# Patient Record
Sex: Female | Born: 1955 | Race: White | Hispanic: No | Marital: Married | Smoking: Current some day smoker
Health system: Southern US, Community
[De-identification: ages and names within clinical notes are randomized; demographics above are authoritative.]

## PROBLEM LIST (undated history)

## (undated) DIAGNOSIS — E039 Hypothyroidism, unspecified: Secondary | ICD-10-CM

## (undated) DIAGNOSIS — K219 Gastro-esophageal reflux disease without esophagitis: Secondary | ICD-10-CM

## (undated) DIAGNOSIS — N3289 Other specified disorders of bladder: Secondary | ICD-10-CM

## (undated) DIAGNOSIS — M810 Age-related osteoporosis without current pathological fracture: Secondary | ICD-10-CM

## (undated) DIAGNOSIS — L4050 Arthropathic psoriasis, unspecified: Secondary | ICD-10-CM

## (undated) DIAGNOSIS — Z973 Presence of spectacles and contact lenses: Secondary | ICD-10-CM

## (undated) DIAGNOSIS — E785 Hyperlipidemia, unspecified: Secondary | ICD-10-CM

## (undated) DIAGNOSIS — R011 Cardiac murmur, unspecified: Secondary | ICD-10-CM

## (undated) DIAGNOSIS — Z8489 Family history of other specified conditions: Secondary | ICD-10-CM

## (undated) DIAGNOSIS — K5730 Diverticulosis of large intestine without perforation or abscess without bleeding: Secondary | ICD-10-CM

## (undated) DIAGNOSIS — I350 Nonrheumatic aortic (valve) stenosis: Secondary | ICD-10-CM

## (undated) DIAGNOSIS — R87619 Unspecified abnormal cytological findings in specimens from cervix uteri: Secondary | ICD-10-CM

## (undated) DIAGNOSIS — Z86010 Personal history of colonic polyps: Secondary | ICD-10-CM

## (undated) DIAGNOSIS — D649 Anemia, unspecified: Secondary | ICD-10-CM

## (undated) DIAGNOSIS — L409 Psoriasis, unspecified: Secondary | ICD-10-CM

## (undated) DIAGNOSIS — I10 Essential (primary) hypertension: Secondary | ICD-10-CM

## (undated) DIAGNOSIS — Z860101 Personal history of adenomatous and serrated colon polyps: Secondary | ICD-10-CM

## (undated) DIAGNOSIS — T7840XA Allergy, unspecified, initial encounter: Secondary | ICD-10-CM

## (undated) DIAGNOSIS — M1990 Unspecified osteoarthritis, unspecified site: Secondary | ICD-10-CM

## (undated) DIAGNOSIS — B977 Papillomavirus as the cause of diseases classified elsewhere: Secondary | ICD-10-CM

## (undated) DIAGNOSIS — H269 Unspecified cataract: Secondary | ICD-10-CM

## (undated) HISTORY — PX: ABDOMINAL HYSTERECTOMY: SHX81

## (undated) HISTORY — PX: DENTAL SURGERY: SHX609

## (undated) HISTORY — DX: Diverticulosis of large intestine without perforation or abscess without bleeding: K57.30

## (undated) HISTORY — PX: CATARACT EXTRACTION, BILATERAL: SHX1313

## (undated) HISTORY — PX: KNEE ARTHROPLASTY: SHX992

## (undated) HISTORY — DX: Other specified disorders of bladder: N32.89

## (undated) HISTORY — PX: POLYPECTOMY: SHX149

## (undated) HISTORY — DX: Hyperlipidemia, unspecified: E78.5

## (undated) HISTORY — DX: Arthropathic psoriasis, unspecified: L40.50

## (undated) HISTORY — DX: Hypothyroidism, unspecified: E03.9

## (undated) HISTORY — DX: Age-related osteoporosis without current pathological fracture: M81.0

## (undated) HISTORY — DX: Allergy, unspecified, initial encounter: T78.40XA

## (undated) HISTORY — DX: Unspecified cataract: H26.9

---

## 1998-02-22 ENCOUNTER — Emergency Department (HOSPITAL_COMMUNITY): Admission: EM | Admit: 1998-02-22 | Discharge: 1998-02-22 | Payer: Self-pay | Admitting: Internal Medicine

## 1998-02-22 ENCOUNTER — Encounter: Payer: Self-pay | Admitting: Internal Medicine

## 1998-10-03 ENCOUNTER — Emergency Department (HOSPITAL_COMMUNITY): Admission: EM | Admit: 1998-10-03 | Discharge: 1998-10-03 | Payer: Self-pay | Admitting: Emergency Medicine

## 2000-01-12 ENCOUNTER — Emergency Department (HOSPITAL_COMMUNITY): Admission: EM | Admit: 2000-01-12 | Discharge: 2000-01-12 | Payer: Self-pay | Admitting: *Deleted

## 2007-09-17 ENCOUNTER — Emergency Department (HOSPITAL_COMMUNITY): Admission: EM | Admit: 2007-09-17 | Discharge: 2007-09-18 | Payer: Self-pay | Admitting: Emergency Medicine

## 2008-03-20 ENCOUNTER — Emergency Department (HOSPITAL_COMMUNITY): Admission: EM | Admit: 2008-03-20 | Discharge: 2008-03-21 | Payer: Self-pay | Admitting: Emergency Medicine

## 2008-04-04 ENCOUNTER — Ambulatory Visit (HOSPITAL_COMMUNITY): Admission: RE | Admit: 2008-04-04 | Discharge: 2008-04-04 | Payer: Self-pay | Admitting: Obstetrics and Gynecology

## 2008-04-04 ENCOUNTER — Encounter (INDEPENDENT_AMBULATORY_CARE_PROVIDER_SITE_OTHER): Payer: Self-pay | Admitting: Obstetrics and Gynecology

## 2008-04-04 HISTORY — PX: HYSTEROSCOPY WITH D & C: SHX1775

## 2009-08-16 ENCOUNTER — Emergency Department (HOSPITAL_COMMUNITY): Admission: EM | Admit: 2009-08-16 | Discharge: 2009-08-17 | Payer: Self-pay | Admitting: Emergency Medicine

## 2009-08-17 ENCOUNTER — Emergency Department (HOSPITAL_COMMUNITY): Admission: EM | Admit: 2009-08-17 | Discharge: 2009-08-18 | Payer: Self-pay | Admitting: Emergency Medicine

## 2009-09-16 ENCOUNTER — Encounter (INDEPENDENT_AMBULATORY_CARE_PROVIDER_SITE_OTHER): Payer: Self-pay | Admitting: *Deleted

## 2009-09-16 ENCOUNTER — Ambulatory Visit: Payer: Self-pay | Admitting: Family Medicine

## 2009-09-26 ENCOUNTER — Encounter: Admission: RE | Admit: 2009-09-26 | Discharge: 2009-09-26 | Payer: Self-pay | Admitting: Family Medicine

## 2009-10-09 ENCOUNTER — Encounter (INDEPENDENT_AMBULATORY_CARE_PROVIDER_SITE_OTHER): Payer: Self-pay | Admitting: *Deleted

## 2009-10-10 ENCOUNTER — Ambulatory Visit: Payer: Self-pay | Admitting: Gastroenterology

## 2009-11-06 ENCOUNTER — Ambulatory Visit: Payer: Self-pay | Admitting: Gastroenterology

## 2009-11-06 HISTORY — PX: COLONOSCOPY: SHX174

## 2009-11-08 ENCOUNTER — Encounter: Payer: Self-pay | Admitting: Gastroenterology

## 2009-12-27 ENCOUNTER — Ambulatory Visit (HOSPITAL_COMMUNITY)
Admission: RE | Admit: 2009-12-27 | Discharge: 2009-12-27 | Payer: Self-pay | Source: Home / Self Care | Attending: Neurological Surgery | Admitting: Neurological Surgery

## 2009-12-27 HISTORY — PX: PERONEAL NERVE DECOMPRESSION: SHX2226

## 2010-02-18 NOTE — Letter (Signed)
Summary: Patient Notice- Polyp Results  [facility]  [address] [place], [place] [address]   Phone: [phone]  Fax: [phone]        November 08, 2009 MRN: [id number]    April Cox [address] [place], [place]  [address]    Dear [name],  I am pleased to inform you that the colon polyp(s) removed during your recent colonoscopy was (were) found to be benign (no cancer detected) upon pathologic examination.  I recommend you have a repeat colonoscopy examination in 3_ years to look for recurrent polyps, as having colon polyps increases your risk for having recurrent polyps or even colon cancer in the future.  Should you develop new or worsening symptoms of abdominal pain, bowel habit changes or bleeding from the rectum or bowels, please schedule an evaluation with either your primary care physician or with me.  Additional information/recommendations:  _X_ No further action with gastroenterology is needed at this time. Please      follow-up with your primary care physician for your other healthcare      needs.  __ Please call [phone] to schedule a return visit to review your      situation.  __ Please keep your follow-up visit as already scheduled.  __ Continue treatment plan as outlined the day of your exam.  Please call us if you are having persistent problems or have questions about your condition that have not been fully answered at this time.  Sincerely,  Mardella Layman MD [facility]  This letter has been electronically signed by your physician.  Appended Document: Patient Notice- Polyp Results letter mailed

## 2010-02-18 NOTE — Procedures (Signed)
Summary: Colonoscopy  Patient: April Cox Note: All result statuses are Final unless otherwise noted.  Tests: (1) Colonoscopy (COL)   COL Colonoscopy           DONE     [facility]     [address]     [place], [place]  [address]           COLONOSCOPY PROCEDURE REPORT           PATIENT:  April Cox, April Cox  MR#:  [id number]     BIRTHDATE:  29-Mar-1955, 54 yrs. old  GENDER:  female     ENDOSCOPIST:  Vania Rea. Jarold Motto, MD, [facility]     REF. BY:     PROCEDURE DATE:  11/06/2009     PROCEDURE:  Colonoscopy with snare polypectomy     ASA CLASS:  Class II     INDICATIONS:  Routine Risk Screening     MEDICATIONS:   Fentanyl 75 mcg IV, Versed 6 mg IV           DESCRIPTION OF PROCEDURE:   After the risks benefits and     alternatives of the procedure were thoroughly explained, informed     consent was obtained.  Digital rectal exam was performed and     revealed no abnormalities.   The [id number] [id number] endoscope was     introduced through the anus and advanced to the cecum, which was     identified by both the appendix and ileocecal valve, without     limitations.  The quality of the prep was excellent, using     MoviPrep.  The instrument was then slowly withdrawn as the colon     was fully examined.     <<PROCEDUREIMAGES>>           FINDINGS:  ULTRASONIC FINDINGS:   A pedunculated polyp was found.     cm stalked polyp hot snare removed from proximal sigmoid ares.#2.     A sessile polyp was found. 5mm sessile polyp at hepatic flexure     removed.#1.  A sessile polyp was found. 3mm distal sigmoid polyp     snare removed.  Moderate diverticulosis was found. thickened,red     haustral folds noted.  This was otherwise a normal examination of     the colon.   Retroflexed views in the rectum revealed no     abnormalities.    The scope was then withdrawn from the patient     and the procedure completed.           COMPLICATIONS:  None     ENDOSCOPIC IMPRESSION:     1) Sessile polyp  2) Pedunculated polyp     3) Sessile polyp     4) Moderate diverticulosis     5) Otherwise normal examination     R/O ADENOMAS.     RECOMMENDATIONS:     1) high fiber diet     2) Repeat Colonoscopy in 3 years.     REPEAT EXAM:  No           ______________________________     Vania Rea. Jarold Motto, MD, [facility]           CC:           n.     eSIGNED:   Vania Rea. Patterson at 11/06/2009 02:08 PM           April Cox, [id number]  Note: An exclamation mark (!) indicates a result that was  not dispersed into the flowsheet. Document Creation Date: 11/06/2009 2:08 PM _______________________________________________________________________  (1) Order result status: Final Collection or observation date-time: 11/06/2009 14:01 Requested date-time:  Receipt date-time:  Reported date-time:  Referring Physician:   Ordering Physician: Sheryn Bison [phone]) Specimen Source:  Source: Launa Grill Order Number: [id number] Lab site:   Appended Document: Colonoscopy 3Y F/U  Appended Document: Colonoscopy     Procedures Next Due Date:    Colonoscopy: 10/2012

## 2010-02-18 NOTE — Miscellaneous (Signed)
Summary: [facility] PREVISIT/PREP  Clinical Lists Changes  Medications: Added new medication of MOVIPREP 100 GM  SOLR (PEG-KCL-NACL-NASULF-NA ASC-C) As per prep instructions. - Signed Rx of MOVIPREP 100 GM  SOLR (PEG-KCL-NACL-NASULF-NA ASC-C) As per prep instructions.;  #1 x 0;  Signed;  Entered by: Wyona Almas RN;  Authorized by: Mardella Layman MD [facility];  Method used: Electronically to [facility] [address], [place], [place], [place]  [address], Ph: [phone], Fax: [phone] Allergies: Added new allergy or adverse reaction of PENICILLIN Added new allergy or adverse reaction of CODEINE Observations: Added new observation of NKA: F (10/10/2009 9:50)    Prescriptions: MOVIPREP 100 GM  SOLR (PEG-KCL-NACL-NASULF-NA ASC-C) As per prep instructions.  #1 x 0   Entered by:   Wyona Almas RN   Authorized by:   Mardella Layman MD [facility]   Signed by:   Wyona Almas RN on 10/10/2009   Method used:   Electronically to        [facility]       [address]       [place], [place]  [address]       Ph: [phone]       Fax: [phone]   RxID:   [id number]

## 2010-02-18 NOTE — Letter (Signed)
Summary: [facility] Instructions  [facility]  [address] [place], [place] [address]   Phone: [phone]  Fax: [phone]       April Cox    06/04/1955    MRN: [id number]        Procedure Day Dorna Bloom:  [facility]  11/06/09     Arrival Time:  12:30PM     Procedure Time:  1:30PM     Location of Procedure:                    _X _   Endoscopy Center (4th Floor)                       PREPARATION FOR COLONOSCOPY WITH MOVIPREP   Starting 5 days prior to your procedure 11/01/09 do not eat nuts, seeds, popcorn, corn, beans, peas,  salads, or any raw vegetables.  Do not take any fiber supplements (e.g. Metamucil, Citrucel, and Benefiber).  THE DAY BEFORE YOUR PROCEDURE         DATE: 11/05/09   DAY: TUESDAY  1.  Drink clear liquids the entire day-NO SOLID FOOD  2.  Do not drink anything colored red or purple.  Avoid juices with pulp.  No orange juice.  3.  Drink at least 64 oz. (8 glasses) of fluid/clear liquids during the day to prevent dehydration and help the prep work efficiently.  CLEAR LIQUIDS INCLUDE: Water Jello Ice Popsicles Tea (sugar ok, no milk/cream) Powdered fruit flavored drinks Coffee (sugar ok, no milk/cream) Gatorade Juice: apple, white grape, white cranberry  Lemonade Clear bullion, consomm, broth Carbonated beverages (any kind) Strained chicken noodle soup Hard Candy                             4.  In the morning, mix first dose of MoviPrep solution:    Empty 1 Pouch A and 1 Pouch B into the disposable container    Add lukewarm drinking water to the top line of the container. Mix to dissolve    Refrigerate (mixed solution should be used within 24 hrs)  5.  Begin drinking the prep at 5:00 p.m. The MoviPrep container is divided by 4 marks.   Every 15 minutes drink the solution down to the next mark (approximately 8 oz) until the full liter is complete.   6.  Follow completed prep with 16 oz of clear liquid of your choice  (Nothing red or purple).  Continue to drink clear liquids until bedtime.  7.  Before going to bed, mix second dose of MoviPrep solution:    Empty 1 Pouch A and 1 Pouch B into the disposable container    Add lukewarm drinking water to the top line of the container. Mix to dissolve    Refrigerate  THE DAY OF YOUR PROCEDURE      DATE: 11/06/09  DAY: WEDNESDAY  Beginning at 8:30AM (5 hours before procedure):         1. Every 15 minutes, drink the solution down to the next mark (approx 8 oz) until the full liter is complete.  2. Follow completed prep with 16 oz. of clear liquid of your choice.    3. You may drink clear liquids until 11:30AM (2 HOURS BEFORE PROCEDURE).   MEDICATION INSTRUCTIONS  Unless otherwise instructed, you should take regular prescription medications with a small sip of water   as early as possible the morning  of your procedure.        OTHER INSTRUCTIONS  You will need a responsible adult at least 57 years of age to accompany you and drive you home.   This person must remain in the waiting room during your procedure.  Wear loose fitting clothing that is easily removed.  Leave jewelry and other valuables at home.  However, you may wish to bring a book to read or  an iPod/MP3 player to listen to music as you wait for your procedure to start.  Remove all body piercing jewelry and leave at home.  Total time from sign-in until discharge is approximately 2-3 hours.  You should go home directly after your procedure and rest.  You can resume normal activities the  day after your procedure.  The day of your procedure you should not:   Drive   Make legal decisions   Operate machinery   Drink alcohol   Return to work  You will receive specific instructions about eating, activities and medications before you leave.    The above instructions have been reviewed and explained to me by   Wyona Almas RN  October 10, 2009 10:20 AM     I fully  understand and can verbalize these instructions _____________________________ Date _________

## 2010-02-18 NOTE — Letter (Signed)
Summary: Previsit letter  [facility]  [address] [place], [place] [address]   Phone: [phone]  Fax: [phone]       09/16/2009 MRN: [id number]  April Cox [address] [place], [place]  [address]  Dear [name],  Welcome to the Gastroenterology Division at [facility].    You are scheduled to see a nurse for your pre-procedure visit on 09/19/2009 at 3:30PM on the 3rd floor at [facility], [address]  We ask that you try to arrive at our office 15 minutes prior to your appointment time to allow for check-in.  Your nurse visit will consist of discussing your medical and surgical history, your immediate family medical history, and your medications.    Please bring a complete list of all your medications or, if you prefer, bring the medication bottles and we will list them.  We will need to be aware of both prescribed and over the counter drugs.  We will need to know exact dosage information as well.  If you are on blood thinners (Coumadin, Plavix, Aggrenox, Ticlid, etc.) please call our office today/prior to your appointment, as we need to consult with your physician about holding your medication.   Please be prepared to read and sign documents such as consent forms, a financial agreement, and acknowledgement forms.  If necessary, and with your consent, a friend or relative is welcome to sit-in on the nurse visit with you.  Please bring your insurance card so that we may make a copy of it.  If your insurance requires a referral to see a specialist, please bring your referral form from your primary care physician.  No co-pay is required for this nurse visit.     If you cannot keep your appointment, please call [phone] to cancel or reschedule prior to your appointment date.  This allows [place] the opportunity to schedule an appointment for another patient in need of care.    Thank you for choosing [facility] for your medical needs.   We appreciate the opportunity to care for you.  Please visit [place] at our website  to learn more about our practice.                     Sincerely.                                                                                                                   The Gastroenterology Division

## 2010-04-01 LAB — DIFFERENTIAL
Basophils Absolute: 0 10*3/uL (ref 0.0–0.1)
Lymphocytes Relative: 28 % (ref 12–46)
Monocytes Absolute: 0.4 10*3/uL (ref 0.1–1.0)
Monocytes Relative: 3 % (ref 3–12)
Neutro Abs: 6.9 10*3/uL (ref 1.7–7.7)
Neutrophils Relative %: 67 % (ref 43–77)

## 2010-04-01 LAB — BASIC METABOLIC PANEL
GFR calc non Af Amer: >60 mL/min (ref >60)
Potassium: 4.8 mEq/L (ref 3.5–5.1)
Sodium: 137 mEq/L (ref 135–145)

## 2010-04-01 LAB — CBC
HCT: 46.4 % — ABNORMAL HIGH (ref 36.0–46.0)
Hemoglobin: 16.6 g/dL — ABNORMAL HIGH (ref 12.0–15.0)
RDW: 11.8 % (ref 11.5–15.5)
WBC: 10.3 10*3/uL (ref 4.0–10.5)

## 2010-04-01 LAB — PROTIME-INR: INR: 0.87 (ref 0.00–1.49)

## 2010-04-01 LAB — SURGICAL PCR SCREEN: Staphylococcus aureus: POSITIVE — AB

## 2010-04-01 LAB — APTT: aPTT: 35 seconds (ref 24–37)

## 2010-05-01 LAB — DIFFERENTIAL
Basophils Absolute: 0.1 10*3/uL (ref 0.0–0.1)
Basophils Relative: 2 % — ABNORMAL HIGH (ref 0–1)
Eosinophils Relative: 2 % (ref 0–5)
Lymphocytes Relative: 36 % (ref 12–46)
Monocytes Absolute: 0.3 10*3/uL (ref 0.1–1.0)
Neutro Abs: 4.8 10*3/uL (ref 1.7–7.7)

## 2010-05-01 LAB — URINE MICROSCOPIC-ADD ON

## 2010-05-01 LAB — COMPREHENSIVE METABOLIC PANEL
ALT: 13 U/L (ref 0–35)
AST: 16 U/L (ref 0–37)
Albumin: 4 g/dL (ref 3.5–5.2)
CO2: 22 mEq/L (ref 19–32)
Calcium: 9.1 mg/dL (ref 8.4–10.5)
GFR calc Af Amer: >60 mL/min (ref >60)
GFR calc non Af Amer: >60 mL/min (ref >60)
Sodium: 134 mEq/L — ABNORMAL LOW (ref 135–145)
Total Protein: 7 g/dL (ref 6.0–8.3)

## 2010-05-01 LAB — CBC
HCT: 26.4 % — ABNORMAL LOW (ref 36.0–46.0)
Hemoglobin: 8.7 g/dL — ABNORMAL LOW (ref 12.0–15.0)
MCHC: 32.8 g/dL (ref 30.0–36.0)
MCHC: 32.9 g/dL (ref 30.0–36.0)
Platelets: 317 10*3/uL (ref 150–400)
Platelets: 282 10*3/uL (ref 150–400)
RBC: 3.36 MIL/uL — ABNORMAL LOW (ref 3.87–5.11)
RDW: 15.9 % — ABNORMAL HIGH (ref 11.5–15.5)

## 2010-05-01 LAB — BASIC METABOLIC PANEL
BUN: 5 mg/dL — ABNORMAL LOW (ref 6–23)
CO2: 22 mEq/L (ref 19–32)
Calcium: 8.8 mg/dL (ref 8.4–10.5)
GFR calc non Af Amer: >60 mL/min (ref >60)
Glucose, Bld: 107 mg/dL — ABNORMAL HIGH (ref 70–99)

## 2010-05-01 LAB — URINALYSIS, ROUTINE W REFLEX MICROSCOPIC
Leukocytes, UA: NEGATIVE
Nitrite: NEGATIVE
Specific Gravity, Urine: >1.03 — ABNORMAL HIGH (ref 1.005–1.030)
Urobilinogen, UA: 0.2 mg/dL (ref 0.0–1.0)
pH: 5.5 (ref 5.0–8.0)

## 2010-05-01 LAB — SAMPLE TO BLOOD BANK

## 2010-05-01 LAB — T4, FREE: Free T4: 0.99 ng/dL (ref 0.89–1.80)

## 2010-05-01 LAB — TSH: TSH: 4.362 u[IU]/mL (ref 0.350–4.500)

## 2010-06-03 NOTE — Op Note (Signed)
April Cox, April Cox                ACCOUNT NO.:  [id number]   MEDICAL RECORD NO.:  [id number]          PATIENT TYPE:  AMB   LOCATION:  SDC                           FACILITY:  [facility]   PHYSICIAN:  Crist Fat. Rivard, M.D. DATE OF BIRTH:  11/07/1955   DATE OF PROCEDURE:  04/04/2008  DATE OF DISCHARGE:                               OPERATIVE REPORT   PREOPERATIVE DIAGNOSES:  Menorrhagia, anemia, endometrial hyperplasia.   POSTOPERATIVE DIAGNOSES:  Menorrhagia, anemia, endometrial hyperplasia.   ANESTHESIA:  General, Dr. Jean Rosenthal.   PROCEDURE:  D&C with hysteroscopy.   SURGEON:  Crist Fat. Rivard, MD   ESTIMATED BLOOD LOSS:  Minimal.   PROCEDURE:  After being informed of the planned procedure with possible  complications including bleeding, infection, and injury to uterus,  informed consent was obtained.  The patient was taken to OR #8, given  general anesthesia with laryngeal mask ventilation without complication.  She was placed in the lithotomy position, prepped, and draped in a  sterile fashion.  Her bladder was emptied with an in-and-out red rubber  catheter.  Pelvic exam reveals an anteverted uterus normal in size and  shape, and two normal adnexa.  A weighted speculum was inserted.  Anterior lip of the cervix was grasped with a tenaculum forceps and the  uterus was sounded at 9 cm.  The cervix was easily dilated using Hegar  dilator until #29 which allows [place] to proceed with the curettage of the  endometrial cavity using a medium size curette.  This was done in  multiple passes leading to the removal of a significant amount of  endometrium.  At the end of the curettage, the endometrial cavity  appears free of tissue and there was no excessive bleeding, so we went  ahead and proceeded with hysteroscopy to visualize the endometrial  cavity.  The diagnostic hysteroscope was easily inserted in the cavity  and with perfusion of the sorbitol at a maximum pressure of 70 mmHg, we  were  able to visualize the endometrial cavity which revealed some  residual fluffiness from endometrial remnants.  No evidence of polyp and  no evidence of other endometrial pathology.  To note, there was no  evidence of active bleeding either.   We removed the hysteroscope.  To be noted, we needed to clamp the cervix  down around the hysteroscope to maintain some kind of endometrial  pressure with two tenaculum forceps.   The instruments were removed and bleeding from the tenaculum forceps was  controlled with application of silver nitrate and pressure applied to  the cervix.  Instruments and sponge count is complete x2.  Estimated  blood loss was  minimal, fluid deficit 0, and the procedure was very well tolerated by  the patient who was taken to recovery room in a well and stable  condition.   SPECIMEN:  Endometrial curettings sent to Pathology.      Crist Fat Rivard, M.D.  Electronically Signed     SAR/MEDQ  D:  04/04/2008  T:  04/05/2008  Job:  [id number]

## 2010-06-03 NOTE — H&P (Signed)
NAMEMEGGAN, April Cox                ACCOUNT NO.:  [id number]   MEDICAL RECORD NO.:  [id number]           PATIENT TYPE:   LOCATION:                                 FACILITY:   PHYSICIAN:  Janine Limbo, M.D.DATE OF BIRTH:  1955-10-18   DATE OF ADMISSION:  04/04/2008  DATE OF DISCHARGE:                              HISTORY & PHYSICAL   HISTORY OF PRESENT ILLNESS:  April Cox is a 55 year old female, para 1-  0-1-1, who presents for dilatation and curettage and possible  hysteroscopy.  The patient has been followed at the [facility].  The patient  complains of heavy and irregular menstrual periods.  The patient reports  that she had normal cycles for 2-3 months prior to this episode but that  since February 2010, her cycles have been extremely heavy.  She was  evaluated by her primary physician and at the emergency department at  [facility].  Her hemoglobin was noted to be 8.7.  She has had  episodes of dizziness in the past and she does feel weak.  The patient  had an endometrial biopsy performed in March 2010 that showed simple  hyperplasia.  An ultrasound was performed that showed a uterus that  measured 10.9 cm x 7.9 cm.  The endometrium measured 1.34 cm.  The  ovaries appeared normal.  A hydrosonogram was performed which showed  three endometrial masses, consistent with endometrial polyps.  The  largest polyp measured 2.04 x 0.95 cm.  Her prolactin was within normal  limits.  Her TSH was 4.957 (slightly high).  The patient's husband has  had a vasectomy.  The patient has had a dilatation and curettage in the  past.  Her most recent Pap smear was within normal limits.   OBSTETRICAL HISTORY:  The patient has had one full-term vaginal  delivery.  She has had one miscarriage.   PAST MEDICAL HISTORY:  The patient had gestational hypertension.  She  has a known history of anemia, as mentioned in the history of  present  illness.  She has had dental work, but no other surgeries.   SOCIAL HISTORY:  The patient has been married for the past year.  She  smokes one half pack of cigarettes each day.  She drinks alcohol  socially.  She denies other recreational drug uses.   REVIEW OF SYSTEMS:  Please see history of present illness.  The patient  denies nausea, vomiting, chills and fever.   FAMILY HISTORY:  There is a family history of hypothyroidism,  hypertension, and kidney disorders.   PHYSICAL EXAM:  Weight is 174 pounds.  Height is 5 feet 6 inches.  HEENT:  Within normal limits.  CHEST:  Clear.  HEART:  Regular rate and rhythm.  ABDOMEN:  Soft and nontender.  There is no guarding or rebound.  EXTREMITIES:  Grossly normal.  NEUROLOGIC EXAM:  Grossly normal.  PELVIC EXAM:  External genitalia is normal.  Vagina is normal.  There is  some relaxation present.  Cervix is nontender.  Uterus is  normal size,  shape and consistency.  The uterus is nontender.  Adnexa:  No masses are  appreciated.   ASSESSMENT:  1. Menometrorrhagia.  2. Severe anemia.  3. Hypothyroidism.  4. Dysmenorrhea.  5. Endometrial polyps.   PLAN:  1. The patient will undergo a dilatation and curettage.  We will      perform hysteroscopy, if possible.  The patient understands the      indications for surgical procedure as well as the alternative      treatment options.  We discussed progestin therapy, but the patient      feels that her bleeding has progressed to the point that she does      not want to wait for that.  She accepts the risks of, but not      limited to, anesthetic complications, bleeding, infection, and      possible damage to surrounding organs.  2. I have called prescriptions for Motrin 800 mg every 8 hours as      needed for moderate pain to [facility] on [address].  I      have also called in prescriptions for Provera 10 mg tablets 2      tablets each day.  I have called in prescription for  Synthroid 25      mcg each day.  3. The patient will return to see Dr. Stefano Gaul or Dr. Estanislado Pandy in 1-2      weeks for follow-up examination.  4. Preoperative orders have been called to the [facility]      [place], including a complete hypothyroid panel, a comprehensive      metabolic panel, and a CBC.      Janine Limbo, M.D.  Electronically Signed     AVS/MEDQ  D:  04/04/2008  T:  04/04/2008  Job:  [id number]

## 2010-08-01 ENCOUNTER — Other Ambulatory Visit (INDEPENDENT_AMBULATORY_CARE_PROVIDER_SITE_OTHER): Payer: BLUE CROSS/BLUE SHIELD

## 2010-08-01 DIAGNOSIS — Z299 Encounter for prophylactic measures, unspecified: Secondary | ICD-10-CM

## 2010-08-01 DIAGNOSIS — Z23 Encounter for immunization: Secondary | ICD-10-CM

## 2010-08-20 ENCOUNTER — Encounter: Payer: Self-pay | Admitting: Family Medicine

## 2010-08-22 ENCOUNTER — Ambulatory Visit (INDEPENDENT_AMBULATORY_CARE_PROVIDER_SITE_OTHER): Payer: BLUE CROSS/BLUE SHIELD | Admitting: Medical

## 2010-08-22 ENCOUNTER — Encounter: Payer: Self-pay | Admitting: Medical

## 2010-08-22 DIAGNOSIS — L4050 Arthropathic psoriasis, unspecified: Secondary | ICD-10-CM | POA: Insufficient documentation

## 2010-08-22 DIAGNOSIS — Z0000 Encounter for general adult medical examination without abnormal findings: Secondary | ICD-10-CM

## 2010-08-22 DIAGNOSIS — E039 Hypothyroidism, unspecified: Secondary | ICD-10-CM | POA: Insufficient documentation

## 2010-08-22 DIAGNOSIS — R011 Cardiac murmur, unspecified: Secondary | ICD-10-CM

## 2010-08-22 DIAGNOSIS — E785 Hyperlipidemia, unspecified: Secondary | ICD-10-CM | POA: Insufficient documentation

## 2010-08-22 DIAGNOSIS — F17200 Nicotine dependence, unspecified, uncomplicated: Secondary | ICD-10-CM

## 2010-08-22 LAB — LIPID PANEL
LDL Cholesterol: 119 mg/dL — ABNORMAL HIGH (ref 0–99)
Triglycerides: 155 mg/dL — ABNORMAL HIGH (ref <150)
VLDL: 31 mg/dL (ref 0–40)

## 2010-08-22 NOTE — Patient Instructions (Signed)
Recheck here in 4-6 months on heart murmur.  We will plan to do an ultrasound of the heart within the next 6 months.  We will request records from your gynecologist and rheumatologists.  Stop smoking!  Preventative Care for Adults - Female      MAINTAIN REGULAR HEALTH EXAMS:  A routine yearly physical is a good way to check in with your primary care provider about your health and preventive screening. It is also an opportunity to share updates about your health and any concerns you have, and receive a thorough all-over exam.   Most health insurance companies pay for at least some preventative services.  Check with your health plan for specific coverages.  WHAT PREVENTATIVE SERVICES DO WOMEN NEED?  Adult women should have their weight and blood pressure checked regularly.   Women age 50 and older should have their cholesterol levels checked regularly.  Women should be screened for cervical cancer with a Pap smear and pelvic exam beginning at either age 16, or 3 years after they become sexually activity.    Breast cancer screening generally begins at age 25 with a mammogram and breast exam by your primary care provider.    Beginning at age 83 and continuing to age 50, women should be screened for colorectal cancer.  Certain people may need continued testing until age 32.  Updating vaccinations is part of preventative care.  Vaccinations help protect against diseases such as the flu.  Osteoporosis is a disease in which the bones lose minerals and strength as we age. Women ages 47 and over should discuss this with their caregivers, as should women after menopause who have other risk factors.  Lab tests are generally done as part of preventative care to screen for anemia and blood disorders, to screen for problems with the kidneys and liver, to screen for bladder problems, to check blood sugar, and to check your cholesterol level.  Preventative services generally include counseling about  diet, exercise, avoiding tobacco, drugs, excessive alcohol consumption, and sexually transmitted infections.    GENERAL RECOMMENDATIONS FOR GOOD HEALTH:  Healthy diet:  Eat a variety of foods, including fruit, vegetables, animal or vegetable protein, such as meat, fish, chicken, and eggs, or beans, lentils, tofu, and grains, such as rice.  Drink plenty of water daily.  Decrease saturated fat in the diet, avoid lots of red meat, processed foods, sweets, fast foods, and fried foods.  Exercise:  Aerobic exercise helps maintain good heart health. At least 30-40 minutes of moderate-intensity exercise is recommended. For example, a brisk walk that increases your heart rate and breathing. This should be done on most days of the week.   Find a type of exercise or a variety of exercises that you enjoy so that it becomes a part of your daily life.  Examples are running, walking, swimming, water aerobics, and biking.  For motivation and support, explore group exercise such as aerobic class, spin class, Zumba, Yoga,or  martial arts, etc.    Set exercise goals for yourself, such as a certain weight goal, walk or run in a race such as a 5k walk/run.  Speak to your primary care provider about exercise goals.  Disease prevention:  If you smoke or chew tobacco, find out from your caregiver how to quit. It can literally save your life, no matter how long you have been a tobacco user. If you do not use tobacco, never begin.   Maintain a healthy diet and normal weight. Increased weight leads  to problems with blood pressure and diabetes.   The Body Mass Index or BMI is a way of measuring how much of your body is fat. Having a BMI above 27 increases the risk of heart disease, diabetes, hypertension, stroke and other problems related to obesity. Your caregiver can help determine your BMI and based on it develop an exercise and dietary program to help you achieve or maintain this important measurement at a  healthful level.  High blood pressure causes heart and blood vessel problems.  Persistent high blood pressure should be treated with medicine if weight loss and exercise do not work.   Fat and cholesterol leaves deposits in your arteries that can block them. This causes heart disease and vessel disease elsewhere in your body.  If your cholesterol is found to be high, or if you have heart disease or certain other medical conditions, then you may need to have your cholesterol monitored frequently and be treated with medication.   Ask if you should have a cardiac stress test if your history suggests this. A stress test is a test done on a treadmill that looks for heart disease. This test can find disease prior to there being a problem.  Menopause can be associated with physical symptoms and risks. Hormone replacement therapy is available to decrease these. You should talk to your caregiver about whether starting or continuing to take hormones is right for you.   Osteoporosis is a disease in which the bones lose minerals and strength as we age. This can result in serious bone fractures. Risk of osteoporosis can be identified using a bone density scan. Women ages 70 and over should discuss this with their caregivers, as should women after menopause who have other risk factors. Ask your caregiver whether you should be taking a calcium supplement and Vitamin D, to reduce the rate of osteoporosis.   Avoid drinking alcohol in excess (more than two drinks per day).  Avoid use of street drugs. Do not share needles with anyone. Ask for professional help if you need assistance or instructions on stopping the use of alcohol, cigarettes, and/or drugs.  Brush your teeth twice a day with fluoride toothpaste, and floss once a day. Good oral hygiene prevents tooth decay and gum disease. The problems can be painful, unattractive, and can cause other health problems. Visit your dentist for a routine oral and dental check  up and preventive care every 6-12 months.   Look at your skin regularly.  Use a mirror to look at your back. Notify your caregivers of changes in moles, especially if there are changes in shapes, colors, a size larger than a pencil eraser, an irregular border, or development of new moles.  Safety:  Use seatbelts 100% of the time, whether driving or as a passenger.  Use safety devices such as hearing protection if you work in environments with loud noise or significant background noise.  Use safety glasses when doing any work that could send debris in to the eyes.  Use a helmet if you ride a bike or motorcycle.  Use appropriate safety gear for contact sports.  Talk to your caregiver about gun safety.  Use sunscreen with a SPF (or skin protection factor) of 15 or greater.  Lighter skinned people are at a greater risk of skin cancer. Don't forget to also wear sunglasses in order to protect your eyes from too much damaging sunlight. Damaging sunlight can accelerate cataract formation.   Practice safe sex. Use condoms. Condoms are  used for birth control and to help reduce the spread of sexually transmitted infections (or STIs).  Some of the STIs are gonorrhea (the clap), chlamydia, syphilis, trichomonas, herpes, HPV (human papilloma virus) and HIV (human immunodeficiency virus) which causes AIDS. The herpes, HIV and HPV are viral illnesses that have no cure. These can result in disability, cancer and death.   Keep carbon monoxide and smoke detectors in your home functioning at all times. Change the batteries every 6 months or use a model that plugs into the wall.   Vaccinations:  Stay up to date with your tetanus shots and other required immunizations. You should have a booster for tetanus every 10 years. Be sure to get your flu shot every year, since 5%-20% of the [place] population comes down with the flu. The flu vaccine changes each year, so being vaccinated once is not enough. Get your shot in the fall,  before the flu season peaks.   Other vaccines to consider:  Human Papilloma Virus or HPV causes cancer of the cervix, and other infections that can be transmitted from person to person. There is a vaccine for HPV, and females should get immunized between the ages of 23 and 2. It requires a series of 3 shots.   Pneumococcal vaccine to protect against certain types of pneumonia.  This is normally recommended for adults age 30 or older.  However, adults younger than 57 years old with certain underlying conditions such as diabetes, heart or lung disease should also receive the vaccine.  Shingles vaccine to protect against Varicella Zoster if you are older than age 70, or younger than 57 years old with certain underlying illness.  Hepatitis A vaccine to protect against a form of infection of the liver by a virus acquired from food.  Hepatitis B vaccine to protect against a form of infection of the liver by a virus acquired from blood or body fluids, particularly if you work in health care.  If you plan to travel internationally, check with your local health department for specific vaccination recommendations.  Cancer Screening:  Breast cancer screening is essential to preventive care for women. All women age 66 and older should perform a breast self-exam every month. At age 55 and older, women should have their caregiver complete a breast exam each year. Women at ages 35 and older should have a mammogram (x-ray film) of the breasts. Your caregiver can discuss how often you need mammograms.    Cervical cancer screening includes taking a Pap smear (sample of cells examined under a microscope) from the cervix (end of the uterus). It also includes testing for HPV (Human Papilloma Virus, which can cause cervical cancer). Screening and a pelvic exam should begin at age 76, or 3 years after a woman becomes sexually active. Screening should occur every year, with a Pap smear but no HPV testing, up to age 61.  After age 42, you should have a Pap smear every 3 years with HPV testing, if no HPV was found previously.   Most routine colon cancer screening begins at the age of 67. On a yearly basis, doctors may provide special easy to use take-home tests to check for hidden blood in the stool. Sigmoidoscopy or colonoscopy can detect the earliest forms of colon cancer and is life saving. These tests use a small camera at the end of a tube to directly examine the colon. Speak to your caregiver about this at age 18, when routine screening begins (and is repeated every  5 years unless early forms of pre-cancerous polyps or small growths are found).

## 2010-08-22 NOTE — Progress Notes (Signed)
Subjective:   HPI  April Cox is a 55 y.o. female who presents for a complete physical.  She needs cholesterol checked.  She just had a thorough workup and comprehensive labs through Dr. Corliss Skains, and was recently diagnosed with psoriatic and rheumatoid arthritis. She will begin Methotrexate soon.  She will have regular lab monitoring for this.  She was advised to stop tobacco and ETOH which she plans to do.  She is exercising, trying to eat healthy.  No other c/o.   Reviewed their medical, surgical, family, social, medication, and allergy history and updated chart as appropriate.  Past Medical History  Diagnosis Date  . Psoriatic arthritis     Dr. Corliss Skains  . Hyperlipidemia   . Hypothyroidism   . Rheumatoid arthritis     Dr. Corliss Skains  . Diverticulosis of colon     Past Surgical History  Procedure Date  . Peroneal nerve decompression     Dr. Onalee Hua Jones/neurosurgeon  . Dilation and curettage of uterus   . Endometrial ablation   . Colonoscopy 2011    colon polyps    History reviewed. No pertinent family history.  History   Social History  . Marital Status: Married    Spouse Name: N/A    Number of Children: N/A  . Years of Education: N/A   Occupational History  . Not on file.   Social History Main Topics  . Smoking status: Current Everyday Smoker -- 0.5 packs/day    Types: Cigarettes  . Smokeless tobacco: Never Used  . Alcohol Use: 3.5 oz/week    7 drink(s) per week  . Drug Use: No  . Sexually Active: Not on file     exercise - walks, married, Scientist, water quality   Other Topics Concern  . Not on file   Social History Narrative  . No narrative on file    Current Outpatient Prescriptions on File Prior to Visit  Medication Sig Dispense Refill  . atorvastatin (LIPITOR) 40 MG tablet Take 40 mg by mouth daily.        . calcium-vitamin D (OSCAL WITH D) 500-200 MG-UNIT per tablet Take 1 tablet by mouth 2 (two) times daily.        Marland Kitchen EPINEPHrine (EPI-PEN) 0.3  mg/0.3 mL DEVI Inject 0.3 mg into the muscle as needed.        Marland Kitchen glucosamine-chondroitin 500-400 MG tablet Take 1 tablet by mouth 3 (three) times daily.        Marland Kitchen levothyroxine (SYNTHROID, LEVOTHROID) 50 MCG tablet Take 50 mcg by mouth daily.          Allergies  Allergen Reactions  . Codeine     REACTION: severe nausea/vomiting  . Penicillins     REACTION: swelling, rash, dyspnea     Review of Systems Constitutional: denies fever, chills, sweats, unexpected weight change, anorexia, fatigue Allergy: negative; denies recent sneezing, itching, congestion Dermatology: denies changing moles, rash, lumps, new worrisome lesions ENT: no runny nose, ear pain, sore throat, hoarseness, sinus pain, teeth pain, tinnitus, hearing loss, epistaxis Cardiology: denies chest pain, palpitations, edema, orthopnea, paroxysmal nocturnal dyspnea Respiratory: denies cough, shortness of breath, dyspnea on exertion, wheezing, hemoptysis Gastroenterology: denies abdominal pain, nausea, vomiting, diarrhea, constipation, blood in stool, changes in bowel movement, dysphagia Hematology: denies bleeding or bruising problems Musculoskeletal: +joint pains and bony changes in fingers, pain in hips, pain in left foot;  denies myalgias, back pain, neck pain, cramping, gait changes Ophthalmology: denies vision changes, eye redness, itching, discharge Urology: denies dysuria,  difficulty urinating, hematuria, urinary frequency, urgency, incontinence Neurology: no headache, weakness, tingling, numbness, speech abnormality, memory loss, falls, dizziness Psychology: denies depressed mood, agitation, sleep problems     Objective:   Physical Exam  Filed Vitals:   08/22/10 0821  BP: 140/80  Pulse: 88  Temp: 98.2 F (36.8 C)    General appearance: alert, no distress, WD/WN,white female Skin: scattered flat benign appearing macules mainly on torso anterior/posterior and arms HEENT: normocephalic, conjunctiva/corneas  normal, sclerae anicteric, PERRLA, EOMi, nares patent, no discharge or erythema, pharynx normal Oral cavity: MMM, tongue normal, teeth in good repair Neck: supple, no lymphadenopathy, no thyromegaly, no masses, normal ROM, no bruits Chest: non tender, normal shape and expansion Heart: faint 1-II/VI brief systolic murmur best heard in right upper sternal border, otherwise RRR Lungs: CTA bilaterally, no wheezes, rhonchi, or rales Abdomen: +bs, soft, non tender, non distended, no masses, no hepatomegaly, no splenomegaly, no bruits Back: non tender, normal ROM, no scoliosis Musculoskeletal: oblique surgical scars of lateral left knee, otherwise upper extremities non tender, no obvious deformity, normal ROM throughout, lower extremities non tender, no obvious deformity, normal ROM throughout Extremities: no edema, no cyanosis, no clubbing Pulses: 2+ symmetric, upper and lower extremities, normal cap refill Neurological: alert, oriented x 3, CN2-12 intact, strength normal upper extremities and lower extremities, sensation normal throughout, DTRs 2+ throughout, no cerebellar signs, gait normal Breast/pelvic - deferred to gyn Psychiatric: normal affect, behavior normal, pleasant    Assessment :    Encounter Diagnoses  Name Primary?  . General medical examination Yes  . Hyperlipidemia   . Hypothyroidism   . Psoriatic arthritis   . Heart murmur   . Tobacco use disorder       Plan:    Physical exam - discussed healthy lifestyle, diet, exercise, preventative care, vaccinations, and addressed their concerns.  She had recent comprehensive lab work, and we will request copies from Dr. Fatima Sanger office.   She will scheduled her mammogram soon.  She is UTD on pap smear through Dr. Manson Allan.  Colonoscopy was last year.  Repeat 5 years due to polyps and diverticulosis.   Hyperlipidemia - labs today  Hypothyroidism - will request lab records from Dr. Corliss Skains.  Psoriatic arthritis -  records request from Dr. Corliss Skains.  Heart murmur - new on exam today but very faint, mostly in aortic valve region.  She is asymptomatic.  Advised we listen again in 4-6 mo, consider echocardiogram at that time.  She will be having lots of f/u and monitoring for new diagnosis and treatment for psoriatic and rheumatoid arthritis soon and wants to hold off on echo currently.  Tobacco use - stop tobacco.  She is making efforts now to stop completely.

## 2010-08-25 ENCOUNTER — Telehealth: Payer: Self-pay | Admitting: *Deleted

## 2010-08-25 NOTE — Telephone Encounter (Addendum)
Message copied by Dorthula Perfect on Mon Aug 25, 2010  5:05 PM ------      Message from: Jac Canavan      Created: Mon Aug 25, 2010  7:53 AM       LDL bad chol is 119.  Overall the numbers are ok.  C/t Lipitor, c/t efforts to completely stop smoking, avoid lots of red meat, and lets check chol again in 47mo.  We sent request for the other labs, so if she hasn't heard from [place] in 2 wk regarding the other labs, have her call us.   Pt notified of lab results and will call pt when other lab results come in.  Pt was advised to call office if she hasn't heard from [place] in 2 weeks regarding other lab results.  CM, LPN

## 2010-08-27 ENCOUNTER — Telehealth: Payer: Self-pay | Admitting: *Deleted

## 2010-08-27 NOTE — Telephone Encounter (Addendum)
Message copied by Dorthula Perfect on Wed Aug 27, 2010 11:34 AM ------      Message from: Aleen Campi, DAVID S      Created: Wed Aug 27, 2010 10:25 AM       i received and reviewed gyn OV notes, rheumatology OV notes, and CMET and CBC and UA labs which were all normal.  I did not received thyroid lab results.  I think she said it was checked though and was normal.  Lets see her back in 34mo.    Pt informed of above message.  Pt will call back to schedule an appointment for 6 month follow up.  CM, LPN

## 2010-09-16 ENCOUNTER — Telehealth: Payer: Self-pay | Admitting: *Deleted

## 2010-09-16 NOTE — Telephone Encounter (Addendum)
Message copied by Dorthula Perfect on Tue Sep 16, 2010 11:31 AM ------      Message from: Aleen Campi, DAVID S      Created: Mon Sep 15, 2010  5:22 PM       i received BP readings from her, and several were too high.  Have her check BP another 2 weeks at random times, and return these along with nurse visit BP check in 2 wk.  Have her bring her machine to compare to ours.  If BP staying elevated, after 2 more weeks of monitoring this, we will need to see her and discuss further.     Pt was informed to check BP for two more weeks and then return to clinic for nurse visit to check BP.  Pt stated that she had just bought a BP machine and will bring in readings in 2 weeks.  CM,LPN

## 2010-10-03 ENCOUNTER — Telehealth: Payer: Self-pay | Admitting: *Deleted

## 2010-10-03 NOTE — Telephone Encounter (Addendum)
Message copied by Dorthula Perfect on Fri Oct 03, 2010 10:17 AM ------      Message from: Aleen Campi, DAVID S      Created: Fri Oct 03, 2010 10:00 AM       Lets go ahead and see her back for BP and heart murmur.  I have reviewed her recent BP readings.  I want to discuss options for treatment, recheck on the murmur.      Called and spoke with pt.  Pt is scheduled to return on 10-07-10 at 8:15 am for follow up on BP and heart murmur. CM,LPN

## 2010-10-07 ENCOUNTER — Encounter: Payer: Self-pay | Admitting: Medical

## 2010-10-07 ENCOUNTER — Ambulatory Visit (INDEPENDENT_AMBULATORY_CARE_PROVIDER_SITE_OTHER): Payer: BLUE CROSS/BLUE SHIELD | Admitting: Medical

## 2010-10-07 VITALS — BP 130/72 | HR 72 | Temp 98.1°F | Resp 20 | Ht 66.0 in | Wt 137.0 lb

## 2010-10-07 DIAGNOSIS — R011 Cardiac murmur, unspecified: Secondary | ICD-10-CM

## 2010-10-07 DIAGNOSIS — F17200 Nicotine dependence, unspecified, uncomplicated: Secondary | ICD-10-CM

## 2010-10-07 DIAGNOSIS — I10 Essential (primary) hypertension: Secondary | ICD-10-CM

## 2010-10-07 NOTE — Progress Notes (Signed)
Subjective:   HPI April Cox is a 55 y.o. female who presents for recheck on recent elevated BP readings and murmur.    Of note, while patient was here, our computers were down and I could not access any records from recent physical, labs, or other data.    She has been checking her BP at home with her cuff, and she had brought by readings within the past 2 weeks.  Numbers ranged from 125/69 - high SBP of 160 and high DBP of 95.   Most numbers were averaging around 140s/high 70s.   At her recent physical visit she seemed to have a new murmur.   She recently has started methotrexate for psoriatic arthritis.  She denies any chest pain, shortness of breath or DOE.  She is still trying to cut down on tobacco use.  No other c/o.  The following portions of the patient's history were reviewed and updated as appropriate: allergies, current medications, past family history, past medical history, past social history, past surgical history and problem list.  Past Medical History  Diagnosis Date  . Psoriatic arthritis     Dr. Corliss Skains  . Hyperlipidemia   . Hypothyroidism   . Rheumatoid arthritis     Dr. Corliss Skains  . Diverticulosis of colon     Past Surgical History  Procedure Date  . Peroneal nerve decompression     Dr. Onalee Hua Jones/neurosurgeon  . Dilation and curettage of uterus   . Endometrial ablation   . Colonoscopy 2011    colon polyps   Family History  Problem Relation Age of Onset  . Arthritis Mother     OA   . Psoriasis Mother   . Other Father     accidental death/MVA at age 31  . Hypertension Brother   . Other Brother     1 brother drowned  . Hypertension Maternal Grandmother   . Diabetes Maternal Grandmother   . Hypertension Maternal Grandfather   . Heart disease Neg Hx   . Cancer Neg Hx    Review of Systems Gen: no recent wt change, no fever, chills Skin: no rash, skin changes Heart: no cp, palpitations, edema, orthopnea Lungs: no SOB, DOE Abdomen: no pain,  nvd Neuro: no numbness, tingling, weakness     Objective:   Physical Exam  General appearance: alert, no distress, WD/WN, white female Neck: nontender, supple, no thyromegaly, no mass, no bruit, no JVD Lungs: CTA, no rhonchi, rales, wheezes Heart: RRR, II/VI brief systolic murmur best heard in right upper sternal border, but heard in left sternal border as well, otherwise normal S2, no gallop or rub Ext: no edema Pulses 2+ throughout    Assessment :    Encounter Diagnoses  Name Primary?  . Essential hypertension, benign Yes  . Murmur   . Tobacco use disorder      Plan:     Hypertension - new diagnosis today.  Pending echocardiogram, will decide on which antihypertensive to use.   C/t exercise, healthy diet, avoid added salt in diet.   Murmur - will send for echocardiogram  Tobacco use disorder - c/t efforts to stop smoking completley.

## 2010-10-09 ENCOUNTER — Ambulatory Visit (HOSPITAL_COMMUNITY)
Admission: RE | Admit: 2010-10-09 | Discharge: 2010-10-09 | Disposition: A | Discharge status: Home / Self Care | Payer: BC Managed Care – PPO | Source: Ambulatory Visit | Attending: Medical | Admitting: Medical

## 2010-10-09 DIAGNOSIS — R011 Cardiac murmur, unspecified: Secondary | ICD-10-CM | POA: Insufficient documentation

## 2010-10-09 DIAGNOSIS — E785 Hyperlipidemia, unspecified: Secondary | ICD-10-CM | POA: Insufficient documentation

## 2010-10-09 DIAGNOSIS — I079 Rheumatic tricuspid valve disease, unspecified: Secondary | ICD-10-CM | POA: Insufficient documentation

## 2010-10-09 DIAGNOSIS — I359 Nonrheumatic aortic valve disorder, unspecified: Secondary | ICD-10-CM

## 2010-10-09 HISTORY — PX: TRANSTHORACIC ECHOCARDIOGRAM: SHX275

## 2010-10-20 ENCOUNTER — Telehealth: Payer: Self-pay | Admitting: Medical

## 2010-10-20 NOTE — Telephone Encounter (Signed)
Can use OTC Mucinex DM, Coricidin HBP for congestion, could also use Zyrtec.  For pain, I would make sure she clears it with rheumatology, but Tylenol would be fine.  I would avoid NSAIDs like Ibuprofen if possible.    Did she have her echocardiogram?  I don't think I have seen results?  If she has, pls find results for me.

## 2010-10-20 NOTE — Telephone Encounter (Signed)
Patient was notified of OTC medications she should use. CLS

## 2010-10-20 NOTE — Telephone Encounter (Signed)
Pt wants to know what kind of otc decongestant and otc pain reliever she can use with methotrexate. Please call pt.

## 2010-10-21 ENCOUNTER — Telehealth: Payer: Self-pay | Admitting: Medical

## 2010-10-21 NOTE — Telephone Encounter (Signed)
Left msg for pt to call back

## 2010-10-23 ENCOUNTER — Telehealth: Payer: Self-pay | Admitting: Medical

## 2010-10-23 NOTE — Telephone Encounter (Signed)
I called pt and advised of echocardiogram results - mild LVH, EF 65%, calcified annular and mildly thickened mitral leaflets.  Dr. Susann Givens also reviewed results.  We will start her on trial of Bystolic 5mg  daily, samples provided.  Dicussed risks/benefits of meds.  She will recheck in 21mo.

## 2010-10-23 NOTE — Telephone Encounter (Signed)
April Cox spoke with this patient on 10/22/10.

## 2010-11-21 ENCOUNTER — Other Ambulatory Visit: Payer: Self-pay | Admitting: *Deleted

## 2010-11-21 ENCOUNTER — Other Ambulatory Visit: Payer: BC Managed Care – PPO

## 2010-11-21 MED ORDER — NEBIVOLOL HCL 5 MG PO TABS: 5.0000 mg | ORAL_TABLET | Freq: Every day | ORAL | Status: DC

## 2010-11-21 NOTE — Progress Notes (Signed)
Patient came in for 1 month BP check, which was 120/74. She was started on Bystolic 5mg  by Kristian Covey, PA. BP reading taken to Dr.Lalonde, he ok'd me to call in an rx for patient of Bystolic 5mg  to [facility] on [address].

## 2010-12-16 ENCOUNTER — Other Ambulatory Visit: Payer: BC Managed Care – PPO

## 2010-12-19 ENCOUNTER — Other Ambulatory Visit (INDEPENDENT_AMBULATORY_CARE_PROVIDER_SITE_OTHER): Payer: BC Managed Care – PPO

## 2010-12-19 DIAGNOSIS — Z23 Encounter for immunization: Secondary | ICD-10-CM

## 2011-02-10 ENCOUNTER — Ambulatory Visit (INDEPENDENT_AMBULATORY_CARE_PROVIDER_SITE_OTHER): Payer: BC Managed Care – PPO | Admitting: Family Medicine

## 2011-02-10 ENCOUNTER — Encounter: Payer: Self-pay | Admitting: Family Medicine

## 2011-02-10 VITALS — BP 140/90 | HR 55 | Ht 65.5 in | Wt 140.0 lb

## 2011-02-10 DIAGNOSIS — Z79899 Other long term (current) drug therapy: Secondary | ICD-10-CM

## 2011-02-10 DIAGNOSIS — R0789 Other chest pain: Secondary | ICD-10-CM

## 2011-02-10 DIAGNOSIS — L4050 Arthropathic psoriasis, unspecified: Secondary | ICD-10-CM

## 2011-02-10 MED ORDER — OMEPRAZOLE 40 MG PO CPDR: 40.0000 mg | DELAYED_RELEASE_CAPSULE | Freq: Every day | ORAL | Status: DC

## 2011-02-10 NOTE — Progress Notes (Signed)
  Subjective:    Patient ID: April Cox, female    DOB: 10/26/1955, 56 y.o.   MRN: [id number]  HPI She has a three-day history of mid chest tightness but no shortness of breath, diaphoresis, weakness, nausea or vomiting. She does have psoriatic arthritis and is on methotrexate as well as Humira. She does have difficulty when she takes the methotrexate causing some abdominal discomfort but cannot relate this discomfort to taking the medication. Family history for heart disease is negative. She has been under no undue stress.  Review of Systems     Objective:   Physical Exam alert and in no distress. Tympanic membranes and canals are normal. Throat is clear. Tonsils are normal. Neck is supple without adenopathy or thyromegaly. Cardiac exam shows a regular sinus rhythm without murmurs or gallops. Lungs are clear to auscultation. EKG shows no acute changes       Assessment & Plan:  Chest discomfort, etiology unclear. Psoriatic arthritis I will give her Prilosec. Instructed her to be aware of what makes his symptoms better or worse. She will contact me if continued difficulty

## 2011-02-10 NOTE — Patient Instructions (Signed)
Take the Prilosec and see if it helps with her symptoms. If it does not keep track of your symptoms in terms of what when where and why

## 2011-02-19 ENCOUNTER — Other Ambulatory Visit: Payer: Self-pay | Admitting: Family Medicine

## 2011-03-23 ENCOUNTER — Ambulatory Visit (INDEPENDENT_AMBULATORY_CARE_PROVIDER_SITE_OTHER): Payer: BC Managed Care – PPO | Admitting: Medical

## 2011-03-23 ENCOUNTER — Encounter: Payer: Self-pay | Admitting: Medical

## 2011-03-23 VITALS — BP 120/80 | HR 78 | Temp 98.2°F | Resp 16 | Wt 141.0 lb

## 2011-03-23 DIAGNOSIS — L299 Pruritus, unspecified: Secondary | ICD-10-CM

## 2011-03-23 MED ORDER — HYDROXYZINE HCL 25 MG PO TABS: 25.0000 mg | ORAL_TABLET | Freq: Three times a day (TID) | ORAL | Status: AC | PRN

## 2011-03-23 NOTE — Progress Notes (Signed)
  Subjective:   HPI  April Cox is a 56 y.o. female who presents for itching mostly feet and hands that started yesterday, but also has some spots on back and abdominal too now.   About to itch out of her skin.   She was out of town the last 2 weeks, in [place] and [place], stayed in hotels.  No recent new changes - no new skin products, no new medications, no rash, no food changes.  No sick contacts with similar.  No other aggravating or relieving factors.    No other c/o.  The following portions of the patient's history were reviewed and updated as appropriate: allergies, current medications, past family history, past medical history, past social history, past surgical history and problem list.  Past Medical History  Diagnosis Date  . Psoriatic arthritis     Dr. Corliss Skains  . Hyperlipidemia   . Hypothyroidism   . Rheumatoid arthritis     Dr. Corliss Skains  . Diverticulosis of colon     Allergies  Allergen Reactions  . Codeine     REACTION: severe nausea/vomiting  . Penicillins     REACTION: swelling, rash, dyspnea   Review of Systems Gen: no fever, chills, sweats, weight changes Skin: hx/o psoriatic rash, but no recent changes HEENT: negative Herat: negative Lungs: no changes, no SOB GI: no NVD GU: negative ROS reviewed and was negative other than noted in HPI or above.    Objective:   Physical Exam  General appearance: alert, no distress, WD/WN Scalp: normal appearing, no obvious lice or rash Skin: mild erythema of dorsal feet and dorsal hands, but no lesions, no burrows Abdomen: +bs, soft, non tender, non distended, no masses, no hepatomegaly, no splenomegaly Pulses: 2+ symmetric, upper and lower extremities, normal cap refill   Assessment and Plan :     Encounter Diagnosis  Name Primary?  . Pruritic condition Yes   Etiology unclear.  No obvious scabies or lice or obvious rash.  No recent new exposures.  She did have labs drawn this morning per rheum.   She will find out results tomorrow if there were any abnormalities of liver, blood counts, etc.  She will let me know as well.   For now, ice pack to hands/feet for itching, Hydroxyzine sent.

## 2011-03-23 NOTE — Patient Instructions (Signed)
Pruritus  Pruritis is an itch. There are many different problems that can cause an itch. Dry skin is one of the most common causes of itching. Most cases of itching do not require medical attention.  HOME CARE INSTRUCTIONS  Make sure your skin is moistened on a regular basis. A moisturizer that contains petroleum jelly is best for keeping moisture in your skin. If you develop a rash, you may try the following for relief:   Use corticosteroid cream.   Apply cool compresses to the affected areas.   Bathe with Epsom salts or baking soda in the bathwater.   Soak in colloidal oatmeal baths. These are available at your pharmacy.   Apply baking soda paste to the rash. Stir water into baking soda until it reaches a paste-like consistency.   Use an anti-itch lotion.   Take over-the-counter diphenhydramine medicine by mouth as the instructions direct.   Avoid scratching. Scratching may cause the rash to become infected. If itching is very bad, your caregiver may suggest prescription lotions or creams to lessen your symptoms.   Avoid hot showers, which can make itching worse. A cold shower may help with itching as long as you use a moisturizer after the shower.  SEEK MEDICAL CARE IF: The itching does not go away after several days. Document Released: 09/17/2010 Document Revised: 12/25/2010 Document Reviewed: 09/17/2010 Exit[employer] Patient Information 2012 [employer]. 

## 2011-03-24 ENCOUNTER — Telehealth: Payer: Self-pay | Admitting: Family Medicine

## 2011-03-24 NOTE — Telephone Encounter (Signed)
Good to hear.  Is her itching improving?

## 2011-04-16 ENCOUNTER — Ambulatory Visit: Payer: Self-pay | Admitting: Obstetrics and Gynecology

## 2011-05-11 ENCOUNTER — Encounter: Payer: Self-pay | Admitting: Obstetrics and Gynecology

## 2011-05-11 ENCOUNTER — Ambulatory Visit (INDEPENDENT_AMBULATORY_CARE_PROVIDER_SITE_OTHER): Payer: BC Managed Care – PPO | Admitting: Obstetrics and Gynecology

## 2011-05-11 VITALS — BP 104/80 | Resp 16 | Ht 66.0 in | Wt 146.0 lb

## 2011-05-11 DIAGNOSIS — F17210 Nicotine dependence, cigarettes, uncomplicated: Secondary | ICD-10-CM

## 2011-05-11 DIAGNOSIS — M549 Dorsalgia, unspecified: Secondary | ICD-10-CM

## 2011-05-11 DIAGNOSIS — Z124 Encounter for screening for malignant neoplasm of cervix: Secondary | ICD-10-CM

## 2011-05-11 DIAGNOSIS — E039 Hypothyroidism, unspecified: Secondary | ICD-10-CM

## 2011-05-11 DIAGNOSIS — F17200 Nicotine dependence, unspecified, uncomplicated: Secondary | ICD-10-CM | POA: Insufficient documentation

## 2011-05-11 DIAGNOSIS — E785 Hyperlipidemia, unspecified: Secondary | ICD-10-CM

## 2011-05-11 MED ORDER — LEVOTHYROXINE SODIUM 25 MCG PO TABS: 25.0000 ug | ORAL_TABLET | Freq: Every day | ORAL | Status: DC

## 2011-05-11 MED ORDER — ATORVASTATIN CALCIUM 40 MG PO TABS: 40.0000 mg | ORAL_TABLET | Freq: Every day | ORAL | Status: DC

## 2011-05-11 NOTE — Progress Notes (Addendum)
The patient reports:no complaints  Contraception:vasectomy  Last mammogram: was normal May 2012 Last pap: was normal March  2012  GC/Chlamydia cultures offered: declined HIV/RPR/HbsAg offered:  declined HSV 1 and 2 glycoprotein offered: declined  Menstrual cycle regular and monthly: No: ablation done Menstrual flow normal: No: na  Urinary symptoms: none Normal bowel movements: No Reports abuse at home: No  Per pt Pt had Colonoscopy "2010" pt  Stated findings were polyps. And was told to come back in 5 years.  Per pt she  wants pap today.  Subjective:    April Cox is a 56 y.o. female No obstetric history on file. who presents for annual exam.  The patient has no complaints today. Hx of hypothyroidism, hyperlipidemia, arthritis, and back pain.  The following portions of the patient's history were reviewed and updated as appropriate: allergies, current medications, past family history, past medical history, past social history, past surgical history and problem list.  Review of Systems Pertinent items are noted in HPI. Gastrointestinal:No change in bowel habits, no abdominal pain, no rectal bleeding Genitourinary:negative for dysuria, frequency, hematuria, nocturia and urinary incontinence    Objective:     BP 104/80  Resp 16  Wt 146 lb (66.225 kg)  Weight:  Wt Readings from Last 1 Encounters:  05/11/11 146 lb (66.225 kg)     BMI: There is no height on file to calculate BMI. General Appearance: Alert, appropriate appearance for age. No acute distress HEENT: Grossly normal Neck / Thyroid: Supple, no masses, nodes or enlargement Lungs: clear to auscultation bilaterally Back: No CVA tenderness Breast Exam: No dimpling, nipple retraction or discharge. No masses or nodes. and No masses or nodes.No dimpling, nipple retraction or discharge. Cardiovascular: Regular rate and rhythm. S1, S2, no murmur Gastrointestinal: Soft, non-tender, no masses or organomegaly Pelvic  Exam: Vulva and vagina appear normal. Bimanual exam reveals normal uterus and adnexa. Atrphic changes noted. Rectovaginal: normal rectal, no masses Lymphatic Exam: Non-palpable nodes in neck, clavicular, axillary, or inguinal regions Skin: no rash or abnormalities Neurologic: Normal gait and speech, no tremor  Psychiatric: Alert and oriented, appropriate affect.    Urinalysis:Not done      Assessment:    Normal gyn exam Atrphic changes, Arthritis, hypothyroidism, hyperlipidemia, back pain, cig smoker (trying to quit)    Plan:    Follow up in 1 year. Mammogram. Pap smear. Weight loss discussed  Smoking cessation discussed  Follow-up:  for annual exam  AVS

## 2011-05-11 NOTE — Patient Instructions (Signed)
Smoking Cessation This document explains the best ways for you to quit smoking and new treatments to help. It lists new medicines that can double or triple your chances of quitting and quitting for good. It also considers ways to avoid relapses and concerns you may have about quitting, including weight gain. NICOTINE: A POWERFUL ADDICTION If you have tried to quit smoking, you know how hard it can be. It is hard because nicotine is a very addictive drug. For some people, it can be as addictive as heroin or cocaine. Usually, people make 2 or 3 tries, or more, before finally being able to quit. Each time you try to quit, you can learn about what helps and what hurts. Quitting takes hard work and a lot of effort, but you can quit smoking. QUITTING SMOKING IS ONE OF THE MOST IMPORTANT THINGS YOU WILL EVER DO.  You will live longer, feel better, and live better.   The impact on your body of quitting smoking is felt almost immediately:   Within 20 minutes, blood pressure decreases. Pulse returns to its normal level.   After 8 hours, carbon monoxide levels in the blood return to normal. Oxygen level increases.   After 24 hours, chance of heart attack starts to decrease. Breath, hair, and body stop smelling like smoke.   After 48 hours, damaged nerve endings begin to recover. Sense of taste and smell improve.   After 72 hours, the body is virtually free of nicotine. Bronchial tubes relax and breathing becomes easier.   After 2 to 12 weeks, lungs can hold more air. Exercise becomes easier and circulation improves.   Quitting will reduce your risk of having a heart attack, stroke, cancer, or lung disease:   After 1 year, the risk of coronary heart disease is cut in half.   After 5 years, the risk of stroke falls to the same as a nonsmoker.   After 10 years, the risk of lung cancer is cut in half and the risk of other cancers decreases significantly.   After 15 years, the risk of coronary heart  disease drops, usually to the level of a nonsmoker.   If you are pregnant, quitting smoking will improve your chances of having a healthy baby.   The people you live with, especially your children, will be healthier.   You will have extra money to spend on things other than cigarettes.  FIVE KEYS TO QUITTING Studies have shown that these 5 steps will help you quit smoking and quit for good. You have the best chances of quitting if you use them together: 1. Get ready.  2. Get support and encouragement.  3. Learn new skills and behaviors.  4. Get medicine to reduce your nicotine addiction and use it correctly.  5. Be prepared for relapse or difficult situations. Be determined to continue trying to quit, even if you do not succeed at first.  1. GET READY  Set a quit date.   Change your environment.   Get rid of ALL cigarettes, ashtrays, matches, and lighters in your home, car, and place of work.   Do not let people smoke in your home.   Review your past attempts to quit. Think about what worked and what did not.   Once you quit, do not smoke. NOT EVEN A PUFF!  2. GET SUPPORT AND ENCOURAGEMENT Studies have shown that you have a better chance of being successful if you have help. You can get support in many ways.  Tell   your family, friends, and coworkers that you are going to quit and need their support. Ask them not to smoke around you.   Talk to your caregivers (doctor, dentist, nurse, pharmacist, psychologist, and/or smoking counselor).   Get individual, group, or telephone counseling and support. The more counseling you have, the better your chances are of quitting. Programs are available at local hospitals and health centers. Call your local health department for information about programs in your area.   Spiritual beliefs and practices may help some smokers quit.   Quit meters are small computer programs online or downloadable that keep track of quit statistics, such as amount  of "quit-time," cigarettes not smoked, and money saved.   Many smokers find one or more of the many self-help books available useful in helping them quit and stay off tobacco.  3. LEARN NEW SKILLS AND BEHAVIORS  Try to distract yourself from urges to smoke. Talk to someone, go for a walk, or occupy your time with a task.   When you first try to quit, change your routine. Take a different route to work. Drink tea instead of coffee. Eat breakfast in a different place.   Do something to reduce your stress. Take a hot bath, exercise, or read a book.   Plan something enjoyable to do every day. Reward yourself for not smoking.   Explore interactive web-based programs that specialize in helping you quit.  4. GET MEDICINE AND USE IT CORRECTLY Medicines can help you stop smoking and decrease the urge to smoke. Combining medicine with the above behavioral methods and support can quadruple your chances of successfully quitting smoking. The [place] Food and Drug Administration (FDA) has approved 7 medicines to help you quit smoking. These medicines fall into 3 categories.  Nicotine replacement therapy (delivers nicotine to your body without the negative effects and risks of smoking):   Nicotine gum: Available over-the-counter.   Nicotine lozenges: Available over-the-counter.   Nicotine inhaler: Available by prescription.   Nicotine nasal spray: Available by prescription.   Nicotine skin patches (transdermal): Available by prescription and over-the-counter.   Antidepressant medicine (helps people abstain from smoking, but how this works is unknown):   Bupropion sustained-release (SR) tablets: Available by prescription.   Nicotinic receptor partial agonist (simulates the effect of nicotine in your brain):   Varenicline tartrate tablets: Available by prescription.   Ask your caregiver for advice about which medicines to use and how to use them. Carefully read the information on the package.    Everyone who is trying to quit may benefit from using a medicine. If you are pregnant or trying to become pregnant, nursing an infant, you are under age 18, or you smoke fewer than 10 cigarettes per day, talk to your caregiver before taking any nicotine replacement medicines.   You should stop using a nicotine replacement product and call your caregiver if you experience nausea, dizziness, weakness, vomiting, fast or irregular heartbeat, mouth problems with the lozenge or gum, or redness or swelling of the skin around the patch that does not go away.   Do not use any other product containing nicotine while using a nicotine replacement product.   Talk to your caregiver before using these products if you have diabetes, heart disease, asthma, stomach ulcers, you had a recent heart attack, you have high blood pressure that is not controlled with medicine, a history of irregular heartbeat, or you have been prescribed medicine to help you quit smoking.  5. BE PREPARED FOR RELAPSE OR   DIFFICULT SITUATIONS  Most relapses occur within the first 3 months after quitting. Do not be discouraged if you start smoking again. Remember, most people try several times before they finally quit.   You may have symptoms of withdrawal because your body is used to nicotine. You may crave cigarettes, be irritable, feel very hungry, cough often, get headaches, or have difficulty concentrating.   The withdrawal symptoms are only temporary. They are strongest when you first quit, but they will go away within 10 to 14 days.  Here are some difficult situations to watch for:  Alcohol. Avoid drinking alcohol. Drinking lowers your chances of successfully quitting.   Caffeine. Try to reduce the amount of caffeine you consume. It also lowers your chances of successfully quitting.   Other smokers. Being around smoking can make you want to smoke. Avoid smokers.   Weight gain. Many smokers will gain weight when they quit, usually  less than 10 pounds. Eat a healthy diet and stay active. Do not let weight gain distract you from your main goal, quitting smoking. Some medicines that help you quit smoking may also help delay weight gain. You can always lose the weight gained after you quit.   Bad mood or depression. There are a lot of ways to improve your mood other than smoking.  If you are having problems with any of these situations, talk to your caregiver. SPECIAL SITUATIONS AND CONDITIONS Studies suggest that everyone can quit smoking. Your situation or condition can give you a special reason to quit.  Pregnant women/new mothers: By quitting, you protect your baby's health and your own.   Hospitalized patients: By quitting, you reduce health problems and help healing.   Heart attack patients: By quitting, you reduce your risk of a second heart attack.   Lung, head, and neck cancer patients: By quitting, you reduce your chance of a second cancer.   Parents of children and adolescents: By quitting, you protect your children from illnesses caused by secondhand smoke.  QUESTIONS TO THINK ABOUT Think about the following questions before you try to stop smoking. You may want to talk about your answers with your caregiver.  Why do you want to quit?   If you tried to quit in the past, what helped and what did not?   What will be the most difficult situations for you after you quit? How will you plan to handle them?   Who can help you through the tough times? Your family? Friends? Caregiver?   What pleasures do you get from smoking? What ways can you still get pleasure if you quit?  Here are some questions to ask your caregiver:  How can you help me to be successful at quitting?   What medicine do you think would be best for me and how should I take it?   What should I do if I need more help?   What is smoking withdrawal like? How can I get information on withdrawal?  Quitting takes hard work and a lot of effort,  but you can quit smoking. FOR MORE INFORMATION  Smokefree.gov (http://www.smokefree.gov) provides free, accurate, evidence-based information and professional assistance to help support the immediate and long-term needs of people trying to quit smoking. Document Released: 12/30/2000 Document Revised: 12/25/2010 Document Reviewed: 10/22/2008 Exit[employer] Patient Information 2012 [employer]. 

## 2011-05-12 LAB — PAP IG W/ RFLX HPV ASCU

## 2011-05-25 ENCOUNTER — Other Ambulatory Visit: Payer: Self-pay | Admitting: Family Medicine

## 2011-07-16 ENCOUNTER — Ambulatory Visit (INDEPENDENT_AMBULATORY_CARE_PROVIDER_SITE_OTHER): Payer: BC Managed Care – PPO | Admitting: Family Medicine

## 2011-07-16 ENCOUNTER — Encounter: Payer: Self-pay | Admitting: Family Medicine

## 2011-07-16 VITALS — BP 140/80 | HR 66 | Wt 148.0 lb

## 2011-07-16 DIAGNOSIS — E039 Hypothyroidism, unspecified: Secondary | ICD-10-CM

## 2011-07-16 DIAGNOSIS — Z79899 Other long term (current) drug therapy: Secondary | ICD-10-CM

## 2011-07-16 DIAGNOSIS — L4050 Arthropathic psoriasis, unspecified: Secondary | ICD-10-CM

## 2011-07-16 LAB — THYROID PANEL WITH TSH
T3 Uptake: 40.2 % — ABNORMAL HIGH (ref 22.5–37.0)
T4, Total: 9.2 ug/dL (ref 5.0–12.5)

## 2011-07-16 NOTE — Progress Notes (Signed)
  Subjective:    Patient ID: April Cox, female    DOB: 1955/06/02, 56 y.o.   MRN: [id number]  HPI She has noted over the last month or 2 a significant weight gain. She is on low-dose thyroid placed on it by her gynecologist several years ago. She has noted no skin changes but does think her hair is falling out. She has had no mental changes, GI symptoms. She has not had a followup TSH in well over a year. She is seen on a regular basis by her rheumatologist and gets routine blood work because of her medications.   Review of Systems     Objective:   Physical Exam        Assessment & Plan:   1. Hypothyroidism  Thyroid Panel with TSH, Thyroid Panel With TSH  2. Psoriatic arthritis    3. Encounter for long-term (current) use of other medications

## 2011-08-06 ENCOUNTER — Other Ambulatory Visit: Payer: Self-pay | Admitting: Medical

## 2011-09-03 ENCOUNTER — Other Ambulatory Visit: Payer: Self-pay | Admitting: Neurological Surgery

## 2011-12-01 ENCOUNTER — Other Ambulatory Visit (INDEPENDENT_AMBULATORY_CARE_PROVIDER_SITE_OTHER): Payer: BC Managed Care – PPO

## 2011-12-01 DIAGNOSIS — Z23 Encounter for immunization: Secondary | ICD-10-CM

## 2011-12-15 ENCOUNTER — Encounter (HOSPITAL_COMMUNITY): Payer: Self-pay

## 2011-12-15 ENCOUNTER — Encounter (HOSPITAL_COMMUNITY)
Admission: RE | Admit: 2011-12-15 | Discharge: 2011-12-15 | Disposition: A | Discharge status: Home / Self Care | Payer: BC Managed Care – PPO | Source: Ambulatory Visit | Attending: Neurological Surgery | Admitting: Neurological Surgery

## 2011-12-15 ENCOUNTER — Other Ambulatory Visit: Payer: Self-pay | Admitting: Neurological Surgery

## 2011-12-15 ENCOUNTER — Ambulatory Visit (HOSPITAL_COMMUNITY)
Admission: RE | Admit: 2011-12-15 | Discharge: 2011-12-15 | Disposition: A | Discharge status: Home / Self Care | Payer: BC Managed Care – PPO | Source: Ambulatory Visit | Attending: Anesthesiology | Admitting: Anesthesiology

## 2011-12-15 DIAGNOSIS — I10 Essential (primary) hypertension: Secondary | ICD-10-CM | POA: Insufficient documentation

## 2011-12-15 HISTORY — DX: Gastro-esophageal reflux disease without esophagitis: K21.9

## 2011-12-15 LAB — BASIC METABOLIC PANEL
BUN: 11 mg/dL (ref 6–23)
CO2: 28 mEq/L (ref 19–32)
Calcium: 9.9 mg/dL (ref 8.4–10.5)
Creatinine, Ser: 0.71 mg/dL (ref 0.50–1.10)
GFR calc non Af Amer: >90 mL/min (ref >90)
Glucose, Bld: 96 mg/dL (ref 70–99)

## 2011-12-15 LAB — HCG, SERUM, QUALITATIVE: Preg, Serum: NEGATIVE

## 2011-12-15 LAB — CBC
MCH: 33.1 pg (ref 26.0–34.0)
MCHC: 34.4 g/dL (ref 30.0–36.0)
MCV: 96.3 fL (ref 78.0–100.0)
Platelets: 236 10*3/uL (ref 150–400)

## 2011-12-15 NOTE — Pre-Procedure Instructions (Addendum)
[name]  12/15/2011   Your procedure is scheduled on: 12/25/11  Report to [facility] at 530* AM.  Call this number if you have problems the morning of surgery: [phone]   Remember:   Do not eat foodor drink:After Midnight.    Take these medicines the morning of surgery with A SIP OF WATER: bystolic, levothyroxine,omeprazole  STOP fish oil sat 12/19/11   Do not wear jewelry, make-up or nail polish.  Do not wear lotions, powders, or perfumes. You may not wear deodorant.  Do not shave 48 hours prior to surgery. Men may shave face and neck.  Do not bring valuables to the hospital.  Contacts, dentures or bridgework may not be worn into surgery.  Leave suitcase in the car. After surgery it may be brought to your room.  For patients admitted to the hospital, checkout time is 11:00 AM the day of discharge.   Patients discharged the day of surgery will not be allowed to drive home.  Name and phone number of your driver: jon [phone] spouse  Special Instructions: Shower using CHG 2 nights before surgery and the night before surgery.  If you shower the day of surgery use CHG.  Use special wash - you have one bottle of CHG for all showers.  You should use approximately 1/3 of the bottle for each shower.   Please read over the following fact sheets that you were given: Pain Booklet, Coughing and Deep Breathing, MRSA Information and Surgical Site Infection Prevention

## 2011-12-15 NOTE — Progress Notes (Addendum)
Echo 9/12 in epic Called office for orders. spoke with vanessa.

## 2011-12-16 NOTE — Consult Note (Signed)
Anesthesia chart review: Patient is a 56 year old female posted for one level anterior lateral lumbar fusion by Dr. Yetta Barre on 12/25/2011. History includes smoking, psoriatic arthritis, hypothyroidism, hyperlipidemia, rheumatoid arthritis, GERD.  She had an echo on 10/09/10 that showed "very mild aortic stenosis."  EKG on 12/15/2011 showed normal sinus rhythm.  Echo on 10/09/10 showed: Mild LVH, normal LV systolic function, EF 60-65%, normal LV wall motion, Doppler parameters consistent with abnormal LV relaxation (grade 1 diastolic dysfunction). Very mild aortic stenosis with Valve area: 1.48 cm\S\2 (VTI) and Valve area: 1.49 cm\S\2 (Vmax).  Mitral valve with calcified annulus and mildly thickened leaflets. Trivial tricuspid regurgitation.  Chest xray report on 12/15/11 showed: "Questionable nodular lesion left lower lobe at the level of the anterior left fifth rib. It is possible that this area represents a bony prominence. Given question of a parenchymal lung mass, however, advise correlation with noncontrast enhanced chest CT. Lungs elsewhere are clear with the exception of mild scarring in the lung bases.  I called CXR results to Erie Noe at Dr. Edward Qualia office.  Will defer timing of further evaluation of possible LLL nodule to Dr. Yetta Barre.  Labs noted.  There were no orders at the time of her PAT visit.  She will need a PT/INR and T&S on the day of surgery.   If no significant change in her status then anticipate she can proceed from an anesthesia standpoint.  Shonna Chock, PA-C 12/16/11 [phone]

## 2011-12-21 ENCOUNTER — Other Ambulatory Visit: Payer: Self-pay | Admitting: Neurological Surgery

## 2011-12-21 DIAGNOSIS — R911 Solitary pulmonary nodule: Secondary | ICD-10-CM

## 2011-12-22 ENCOUNTER — Ambulatory Visit
Admission: RE | Admit: 2011-12-22 | Discharge: 2011-12-22 | Disposition: A | Discharge status: Home / Self Care | Payer: BC Managed Care – PPO | Source: Ambulatory Visit | Attending: Neurological Surgery | Admitting: Neurological Surgery

## 2011-12-22 DIAGNOSIS — R911 Solitary pulmonary nodule: Secondary | ICD-10-CM

## 2011-12-24 MED ORDER — DEXAMETHASONE SODIUM PHOSPHATE 10 MG/ML IJ SOLN
10.0000 mg | INTRAMUSCULAR | Status: AC
  Administered 2011-12-25: 10 mg via INTRAVENOUS
  Filled 2011-12-24: qty 1

## 2011-12-24 MED ORDER — VANCOMYCIN HCL IN DEXTROSE 1-5 GM/200ML-% IV SOLN
1000.0000 mg | INTRAVENOUS | Status: AC
  Administered 2011-12-25: 1000 mg via INTRAVENOUS
  Filled 2011-12-24: qty 200

## 2011-12-25 ENCOUNTER — Encounter (HOSPITAL_COMMUNITY): Payer: Self-pay | Admitting: Vascular Surgery

## 2011-12-25 ENCOUNTER — Encounter (HOSPITAL_COMMUNITY): Payer: Self-pay | Admitting: Neurological Surgery

## 2011-12-25 ENCOUNTER — Ambulatory Visit (HOSPITAL_COMMUNITY): Payer: BC Managed Care – PPO | Admitting: Vascular Surgery

## 2011-12-25 ENCOUNTER — Encounter (HOSPITAL_COMMUNITY)
Admission: RE | Disposition: A | Discharge status: Home / Self Care | Payer: Self-pay | Source: Ambulatory Visit | Attending: Neurological Surgery

## 2011-12-25 ENCOUNTER — Ambulatory Visit (HOSPITAL_COMMUNITY): Payer: BC Managed Care – PPO

## 2011-12-25 ENCOUNTER — Inpatient Hospital Stay (HOSPITAL_COMMUNITY)
Admission: RE | Admit: 2011-12-25 | Discharge: 2011-12-26 | DRG: 755 | Disposition: A | Discharge status: Home / Self Care | Payer: BC Managed Care – PPO | Source: Ambulatory Visit | Attending: Neurological Surgery | Admitting: Neurological Surgery

## 2011-12-25 ENCOUNTER — Encounter (HOSPITAL_COMMUNITY): Payer: Self-pay | Admitting: *Deleted

## 2011-12-25 DIAGNOSIS — L4050 Arthropathic psoriasis, unspecified: Secondary | ICD-10-CM | POA: Diagnosis present

## 2011-12-25 DIAGNOSIS — F17200 Nicotine dependence, unspecified, uncomplicated: Secondary | ICD-10-CM | POA: Diagnosis present

## 2011-12-25 DIAGNOSIS — M069 Rheumatoid arthritis, unspecified: Secondary | ICD-10-CM | POA: Diagnosis present

## 2011-12-25 DIAGNOSIS — Z885 Allergy status to narcotic agent status: Secondary | ICD-10-CM

## 2011-12-25 DIAGNOSIS — Z79899 Other long term (current) drug therapy: Secondary | ICD-10-CM

## 2011-12-25 DIAGNOSIS — Z880 Allergy status to penicillin: Secondary | ICD-10-CM

## 2011-12-25 DIAGNOSIS — E039 Hypothyroidism, unspecified: Secondary | ICD-10-CM | POA: Diagnosis present

## 2011-12-25 DIAGNOSIS — M47817 Spondylosis without myelopathy or radiculopathy, lumbosacral region: Principal | ICD-10-CM | POA: Diagnosis present

## 2011-12-25 DIAGNOSIS — Z860100 Personal history of colon polyps, unspecified: Secondary | ICD-10-CM

## 2011-12-25 DIAGNOSIS — E785 Hyperlipidemia, unspecified: Secondary | ICD-10-CM | POA: Diagnosis present

## 2011-12-25 DIAGNOSIS — K219 Gastro-esophageal reflux disease without esophagitis: Secondary | ICD-10-CM | POA: Diagnosis present

## 2011-12-25 DIAGNOSIS — Z8719 Personal history of other diseases of the digestive system: Secondary | ICD-10-CM

## 2011-12-25 DIAGNOSIS — M4310 Spondylolisthesis, site unspecified: Secondary | ICD-10-CM | POA: Diagnosis present

## 2011-12-25 HISTORY — PX: ANTERIOR LAT LUMBAR FUSION: SHX1168

## 2011-12-25 HISTORY — PX: LUMBAR PERCUTANEOUS PEDICLE SCREW 1 LEVEL: SHX5560

## 2011-12-25 LAB — GLUCOSE, CAPILLARY: Glucose-Capillary: 199 mg/dL — ABNORMAL HIGH (ref 70–99)

## 2011-12-25 LAB — CBC
MCH: 33.2 pg (ref 26.0–34.0)
MCV: 96.7 fL (ref 78.0–100.0)
Platelets: 184 10*3/uL (ref 150–400)
RBC: 3.61 MIL/uL — ABNORMAL LOW (ref 3.87–5.11)
RDW: 12.5 % (ref 11.5–15.5)

## 2011-12-25 LAB — TYPE AND SCREEN

## 2011-12-25 LAB — DIFFERENTIAL
Eosinophils Absolute: 0.1 10*3/uL (ref 0.0–0.7)
Eosinophils Relative: 2 % (ref 0–5)
Lymphocytes Relative: 53 % — ABNORMAL HIGH (ref 12–46)
Lymphs Abs: 2.9 10*3/uL (ref 0.7–4.0)
Monocytes Relative: 4 % (ref 3–12)
Neutrophils Relative %: 41 % — ABNORMAL LOW (ref 43–77)

## 2011-12-25 LAB — PROTIME-INR: Prothrombin Time: 13.5 seconds (ref 11.6–15.2)

## 2011-12-25 LAB — ABO/RH: ABO/RH(D): O POS

## 2011-12-25 SURGERY — ANTERIOR LATERAL LUMBAR FUSION 1 LEVEL
Anesthesia: General | Laterality: Left | Wound class: Clean

## 2011-12-25 MED ORDER — POTASSIUM CHLORIDE IN NACL 20-0.9 MEQ/L-% IV SOLN
INTRAVENOUS | Status: DC
  Administered 2011-12-25: 15:00:00 via INTRAVENOUS
  Administered 2011-12-26: 75 mL via INTRAVENOUS
  Filled 2011-12-25 (×3): qty 1000

## 2011-12-25 MED ORDER — SODIUM CHLORIDE 0.9 % IV SOLN
INTRAVENOUS | Status: DC | PRN
  Administered 2011-12-25: 09:00:00 via INTRAVENOUS

## 2011-12-25 MED ORDER — SODIUM CHLORIDE 0.9 % IR SOLN
Status: DC | PRN
  Administered 2011-12-25: 08:00:00

## 2011-12-25 MED ORDER — ACETAMINOPHEN 325 MG PO TABS: 650.0000 mg | ORAL_TABLET | ORAL | Status: DC | PRN

## 2011-12-25 MED ORDER — ZOLPIDEM TARTRATE 5 MG PO TABS: 5.0000 mg | ORAL_TABLET | Freq: Every evening | ORAL | Status: DC | PRN

## 2011-12-25 MED ORDER — MENTHOL 3 MG MT LOZG: 1.0000 | LOZENGE | OROMUCOSAL | Status: DC | PRN

## 2011-12-25 MED ORDER — ACETAMINOPHEN 10 MG/ML IV SOLN
INTRAVENOUS | Status: DC | PRN
  Administered 2011-12-25: 1000 mg via INTRAVENOUS

## 2011-12-25 MED ORDER — FENTANYL CITRATE 0.05 MG/ML IJ SOLN
INTRAMUSCULAR | Status: DC | PRN
  Administered 2011-12-25 (×5): 50 ug via INTRAVENOUS

## 2011-12-25 MED ORDER — DEXAMETHASONE SODIUM PHOSPHATE 4 MG/ML IJ SOLN
4.0000 mg | Freq: Four times a day (QID) | INTRAMUSCULAR | Status: DC
  Administered 2011-12-25: 4 mg via INTRAVENOUS
  Filled 2011-12-25 (×5): qty 1

## 2011-12-25 MED ORDER — CYCLOBENZAPRINE HCL 10 MG PO TABS
10.0000 mg | ORAL_TABLET | Freq: Three times a day (TID) | ORAL | Status: DC | PRN
  Administered 2011-12-25: 10 mg via ORAL

## 2011-12-25 MED ORDER — OXYCODONE-ACETAMINOPHEN 5-325 MG PO TABS: 1.0000 | ORAL_TABLET | ORAL | Status: DC | PRN

## 2011-12-25 MED ORDER — ARTIFICIAL TEARS OP OINT
TOPICAL_OINTMENT | OPHTHALMIC | Status: DC | PRN
  Administered 2011-12-25: 1 via OPHTHALMIC

## 2011-12-25 MED ORDER — SENNA 8.6 MG PO TABS
1.0000 | ORAL_TABLET | Freq: Two times a day (BID) | ORAL | Status: DC
  Administered 2011-12-25 – 2011-12-26 (×3): 8.6 mg via ORAL
  Filled 2011-12-25 (×3): qty 1

## 2011-12-25 MED ORDER — PROPOFOL 10 MG/ML IV BOLUS
INTRAVENOUS | Status: DC | PRN
  Administered 2011-12-25: 150 mg via INTRAVENOUS

## 2011-12-25 MED ORDER — FENTANYL CITRATE 0.05 MG/ML IJ SOLN
INTRAMUSCULAR | Status: AC
  Administered 2011-12-25: 50 ug via INTRAVENOUS
  Filled 2011-12-25: qty 2

## 2011-12-25 MED ORDER — LACTATED RINGERS IV SOLN
INTRAVENOUS | Status: DC | PRN
  Administered 2011-12-25 (×2): via INTRAVENOUS

## 2011-12-25 MED ORDER — SODIUM CHLORIDE 0.9 % IV SOLN: 250.0000 mL | INTRAVENOUS | Status: DC

## 2011-12-25 MED ORDER — PHENOL 1.4 % MT LIQD: 1.0000 | OROMUCOSAL | Status: DC | PRN

## 2011-12-25 MED ORDER — PANTOPRAZOLE SODIUM 40 MG PO TBEC
40.0000 mg | DELAYED_RELEASE_TABLET | Freq: Every day | ORAL | Status: DC
  Administered 2011-12-25 – 2011-12-26 (×2): 40 mg via ORAL
  Filled 2011-12-25: qty 1

## 2011-12-25 MED ORDER — PROPOFOL INFUSION 10 MG/ML OPTIME
INTRAVENOUS | Status: DC | PRN
  Administered 2011-12-25: 50 ug/kg/min via INTRAVENOUS

## 2011-12-25 MED ORDER — BUPIVACAINE HCL (PF) 0.25 % IJ SOLN
INTRAMUSCULAR | Status: DC | PRN
  Administered 2011-12-25: 3 mL

## 2011-12-25 MED ORDER — CYCLOBENZAPRINE HCL 10 MG PO TABS
ORAL_TABLET | ORAL | Status: AC
  Filled 2011-12-25: qty 1

## 2011-12-25 MED ORDER — SODIUM CHLORIDE 0.9 % IJ SOLN: 3.0000 mL | INTRAMUSCULAR | Status: DC | PRN

## 2011-12-25 MED ORDER — ACETAMINOPHEN 10 MG/ML IV SOLN
1000.0000 mg | Freq: Four times a day (QID) | INTRAVENOUS | Status: DC
  Administered 2011-12-25 – 2011-12-26 (×3): 1000 mg via INTRAVENOUS
  Filled 2011-12-25 (×4): qty 100

## 2011-12-25 MED ORDER — 0.9 % SODIUM CHLORIDE (POUR BTL) OPTIME
TOPICAL | Status: DC | PRN
  Administered 2011-12-25: 1000 mL

## 2011-12-25 MED ORDER — MIDAZOLAM HCL 5 MG/5ML IJ SOLN
INTRAMUSCULAR | Status: DC | PRN
  Administered 2011-12-25: 2 mg via INTRAVENOUS

## 2011-12-25 MED ORDER — ONDANSETRON HCL 4 MG/2ML IJ SOLN: 4.0000 mg | INTRAMUSCULAR | Status: DC | PRN

## 2011-12-25 MED ORDER — NEBIVOLOL HCL 5 MG PO TABS
5.0000 mg | ORAL_TABLET | Freq: Every day | ORAL | Status: DC
  Administered 2011-12-26: 5 mg via ORAL
  Filled 2011-12-25 (×2): qty 1

## 2011-12-25 MED ORDER — SUCCINYLCHOLINE CHLORIDE 20 MG/ML IJ SOLN
INTRAMUSCULAR | Status: DC | PRN
  Administered 2011-12-25: 100 mg via INTRAVENOUS

## 2011-12-25 MED ORDER — VANCOMYCIN HCL IN DEXTROSE 1-5 GM/200ML-% IV SOLN
1000.0000 mg | Freq: Once | INTRAVENOUS | Status: AC
  Administered 2011-12-25: 1000 mg via INTRAVENOUS
  Filled 2011-12-25: qty 200

## 2011-12-25 MED ORDER — ONDANSETRON HCL 4 MG/2ML IJ SOLN
INTRAMUSCULAR | Status: DC | PRN
  Administered 2011-12-25 (×2): 4 mg via INTRAVENOUS

## 2011-12-25 MED ORDER — ACETAMINOPHEN 650 MG RE SUPP: 650.0000 mg | RECTAL | Status: DC | PRN

## 2011-12-25 MED ORDER — EPHEDRINE SULFATE 50 MG/ML IJ SOLN
INTRAMUSCULAR | Status: DC | PRN
  Administered 2011-12-25 (×5): 10 mg via INTRAVENOUS

## 2011-12-25 MED ORDER — CEFAZOLIN SODIUM 1-5 GM-% IV SOLN
1.0000 g | Freq: Three times a day (TID) | INTRAVENOUS | Status: DC
  Filled 2011-12-25 (×2): qty 50

## 2011-12-25 MED ORDER — BACITRACIN 50000 UNITS IM SOLR
INTRAMUSCULAR | Status: AC
  Filled 2011-12-25: qty 1

## 2011-12-25 MED ORDER — MORPHINE SULFATE 2 MG/ML IJ SOLN
INTRAMUSCULAR | Status: AC
  Administered 2011-12-25: 4 mg via INTRAVENOUS
  Filled 2011-12-25: qty 2

## 2011-12-25 MED ORDER — LEVOTHYROXINE SODIUM 25 MCG PO TABS
25.0000 ug | ORAL_TABLET | Freq: Every day | ORAL | Status: DC
  Administered 2011-12-26: 25 ug via ORAL
  Filled 2011-12-25 (×2): qty 1

## 2011-12-25 MED ORDER — DEXTROSE 5 % IV SOLN
INTRAVENOUS | Status: DC | PRN
  Administered 2011-12-25: 09:00:00 via INTRAVENOUS

## 2011-12-25 MED ORDER — FENTANYL CITRATE 0.05 MG/ML IJ SOLN
25.0000 ug | INTRAMUSCULAR | Status: DC | PRN
  Administered 2011-12-25 (×4): 50 ug via INTRAVENOUS

## 2011-12-25 MED ORDER — SODIUM CHLORIDE 0.9 % IJ SOLN
3.0000 mL | Freq: Two times a day (BID) | INTRAMUSCULAR | Status: DC
  Administered 2011-12-25: 3 mL via INTRAVENOUS

## 2011-12-25 MED ORDER — FOLIC ACID 1 MG PO TABS
2.0000 mg | ORAL_TABLET | Freq: Every day | ORAL | Status: DC
Start: 2011-12-25 — End: 2011-12-26
  Administered 2011-12-26: 2 mg via ORAL
  Filled 2011-12-25 (×2): qty 2

## 2011-12-25 MED ORDER — DEXAMETHASONE 4 MG PO TABS
4.0000 mg | ORAL_TABLET | Freq: Four times a day (QID) | ORAL | Status: DC
  Administered 2011-12-25 – 2011-12-26 (×3): 4 mg via ORAL
  Filled 2011-12-25 (×8): qty 1

## 2011-12-25 MED ORDER — ALBUMIN HUMAN 5 % IV SOLN
INTRAVENOUS | Status: DC | PRN
  Administered 2011-12-25: 10:00:00 via INTRAVENOUS

## 2011-12-25 MED ORDER — SODIUM CHLORIDE 0.9 % IV SOLN
INTRAVENOUS | Status: AC
  Filled 2011-12-25: qty 500

## 2011-12-25 MED ORDER — HEMOSTATIC AGENTS (NO CHARGE) OPTIME
TOPICAL | Status: DC | PRN
  Administered 2011-12-25: 1 via TOPICAL

## 2011-12-25 MED ORDER — LACTATED RINGERS IV SOLN
INTRAVENOUS | Status: DC | PRN
  Administered 2011-12-25: 09:00:00 via INTRAVENOUS

## 2011-12-25 MED ORDER — MORPHINE SULFATE 2 MG/ML IJ SOLN
1.0000 mg | INTRAMUSCULAR | Status: DC | PRN
  Administered 2011-12-25 (×3): 4 mg via INTRAVENOUS
  Administered 2011-12-26: 2 mg via INTRAVENOUS
  Filled 2011-12-25: qty 1
  Filled 2011-12-25 (×2): qty 2

## 2011-12-25 MED ORDER — LIDOCAINE HCL (CARDIAC) 20 MG/ML IV SOLN
INTRAVENOUS | Status: DC | PRN
  Administered 2011-12-25: 80 mg via INTRAVENOUS

## 2011-12-25 MED ORDER — CALCIUM CARBONATE-VITAMIN D 500-200 MG-UNIT PO TABS
1.0000 | ORAL_TABLET | Freq: Two times a day (BID) | ORAL | Status: DC
  Administered 2011-12-25 – 2011-12-26 (×2): 1 via ORAL
  Filled 2011-12-25 (×4): qty 1

## 2011-12-25 SURGICAL SUPPLY — 72 items
ADH SKN CLS APL DERMABOND .7 (GAUZE/BANDAGES/DRESSINGS) ×2
APL SKNCLS STERI-STRIP NONHPOA (GAUZE/BANDAGES/DRESSINGS) ×1
BAG DECANTER FOR FLEXI CONT (MISCELLANEOUS) ×3 IMPLANT
BENZOIN TINCTURE PRP APPL 2/3 (GAUZE/BANDAGES/DRESSINGS) ×3 IMPLANT
BLADE SURG ROTATE 9660 (MISCELLANEOUS) IMPLANT
BONE MATRIX OSTEOCEL PLUS 5CC (Bone Implant) ×1 IMPLANT
CLOTH BEACON ORANGE TIMEOUT ST (SAFETY) ×3 IMPLANT
CONT SPEC 4OZ CLIKSEAL STRL BL (MISCELLANEOUS) ×1 IMPLANT
COVER BACK TABLE 24X17X13 BIG (DRAPES) IMPLANT
COVER TABLE BACK 60X90 (DRAPES) ×2 IMPLANT
DERMABOND ADVANCED (GAUZE/BANDAGES/DRESSINGS) ×2
DERMABOND ADVANCED .7 DNX12 (GAUZE/BANDAGES/DRESSINGS) ×2 IMPLANT
DRAPE C-ARM 42X72 X-RAY (DRAPES) ×4 IMPLANT
DRAPE C-ARMOR (DRAPES) ×3 IMPLANT
DRAPE LAPAROTOMY 100X72X124 (DRAPES) ×3 IMPLANT
DRAPE POUCH INSTRU U-SHP 10X18 (DRAPES) ×2 IMPLANT
DRAPE SURG 17X23 STRL (DRAPES) ×10 IMPLANT
DRESSING TELFA 8X3 (GAUZE/BANDAGES/DRESSINGS) ×3 IMPLANT
DRSG OPSITE 4X5.5 SM (GAUZE/BANDAGES/DRESSINGS) ×6 IMPLANT
DURAPREP 26ML APPLICATOR (WOUND CARE) ×2 IMPLANT
ELECT BLADE 4.0 EZ CLEAN MEGAD (MISCELLANEOUS)
ELECT REM PT RETURN 9FT ADLT (ELECTROSURGICAL) ×2
ELECTRODE BLDE 4.0 EZ CLN MEGD (MISCELLANEOUS) IMPLANT
ELECTRODE REM PT RTRN 9FT ADLT (ELECTROSURGICAL) ×2 IMPLANT
EVACUATOR 1/8 PVC DRAIN (DRAIN) IMPLANT
GAUZE SPONGE 4X4 16PLY XRAY LF (GAUZE/BANDAGES/DRESSINGS) IMPLANT
GLOVE BIO SURGEON STRL SZ8 (GLOVE) ×5 IMPLANT
GLOVE BIOGEL PI IND STRL 7.0 (GLOVE) IMPLANT
GLOVE BIOGEL PI INDICATOR 7.0 (GLOVE) ×2
GLOVE ECLIPSE 8.5 STRL (GLOVE) ×1 IMPLANT
GLOVE OPTIFIT SS 6.5 STRL BRWN (GLOVE) ×3 IMPLANT
GOWN BRE IMP SLV AUR LG STRL (GOWN DISPOSABLE) ×3 IMPLANT
GOWN BRE IMP SLV AUR XL STRL (GOWN DISPOSABLE) ×2 IMPLANT
GOWN STRL REIN 2XL LVL4 (GOWN DISPOSABLE) ×1 IMPLANT
GUIDEWIRE NITINOL BEVEL TIP (WIRE) ×1 IMPLANT
HEMOSTAT POWDER KIT SURGIFOAM (HEMOSTASIS) IMPLANT
IMPLANT COROENT XL 10X22X50MM (Intraocular Lens) ×1 IMPLANT
KIT BASIN OR (CUSTOM PROCEDURE TRAY) ×3 IMPLANT
KIT DILATOR XLIF 5 (KITS) IMPLANT
KIT MAXCESS (KITS) ×1 IMPLANT
KIT NDL NVM5 EMG ELECT (KITS) IMPLANT
KIT NEEDLE NVM5 EMG ELECT (KITS) ×1 IMPLANT
KIT NEEDLE NVM5 EMG ELECTRODE (KITS) ×1
KIT ROOM TURNOVER OR (KITS) ×3 IMPLANT
KIT XLIF (KITS) ×1
NDL HYPO 25X1 1.5 SAFETY (NEEDLE) ×1 IMPLANT
NDL I PASS (NEEDLE) IMPLANT
NEEDLE HYPO 22GX1.5 SAFETY (NEEDLE) ×2 IMPLANT
NEEDLE HYPO 25X1 1.5 SAFETY (NEEDLE) ×2 IMPLANT
NEEDLE I PASS (NEEDLE) ×2 IMPLANT
NS IRRIG 1000ML POUR BTL (IV SOLUTION) ×3 IMPLANT
PACK LAMINECTOMY NEURO (CUSTOM PROCEDURE TRAY) ×3 IMPLANT
PUTTY BONE DBX 5CC MIX (Putty) ×1 IMPLANT
ROD 45MM (Rod) ×1 IMPLANT
SCREW PRECEPT 6.5X45 (Screw) ×2 IMPLANT
SCREW PRECEPT SET (Screw) ×2 IMPLANT
SPONGE GAUZE 4X4 12PLY (GAUZE/BANDAGES/DRESSINGS) ×2 IMPLANT
SPONGE LAP 4X18 X RAY DECT (DISPOSABLE) IMPLANT
SPONGE SURGIFOAM ABS GEL SZ50 (HEMOSTASIS) IMPLANT
STAPLER SKIN PROX WIDE 3.9 (STAPLE) ×1 IMPLANT
STRIP CLOSURE SKIN 1/2X4 (GAUZE/BANDAGES/DRESSINGS) ×2 IMPLANT
SUT VIC AB 0 CT1 18XCR BRD8 (SUTURE) ×3 IMPLANT
SUT VIC AB 0 CT1 8-18 (SUTURE) ×2
SUT VIC AB 2-0 CP2 18 (SUTURE) ×4 IMPLANT
SUT VIC AB 3-0 SH 8-18 (SUTURE) ×5 IMPLANT
SWABSTICK BENZOIN STERILE (MISCELLANEOUS) ×2 IMPLANT
SYR 20ML ECCENTRIC (SYRINGE) ×3 IMPLANT
TAPE CLOTH 3X10 TAN LF (GAUZE/BANDAGES/DRESSINGS) ×6 IMPLANT
TOWEL OR 17X24 6PK STRL BLUE (TOWEL DISPOSABLE) ×5 IMPLANT
TOWEL OR 17X26 10 PK STRL BLUE (TOWEL DISPOSABLE) ×3 IMPLANT
TRAY FOLEY CATH 14FRSI W/METER (CATHETERS) ×2 IMPLANT
WATER STERILE IRR 1000ML POUR (IV SOLUTION) ×3 IMPLANT

## 2011-12-25 NOTE — OR Nursing (Signed)
WEDDING RING AND BAND GIVEN TO PATIENT HUSBAND MR [place] BY Ronaldo Miyamoto RN

## 2011-12-25 NOTE — Anesthesia Postprocedure Evaluation (Signed)
  Anesthesia Post-op Note  Patient: April Cox  Procedure(s) Performed: Procedure(s) (LRB) with comments: ANTERIOR LATERAL LUMBAR FUSION 1 LEVEL (Left) - LUMBAR FOUR-FIVE LUMBAR PERCUTANEOUS PEDICLE SCREW 1 LEVEL (Left) - LUMBAR FOUR-FIVE  Patient Location: PACU  Anesthesia Type:General  Level of Consciousness: awake  Airway and Oxygen Therapy: Patient Spontanous Breathing  Post-op Pain: mild  Post-op Assessment: Post-op Vital signs reviewed  Post-op Vital Signs: Reviewed  Complications: No apparent anesthesia complications

## 2011-12-25 NOTE — Op Note (Signed)
12/25/2011  11:11 AM  PATIENT:  April Cox  56 y.o. female  PRE-OPERATIVE DIAGNOSIS:  Grade 1 spondylolisthesis L4-5 with spinal stenosis and back and leg pain  POST-OPERATIVE DIAGNOSIS:  Same  PROCEDURE:  1. Anterolateral retroperitoneal interbody fusion L4-5 utilizing a 10 mm peek interbody cage packed with morcellized allograft, 2. Nonsegmental fixation L4-5 utilizing percutaneous pedicle screws  SURGEON:  Marikay Alar, MD  ASSISTANTS: Dr. Jordan Likes  ANESTHESIA:   General  EBL: 25 ml  Total I/O In: 1400 [I.V.:1400] Out: 100 [Urine:100]  BLOOD ADMINISTERED:none  DRAINS: None   SPECIMEN:  No Specimen  INDICATION FOR PROCEDURE: This patient presented with significant back and left leg pain. She had pain for well over 2 years. She had MRI and plain films as well as flexion extension views that showed a mobile spondylolisthesis at L4-5. Recommended an instrumented fusion to address her segmental instability. Patient understood the risks, benefits, and alternatives and potential outcomes and wished to proceed.  PROCEDURE DETAILS: The patient was brought to the operating room and after induction of adequate generalized endotracheal anesthesia, the patient was positioned in the right lateral decubitus position exposing the left side. The patient was positioned in the typical XLIF fashion and taped into position and trajectories were confirmed with AP lateral fluoroscopy. The skin was cleaned and then prepped with DuraPrep and draped in the usual sterile fashion. 5 cc of local anesthesia was injected. A lateral incision was made over the appropriate disc space and a incision was made posterior lateral to this. Blunt finger dissection was used to enter the retroperitoneal space. I could palpate the psoas musculature as well as the anterior face of the transverse process, and I could feel the fatty tissues of the retroperitoneum. I swept my finger up to the lateral incision and passed my  first dilator down to psoas musculature utilizing my index finger. We then used EMG monitoring to monitor our dilator. We checked our position with fluoroscopy and then placed a K wire into the disc space, and then used sequential dilators until our final retractor was in place. We then positioned it utilizing AP lateral fluoroscopy, and used our ball probe to make sure there were no neural structures within the working channel. I then placed the intradiscal shim, and opened my retractor further. The annulus was then incised and the discectomy was done with pituitaries. The annulus was removed from the endplates with a Cobb and the opposite annulus was opened and released. I then used a series of scrapers and shavers to prepare the endplates, taking care not to violate the endplates. I then placed my 16 mm paddle across the disc space to further open opposite annulus and I checked the trajectory with AP and lateral fluoroscopy. I then used sequential trials to determine the correct size cage. The 10 standard trial fit the best, so a corresponding 10 mm x 22 mm x 50 mm peek cage was selected and packed with morcellized allograft. Using sliders it was then tapped gently into the disc space utilizing AP fluoroscopy until it was appropriately positioned. I then irrigated with saline solution containing bacitracin and dried any bleeding points. I checked my final construct with AP lateral fluoroscopy, removed my retractor, and turned my attention to the nonsegmental fixation. AP and lateral fluoroscopy was used to determine landmarks. Stab incisions were made over the pedicles and a Jamshidi needle was passed down to identify the transverse process and the pedicle screw entry zone. Utilizing AP fluoroscopy, we started  the needle at the lateral border of the pedicle and tapped it into the pedicle into it reached the medial border of the pedicle on the AP image. EMG monitoring was used. We then checked our lateral  fluoroscopy to assure our needle was in good position, and then passed our K wire into the vertebral body. We did this for each pedicle in the same way. We were then able to tap over each K wire, measured the correct length of our screws, and then place our pedicle screws over the K wires until they were in correct position. We then passed our rod through the towers and locked into position with locking caps and antitorque device. We then checked our final construct with AP lateral fluoroscopy. Then irrigated with saline solution containing bacitracin and dried all bleeding points. The wounds were then closed in layers of 0 Vicryl in the fascia, 2-0 Vicryl in the subcutaneous tissues, and 3-0 Vicryl in the subcuticular tissues. The skin was closed with Dermabond and the patient was awakened from general anesthesia and transferred to the recovery in stable condition. At the end of the procedure all sponge, needle and instrument counts were correct.  PLAN OF CARE: Admit to inpatient   PATIENT DISPOSITION:  PACU - hemodynamically stable.   Delay start of Pharmacological VTE agent (>24hrs) due to surgical blood loss or risk of bleeding:  yes

## 2011-12-25 NOTE — Anesthesia Procedure Notes (Signed)
Procedure Name: Intubation Date/Time: 12/25/2011 8:57 AM Performed by: Margaree Mackintosh Pre-anesthesia Checklist: Patient identified, Timeout performed, Emergency Drugs available, Suction available and Patient being monitored Patient Re-evaluated:Patient Re-evaluated prior to inductionOxygen Delivery Method: Circle system utilized Preoxygenation: Pre-oxygenation with 100% oxygen Intubation Type: IV induction and Rapid sequence Laryngoscope Size: Mac and 3 Grade View: Grade I Tube type: Oral Tube size: 7.0 mm Number of attempts: 1 Airway Equipment and Method: Stylet and LTA kit utilized (soft bite block placed) Placement Confirmation: ETT inserted through vocal cords under direct vision,  positive ETCO2 and breath sounds checked- equal and bilateral Secured at: 23 cm Tube secured with: Tape Dental Injury: Teeth and Oropharynx as per pre-operative assessment

## 2011-12-25 NOTE — Anesthesia Preprocedure Evaluation (Addendum)
Anesthesia Evaluation  Patient identified by MRN, date of birth, ID band Patient awake    Reviewed: Allergy & Precautions, H&P , NPO status , Patient's Chart, lab work & pertinent test results  Airway Mallampati: II      Dental   Pulmonary neg pulmonary ROS,  breath sounds clear to auscultation        Cardiovascular Rhythm:Regular Rate:Normal  History of heart murmur. Cardiac history noted. No SOB or CP. Echo noted.   Neuro/Psych    GI/Hepatic Neg liver ROS, GERD-  ,  Endo/Other  Hypothyroidism   Renal/GU negative Renal ROS     Musculoskeletal  (+) Arthritis -, Rheumatoid disorders,    Abdominal   Peds  Hematology   Anesthesia Other Findings   Reproductive/Obstetrics                          Anesthesia Physical Anesthesia Plan  ASA: II  Anesthesia Plan: General   Post-op Pain Management:    Induction: Intravenous  Airway Management Planned: Oral ETT  Additional Equipment:   Intra-op Plan:   Post-operative Plan: Possible Post-op intubation/ventilation  Informed Consent: I have reviewed the patients History and Physical, chart, labs and discussed the procedure including the risks, benefits and alternatives for the proposed anesthesia with the patient or authorized representative who has indicated his/her understanding and acceptance.   Dental advisory given  Plan Discussed with: CRNA, Anesthesiologist and Surgeon  Anesthesia Plan Comments:         Anesthesia Quick Evaluation

## 2011-12-25 NOTE — Preoperative (Signed)
Beta Blockers   Reason not to administer Beta Blockers:Not Applicable 

## 2011-12-25 NOTE — H&P (Signed)
Subjective: Patient is a 56 y.o. female admitted for XLIF L4-5. Onset of symptoms was several months ago, gradually worsening since that time.  The pain is rated severe, and is located at the across the lower back and radiates to LLE. The pain is described as aching and occurs intermittently. The symptoms have been progressive. Symptoms are exacerbated by exercise and standing. MRI or CT showed spondylolisthesis L4-5.   Past Medical History  Diagnosis Date  . Psoriatic arthritis     Dr. Corliss Skains  . Hyperlipidemia   . Hypothyroidism   . Rheumatoid arthritis     Dr. Corliss Skains  . Diverticulosis of colon   . GERD (gastroesophageal reflux disease)     Past Surgical History  Procedure Date  . Peroneal nerve decompression     Dr. Onalee Hua Tereso Unangst/neurosurgeon  . Dilation and curettage of uterus   . Endometrial ablation   . Colonoscopy 2011    colon polyps    Prior to Admission medications   Medication Sig Start Date End Date Taking? Authorizing Provider  Adalimumab (HUMIRA PEN Dazey) Inject 1 each into the skin every 14 (fourteen) days.    Yes Historical Provider, MD  atorvastatin (LIPITOR) 40 MG tablet Take 1 tablet (40 mg total) by mouth daily. 05/11/11 05/10/12 Yes Kirkland Hun, MD  BYSTOLIC 5 MG tablet TAKE ONE TABLET BY MOUTH ONE TIME DAILY 02/19/11  Yes Ronnald Nian, MD  calcium-vitamin D (OSCAL WITH D) 500-200 MG-UNIT per tablet Take 1 tablet by mouth 2 (two) times daily.     Yes Historical Provider, MD  fish oil-omega-3 fatty acids 1000 MG capsule Take 1 g by mouth daily.    Yes Historical Provider, MD  folic acid (FOLVITE) 1 MG tablet Take 2 mg by mouth daily.    Yes Historical Provider, MD  levothyroxine (LEVOTHROID) 25 MCG tablet Take 1 tablet (25 mcg total) by mouth daily. 05/11/11 05/10/12 Yes Kirkland Hun, MD  methotrexate (RHEUMATREX) 2.5 MG tablet Take 20 mg by mouth once a week. Caution:Chemotherapy. Protect from light. On saturday   Yes Historical Provider, MD  omeprazole  (PRILOSEC) 40 MG capsule TAKE ONE CAPSULE BY MOUTH ONE TIME DAILY 08/06/11  Yes Ronnald Nian, MD  vitamin C (ASCORBIC ACID) 250 MG tablet Take 250 mg by mouth daily.     Yes Historical Provider, MD  EPINEPHrine (EPI-PEN) 0.3 mg/0.3 mL DEVI Inject 0.3 mg into the muscle as needed.      Historical Provider, MD   Allergies  Allergen Reactions  . Other Anaphylaxis    Bee stings   Glutin intolerance  . Codeine     REACTION: severe nausea/vomiting  . Gluten Meal Swelling and Other (See Comments)    inflammation  . Penicillins     REACTION: swelling, rash, dyspnea    History  Substance Use Topics  . Smoking status: Current Every Day Smoker -- 0.5 packs/day    Types: Cigarettes  . Smokeless tobacco: Never Used     Comment: quitting smoking  . Alcohol Use: 3.5 oz/week    7 drink(s) per week    Family History  Problem Relation Age of Onset  . Arthritis Mother     OA   . Psoriasis Mother   . Other Father     accidental death/MVA at age 49  . Hypertension Brother   . Other Brother     1 brother drowned  . Hypertension Maternal Grandmother   . Diabetes Maternal Grandmother   . Hypertension Maternal Grandfather   .  Heart disease Neg Hx   . Cancer Neg Hx      Review of Systems  Positive ROS: neg  All other systems have been reviewed and were otherwise negative with the exception of those mentioned in the HPI and as above.  Objective: Vital signs in last 24 hours: Temp:  [98 F (36.7 C)] 98 F (36.7 C) (12/06 0606) Pulse Rate:  [80] 80  (12/06 0606) Resp:  [20] 20  (12/06 0606) BP: (110)/(68) 110/68 mmHg (12/06 0606) SpO2:  [97 %] 97 % (12/06 0606)  General Appearance: Alert, cooperative, no distress, appears stated age Head: Normocephalic, without obvious abnormality, atraumatic Eyes: PERRL, conjunctiva/corneas clear, EOM's intact, fundi benign, both eyes      Ears: Normal TM's and external ear canals, both ears Throat: Lips, mucosa, and tongue normal; teeth and gums  normal Neck: Supple, symmetrical, trachea midline, no adenopathy; thyroid: No enlargement/tenderness/nodules; no carotid bruit or JVD Back: Symmetric, no curvature, ROM normal, no CVA tenderness Lungs: Clear to auscultation bilaterally, respirations unlabored Heart: Regular rate and rhythm, S1 and S2 normal, no murmur, rub or gallop Abdomen: Soft, non-tender, bowel sounds active all four quadrants, no masses, no organomegaly Extremities: Extremities normal, atraumatic, no cyanosis or edema Pulses: 2+ and symmetric all extremities Skin: Skin color, texture, turgor normal, no rashes or lesions  NEUROLOGIC:   Mental status: Alert and oriented x4,  no aphasia, good attention span, fund of knowledge, and memory Motor Exam - grossly normal Sensory Exam - grossly normal Reflexes: 1+ Coordination - grossly normal Gait - grossly normal Balance - grossly normal Cranial Nerves: I: smell Not tested  II: visual acuity  OS: nl    OD: nl  II: visual fields Full to confrontation  II: pupils Equal, round, reactive to light  III,VII: ptosis None  III,IV,VI: extraocular muscles  Full ROM  V: mastication Normal  V: facial light touch sensation  Normal  V,VII: corneal reflex  Present  VII: facial muscle function - upper  Normal  VII: facial muscle function - lower Normal  VIII: hearing Not tested  IX: soft palate elevation  Normal  IX,X: gag reflex Present  XI: trapezius strength  5/5  XI: sternocleidomastoid strength 5/5  XI: neck flexion strength  5/5  XII: tongue strength  Normal    Data Review Lab Results  Component Value Date   WBC 5.4 12/25/2011   HGB 12.0 12/25/2011   HCT 34.9* 12/25/2011   MCV 96.7 12/25/2011   PLT 184 12/25/2011   Lab Results  Component Value Date   NA 139 12/15/2011   K 4.6 12/15/2011   CL 103 12/15/2011   CO2 28 12/15/2011   BUN 11 12/15/2011   CREATININE 0.71 12/15/2011   GLUCOSE 96 12/15/2011   Lab Results  Component Value Date   INR 1.04 12/25/2011     Assessment/Plan: Patient admitted for XLIF L4-5. Patient has failed conservative therapy.  I explained the condition and procedure to the patient and answered any questions.  Patient wishes to proceed with procedure as planned. Understands risks/ benefits and typical outcomes of procedure.   Canaan Holzer S 12/25/2011 8:45 AM

## 2011-12-25 NOTE — Transfer of Care (Signed)
Immediate Anesthesia Transfer of Care Note  Patient: April Cox  Procedure(s) Performed: Procedure(s) (LRB) with comments: ANTERIOR LATERAL LUMBAR FUSION 1 LEVEL (Left) - LUMBAR FOUR-FIVE LUMBAR PERCUTANEOUS PEDICLE SCREW 1 LEVEL (Left) - LUMBAR FOUR-FIVE  Patient Location: PACU  Anesthesia Type:General  Level of Consciousness: awake, alert  and oriented  Airway & Oxygen Therapy: Patient Spontanous Breathing and Patient connected to nasal cannula oxygen  Post-op Assessment: Report given to PACU RN, Post -op Vital signs reviewed and stable and Patient moving all extremities X 4  Post vital signs: Reviewed and stable  Complications: No apparent anesthesia complications

## 2011-12-26 MED ORDER — TRAMADOL HCL 50 MG PO TABS: 50.0000 mg | ORAL_TABLET | Freq: Four times a day (QID) | ORAL | Status: DC | PRN

## 2011-12-26 MED ORDER — TRAMADOL HCL 50 MG PO TABS: 100.0000 mg | ORAL_TABLET | Freq: Three times a day (TID) | ORAL | Status: DC | PRN

## 2011-12-26 NOTE — Evaluation (Signed)
Occupational Therapy Evaluation Patient Details Name: April Cox MRN: [id number] DOB: [birth year] Today's Date: 12/26/2011 Time: 4098-1191 OT Time Calculation (min): 35 min  OT Assessment / Plan / Recommendation Clinical Impression  Pleasant 56 yr old female admitted for lumbar fusion L4-L5.  Pt currently independent to modified independent for simulated selfcare tasks and functional selfcare related transfers.  No further OT needs.    OT Assessment  Patient does not need any further OT services    Follow Up Recommendations  No OT follow up       Equipment Recommendations  None recommended by OT          Precautions / Restrictions Precautions Precautions: Back Restrictions Weight Bearing Restrictions: No   Pertinent Vitals/Pain Pain 2/10, repositioned pt    ADL  Eating/Feeding: Simulated;Independent Where Assessed - Eating/Feeding: Edge of bed Grooming: Performed;Independent Where Assessed - Grooming: Unsupported standing Upper Body Bathing: Simulated;Independent Where Assessed - Upper Body Bathing: Unsupported standing Lower Body Bathing: Simulated;Modified independent Where Assessed - Lower Body Bathing: Unsupported standing Upper Body Dressing: Performed;Independent Where Assessed - Upper Body Dressing: Unsupported sitting Lower Body Dressing: Performed;Modified independent;Other (comment) (with use of reacher and sockaide) Where Assessed - Lower Body Dressing: Unsupported sit to stand Toilet Transfer: Performed;Independent Toilet Transfer Method: Other (comment) (ambulate without assistive device) Toilet Transfer Equipment: Regular height toilet Toileting - Clothing Manipulation and Hygiene: Simulated;Independent Where Assessed - Toileting Clothing Manipulation and Hygiene: Sit to stand from 3-in-1 or toilet Tub/Shower Transfer: Simulated;Independent Tub/Shower Transfer Method: Ambulating Equipment Used: Back brace Transfers/Ambulation Related to ADLs: Pt  is independent with all mobility and transfers ADL Comments: Pt educated on back precautions, AE available, and techniques to use for bathing and dressing tasks.  No OT related DME or follow-up recommended.      Visit Information  Last OT Received On: 12/26/11 Assistance Needed: +1    Subjective Data  Subjective: I've been up to the bathroom a few times already. Patient Stated Goal: Go home today   Prior Functioning     Home Living Lives With: Spouse Available Help at Discharge: Available 24 hours/day Type of Home: House Home Access: Stairs to enter [employer] of Steps: 3 Entrance Stairs-Rails: Can reach both;Left;Right Home Layout: One level Bathroom Shower/Tub: Engineer, manufacturing systems: Standard Bathroom Accessibility: Yes Home Adaptive Equipment: None Prior Function Level of Independence: Independent Able to Take Stairs?: Yes Driving: Yes Vocation: Full time employment Communication Communication: No difficulties Dominant Hand: Right         Vision/Perception Vision - Assessment Eye Alignment: Within Functional Limits Vision Assessment: Vision not tested Perception Perception: Within Functional Limits Praxis Praxis: Intact   Cognition  Overall Cognitive Status: Appears within functional limits for tasks assessed/performed Arousal/Alertness: Awake/alert Orientation Level: Appears intact for tasks assessed Behavior During Session: [facility] for tasks performed    Extremity/Trunk Assessment Right Upper Extremity Assessment RUE ROM/Strength/Tone: Within functional levels;Due to precautions (AROM WFLS) RUE Sensation: WFL - Light Touch RUE Coordination: WFL - gross/fine motor Left Upper Extremity Assessment LUE ROM/Strength/Tone: Within functional levels;Due to precautions (AROM WFLS) LUE Coordination: WFL - gross/fine motor     Mobility Bed Mobility Bed Mobility: Rolling Right;Right Sidelying to Sit Rolling Right: 7: Independent Right  Sidelying to Sit: 7: Independent Transfers Transfers: Sit to Stand Sit to Stand: 7: Independent;Without upper extremity assist           Balance Balance Balance Assessed: Yes Dynamic Standing Balance Dynamic Standing - Balance Support: No upper extremity supported Dynamic Standing - Level  of Assistance: 7: Independent   End of Session OT - End of Session Equipment Utilized During Treatment: Gait belt;Back brace Activity Tolerance: Patient tolerated treatment well Patient left: in bed;with call bell/phone within reach;with family/visitor present Nurse Communication: Mobility status     Devera Englander OTR/L 12/26/2011, 9:23 AM

## 2011-12-26 NOTE — Evaluation (Signed)
Physical Therapy Evaluation Patient Details Name: April Cox MRN: [id number] DOB: [birth year] Today's Date: 12/26/2011 Time: 4696-2952 PT Time Calculation (min): 30 min  PT Assessment / Plan / Recommendation Clinical Impression  Pt s/p ALF L4-5. Pt moving well, supervision to independent with all mobility. SPoke with pt and husband regarding safety upon d/c. No further PT needs, will not follow    PT Assessment  Patent does not need any further PT services    Follow Up Recommendations       Does the patient have the potential to tolerate intense rehabilitation      Barriers to Discharge        Equipment Recommendations  None recommended by OT    Recommendations for Other Services     Frequency      Precautions / Restrictions Precautions Precautions: Back Precaution Booklet Issued: Yes (comment) Precaution Comments: pt able to verbalize 3/3 back precautions Required Braces or Orthoses: Spinal Brace Spinal Brace: Lumbar corset;Applied in sitting position Restrictions Weight Bearing Restrictions: No   Pertinent Vitals/Pain Minimal pain in back, 3/10. RN aware.       Mobility  Bed Mobility Bed Mobility: Sit to Supine Rolling Right: 7: Independent Right Sidelying to Sit: 7: Independent Sit to Supine: 7: Independent Transfers Transfers: Sit to Stand;Stand to Sit Sit to Stand: 6: Modified independent (Device/Increase time) Stand to Sit: 6: Modified independent (Device/Increase time) Details for Transfer Assistance: Increased time, no cueing or assistance needed Ambulation/Gait Ambulation/Gait Assistance: 5: Supervision Ambulation Distance (Feet): 400 Feet Assistive device: None Ambulation/Gait Assistance Details: Supervision for safety only. Pt with good postural control Gait Pattern: Within Functional Limits Stairs: Yes Stairs Assistance: 5: Supervision Stairs Assistance Details (indicate cue type and reason): Cues for proper technique leading with strong  leg and descending with weak leg Stair Management Technique: One rail Right;Step to pattern;Forwards;Alternating pattern Number of Stairs: 5     Shoulder Instructions     Exercises     PT Diagnosis:    PT Problem List:   PT Treatment Interventions:     PT Goals    Visit Information  Last PT Received On: 12/26/11 Assistance Needed: +1    Subjective Data      Prior Functioning  Home Living Lives With: Spouse Available Help at Discharge: Available 24 hours/day Type of Home: House Home Access: Stairs to enter Secretary/administrator of Steps: 3 Entrance Stairs-Rails: Can reach both;Left;Right Home Layout: One level Bathroom Shower/Tub: Engineer, manufacturing systems: Standard Bathroom Accessibility: Yes Home Adaptive Equipment: None Prior Function Level of Independence: Independent Able to Take Stairs?: Yes Driving: Yes Vocation: Full time employment Communication Communication: No difficulties Dominant Hand: Right    Cognition  Overall Cognitive Status: Appears within functional limits for tasks assessed/performed Arousal/Alertness: Awake/alert Orientation Level: Appears intact for tasks assessed Behavior During Session: [facility] for tasks performed    Extremity/Trunk Assessment Right Upper Extremity Assessment RUE ROM/Strength/Tone: Within functional levels;Due to precautions (AROM WFLS) RUE Sensation: WFL - Light Touch RUE Coordination: WFL - gross/fine motor Left Upper Extremity Assessment LUE ROM/Strength/Tone: Within functional levels;Due to precautions (AROM WFLS) LUE Coordination: WFL - gross/fine motor Right Lower Extremity Assessment RLE ROM/Strength/Tone: Within functional levels RLE Sensation: WFL - Light Touch Left Lower Extremity Assessment LLE ROM/Strength/Tone: Within functional levels LLE Sensation: WFL - Light Touch   Balance Balance Balance Assessed: Yes Dynamic Standing Balance Dynamic Standing - Balance Support: No upper extremity  supported Dynamic Standing - Level of Assistance: 7: Independent  End of Session PT -  End of Session Equipment Utilized During Treatment: Gait belt;Back brace Activity Tolerance: Patient tolerated treatment well Patient left: in bed;with call bell/phone within reach;with family/visitor present Nurse Communication: Mobility status  GP     Milana Kidney 12/26/2011, 10:26 AM  12/26/2011 Milana Kidney DPT PAGER: [phone] OFFICE: [phone]

## 2011-12-26 NOTE — Progress Notes (Signed)
[facility]  Patient Status: New  [facility] is providing the following services: DME- Rolling Walker- Patient Refused Equipment in Hospital Per Shaune Leeks  If patient discharges after hours, please call [phone].   April Cox 12/26/2011, 10:09 AM

## 2011-12-26 NOTE — Discharge Summary (Signed)
Physician Discharge Summary  Patient ID: April Cox MRN: [id number] DOB/AGE: [birth year] 57 y.o.  Admit date: 12/25/2011 Discharge date: 12/26/2011  Admission Diagnoses: Lumbar spondylosis with radiculopathy L4-L5, lumbar instability  Discharge Diagnoses: Lumbar spondylosis with radiculopathy L4-L5, lumbar instability Active Problems:  * No active hospital problems. *    Discharged Condition: good  Hospital Course: Patient was admitted to undergo anterolateral decompression at L4-L5 fixation with pedicle screws which he tolerated well she is discharged area  Consults: None  Significant Diagnostic Studies: None  Treatments: surgery: Anterolateral decompression L4-L5 percutaneous pedicle screw fixation L4-L5 arthrodesis with peek spacer allograft  Discharge Exam: Blood pressure 156/72, pulse 66, temperature 98.2 F (36.8 C), temperature source Oral, resp. rate 18, height 5\' 6"  (1.676 m), weight 62.143 kg (137 lb), SpO2 98.00%. Incision is clean and dry motor function intact in iliopsoas quadriceps tibialis anterior and gastrocs  Disposition: Home  Discharge Orders    Future Orders Please Complete By Expires   Diet - low sodium heart healthy      Increase activity slowly      Discharge instructions      Comments:   Okay to shower. Do not apply salves or ointments to incision. No heavy lifting with the upper extremities greater than 15 pounds. May resume driving when not requiring pain medication and patient feels comfortable with doing so.   Call MD for:  redness, tenderness, or signs of infection (pain, swelling, redness, odor or green/yellow discharge around incision site)      Call MD for:  severe uncontrolled pain      Call MD for:  temperature >100.4          Medication List     As of 12/26/2011 11:22 AM    TAKE these medications         atorvastatin 40 MG tablet   Commonly known as: LIPITOR   Take 1 tablet (40 mg total) by mouth daily.      BYSTOLIC 5 MG  tablet   Generic drug: nebivolol   TAKE ONE TABLET BY MOUTH ONE TIME DAILY      calcium-vitamin D 500-200 MG-UNIT per tablet   Commonly known as: OSCAL WITH D   Take 1 tablet by mouth 2 (two) times daily.      EPINEPHrine 0.3 mg/0.3 mL Devi   Commonly known as: EPI-PEN   Inject 0.3 mg into the muscle as needed.      fish oil-omega-3 fatty acids 1000 MG capsule   Take 1 g by mouth daily.      folic acid 1 MG tablet   Commonly known as: FOLVITE   Take 2 mg by mouth daily.      HUMIRA PEN G[place]ner   Inject 1 each into the skin every 14 (fourteen) days.      levothyroxine 25 MCG tablet   Commonly known as: SYNTHROID, LEVOTHROID   Take 1 tablet (25 mcg total) by mouth daily.      methotrexate 2.5 MG tablet   Commonly known as: RHEUMATREX   Take 20 mg by mouth once a week. Caution:Chemotherapy. Protect from light. On saturday      omeprazole 40 MG capsule   Commonly known as: PRILOSEC   TAKE ONE CAPSULE BY MOUTH ONE TIME DAILY      traMADol 50 MG tablet   Commonly known as: ULTRAM   Take 1 tablet (50 mg total) by mouth every 6 (six) hours as needed for pain.  vitamin C 250 MG tablet   Commonly known as: ASCORBIC ACID   Take 250 mg by mouth daily.         SignedStefani Dama 12/26/2011, 11:22 AM

## 2011-12-28 ENCOUNTER — Encounter (HOSPITAL_COMMUNITY): Payer: Self-pay | Admitting: Neurological Surgery

## 2012-02-12 ENCOUNTER — Other Ambulatory Visit: Payer: Self-pay | Admitting: Obstetrics and Gynecology

## 2012-02-15 NOTE — Telephone Encounter (Signed)
Can pt have refill on rx 

## 2012-03-07 ENCOUNTER — Other Ambulatory Visit: Payer: Self-pay | Admitting: Family Medicine

## 2012-03-23 ENCOUNTER — Ambulatory Visit (INDEPENDENT_AMBULATORY_CARE_PROVIDER_SITE_OTHER): Payer: BC Managed Care – PPO | Admitting: Family Medicine

## 2012-03-23 ENCOUNTER — Encounter: Payer: Self-pay | Admitting: Family Medicine

## 2012-03-23 VITALS — BP 140/82 | HR 68 | Temp 98.3°F | Wt 151.0 lb

## 2012-03-23 DIAGNOSIS — J0100 Acute maxillary sinusitis, unspecified: Secondary | ICD-10-CM

## 2012-03-23 DIAGNOSIS — L4050 Arthropathic psoriasis, unspecified: Secondary | ICD-10-CM

## 2012-03-23 DIAGNOSIS — F17200 Nicotine dependence, unspecified, uncomplicated: Secondary | ICD-10-CM

## 2012-03-23 DIAGNOSIS — Z79899 Other long term (current) drug therapy: Secondary | ICD-10-CM

## 2012-03-23 DIAGNOSIS — F17210 Nicotine dependence, cigarettes, uncomplicated: Secondary | ICD-10-CM

## 2012-03-23 MED ORDER — CLARITHROMYCIN 500 MG PO TABS: 500.0000 mg | ORAL_TABLET | Freq: Two times a day (BID) | ORAL | Status: DC

## 2012-03-23 NOTE — Progress Notes (Signed)
  Subjective:    Patient ID: April Cox, female    DOB: 1955/02/23, 57 y.o.   MRN: [id number]  HPI 5 days ago she had difficulty with rhinorrhea, nasal congestion, PND and malaise. Approximately 2 days later she noted increased discomfort in the left maxillary area . He notes pain in the eye, left upper teeth and left ear. No fever, chills or sore throat. She has underlying psoriatic arthritis and has been on Humira. She was recently switched to Remicade and she continues on MTX. She continues to smoke.   Review of Systems     Objective:   Physical Exam alert and in no distress. Tympanic membranes and canals are normal. Throat is clear. Tonsils are normal. Neck is supple without adenopathy or thyromegaly. Cardiac exam shows a regular sinus rhythm without murmurs or gallops. Lungs are clear to auscultation. Nasal mucosa is red bilaterally. Tender to palpation over left maxillary sinus.        Assessment & Plan:  Psoriatic arthritis  Cigarette smoker  Encounter for long-term (current) use of other medications  Acute maxillary sinusitis - Plan: clarithromycin (BIAXIN) 500 MG tablet she is to call me if not entirely better when she finishes the course of antibiotic. Discussed smoking cessation with her and at this time she is not quite ready to quit smoking.

## 2012-03-28 ENCOUNTER — Other Ambulatory Visit: Payer: Self-pay | Admitting: Obstetrics and Gynecology

## 2012-05-24 ENCOUNTER — Telehealth: Payer: Self-pay | Admitting: Internal Medicine

## 2012-05-24 NOTE — Telephone Encounter (Signed)
Faxed over medical records to [facility] @ [phone] 

## 2012-08-15 ENCOUNTER — Other Ambulatory Visit: Payer: Self-pay | Admitting: Family Medicine

## 2012-08-19 ENCOUNTER — Encounter: Payer: Self-pay | Admitting: Gastroenterology

## 2012-12-29 ENCOUNTER — Other Ambulatory Visit: Payer: Self-pay | Admitting: Family Medicine

## 2013-01-24 ENCOUNTER — Ambulatory Visit (INDEPENDENT_AMBULATORY_CARE_PROVIDER_SITE_OTHER): Payer: BC Managed Care – PPO | Admitting: Medical

## 2013-01-24 ENCOUNTER — Encounter: Payer: Self-pay | Admitting: Medical

## 2013-01-24 VITALS — BP 130/88 | HR 60 | Temp 98.1°F | Wt 150.0 lb

## 2013-01-24 DIAGNOSIS — J069 Acute upper respiratory infection, unspecified: Secondary | ICD-10-CM

## 2013-01-24 DIAGNOSIS — J329 Chronic sinusitis, unspecified: Secondary | ICD-10-CM

## 2013-01-24 DIAGNOSIS — Z79899 Other long term (current) drug therapy: Secondary | ICD-10-CM

## 2013-01-24 MED ORDER — AZITHROMYCIN 250 MG PO TABS: ORAL_TABLET | ORAL | Status: DC

## 2013-01-24 MED ORDER — GUAIFENESIN-DM 100-10 MG/5ML PO SYRP: 5.0000 mL | ORAL_SOLUTION | Freq: Three times a day (TID) | ORAL | Status: DC | PRN

## 2013-01-24 NOTE — Progress Notes (Signed)
Subjective:  April Cox is a 58 y.o. female who presents for respiratory infection.  Symptoms include 1+wk hx/o cold symptoms, stuffy head, congestion in head, runny nose, scratchy throat, ear pressure, some cough, but now more consistent colored mucous blowing out of nose.  On remicade and methotrexate, didn't want this to get worse.  Denies fever, NVD, rash, chest congestion.  Denies sick contacts.  No other aggravating or relieving factors.  No other c/o.  ROS as in subjective  Past Medical History  Diagnosis Date  . Psoriatic arthritis     Dr. Estanislado Pandy  . Hyperlipidemia   . Hypothyroidism   . Rheumatoid arthritis(714.0)     Dr. Estanislado Pandy  . Diverticulosis of colon   . GERD (gastroesophageal reflux disease)     Objective: Filed Vitals:   01/24/13 1639  BP: 130/88  Pulse: 60  Temp: 98.1 F (36.7 C)    General appearance: Alert, WD/WN, no distress                             Skin: warm, no rash                           Head: +maxillary sinus tenderness,                            Eyes: conjunctiva normal, corneas clear, PERRLA                            Ears: serous effusion behind TMs, external ear canals normal                          Nose: septum midline, turbinates swollen, with erythema and clear discharge             Mouth/throat: MMM, tongue normal, mild pharyngeal erythema                           Neck: supple, no adenopathy, no thyromegaly, nontender                        Lungs: CTA bilaterally, no wheezes, rales, or rhonchi      Assessment and Plan:   Encounter Diagnoses  Name Primary?  . Sinusitis Yes  . Upper respiratory infection   . High risk medication use      Prescription given for Azithromycin.  Can use OTC Mucinex DM for congestion.  Discussed nasal saline flush, and call or return if worse or not improving in 2-3 days.

## 2013-02-16 LAB — HM MAMMOGRAPHY: HM MAMMO: NORMAL (ref 0–4)

## 2013-02-16 LAB — HM PAP SMEAR: HM Pap smear: NORMAL

## 2013-02-16 LAB — HM DEXA SCAN

## 2013-03-12 ENCOUNTER — Other Ambulatory Visit: Payer: Self-pay | Admitting: Family Medicine

## 2013-03-17 ENCOUNTER — Telehealth: Payer: Self-pay | Admitting: Family Medicine

## 2013-03-17 MED ORDER — NEBIVOLOL HCL 5 MG PO TABS: ORAL_TABLET | ORAL | Status: DC

## 2013-03-17 NOTE — Telephone Encounter (Signed)
Medication sent in. 

## 2013-03-20 ENCOUNTER — Encounter: Payer: Self-pay | Admitting: Medical

## 2013-03-20 ENCOUNTER — Ambulatory Visit (INDEPENDENT_AMBULATORY_CARE_PROVIDER_SITE_OTHER): Payer: BC Managed Care – PPO | Admitting: Medical

## 2013-03-20 VITALS — BP 132/82 | HR 62 | Temp 97.9°F | Resp 16 | Wt 146.0 lb

## 2013-03-20 DIAGNOSIS — J069 Acute upper respiratory infection, unspecified: Secondary | ICD-10-CM

## 2013-03-20 DIAGNOSIS — F17200 Nicotine dependence, unspecified, uncomplicated: Secondary | ICD-10-CM

## 2013-03-20 NOTE — Progress Notes (Signed)
Subjective:  April Cox is a 58 y.o. female who presents for illness.  She has hx/o RA, on Methotrexate and Remicaide.  Next infusion in 2wk, wanted to get checked out today.  Symptoms include stuffy head, mild sore throat, eye pressure, headache, has some colored sinus drainage, has coughed up some phlegm.  Has had some ear burning sensation.  Cough started yesterday.  Used some left over cough medication from prior visit which helped the cough.  Has had some nausea.  Denies fever, vomiting.  Patient is a smoker.  Using cough syrup for symptoms.  +sick contacts.  No other aggravating or relieving factors.  No other c/o.  ROS as in subjective   Objective: Filed Vitals:   03/20/13 1102  BP: 132/82  Pulse: 62  Temp: 97.9 F (36.6 C)  Resp: 16    General appearance: Alert, WD/WN, no distress                             Skin: warm, no rash                           Head: mild maxillary sinus tenderness                            Eyes: conjunctiva normal, corneas clear, PERRLA                            Ears: pearly TMs, external ear canals normal                          Nose: septum midline, turbinates with erythema and no discharge             Mouth/throat: MMM, tongue normal, mild pharyngeal erythema                           Neck: supple, no adenopathy, no thyromegaly, nontender                         Lungs: CTA bilaterally, no wheezes, rales, or rhonchi      Assessment and Plan: Encounter Diagnoses  Name Primary?  Marland Kitchen Upper respiratory infection Yes  . Smoker    Currently symptoms and exam suggest viral URI.  Discussed supportive care, rest, hydration.  Discussed symptomatic relief, nasal saline flush, and call or return if worse or not improving in 2-3 days.

## 2013-03-27 ENCOUNTER — Other Ambulatory Visit: Payer: Self-pay | Admitting: Family Medicine

## 2013-04-06 ENCOUNTER — Encounter: Payer: BC Managed Care – PPO | Admitting: Family Medicine

## 2013-04-10 ENCOUNTER — Other Ambulatory Visit: Payer: Self-pay | Admitting: Obstetrics and Gynecology

## 2013-04-14 ENCOUNTER — Encounter: Payer: Self-pay | Admitting: Gastroenterology

## 2013-04-18 ENCOUNTER — Encounter: Payer: Self-pay | Admitting: Family Medicine

## 2013-04-18 ENCOUNTER — Ambulatory Visit (INDEPENDENT_AMBULATORY_CARE_PROVIDER_SITE_OTHER): Payer: BC Managed Care – PPO | Admitting: Family Medicine

## 2013-04-18 VITALS — BP 130/84 | HR 60 | Wt 149.0 lb

## 2013-04-18 DIAGNOSIS — E039 Hypothyroidism, unspecified: Secondary | ICD-10-CM

## 2013-04-18 DIAGNOSIS — E785 Hyperlipidemia, unspecified: Secondary | ICD-10-CM

## 2013-04-18 DIAGNOSIS — L4050 Arthropathic psoriasis, unspecified: Secondary | ICD-10-CM

## 2013-04-18 DIAGNOSIS — F17210 Nicotine dependence, cigarettes, uncomplicated: Secondary | ICD-10-CM

## 2013-04-18 DIAGNOSIS — K219 Gastro-esophageal reflux disease without esophagitis: Secondary | ICD-10-CM

## 2013-04-18 DIAGNOSIS — I10 Essential (primary) hypertension: Secondary | ICD-10-CM

## 2013-04-18 DIAGNOSIS — F17200 Nicotine dependence, unspecified, uncomplicated: Secondary | ICD-10-CM

## 2013-04-18 DIAGNOSIS — Z86010 Personal history of colonic polyps: Secondary | ICD-10-CM

## 2013-04-18 MED ORDER — NEBIVOLOL HCL 5 MG PO TABS: ORAL_TABLET | ORAL | Status: DC

## 2013-04-18 MED ORDER — OMEPRAZOLE 40 MG PO CPDR: DELAYED_RELEASE_CAPSULE | ORAL | Status: DC

## 2013-04-18 NOTE — Patient Instructions (Signed)
Back off on the use of Prilosec based on your symptoms.

## 2013-04-18 NOTE — Progress Notes (Signed)
   Subjective:    Patient ID: April Cox, female    DOB: Oct 15, 1955, 58 y.o.   MRN: [id number]  HPI She is here for medication check. She was seen recently by her gynecologist and blood work for thyroid and lipid was done. It was previously reviewed. She continues to smoke but has made progress in decreasing the amount of cigarette smoking. She does have underlying psoriatic arthritis and is being followed by a rheumatologist. She does have reflux disease and uses Prilosec for this. She rarely drinks and exercise is fairly regularly. Her work keeps her busy and on the road traveling. She has had her Lipitor and Synthroid renewed already. She apparently did have an abnormal Pap and is scheduled for followup on this. She will also have a colonoscopy for followup on colonic polyps. Mammogram is also scheduled.   Review of Systems     Objective:   Physical Exam alert and in no distress. Tympanic membranes and canals are normal. Throat is clear. Tonsils are normal. Neck is supple without adenopathy or thyromegaly. Cardiac exam shows a regular sinus rhythm without murmurs or gallops. Lungs are clear to auscultation.        Assessment & Plan:  Cigarette smoker  Hyperlipidemia  Hypothyroidism  Psoriatic arthritis  Personal history of colonic polyps  Hypertension - Plan: nebivolol (BYSTOLIC) 5 MG tablet  GERD (gastroesophageal reflux disease) - Plan: omeprazole (PRILOSEC) 40 MG capsule  continue on present medication regimen. Discussed using Prilosec on an as-needed basis. Also discussed coming here for her routine medical care including lipids and thyroid.

## 2013-04-19 HISTORY — PX: CARPAL TUNNEL RELEASE: SHX101

## 2013-04-25 ENCOUNTER — Other Ambulatory Visit: Payer: Self-pay | Admitting: Medical

## 2013-04-25 ENCOUNTER — Telehealth: Payer: Self-pay | Admitting: Family Medicine

## 2013-04-25 MED ORDER — EPINEPHRINE 0.3 MG/0.3ML IJ SOAJ: 0.3000 mg | Freq: Once | INTRAMUSCULAR | Status: DC

## 2013-04-25 NOTE — Telephone Encounter (Signed)
rx sent

## 2013-04-25 NOTE — Telephone Encounter (Signed)
Is this okay to refill? 

## 2013-04-26 ENCOUNTER — Encounter: Payer: Self-pay | Admitting: Family Medicine

## 2013-08-03 ENCOUNTER — Telehealth: Payer: Self-pay | Admitting: Internal Medicine

## 2013-08-03 NOTE — Telephone Encounter (Signed)
No Prior Auth Needed for Bystolic. Called pharmacy to make sure, and everything went through fine

## 2014-01-29 ENCOUNTER — Other Ambulatory Visit (HOSPITAL_COMMUNITY): Payer: Self-pay | Admitting: *Deleted

## 2014-01-30 ENCOUNTER — Encounter (HOSPITAL_COMMUNITY)
Admission: RE | Admit: 2014-01-30 | Discharge: 2014-01-30 | Disposition: A | Discharge status: Home / Self Care | Payer: BLUE CROSS/BLUE SHIELD | Source: Ambulatory Visit | Attending: Rheumatology | Admitting: Rheumatology

## 2014-01-30 DIAGNOSIS — Z5181 Encounter for therapeutic drug level monitoring: Secondary | ICD-10-CM | POA: Diagnosis not present

## 2014-01-30 DIAGNOSIS — L4050 Arthropathic psoriasis, unspecified: Secondary | ICD-10-CM | POA: Diagnosis not present

## 2014-01-30 MED ORDER — SODIUM CHLORIDE 0.9 % IV SOLN
INTRAVENOUS | Status: DC
  Administered 2014-01-30: 250 mL via INTRAVENOUS

## 2014-01-30 MED ORDER — SODIUM CHLORIDE 0.9 % IV SOLN
300.0000 mg | INTRAVENOUS | Status: DC
  Administered 2014-01-30: 300 mg via INTRAVENOUS
  Filled 2014-01-30: qty 30

## 2014-02-22 ENCOUNTER — Encounter: Payer: Self-pay | Admitting: Family Medicine

## 2014-02-22 ENCOUNTER — Ambulatory Visit (INDEPENDENT_AMBULATORY_CARE_PROVIDER_SITE_OTHER): Payer: BLUE CROSS/BLUE SHIELD | Admitting: Family Medicine

## 2014-02-22 VITALS — BP 124/70 | HR 78 | Temp 98.2°F | Wt 144.0 lb

## 2014-02-22 DIAGNOSIS — L4050 Arthropathic psoriasis, unspecified: Secondary | ICD-10-CM

## 2014-02-22 DIAGNOSIS — Z720 Tobacco use: Secondary | ICD-10-CM

## 2014-02-22 DIAGNOSIS — J069 Acute upper respiratory infection, unspecified: Secondary | ICD-10-CM

## 2014-02-22 DIAGNOSIS — F17210 Nicotine dependence, cigarettes, uncomplicated: Secondary | ICD-10-CM

## 2014-02-22 NOTE — Patient Instructions (Addendum)
Use Afrin nasal spray at night. Keep me informed if your symptoms get worse but expect your cough to become productive. With the pharmacist on medications that you can use with methotrexate. Try NyQuil at night if they will let you. You might need to hold the MTX

## 2014-02-22 NOTE — Progress Notes (Signed)
   Subjective:    Patient ID: April Cox, female    DOB: 11-21-1955, 59 y.o.   MRN: [id number]  HPI  She presents with a one day history of nasal congestion, L ear fullness, PND, and cough. She denies fever/chills, ST, facial pain, or shortness of breath. Her concern is that she is visiting with her grandchildren this weekend and does not want to expose them to her germs. She is a smoker and is currently taking MTX, prednisone, and weekly Remicade injections for her psoriatic arthritis.      Review of Systems  All other systems reviewed and are negative.      Objective:   Physical Exam Alert and in no distress. Tympanic membranes and canals are normal. Throat is erythematous without exudate. Tonsils are normal. Neck is supple without adenopathy or thyromegaly. Cardiac exam shows a regular sinus rhythm without murmurs or gallops. Lungs are clear to auscultation.        Assessment & Plan:  Psoriatic arthritis  Cigarette smoker  URI, acute Advised her to check with pharmacist regarding appropriate OTC cold medications since she taking MTX and weekly Remicade. She may use afrin at night for nasal congestion.  Discussed worsening symptoms and that she is to call if not improving in a few days or if symptoms worsen. Educated her on proper hygiene including hand washing to prevent exposing others. Also discussed smoking cessation and at this time she is in the contemplating stage.

## 2014-03-01 ENCOUNTER — Other Ambulatory Visit: Payer: Self-pay | Admitting: Family Medicine

## 2014-03-12 ENCOUNTER — Other Ambulatory Visit (HOSPITAL_COMMUNITY): Payer: Self-pay | Admitting: *Deleted

## 2014-03-13 ENCOUNTER — Encounter (HOSPITAL_COMMUNITY)
Admission: RE | Admit: 2014-03-13 | Discharge: 2014-03-13 | Disposition: A | Discharge status: Home / Self Care | Payer: BLUE CROSS/BLUE SHIELD | Source: Ambulatory Visit | Attending: Rheumatology | Admitting: Rheumatology

## 2014-03-13 DIAGNOSIS — L4050 Arthropathic psoriasis, unspecified: Secondary | ICD-10-CM | POA: Diagnosis not present

## 2014-03-13 MED ORDER — SODIUM CHLORIDE 0.9 % IV SOLN
INTRAVENOUS | Status: DC
  Administered 2014-03-13: 09:00:00 via INTRAVENOUS

## 2014-03-13 MED ORDER — SODIUM CHLORIDE 0.9 % IV SOLN
300.0000 mg | INTRAVENOUS | Status: DC
  Administered 2014-03-13: 300 mg via INTRAVENOUS
  Filled 2014-03-13: qty 30

## 2014-03-16 ENCOUNTER — Encounter: Payer: Self-pay | Admitting: Gastroenterology

## 2014-03-26 ENCOUNTER — Other Ambulatory Visit: Payer: Self-pay | Admitting: Obstetrics and Gynecology

## 2014-04-24 ENCOUNTER — Encounter (HOSPITAL_COMMUNITY): Payer: BLUE CROSS/BLUE SHIELD

## 2014-05-02 ENCOUNTER — Encounter (HOSPITAL_COMMUNITY): Payer: BLUE CROSS/BLUE SHIELD

## 2014-05-03 ENCOUNTER — Other Ambulatory Visit (HOSPITAL_COMMUNITY): Payer: Self-pay | Admitting: *Deleted

## 2014-05-04 ENCOUNTER — Other Ambulatory Visit: Payer: Self-pay | Admitting: Family Medicine

## 2014-05-04 ENCOUNTER — Encounter (HOSPITAL_COMMUNITY)
Admission: RE | Admit: 2014-05-04 | Discharge: 2014-05-04 | Disposition: A | Discharge status: Home / Self Care | Payer: BLUE CROSS/BLUE SHIELD | Source: Ambulatory Visit | Attending: Rheumatology | Admitting: Rheumatology

## 2014-05-04 DIAGNOSIS — Z5181 Encounter for therapeutic drug level monitoring: Secondary | ICD-10-CM | POA: Diagnosis not present

## 2014-05-04 DIAGNOSIS — L4050 Arthropathic psoriasis, unspecified: Secondary | ICD-10-CM | POA: Insufficient documentation

## 2014-05-04 LAB — COMPREHENSIVE METABOLIC PANEL
ALK PHOS: 71 U/L (ref 39–117)
ALT: 17 U/L (ref 0–35)
AST: 19 U/L (ref 0–37)
Albumin: 3.4 g/dL — ABNORMAL LOW (ref 3.5–5.2)
Anion gap: 7 (ref 5–15)
BILIRUBIN TOTAL: 0.5 mg/dL (ref 0.3–1.2)
CHLORIDE: 105 mmol/L (ref 96–112)
CO2: 26 mmol/L (ref 19–32)
Calcium: 9.2 mg/dL (ref 8.4–10.5)
Creatinine, Ser: 0.69 mg/dL (ref 0.50–1.10)
GFR calc non Af Amer: >90 mL/min (ref >90)
GLUCOSE: 94 mg/dL (ref 70–99)
Potassium: 4 mmol/L (ref 3.5–5.1)
Sodium: 138 mmol/L (ref 135–145)
Total Protein: 6.8 g/dL (ref 6.0–8.3)

## 2014-05-04 LAB — DIFFERENTIAL
BASOS PCT: 0 % (ref 0–1)
Basophils Absolute: 0 10*3/uL (ref 0.0–0.1)
EOS ABS: 0.1 10*3/uL (ref 0.0–0.7)
Eosinophils Relative: 1 % (ref 0–5)
LYMPHS PCT: 29 % (ref 12–46)
Lymphs Abs: 1.6 10*3/uL (ref 0.7–4.0)
Monocytes Absolute: 0.3 10*3/uL (ref 0.1–1.0)
Monocytes Relative: 5 % (ref 3–12)
Neutro Abs: 3.6 10*3/uL (ref 1.7–7.7)
Neutrophils Relative %: 65 % (ref 43–77)

## 2014-05-04 LAB — CBC
HCT: 36.3 % (ref 36.0–46.0)
HEMOGLOBIN: 12.2 g/dL (ref 12.0–15.0)
MCH: 32.3 pg (ref 26.0–34.0)
MCHC: 33.6 g/dL (ref 30.0–36.0)
MCV: 96 fL (ref 78.0–100.0)
Platelets: 221 10*3/uL (ref 150–400)
RBC: 3.78 MIL/uL — AB (ref 3.87–5.11)
RDW: 13.5 % (ref 11.5–15.5)
WBC: 5.6 10*3/uL (ref 4.0–10.5)

## 2014-05-04 MED ORDER — SODIUM CHLORIDE 0.9 % IV SOLN
300.0000 mg | INTRAVENOUS | Status: DC
  Administered 2014-05-04: 300 mg via INTRAVENOUS
  Filled 2014-05-04: qty 30

## 2014-05-04 MED ORDER — SODIUM CHLORIDE 0.9 % IV SOLN
INTRAVENOUS | Status: DC
  Administered 2014-05-04: 09:00:00 via INTRAVENOUS

## 2014-05-04 MED ORDER — ACETAMINOPHEN 325 MG PO TABS: 650.0000 mg | ORAL_TABLET | ORAL | Status: DC

## 2014-05-04 MED ORDER — DIPHENHYDRAMINE HCL 25 MG PO CAPS: 50.0000 mg | ORAL_CAPSULE | ORAL | Status: DC

## 2014-05-08 ENCOUNTER — Encounter: Payer: Self-pay | Admitting: Internal Medicine

## 2014-05-15 ENCOUNTER — Encounter: Payer: BLUE CROSS/BLUE SHIELD | Admitting: Gastroenterology

## 2014-06-03 ENCOUNTER — Other Ambulatory Visit: Payer: Self-pay | Admitting: Family Medicine

## 2014-06-14 ENCOUNTER — Other Ambulatory Visit (HOSPITAL_COMMUNITY): Payer: Self-pay | Admitting: *Deleted

## 2014-06-15 ENCOUNTER — Encounter (HOSPITAL_COMMUNITY)
Admission: RE | Admit: 2014-06-15 | Discharge: 2014-06-15 | Disposition: A | Discharge status: Home / Self Care | Payer: BLUE CROSS/BLUE SHIELD | Source: Ambulatory Visit | Attending: Rheumatology | Admitting: Rheumatology

## 2014-06-15 DIAGNOSIS — L4050 Arthropathic psoriasis, unspecified: Secondary | ICD-10-CM | POA: Diagnosis not present

## 2014-06-15 DIAGNOSIS — Z5181 Encounter for therapeutic drug level monitoring: Secondary | ICD-10-CM | POA: Insufficient documentation

## 2014-06-15 LAB — COMPREHENSIVE METABOLIC PANEL
ALK PHOS: 73 U/L (ref 38–126)
ALT: 17 U/L (ref 14–54)
AST: 19 U/L (ref 15–41)
Albumin: 3.8 g/dL (ref 3.5–5.0)
Anion gap: 8 (ref 5–15)
BUN: 7 mg/dL (ref 6–20)
CHLORIDE: 107 mmol/L (ref 101–111)
CO2: 23 mmol/L (ref 22–32)
CREATININE: 0.65 mg/dL (ref 0.44–1.00)
Calcium: 9.4 mg/dL (ref 8.9–10.3)
GFR calc Af Amer: >60 mL/min (ref >60)
GFR calc non Af Amer: >60 mL/min (ref >60)
GLUCOSE: 94 mg/dL (ref 65–99)
Potassium: 3.8 mmol/L (ref 3.5–5.1)
Sodium: 138 mmol/L (ref 135–145)
TOTAL PROTEIN: 6.9 g/dL (ref 6.5–8.1)
Total Bilirubin: 0.8 mg/dL (ref 0.3–1.2)

## 2014-06-15 LAB — CBC WITH DIFFERENTIAL/PLATELET
Basophils Absolute: 0 10*3/uL (ref 0.0–0.1)
Basophils Relative: 0 % (ref 0–1)
Eosinophils Absolute: 0.1 10*3/uL (ref 0.0–0.7)
Eosinophils Relative: 1 % (ref 0–5)
HCT: 38.4 % (ref 36.0–46.0)
Hemoglobin: 13 g/dL (ref 12.0–15.0)
LYMPHS PCT: 32 % (ref 12–46)
Lymphs Abs: 2.1 10*3/uL (ref 0.7–4.0)
MCH: 32.1 pg (ref 26.0–34.0)
MCHC: 33.9 g/dL (ref 30.0–36.0)
MCV: 94.8 fL (ref 78.0–100.0)
MONO ABS: 0.3 10*3/uL (ref 0.1–1.0)
Monocytes Relative: 5 % (ref 3–12)
NEUTROS PCT: 62 % (ref 43–77)
Neutro Abs: 4.1 10*3/uL (ref 1.7–7.7)
Platelets: 240 10*3/uL (ref 150–400)
RBC: 4.05 MIL/uL (ref 3.87–5.11)
RDW: 13.3 % (ref 11.5–15.5)
WBC: 6.7 10*3/uL (ref 4.0–10.5)

## 2014-06-15 MED ORDER — SODIUM CHLORIDE 0.9 % IV SOLN
300.0000 mg | INTRAVENOUS | Status: DC
  Administered 2014-06-15: 300 mg via INTRAVENOUS
  Filled 2014-06-15: qty 30

## 2014-06-15 MED ORDER — ACETAMINOPHEN 325 MG PO TABS: 650.0000 mg | ORAL_TABLET | ORAL | Status: DC

## 2014-06-15 MED ORDER — DIPHENHYDRAMINE HCL 25 MG PO CAPS: 50.0000 mg | ORAL_CAPSULE | ORAL | Status: DC

## 2014-06-15 MED ORDER — SODIUM CHLORIDE 0.9 % IV SOLN
INTRAVENOUS | Status: DC
  Administered 2014-06-15: 09:00:00 via INTRAVENOUS

## 2014-07-06 ENCOUNTER — Other Ambulatory Visit: Payer: Self-pay | Admitting: Family Medicine

## 2014-07-11 ENCOUNTER — Telehealth: Payer: Self-pay | Admitting: Family Medicine

## 2014-07-11 NOTE — Telephone Encounter (Signed)
Pt called for refill of Bystolic for wife to [facility].  It appears she needs appt   He will have his wife call for appt.

## 2014-07-12 ENCOUNTER — Other Ambulatory Visit: Payer: Self-pay

## 2014-07-12 MED ORDER — NEBIVOLOL HCL 5 MG PO TABS: 5.0000 mg | ORAL_TABLET | Freq: Every day | ORAL | Status: DC

## 2014-07-12 NOTE — Telephone Encounter (Signed)
I have sent med in 

## 2014-07-25 ENCOUNTER — Encounter: Payer: Self-pay | Admitting: Gastroenterology

## 2014-07-25 ENCOUNTER — Ambulatory Visit (HOSPITAL_COMMUNITY)
Admission: RE | Admit: 2014-07-25 | Discharge: 2014-07-25 | Disposition: A | Discharge status: Home / Self Care | Payer: BLUE CROSS/BLUE SHIELD | Source: Ambulatory Visit | Attending: Rheumatology | Admitting: Rheumatology

## 2014-07-25 DIAGNOSIS — L4050 Arthropathic psoriasis, unspecified: Secondary | ICD-10-CM | POA: Diagnosis present

## 2014-07-25 LAB — COMPREHENSIVE METABOLIC PANEL
ALT: 16 U/L (ref 14–54)
AST: 19 U/L (ref 15–41)
Albumin: 3.8 g/dL (ref 3.5–5.0)
Alkaline Phosphatase: 75 U/L (ref 38–126)
Anion gap: 8 (ref 5–15)
BUN: 7 mg/dL (ref 6–20)
CHLORIDE: 107 mmol/L (ref 101–111)
CO2: 24 mmol/L (ref 22–32)
Calcium: 9.4 mg/dL (ref 8.9–10.3)
Creatinine, Ser: 0.61 mg/dL (ref 0.44–1.00)
GFR calc Af Amer: >60 mL/min (ref >60)
Glucose, Bld: 102 mg/dL — ABNORMAL HIGH (ref 65–99)
Potassium: 3.7 mmol/L (ref 3.5–5.1)
SODIUM: 139 mmol/L (ref 135–145)
Total Bilirubin: 0.8 mg/dL (ref 0.3–1.2)
Total Protein: 7.3 g/dL (ref 6.5–8.1)

## 2014-07-25 LAB — CBC WITH DIFFERENTIAL/PLATELET
BASOS PCT: 0 % (ref 0–1)
Basophils Absolute: 0 10*3/uL (ref 0.0–0.1)
Eosinophils Absolute: 0.1 10*3/uL (ref 0.0–0.7)
Eosinophils Relative: 1 % (ref 0–5)
HCT: 36.7 % (ref 36.0–46.0)
HEMOGLOBIN: 12.9 g/dL (ref 12.0–15.0)
LYMPHS ABS: 2.1 10*3/uL (ref 0.7–4.0)
LYMPHS PCT: 33 % (ref 12–46)
MCH: 33.3 pg (ref 26.0–34.0)
MCHC: 35.1 g/dL (ref 30.0–36.0)
MCV: 94.8 fL (ref 78.0–100.0)
MONOS PCT: 5 % (ref 3–12)
Monocytes Absolute: 0.3 10*3/uL (ref 0.1–1.0)
NEUTROS ABS: 3.7 10*3/uL (ref 1.7–7.7)
Neutrophils Relative %: 61 % (ref 43–77)
Platelets: 219 10*3/uL (ref 150–400)
RBC: 3.87 MIL/uL (ref 3.87–5.11)
RDW: 12.8 % (ref 11.5–15.5)
WBC: 6.2 10*3/uL (ref 4.0–10.5)

## 2014-07-25 MED ORDER — SODIUM CHLORIDE 0.9 % IV SOLN: INTRAVENOUS | Status: DC

## 2014-07-25 MED ORDER — ACETAMINOPHEN 325 MG PO TABS: 650.0000 mg | ORAL_TABLET | ORAL | Status: DC

## 2014-07-25 MED ORDER — DIPHENHYDRAMINE HCL 25 MG PO TABS
50.0000 mg | ORAL_TABLET | ORAL | Status: DC
  Filled 2014-07-25: qty 2

## 2014-07-25 MED ORDER — SODIUM CHLORIDE 0.9 % IV SOLN
300.0000 mg | INTRAVENOUS | Status: DC
  Administered 2014-07-25: 300 mg via INTRAVENOUS
  Filled 2014-07-25: qty 30

## 2014-07-27 ENCOUNTER — Encounter (HOSPITAL_COMMUNITY): Payer: BLUE CROSS/BLUE SHIELD

## 2014-07-28 LAB — QUANTIFERON TB GOLD ASSAY (BLOOD)

## 2014-07-28 LAB — QUANTIFERON IN TUBE
QFT TB AG MINUS NIL VALUE: <0 IU/mL
QUANTIFERON MITOGEN VALUE: >10 IU/mL
QUANTIFERON NIL VALUE: 0.04 [IU]/mL
QUANTIFERON TB AG VALUE: 0.02 [IU]/mL
QUANTIFERON TB GOLD: NEGATIVE

## 2014-08-09 ENCOUNTER — Telehealth: Payer: Self-pay | Admitting: Family Medicine

## 2014-08-09 ENCOUNTER — Other Ambulatory Visit: Payer: Self-pay

## 2014-08-09 MED ORDER — NEBIVOLOL HCL 5 MG PO TABS: 5.0000 mg | ORAL_TABLET | Freq: Every day | ORAL | Status: DC

## 2014-08-09 NOTE — Telephone Encounter (Signed)
Pt needs refill Bystolic to [facility] on [address]

## 2014-08-11 NOTE — Telephone Encounter (Signed)
done

## 2014-08-17 ENCOUNTER — Other Ambulatory Visit: Payer: Self-pay | Admitting: Obstetrics and Gynecology

## 2014-08-23 ENCOUNTER — Encounter (HOSPITAL_BASED_OUTPATIENT_CLINIC_OR_DEPARTMENT_OTHER): Payer: Self-pay | Admitting: *Deleted

## 2014-08-23 DIAGNOSIS — E039 Hypothyroidism, unspecified: Secondary | ICD-10-CM | POA: Diagnosis not present

## 2014-08-23 DIAGNOSIS — E785 Hyperlipidemia, unspecified: Secondary | ICD-10-CM | POA: Diagnosis not present

## 2014-08-23 DIAGNOSIS — Z86010 Personal history of colonic polyps: Secondary | ICD-10-CM | POA: Diagnosis not present

## 2014-08-23 DIAGNOSIS — D069 Carcinoma in situ of cervix, unspecified: Secondary | ICD-10-CM | POA: Diagnosis not present

## 2014-08-23 DIAGNOSIS — K219 Gastro-esophageal reflux disease without esophagitis: Secondary | ICD-10-CM | POA: Diagnosis not present

## 2014-08-23 DIAGNOSIS — R87810 Cervical high risk human papillomavirus (HPV) DNA test positive: Secondary | ICD-10-CM | POA: Diagnosis not present

## 2014-08-23 DIAGNOSIS — F17210 Nicotine dependence, cigarettes, uncomplicated: Secondary | ICD-10-CM | POA: Diagnosis not present

## 2014-08-23 DIAGNOSIS — K5730 Diverticulosis of large intestine without perforation or abscess without bleeding: Secondary | ICD-10-CM | POA: Diagnosis not present

## 2014-08-23 DIAGNOSIS — I350 Nonrheumatic aortic (valve) stenosis: Secondary | ICD-10-CM | POA: Diagnosis not present

## 2014-08-23 DIAGNOSIS — L4050 Arthropathic psoriasis, unspecified: Secondary | ICD-10-CM | POA: Diagnosis not present

## 2014-08-23 LAB — BASIC METABOLIC PANEL
Anion gap: 7 (ref 5–15)
BUN: 10 mg/dL (ref 6–20)
CALCIUM: 9.3 mg/dL (ref 8.9–10.3)
CHLORIDE: 104 mmol/L (ref 101–111)
CO2: 26 mmol/L (ref 22–32)
Creatinine, Ser: 0.77 mg/dL (ref 0.44–1.00)
GFR calc non Af Amer: >60 mL/min (ref >60)
GLUCOSE: 119 mg/dL — AB (ref 65–99)
Potassium: 4.3 mmol/L (ref 3.5–5.1)
SODIUM: 137 mmol/L (ref 135–145)

## 2014-08-23 LAB — CBC
HCT: 39.2 % (ref 36.0–46.0)
HEMOGLOBIN: 13.6 g/dL (ref 12.0–15.0)
MCH: 33.7 pg (ref 26.0–34.0)
MCHC: 34.7 g/dL (ref 30.0–36.0)
MCV: 97 fL (ref 78.0–100.0)
Platelets: 253 10*3/uL (ref 150–400)
RBC: 4.04 MIL/uL (ref 3.87–5.11)
RDW: 12.9 % (ref 11.5–15.5)
WBC: 8.3 10*3/uL (ref 4.0–10.5)

## 2014-08-23 NOTE — Progress Notes (Signed)
NPO AFTER MN.  ARRIVE AT 1030.  NEEDS EKG.  WILL GET LABS DONE TODAY. (CBC AND BMET).  WILL TAKE AM MEDS DOS W/ SIPS OF WATER.

## 2014-08-27 ENCOUNTER — Other Ambulatory Visit: Payer: Self-pay | Admitting: Obstetrics and Gynecology

## 2014-08-27 NOTE — H&P (Signed)
Admission History and Physical Exam for a Gynecology Patient  April Cox is a 58 y.o. female, No obstetric history on file., who presents for a cold knife conization of the cervix. She has been followed at the [facility] and Gynecology division of [facility]. 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 Colposcopy and biopsies were benign. She has had a conization in the past because of abnormal Pap smears. Her high risk HPV test is positive.  OB History    No data available      Past Medical History  Diagnosis Date  . Psoriatic arthritis     Dr. Estanislado Pandy  . Hyperlipidemia   . Hypothyroidism   . Diverticulosis of colon   . GERD (gastroesophageal reflux disease)   . Mild aortic valve stenosis     per echo 10-09-2010  ef 60-65%  valva area 1.48cm  . Psoriasis   . History of adenomatous polyp of colon     11-06-2009  tubullar  . Abnormal Pap smear of cervix   . HPV in female   . Family history of adverse reaction to anesthesia     mother PONV  . Heart murmur     asymptomatic per pt  . Wears glasses     No prescriptions prior to admission    Past Surgical History  Procedure Laterality Date  . Peroneal nerve decompression Left 12-27-2009    for neuropathy with  foot drop  . Colonoscopy  11-06-2009  . Anterior lat lumbar fusion  12/25/2011    Procedure: ANTERIOR LATERAL LUMBAR FUSION 1 LEVEL;  Surgeon: Eustace Moore, MD;  Location: [facility];  Service: Neurosurgery;  Laterality: Left;  LUMBAR FOUR-FIVE  . Lumbar percutaneous pedicle screw 1 level  12/25/2011    Procedure: LUMBAR PERCUTANEOUS PEDICLE SCREW 1 LEVEL;  Surgeon: Eustace Moore, MD;  Location: [facility];  Service: Neurosurgery;  Laterality: Left;  LUMBAR FOUR-FIVE  . Hysteroscopy w/d&c  04-04-2008  . Transthoracic echocardiogram  10-09-2010    mild LVH,  ef 60-65%,  grade I diastolic dysfunction,  very mild AV stenosis (area 1.48cm/S/2(VTI) with no regurg./  mild MV  calcification without stenosis or regur. /  trivial TR  . Carpal tunnel release Right April 2015    Allergies  Allergen Reactions  . Other Anaphylaxis    Bee stings  . Penicillins Shortness Of Breath, Swelling and Rash  . Codeine Nausea And Vomiting    REACTION: severe   . Gluten Meal Swelling and Other (See Comments)    Inflammation  (this a trigger due to Psoriatic arthritis    Family History: family history includes Arthritis in her mother; Diabetes in her maternal grandmother; Hypertension in her brother, maternal grandfather, and maternal grandmother; Other in her brother and father; Psoriasis in her mother. There is no history of Heart disease or Cancer.  Social History:  reports that she has been smoking Cigarettes.  She has a 3.5 pack-year smoking history. She has never used smokeless tobacco. She reports that she drinks about 3.5 oz of alcohol per week. She reports that she does not use illicit drugs.  Review of systems: See HPI.  Admission Physical Exam:    Body mass index is 22.61 kg/(m^2).  Height 5\' 6"  (1.676 m), weight 140 lb (63.504 kg).  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 She understands the indications for surgical procedure. She accepts the risks of, but not limited to, and anesthetic complications, bleeding, infections, and possible damage to the  surrounding organs.   Eli Hose 08/27/2014

## 2014-08-28 ENCOUNTER — Encounter (HOSPITAL_COMMUNITY): Payer: Self-pay | Admitting: Obstetrics and Gynecology

## 2014-08-28 ENCOUNTER — Ambulatory Visit (HOSPITAL_BASED_OUTPATIENT_CLINIC_OR_DEPARTMENT_OTHER)
Admission: RE | Admit: 2014-08-28 | Discharge: 2014-08-28 | Disposition: A | Discharge status: Home / Self Care | Payer: BLUE CROSS/BLUE SHIELD | Source: Ambulatory Visit | Attending: Obstetrics and Gynecology | Admitting: Obstetrics and Gynecology

## 2014-08-28 ENCOUNTER — Ambulatory Visit (HOSPITAL_BASED_OUTPATIENT_CLINIC_OR_DEPARTMENT_OTHER): Payer: BLUE CROSS/BLUE SHIELD | Admitting: Anesthesiology

## 2014-08-28 ENCOUNTER — Encounter (HOSPITAL_BASED_OUTPATIENT_CLINIC_OR_DEPARTMENT_OTHER)
Admission: RE | Disposition: A | Discharge status: Home / Self Care | Payer: Self-pay | Source: Ambulatory Visit | Attending: Obstetrics and Gynecology

## 2014-08-28 ENCOUNTER — Encounter (HOSPITAL_BASED_OUTPATIENT_CLINIC_OR_DEPARTMENT_OTHER): Payer: Self-pay | Admitting: *Deleted

## 2014-08-28 ENCOUNTER — Other Ambulatory Visit: Payer: Self-pay

## 2014-08-28 DIAGNOSIS — Z86010 Personal history of colonic polyps: Secondary | ICD-10-CM | POA: Insufficient documentation

## 2014-08-28 DIAGNOSIS — E039 Hypothyroidism, unspecified: Secondary | ICD-10-CM | POA: Insufficient documentation

## 2014-08-28 DIAGNOSIS — F17210 Nicotine dependence, cigarettes, uncomplicated: Secondary | ICD-10-CM | POA: Insufficient documentation

## 2014-08-28 DIAGNOSIS — R87810 Cervical high risk human papillomavirus (HPV) DNA test positive: Secondary | ICD-10-CM | POA: Diagnosis not present

## 2014-08-28 DIAGNOSIS — D069 Carcinoma in situ of cervix, unspecified: Secondary | ICD-10-CM

## 2014-08-28 DIAGNOSIS — I350 Nonrheumatic aortic (valve) stenosis: Secondary | ICD-10-CM | POA: Insufficient documentation

## 2014-08-28 DIAGNOSIS — E785 Hyperlipidemia, unspecified: Secondary | ICD-10-CM | POA: Insufficient documentation

## 2014-08-28 DIAGNOSIS — L4050 Arthropathic psoriasis, unspecified: Secondary | ICD-10-CM | POA: Insufficient documentation

## 2014-08-28 DIAGNOSIS — K219 Gastro-esophageal reflux disease without esophagitis: Secondary | ICD-10-CM | POA: Insufficient documentation

## 2014-08-28 DIAGNOSIS — K5730 Diverticulosis of large intestine without perforation or abscess without bleeding: Secondary | ICD-10-CM | POA: Insufficient documentation

## 2014-08-28 HISTORY — DX: Family history of other specified conditions: Z84.89

## 2014-08-28 HISTORY — DX: Personal history of colonic polyps: Z86.010

## 2014-08-28 HISTORY — DX: Papillomavirus as the cause of diseases classified elsewhere: B97.7

## 2014-08-28 HISTORY — DX: Personal history of adenomatous and serrated colon polyps: Z86.0101

## 2014-08-28 HISTORY — PX: CERVICAL CONIZATION W/BX: SHX1330

## 2014-08-28 HISTORY — DX: Presence of spectacles and contact lenses: Z97.3

## 2014-08-28 HISTORY — DX: Psoriasis, unspecified: L40.9

## 2014-08-28 HISTORY — DX: Cardiac murmur, unspecified: R01.1

## 2014-08-28 HISTORY — DX: Unspecified abnormal cytological findings in specimens from cervix uteri: R87.619

## 2014-08-28 HISTORY — DX: Nonrheumatic aortic (valve) stenosis: I35.0

## 2014-08-28 SURGERY — CONE BIOPSY, CERVIX
Anesthesia: Monitor Anesthesia Care | Site: Cervix

## 2014-08-28 MED ORDER — PROMETHAZINE HCL 25 MG/ML IJ SOLN
6.2500 mg | INTRAMUSCULAR | Status: DC | PRN
  Filled 2014-08-28: qty 1

## 2014-08-28 MED ORDER — MIDAZOLAM HCL 2 MG/2ML IJ SOLN
INTRAMUSCULAR | Status: AC
  Filled 2014-08-28: qty 2

## 2014-08-28 MED ORDER — KETOROLAC TROMETHAMINE 30 MG/ML IJ SOLN
30.0000 mg | Freq: Once | INTRAMUSCULAR | Status: AC
  Administered 2014-08-28: 30 mg via INTRAVENOUS
  Filled 2014-08-28: qty 1

## 2014-08-28 MED ORDER — IODINE STRONG (LUGOLS) 5 % PO SOLN
ORAL | Status: DC | PRN
  Administered 2014-08-28: 0.1 mL

## 2014-08-28 MED ORDER — ACETAMINOPHEN 10 MG/ML IV SOLN
INTRAVENOUS | Status: DC | PRN
  Administered 2014-08-28: 1000 mg via INTRAVENOUS

## 2014-08-28 MED ORDER — HYDROCODONE-ACETAMINOPHEN 7.5-325 MG PO TABS
1.0000 | ORAL_TABLET | Freq: Once | ORAL | Status: DC | PRN
  Filled 2014-08-28: qty 1

## 2014-08-28 MED ORDER — FENTANYL CITRATE (PF) 100 MCG/2ML IJ SOLN
INTRAMUSCULAR | Status: DC | PRN
  Administered 2014-08-28: 25 ug via INTRAVENOUS
  Administered 2014-08-28: 50 ug via INTRAVENOUS
  Administered 2014-08-28: 25 ug via INTRAVENOUS

## 2014-08-28 MED ORDER — OXYCODONE-ACETAMINOPHEN 5-325 MG PO TABS: 1.0000 | ORAL_TABLET | ORAL | Status: DC | PRN

## 2014-08-28 MED ORDER — HEMOSTATIC AGENTS (NO CHARGE) OPTIME: TOPICAL | Status: DC | PRN

## 2014-08-28 MED ORDER — GELATIN ABSORBABLE 12-7 MM EX MISC
CUTANEOUS | Status: DC | PRN
  Administered 2014-08-28: 1 via TOPICAL

## 2014-08-28 MED ORDER — PROPOFOL 500 MG/50ML IV EMUL
INTRAVENOUS | Status: DC | PRN
  Administered 2014-08-28: 75 ug/kg/min via INTRAVENOUS

## 2014-08-28 MED ORDER — HYDROMORPHONE HCL 1 MG/ML IJ SOLN
0.2500 mg | INTRAMUSCULAR | Status: DC | PRN
  Filled 2014-08-28: qty 1

## 2014-08-28 MED ORDER — LACTATED RINGERS IV SOLN
INTRAVENOUS | Status: DC
  Administered 2014-08-28: 11:00:00 via INTRAVENOUS
  Filled 2014-08-28: qty 1000

## 2014-08-28 MED ORDER — 0.9 % SODIUM CHLORIDE (POUR BTL) OPTIME
TOPICAL | Status: DC | PRN
  Administered 2014-08-28: 20 mL

## 2014-08-28 MED ORDER — MIDAZOLAM HCL 5 MG/5ML IJ SOLN
INTRAMUSCULAR | Status: DC | PRN
Start: 2014-08-28 — End: 2014-08-28
  Administered 2014-08-28: 2 mg via INTRAVENOUS

## 2014-08-28 MED ORDER — FENTANYL CITRATE (PF) 100 MCG/2ML IJ SOLN
INTRAMUSCULAR | Status: AC
  Filled 2014-08-28: qty 6

## 2014-08-28 MED ORDER — BUPIVACAINE-EPINEPHRINE 0.5% -1:200000 IJ SOLN
INTRAMUSCULAR | Status: DC | PRN
  Administered 2014-08-28: 20 mL

## 2014-08-28 SURGICAL SUPPLY — 32 items
APPLICATOR COTTON TIP 6IN STRL (MISCELLANEOUS) IMPLANT
BLADE SURG 11 STRL SS (BLADE) ×2 IMPLANT
CONTAINER PREFILL 10% NBF 60ML (FORM) ×2 IMPLANT
COVER BACK TABLE 60X90IN (DRAPES) IMPLANT
DRAPE LG THREE QUARTER DISP (DRAPES) ×2 IMPLANT
DRAPE UNDERBUTTOCKS STRL (DRAPE) ×2 IMPLANT
DRESSING TELFA ISLAND 4X8 (GAUZE/BANDAGES/DRESSINGS) ×2 IMPLANT
ELECT REM PT RETURN 9FT ADLT (ELECTROSURGICAL) ×2
ELECTRODE REM PT RTRN 9FT ADLT (ELECTROSURGICAL) IMPLANT
GLOVE BIOGEL PI IND STRL 7.5 (GLOVE) IMPLANT
GLOVE BIOGEL PI IND STRL 8.5 (GLOVE) ×1 IMPLANT
GLOVE BIOGEL PI INDICATOR 7.5 (GLOVE) ×2
GLOVE BIOGEL PI INDICATOR 8.5 (GLOVE) ×1
GLOVE ECLIPSE 8.0 STRL XLNG CF (GLOVE) ×4 IMPLANT
GLOVE SURG SS PI 7.5 STRL IVOR (GLOVE) ×1 IMPLANT
GOWN STRL REUS W/TWL LRG LVL3 (GOWN DISPOSABLE) ×4 IMPLANT
LEGGING LITHOTOMY PAIR STRL (DRAPES) ×2 IMPLANT
NS IRRIG 500ML POUR BTL (IV SOLUTION) ×2 IMPLANT
PACK BASIN DAY SURGERY FS (CUSTOM PROCEDURE TRAY) ×2 IMPLANT
PAD OB MATERNITY 4.3X12.25 (PERSONAL CARE ITEMS) ×2 IMPLANT
PAD PREP 24X48 CUFFED NSTRL (MISCELLANEOUS) ×2 IMPLANT
PENCIL BUTTON HOLSTER BLD 10FT (ELECTRODE) IMPLANT
SCOPETTES 8  STERILE (MISCELLANEOUS) ×2
SCOPETTES 8 STERILE (MISCELLANEOUS) ×2 IMPLANT
SPONGE SURGIFOAM ABS GEL 12-7 (HEMOSTASIS) IMPLANT
SUT VICRYL 0 UR6 27IN ABS (SUTURE) ×5 IMPLANT
SYR TB 1ML 25GX5/8 (SYRINGE) IMPLANT
TOWEL OR 17X24 6PK STRL BLUE (TOWEL DISPOSABLE) ×4 IMPLANT
TRAY DSU PREP LF (CUSTOM PROCEDURE TRAY) ×2 IMPLANT
TUBE CONNECTING 12X1/4 (SUCTIONS) ×1 IMPLANT
WATER STERILE IRR 500ML POUR (IV SOLUTION) ×1 IMPLANT
YANKAUER SUCT BULB TIP NO VENT (SUCTIONS) ×1 IMPLANT

## 2014-08-28 NOTE — Anesthesia Preprocedure Evaluation (Addendum)
Anesthesia Evaluation  Patient identified by MRN, date of birth, ID band Patient awake    Reviewed: Allergy & Precautions, NPO status , Patient's Chart, lab work & pertinent test results, reviewed documented beta blocker date and time   Airway Mallampati: II  TM Distance: >3 FB Neck ROM: Full    Dental  (+) Teeth Intact   Pulmonary Current Smoker,  breath sounds clear to auscultation        Cardiovascular METS: 5 - 7 Mets - angina- DOE + Valvular Problems/Murmurs AS Rhythm:Regular Rate:Normal  Echo 2012: Mild AS. Normal EF   Neuro/Psych negative neurological ROS     GI/Hepatic Neg liver ROS, GERD-  ,  Endo/Other  Hypothyroidism   Renal/GU negative Renal ROS     Musculoskeletal   Abdominal   Peds  Hematology negative hematology ROS (+)   Anesthesia Other Findings   Reproductive/Obstetrics                           Anesthesia Physical Anesthesia Plan  ASA: II  Anesthesia Plan: MAC   Post-op Pain Management:    Induction: Intravenous  Airway Management Planned: Natural Airway and Simple Face Mask  Additional Equipment:   Intra-op Plan:   Post-operative Plan:   Informed Consent: I have reviewed the patients History and Physical, chart, labs and discussed the procedure including the risks, benefits and alternatives for the proposed anesthesia with the patient or authorized representative who has indicated his/her understanding and acceptance.     Plan Discussed with: CRNA  Anesthesia Plan Comments:        Anesthesia Quick Evaluation

## 2014-08-28 NOTE — H&P (Signed)
The patient was interviewed and examined today.  The previously documented history and physical examination was reviewed. There are no changes. The operative procedure was reviewed. The risks and benefits were outlined again. The specific risks include, but are not limited to, anesthetic complications, bleeding, infections, and possible damage to the surrounding organs. The patient's questions were answered.  We are ready to proceed as outlined. The likelihood of the patient achieving the goals of this procedure is very likely.   BP 139/90 mmHg  Pulse 72  Temp(Src) 98.7 F (37.1 C) (Oral)  Resp 12  Ht 5\' 6"  (1.676 m)  Wt 144 lb (65.318 kg)  BMI 23.25 kg/m2  SpO2 98% CBC    Component Value Date/Time   WBC 8.3 08/23/2014 1416   RBC 4.04 08/23/2014 1416   HGB 13.6 08/23/2014 1416   HCT 39.2 08/23/2014 1416   PLT 253 08/23/2014 1416   MCV 97.0 08/23/2014 1416   MCH 33.7 08/23/2014 1416   MCHC 34.7 08/23/2014 1416   RDW 12.9 08/23/2014 1416   LYMPHSABS 2.1 07/25/2014 0922   MONOABS 0.3 07/25/2014 0922   EOSABS 0.1 07/25/2014 0922   BASOSABS 0.0 07/25/2014 9093    Gildardo Cranker, M.D.

## 2014-08-28 NOTE — Transfer of Care (Signed)
Immediate Anesthesia Transfer of Care Note  Patient: April Cox  Procedure(s) Performed: Procedure(s) (LRB): CONIZATION CERVIX WITH BIOPSY (N/A)  Patient Location: PACU  Anesthesia Type: MAC  Level of Consciousness: awake, alert , oriented and patient cooperative  Airway & Oxygen Therapy: Patient Spontanous Breathing and Patient connected to face mask oxygen  Post-op Assessment: Report given to PACU RN and Post -op Vital signs reviewed and stable  Post vital signs: Reviewed and stable  Complications: No apparent anesthesia complications

## 2014-08-28 NOTE — Op Note (Signed)
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  Assistant:  None  Anesthetic:  Monitor Anesthesia Care  Disposition:  MAC  Findings:  The uterus was normal size. No adnexal masses were appreciated. There is no parametrial thickening. There were no nonstaining areas on the cervix after application of Lugol's solution.  Procedure:  The patient was taken to the operating room where she was given medication to her IV line. Adequate pain control was achieved. The patient's perineum and vagina were prepped with Betadine. The bladder was drained of urine. The patient was sterilely draped. Examination under anesthesia was performed. A paracervical block was placed using half percent Marcaine with epinephrine. Lugol solution was applied to the vagina and the cervix. The cervix was noted to be flush with the apex of the vagina. 10 cc of half percent Marcaine with epinephrine were injected directly into the cervix. Angle sutures were placed at the 3:00 and 9:00 position. The cervix was sounded. A circumferential incision was made around the endocervical os. The incision was extended in a conelike fashion to the endocervix. A stitch was placed at the 12:00 position of the specimen. Inverting sutures were placed at the 12:00, 3:00, 6:00, and 9:00 positions of the cervix. Hemostasis was adequate. Gelfoam was placed in the endocervix. All incisions were removed. Sponge, needle, and isthmic counts were correct. The estimated blood loss was 10 cc. The patient tolerated her procedure well. She was awakened from her and a static without difficulty. She was transported to the recovery room  in stable condition. The conization was sent to pathology.  Followup instructions:  The patient will return to see Dr. Raphael Gibney in 2 weeks. She was given a copy of the postoperative instruction sheet for patients who've undergone a conization of the cervix.  Discharge medications:  Motrin 800 mg one tablet every 8 hours as needed for mild to moderate pain Percocet one or 2 tablets every 4 hours as needed for severe pain.  Gildardo Cranker, M.D. 08/28/2014

## 2014-08-28 NOTE — Discharge Instructions (Signed)
Conization of the Cervix   Cervical conization is the cutting (excision) of a cone-shaped portion of the cervix. The procedure is performed through the vagina in either your health care provider's office or an operating room. This procedure is usually done when there is abnormal bleeding from the cervix. It can also be done to evaluate an abnormal Pap test or if an abnormality is seen on the cervix during an exam. The tissue is then examined to see if there are precancerous cells or cancer present.  Conization of the cervix is not done during a menstrual period or pregnancy.  LET [facility] CARE PROVIDER KNOW ABOUT:  Any allergies you have.   All medicines you are taking, including vitamins, herbs, eye drops, creams, and over-the-counter medicines.   Previous problems you or members of your family have had with the use of anesthetics.   Any blood disorders you have.   Previous surgeries you have had.   Medical conditions you have.   Your smoking habits.   The possibility of being pregnant.  RISKS AND COMPLICATIONS  Generally, conization of the cervix is a safe procedure. However, as with any procedure, complications can occur. Possible complications include:  Heavy bleeding several days or weeks after the procedure. Light bleeding or spotting after the procedure is normal.  Infection (rare).  Damage to the cervix or surrounding organs (uncommon).   Problems with the anesthesia.   Increased risk of preterm labor in future pregnancies. BEFORE THE PROCEDURE  Do not eat or drink anything for 6-8 hours before the procedure.   Do not take aspirin or blood thinners for at least a week before the procedure or as directed by your health care provider.   Arrange for someone to take you home after the procedure.  PROCEDURE There are three different methods to perform conization of the cervix. These include:   The cold knife method. In this method a small cone-shaped  sample of tissue is cut out with a knife (scalpel) from the cervical canal and the transformation zone (where the normal cells end and the abnormal cells begin).   The LEEP method. In this method a small cone-shaped sample of tissue is cut out with a thin wire that can burn (cauterize) the cervical tissue with an electrical current.   Laser treatment. In this method a small cone-shaped sample of tissue is cut out and then cauterized with a laser beam to prevent bleeding.  The procedure will be performed as follows:   Depending on the method, you will either be given a medicine to make you sleep (general anesthetic) or a numbing medicine (local anesthetic). A medicine that numbs the cervix (cervical block) may be given.   A lubricated device called a speculum will be inserted into the vagina to spread open the walls of the vagina. This will help your health care provider see the inside of the vagina and cervix better.   The tissue from the cervix will be removed and examined.   The results of the procedure will help your health care provider decide if further treatment is necessary. They will also help your health care provider decide on the best treatment if your results are abnormal. AFTER THE PROCEDURE  If you had a general anesthetic, you may be groggy for 2-3 hours after the procedure.   If you had a local anesthetic, you will rest at the clinic or hospital until you are stable and feel ready to go home.   Recovery may  take up to 3 weeks.   You may have some cramping for about 1 week.   You may have bloody discharge or light bleeding for 1-2 weeks.   You may have black discharge coming from the vagina. This is from the paste used on the cervix to prevent bleeding. This is normal discharge.  Document Released: 10/15/2004 Document Revised: 01/10/2013 Document Reviewed: 07/01/2012 [facility] Patient Information 2015 [place], [place]. This information is not intended to replace  advice given to you by your health care provider. Make sure you discuss any questions you have with your health care provider.      Post Anesthesia Home Care Instructions  Activity: Get plenty of rest for the remainder of the day. A responsible adult should stay with you for 24 hours following the procedure.  For the next 24 hours, DO NOT: -Drive a car -Paediatric nurse -Drink alcoholic beverages -Take any medication unless instructed by your physician -Make any legal decisions or sign important papers.  Meals: Start with liquid foods such as gelatin or soup. Progress to regular foods as tolerated. Avoid greasy, spicy, heavy foods. If nausea and/or vomiting occur, drink only clear liquids until the nausea and/or vomiting subsides. Call your physician if vomiting continues.  Special Instructions/Symptoms: Your throat may feel dry or sore from the anesthesia or the breathing tube placed in your throat during surgery. If this causes discomfort, gargle with warm salt water. The discomfort should disappear within 24 hours.  If you had a scopolamine patch placed behind your ear for the management of post- operative nausea and/or vomiting:  1. The medication in the patch is effective for 72 hours, after which it should be removed.  Wrap patch in a tissue and discard in the trash. Wash hands thoroughly with soap and water. 2. You may remove the patch earlier than 72 hours if you experience unpleasant side effects which may include dry mouth, dizziness or visual disturbances. 3. Avoid touching the patch. Wash your hands with soap and water after contact with the patch.

## 2014-08-29 ENCOUNTER — Encounter (HOSPITAL_BASED_OUTPATIENT_CLINIC_OR_DEPARTMENT_OTHER): Payer: Self-pay | Admitting: Obstetrics and Gynecology

## 2014-08-29 NOTE — Anesthesia Postprocedure Evaluation (Signed)
  Anesthesia Post-op Note  Patient: April Cox  Procedure(s) Performed: Procedure(s): CONIZATION CERVIX WITH BIOPSY (N/A)  Patient Location: PACU  Anesthesia Type:MAC  Level of Consciousness: awake, alert  and oriented  Airway and Oxygen Therapy: Patient Spontanous Breathing  Post-op Pain: none  Post-op Assessment: Post-op Vital signs reviewed              Post-op Vital Signs: Reviewed  Last Vitals:  Filed Vitals:   08/28/14 1327  BP: 114/43  Pulse: 56  Temp: 36.4 C  Resp: 16    Complications: No apparent anesthesia complications

## 2014-09-05 ENCOUNTER — Encounter (HOSPITAL_COMMUNITY): Payer: BLUE CROSS/BLUE SHIELD

## 2014-09-06 ENCOUNTER — Other Ambulatory Visit: Payer: Self-pay | Admitting: Family Medicine

## 2014-09-10 ENCOUNTER — Ambulatory Visit (INDEPENDENT_AMBULATORY_CARE_PROVIDER_SITE_OTHER): Payer: BLUE CROSS/BLUE SHIELD | Admitting: Family Medicine

## 2014-09-10 ENCOUNTER — Encounter: Payer: Self-pay | Admitting: Family Medicine

## 2014-09-10 VITALS — BP 120/70 | HR 73 | Ht 66.0 in | Wt 144.0 lb

## 2014-09-10 DIAGNOSIS — Z0000 Encounter for general adult medical examination without abnormal findings: Secondary | ICD-10-CM | POA: Diagnosis not present

## 2014-09-10 DIAGNOSIS — K219 Gastro-esophageal reflux disease without esophagitis: Secondary | ICD-10-CM | POA: Diagnosis not present

## 2014-09-10 DIAGNOSIS — Z720 Tobacco use: Secondary | ICD-10-CM | POA: Diagnosis not present

## 2014-09-10 DIAGNOSIS — Z86010 Personal history of colonic polyps: Secondary | ICD-10-CM | POA: Diagnosis not present

## 2014-09-10 DIAGNOSIS — F17210 Nicotine dependence, cigarettes, uncomplicated: Secondary | ICD-10-CM

## 2014-09-10 DIAGNOSIS — I10 Essential (primary) hypertension: Secondary | ICD-10-CM | POA: Insufficient documentation

## 2014-09-10 DIAGNOSIS — Z8619 Personal history of other infectious and parasitic diseases: Secondary | ICD-10-CM | POA: Diagnosis not present

## 2014-09-10 DIAGNOSIS — L4050 Arthropathic psoriasis, unspecified: Secondary | ICD-10-CM

## 2014-09-10 DIAGNOSIS — E038 Other specified hypothyroidism: Secondary | ICD-10-CM

## 2014-09-10 DIAGNOSIS — E785 Hyperlipidemia, unspecified: Secondary | ICD-10-CM | POA: Diagnosis not present

## 2014-09-10 DIAGNOSIS — M810 Age-related osteoporosis without current pathological fracture: Secondary | ICD-10-CM | POA: Insufficient documentation

## 2014-09-10 DIAGNOSIS — M8580 Other specified disorders of bone density and structure, unspecified site: Secondary | ICD-10-CM | POA: Diagnosis not present

## 2014-09-10 LAB — LIPID PANEL
CHOL/HDL RATIO: 5.7 ratio — AB (ref <=5.0)
CHOLESTEROL: 205 mg/dL — AB (ref 125–200)
HDL: 36 mg/dL — ABNORMAL LOW (ref >=46)
LDL Cholesterol: 144 mg/dL — ABNORMAL HIGH (ref <130)
Triglycerides: 126 mg/dL (ref <150)
VLDL: 25 mg/dL (ref <30)

## 2014-09-10 LAB — TSH: TSH: 1.886 u[IU]/mL (ref 0.350–4.500)

## 2014-09-10 MED ORDER — NEBIVOLOL HCL 5 MG PO TABS
ORAL_TABLET | ORAL | Status: DC
Start: 2014-09-10 — End: 2016-01-08

## 2014-09-10 MED ORDER — EPINEPHRINE 0.3 MG/0.3ML IJ SOAJ: 0.3000 mg | Freq: Once | INTRAMUSCULAR | Status: DC

## 2014-09-10 MED ORDER — OMEPRAZOLE 40 MG PO CPDR: 40.0000 mg | DELAYED_RELEASE_CAPSULE | Freq: Every day | ORAL | Status: DC

## 2014-09-10 MED ORDER — ATORVASTATIN CALCIUM 40 MG PO TABS: 40.0000 mg | ORAL_TABLET | Freq: Every morning | ORAL | Status: DC

## 2014-09-10 MED ORDER — LEVOTHYROXINE SODIUM 25 MCG PO TABS: 25.0000 ug | ORAL_TABLET | Freq: Every day | ORAL | Status: DC

## 2014-09-10 NOTE — Progress Notes (Signed)
   Subjective:    Patient ID: April Cox, female    DOB: Dec 19, 1955, 59 y.o.   MRN: [id number]  HPI She is here for complete examination. She has a history of abnormal Pap smears and is scheduled for hysterectomy in November.She has a history of HPV. Prior to that she will go in for colonoscopy to follow-up on colon polyps. She is followed by rheumatology for her psoriatic arthritis and is doing well on Remicade and methotrexate. She gets routine blood work done for this.She states that this combination is working very very well. She is taking Lipitor for hyperlipidemia and doing well on this. She takes low-dose Synthroid.She recently had a DEXA scan done which did show osteopenia and presently she is on vitamin D and calcium supplements. She continues on Bystolic and does need a refill on that. She does have underlying reflux and is using Prilosec more or less on an as-needed basis. She does find that she needs it more around the time she takes her methotrexate. She has started smoking again and states that she now has a new QUIT date of 8/31.Mammogram scheduled for October. She also needs refill on her EpiPen. She does have a BM. Family and social history as well as health maintenance and immunizations were reviewed   Review of Systems  All other systems reviewed and are negative.      Objective:   Physical Exam Alert and in no distress. Tympanic membranes and canals are normal. Pharyngeal area is normal. Neck is supple without adenopathy or thyromegaly. Cardiac exam shows a regular sinus rhythm without murmurs or gallops. Lungs are clear to auscultation.        Assessment & Plan:  Routine general medical examination at a health care facility  Hyperlipidemia - Plan: atorvastatin (LIPITOR) 40 MG tablet, Lipid panel  Other specified hypothyroidism - Plan: levothyroxine (SYNTHROID) 25 MCG tablet, TSH  Psoriatic arthritis  Cigarette smoker  Gastroesophageal reflux disease without  esophagitis - Plan: omeprazole (PRILOSEC) 40 MG capsule  Osteopenia  History of colonic polyps  History of HPV infection  Essential hypertension - Plan: nebivolol (BYSTOLIC) 5 MG tablet Did get her to commit to quitting smoking again and have a quit date. This will correspond went a trip. She will continue on her other medications. Recommend using Prilosec on an as-needed basis. Reinforced the need to use the vitamin D and calcium.

## 2014-09-11 ENCOUNTER — Other Ambulatory Visit (HOSPITAL_COMMUNITY): Payer: Self-pay | Admitting: *Deleted

## 2014-09-12 ENCOUNTER — Other Ambulatory Visit: Payer: Self-pay | Admitting: Obstetrics and Gynecology

## 2014-09-12 ENCOUNTER — Ambulatory Visit (HOSPITAL_COMMUNITY)
Admission: RE | Admit: 2014-09-12 | Discharge: 2014-09-12 | Disposition: A | Discharge status: Home / Self Care | Payer: BLUE CROSS/BLUE SHIELD | Source: Ambulatory Visit | Attending: Rheumatology | Admitting: Rheumatology

## 2014-09-12 DIAGNOSIS — L4050 Arthropathic psoriasis, unspecified: Secondary | ICD-10-CM | POA: Insufficient documentation

## 2014-09-12 MED ORDER — SODIUM CHLORIDE 0.9 % IV SOLN
INTRAVENOUS | Status: DC
  Administered 2014-09-12: 09:00:00 via INTRAVENOUS

## 2014-09-12 MED ORDER — INFLIXIMAB 100 MG IV SOLR
300.0000 mg | INTRAVENOUS | Status: DC
  Administered 2014-09-12: 300 mg via INTRAVENOUS
  Filled 2014-09-12: qty 30

## 2014-09-12 MED ORDER — ACETAMINOPHEN 325 MG PO TABS: 650.0000 mg | ORAL_TABLET | ORAL | Status: DC

## 2014-09-12 MED ORDER — DIPHENHYDRAMINE HCL 25 MG PO CAPS: 50.0000 mg | ORAL_CAPSULE | ORAL | Status: DC

## 2014-09-12 NOTE — Progress Notes (Signed)
Unable to obtain labs today.  Called Amy at Dr. Arlean Hopping office and let her know and she stated that her labs were normal at the last infusion and she would notify the patient if they wanted her to go somewhere else to get labs redrawn, otherwise it is ok not to obtain them at short stay for this infusion.

## 2014-10-19 ENCOUNTER — Ambulatory Visit: Payer: BLUE CROSS/BLUE SHIELD | Admitting: Gynecologic Oncology

## 2014-10-22 ENCOUNTER — Ambulatory Visit: Payer: BLUE CROSS/BLUE SHIELD | Attending: Gynecologic Oncology | Admitting: Gynecologic Oncology

## 2014-10-22 ENCOUNTER — Encounter: Payer: Self-pay | Admitting: Gynecologic Oncology

## 2014-10-22 ENCOUNTER — Encounter: Payer: Self-pay | Admitting: *Deleted

## 2014-10-22 VITALS — BP 159/73 | HR 64 | Temp 98.1°F | Resp 18 | Ht 66.0 in | Wt 142.0 lb

## 2014-10-22 DIAGNOSIS — D069 Carcinoma in situ of cervix, unspecified: Secondary | ICD-10-CM | POA: Diagnosis present

## 2014-10-22 NOTE — Progress Notes (Signed)
Consult Note: Gyn-Onc  Consult was requested by Dr. Raphael Gibney for the evaluation of April Cox 59 y.o. 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 CIN III (cervical intraepithelial neoplasia IIII)    New Consult    Assessment/Plan:  April Cox  is a 59 y.o.  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   April Cox has inadequate residual cervical tissue to facilitate repeat cone or LEEP.   We will followup today's endocervical curette.  I do not appreciate any palpable abnormalities suggestive of a bulky cervical cancer, though cannot rule out a microinvasive endocervical lesion on my physical exam.  Thus with April Cox and that ideally we like to obtain histologic confirmation of no occult invasive disease prior to performing definitive hysterectomy because if there is invasive carcinoma a radical hysterectomy is typically necessary to clear margins from the cancer and avoid radiation optimize oncologic outcomes. However in her case, given her anatomy from repeated excisional procedures, I do not believe that this is feasible. Additionally due to no concerning findings on physical exam for bulky cancer I believe that the likelihood of a cut through hysterectomy is a low. Therefore, understanding an approximate 5% chance of an occult coexisting invasive carcinoma for which extrafascial hysterectomy would be inadequate, I believe it is reasonable and appropriate to proceed with extrafascial hysterectomy, with BSO, and definitive pathology.  I also had a lengthy discussion with April Cox that hysterectomy is not curative for high-risk HPV and lower genital tract dysplasia. I discussed that patients to have hysterectomy for cervical dysplasia have a 25% chance risk for recurrence dysplasia in the vaginal tissues requiring additional procedures such as laser or topical therapies or surgical  excision. I discussed that after hysterectomy April Cox requires ongoing surveillance paps annually for at least 10 years and interventions as appropriate if dysplasia is identified.  April Cox has a history of psoriatic arthritis and takes remicaid and methotrexate. I discussed that these medications can be associated with delayed wound healing or wound and infectious complications perioperatively.    I will have her coordinate surgery with Dr Raphael Gibney (and a urogynecologist for TVT if appropriate) if he would prefer to perform the surgery.   HPI: April Cox is a very pleasant 59 year old [id number] who is seen in consultation at the request of Dr Raphael Gibney for CIN3. The patient has a very long standing history of cervical dysplasia. April Cox's at least 30 years of intermittently abnormal Pap smears. April Cox's had more excisional procedures then April Cox can remember.  In therapy 2016 a Pap smear revealed CIN-3. This was followed up with colposcopy by Dr. Raphael Gibney in March 2016 at which time cervical biopsies at 5 and 12:00 were performed an endocervical curette. All 3 specimens revealed benign squamous mucosa with the endocervical curette showing only focal koilocytic atypia with HPV effect. No malignancy or dysplasia is identified. April Cox then underwent a cold knife conization of the cervix for diagnostic and therapeutic purposes on 08/28/2014. The specimen measured 2.8 x 1 x 0.7 cm. It revealed benign cervical mucosa with no intraepithelial lesion or malignancy. The patient is strongly desiring definitive treatment with hysterectomy. April Cox also desires a procedure on her bladder for urinary incontinence to be performed at the same time. April Cox would desires surgery take place in late November or early December.  April Cox is otherwise a fairly healthy woman though April Cox has a past history of  psoriatic arthritis for which April Cox actively takes methotrexate and Remicade. April Cox denies taking prednisone though April Cox takes this on a prn basis (but  none lately). April Cox has had no prior abdominal procedures, though has had an endometrial ablation for abnormal uterine bleeding in 2010.   Current Meds:  Outpatient Encounter Prescriptions as of 10/22/2014  Medication Sig  . atorvastatin (LIPITOR) 40 MG tablet Take 1 tablet (40 mg total) by mouth every morning.  . calcium-vitamin D (OSCAL WITH D) 500-200 MG-UNIT per tablet Take 1 tablet by mouth 2 (two) times daily.    . Cholecalciferol (VITAMIN D PO) Take by mouth daily.  . clobetasol ointment (TEMOVATE) 0.05 % as needed.   . fish oil-omega-3 fatty acids 1000 MG capsule Take 1 g by mouth daily.   . folic acid (FOLVITE) 1 MG tablet Take 2 mg by mouth daily.   Marland Kitchen ibuprofen (ADVIL,MOTRIN) 800 MG tablet   . inFLIXimab (REMICADE) 100 MG injection Inject into the vein every 6 (six) weeks.   Marland Kitchen levothyroxine (SYNTHROID) 25 MCG tablet Take 1 tablet (25 mcg total) by mouth daily before breakfast.  . MAGNESIUM PO Take by mouth 2 (two) times daily.  . Methotrexate, Anti-Rheumatic, (METHOTREXATE, PF, [place]) Inject 0.8 mg into the skin once a week. Saturday's  . nebivolol (BYSTOLIC) 5 MG tablet TAKE 1 TABLET (5 MG TOTAL) BY MOUTH DAILY.  Marland Kitchen omeprazole (PRILOSEC) 40 MG capsule Take 1 capsule (40 mg total) by mouth daily.  . vitamin C (ASCORBIC ACID) 250 MG tablet Take 250 mg by mouth daily.    Marland Kitchen EPINEPHrine 0.3 mg/0.3 mL IJ SOAJ injection Inject 0.3 mLs (0.3 mg total) into the muscle once. (Patient not taking: Reported on 10/22/2014)  . [DISCONTINUED] benazepril (LOTENSIN) 10 MG tablet Take by mouth.  . [DISCONTINUED] methotrexate 50 MG/2ML injection   . [DISCONTINUED] oxyCODONE-acetaminophen (PERCOCET/ROXICET) 5-325 MG tablet    No facility-administered encounter medications on file as of 10/22/2014.    Allergy:  Allergies  Allergen Reactions  . Other Anaphylaxis    Bee stings  . Penicillins Shortness Of Breath, Swelling and Rash  . Codeine Nausea And Vomiting    REACTION: severe   . Gluten Meal Swelling  and Other (See Comments)    Inflammation  (this a trigger due to Psoriatic arthritis    Social Hx:   Social History   Social History  . Marital Status: Married    Spouse Name: N/A  . Number of Children: N/A  . Years of Education: N/A   Occupational History  . Not on file.   Social History Main Topics  . Smoking status: Current Some Day Smoker -- 0.50 packs/day for 7 years    Types: Cigarettes    Last Attempt to Quit: 09/10/2014  . Smokeless tobacco: Never Used  . Alcohol Use: 3.5 oz/week    7 Standard drinks or equivalent per week     Comment: OCCASIONAL  . Drug Use: No  . Sexual Activity: Yes   Other Topics Concern  . Not on file   Social History Narrative    Past Surgical Hx:  Past Surgical History  Procedure Laterality Date  . Peroneal nerve decompression Left 12-27-2009    for neuropathy with  foot drop  . Colonoscopy  11-06-2009  . Anterior lat lumbar fusion  12/25/2011    Procedure: ANTERIOR LATERAL LUMBAR FUSION 1 LEVEL;  Surgeon: Eustace Moore, MD;  Location: [place] [facility];  Service: Neurosurgery;  Laterality: Left;  LUMBAR FOUR-FIVE  . Lumbar  percutaneous pedicle screw 1 level  12/25/2011    Procedure: LUMBAR PERCUTANEOUS PEDICLE SCREW 1 LEVEL;  Surgeon: Eustace Moore, MD;  Location: [place] [facility];  Service: Neurosurgery;  Laterality: Left;  LUMBAR FOUR-FIVE  . Hysteroscopy w/d&c  04-04-2008  . Transthoracic echocardiogram  10-09-2010    mild LVH,  ef 60-65%,  grade I diastolic dysfunction,  very mild AV stenosis (area 1.48cm/S/2(VTI) with no regurg./  mild MV calcification without stenosis or regur. /  trivial TR  . Carpal tunnel release Right April 2015  . Cervical conization w/bx N/A 08/28/2014    Procedure: CONIZATION CERVIX WITH BIOPSY;  Surgeon: Ena Dawley, MD;  Location: [facility];  Service: Gynecology;  Laterality: N/A;    Past Medical Hx:  Past Medical History  Diagnosis Date  . Psoriatic arthritis ([place])     Dr. Estanislado Pandy  .  Hyperlipidemia   . Hypothyroidism   . Diverticulosis of colon   . GERD (gastroesophageal reflux disease)   . Mild aortic valve stenosis     per echo 10-09-2010  ef 60-65%  valva area 1.48cm  . Psoriasis   . History of adenomatous polyp of colon     11-06-2009  tubullar  . Abnormal Pap smear of cervix   . HPV in female   . Family history of adverse reaction to anesthesia     mother PONV  . Heart murmur     asymptomatic per pt  . Wears glasses     Past Gynecological History:  30 year history of abnormal paps.  No LMP recorded. Patient is postmenopausal.  Family Hx:  Family History  Problem Relation Age of Onset  . Arthritis Mother     OA   . Psoriasis Mother   . Other Father     accidental death/MVA at age 94  . Hypertension Brother   . Other Brother     1 brother drowned  . Hypertension Maternal Grandmother   . Diabetes Maternal Grandmother   . Hypertension Maternal Grandfather   . Heart disease Neg Hx   . Cancer Neg Hx     Review of Systems:  Constitutional  Feels well,    ENT Normal appearing ears and nares bilaterally Skin/Breast  No rash, sores, jaundice, itching, dryness Cardiovascular  No chest pain, shortness of breath, or edema  Pulmonary  No cough or wheeze.  Gastro Intestinal  No nausea, vomitting, or diarrhoea. No bright red blood per rectum, no abdominal pain, change in bowel movement, or constipation.  Genito Urinary  No frequency, urgency, dysuria, see HPI Musculo Skeletal  No myalgia, arthralgia, joint swelling or pain  Neurologic  No weakness, numbness, change in gait,  Psychology  No depression, anxiety, insomnia.   Vitals:  Blood pressure 159/73, pulse 64, temperature 98.1 F (36.7 C), temperature source Oral, resp. rate 18, height 5\' 6"  (1.676 m), weight 142 lb (64.411 kg), SpO2 100 %.  Physical Exam: WD in NAD Neck  Supple NROM, without any enlargements.  Lymph Node Survey No cervical supraclavicular or inguinal  adenopathy Cardiovascular  Pulse normal rate, regularity and rhythm. S1 and S2 normal.  Lungs  Clear to auscultation bilateraly, without wheezes/crackles/rhonchi. Good air movement.  Skin  No rash/lesions/breakdown  Psychiatry  Alert and oriented to person, place, and time  Abdomen  Normoactive bowel sounds, abdomen soft, non-tender and thinwithout evidence of hernia.  Back No CVA tenderness Genito Urinary  Vulva/vagina: Normal external female genitalia.   No lesions. No discharge or bleeding.  Bladder/urethra:  No lesions or masses, well supported bladder  Vagina: normal  Cervix: Normal appearing but small and flush with vagina, no lesions.  Uterus:  Small, mobile, no parametrial involvement or nodularity.  Adnexa: no masses. Rectal  Good tone, no masses no cul de sac nodularity.  Extremities  No bilateral cyanosis, clubbing or edema.  PROCEDURE: ENDOCERVICAL CURRETTE Patient provided informed consent verbally. The kevorkian curette was used to comprehensively sample the endocervical canal and sent for pathology. Minimal bleeding occurred.   April Eva, MD  10/22/2014, 2:39 PM

## 2014-10-22 NOTE — Patient Instructions (Signed)
We will contact Dr. Raphael Gibney and see if he plans to perform your surgery or if he would prefer Dr. Denman George do the surgery. We will call you once a decision has been made about surgery and also when we have the results back from today. Please call our office sooner with any questions or concerns.

## 2014-10-23 ENCOUNTER — Other Ambulatory Visit (HOSPITAL_COMMUNITY): Payer: Self-pay | Admitting: *Deleted

## 2014-10-24 ENCOUNTER — Ambulatory Visit (HOSPITAL_COMMUNITY)
Admission: RE | Admit: 2014-10-24 | Discharge: 2014-10-24 | Disposition: A | Discharge status: Home / Self Care | Payer: BLUE CROSS/BLUE SHIELD | Source: Ambulatory Visit | Attending: Rheumatology | Admitting: Rheumatology

## 2014-10-24 DIAGNOSIS — L4050 Arthropathic psoriasis, unspecified: Secondary | ICD-10-CM | POA: Insufficient documentation

## 2014-10-24 LAB — CBC WITH DIFFERENTIAL/PLATELET
Basophils Absolute: 0 10*3/uL (ref 0.0–0.1)
Basophils Relative: 0 %
EOS PCT: 2 %
Eosinophils Absolute: 0.1 10*3/uL (ref 0.0–0.7)
HCT: 38.5 % (ref 36.0–46.0)
Hemoglobin: 13.3 g/dL (ref 12.0–15.0)
LYMPHS ABS: 2.6 10*3/uL (ref 0.7–4.0)
LYMPHS PCT: 32 %
MCH: 33 pg (ref 26.0–34.0)
MCHC: 34.5 g/dL (ref 30.0–36.0)
MCV: 95.5 fL (ref 78.0–100.0)
MONO ABS: 0.4 10*3/uL (ref 0.1–1.0)
Monocytes Relative: 5 %
Neutro Abs: 4.9 10*3/uL (ref 1.7–7.7)
Neutrophils Relative %: 61 %
PLATELETS: 282 10*3/uL (ref 150–400)
RBC: 4.03 MIL/uL (ref 3.87–5.11)
RDW: 12.4 % (ref 11.5–15.5)
WBC: 8 10*3/uL (ref 4.0–10.5)

## 2014-10-24 LAB — COMPREHENSIVE METABOLIC PANEL
ALT: 16 U/L (ref 14–54)
AST: 24 U/L (ref 15–41)
Albumin: 4 g/dL (ref 3.5–5.0)
Alkaline Phosphatase: 82 U/L (ref 38–126)
Anion gap: 8 (ref 5–15)
BUN: 7 mg/dL (ref 6–20)
CHLORIDE: 102 mmol/L (ref 101–111)
CO2: 26 mmol/L (ref 22–32)
CREATININE: 0.7 mg/dL (ref 0.44–1.00)
Calcium: 9.7 mg/dL (ref 8.9–10.3)
Glucose, Bld: 114 mg/dL — ABNORMAL HIGH (ref 65–99)
POTASSIUM: 3.8 mmol/L (ref 3.5–5.1)
Sodium: 136 mmol/L (ref 135–145)
TOTAL PROTEIN: 7.4 g/dL (ref 6.5–8.1)
Total Bilirubin: 0.8 mg/dL (ref 0.3–1.2)

## 2014-10-24 MED ORDER — SODIUM CHLORIDE 0.9 % IV SOLN
Freq: Once | INTRAVENOUS | Status: AC
  Administered 2014-10-24: 250 mL via INTRAVENOUS

## 2014-10-24 MED ORDER — ACETAMINOPHEN 325 MG PO TABS: 650.0000 mg | ORAL_TABLET | Freq: Once | ORAL | Status: DC

## 2014-10-24 MED ORDER — SODIUM CHLORIDE 0.9 % IV SOLN
300.0000 mg | Freq: Once | INTRAVENOUS | Status: AC
  Administered 2014-10-24: 300 mg via INTRAVENOUS
  Filled 2014-10-24: qty 30

## 2014-10-24 MED ORDER — DIPHENHYDRAMINE HCL 25 MG PO CAPS: 25.0000 mg | ORAL_CAPSULE | Freq: Once | ORAL | Status: DC

## 2014-10-24 NOTE — Discharge Instructions (Signed)
Excuse from Work, [employer], or Physical Activity _[name]___ needs to be excused from: __X___ Work _____ [employer] _____ Physical activity beginning now and through the following date: __10/5/2016__________________. _____ He or she may return to work or school but should still avoid the following physical activity or activities from now until __________________. Activity restrictions include: _____ Lifting more than _______ lb _____ Sitting longer than __________ minutes at a time _____ Standing longer than ________ minutes at a time ____x_ He or she may return to full physical activity as of __10/5/2016__________________. Health Care Provider Name (printed):Korbin Notaro Gillian Scarce, RN [phone] ________________________________________  Health Care Provider (signature): ___________________________________________ Date: ________________   This information is not intended to replace advice given to you by your health care provider. Make sure you discuss any questions you have with your health care provider.   Document Released: 07/01/2000 Document Revised: 01/26/2014 Document Reviewed: 08/07/2013 Elsevier Interactive Patient Education [employer].

## 2014-11-16 ENCOUNTER — Other Ambulatory Visit: Payer: Self-pay | Admitting: Obstetrics and Gynecology

## 2014-11-27 NOTE — Patient Instructions (Addendum)
Your procedure is scheduled on:  Wednesday, Nov. 30, 2016  Enter through the [employer] [facility] at:  11:30 A.M.  Pick up the phone at the desk and dial [phone].  Call this number if you have problems the morning of surgery: [phone].  Remember: Do NOT eat food:  MIDNIGHT Tuesday NOV. 29, 2016 Do NOT drink clear liquids after:  9:00 a.m. DAY OF SURGERY Take these medicines the morning of surgery with a SIP OF WATER:  ATORVASTATIN, LEVOTHYROXINE, BYSTOLIC, OMEPRAZOLE  *STOP TAKING FISH OIL AT THIS TIME  Do NOT wear jewelry (body piercing), metal hair clips/bobby pins, make-up, or nail polish. Do NOT wear lotions, powders, or perfumes.  You may wear deoderant. Do NOT shave for 48 hours prior to surgery. Do NOT bring valuables to the hospital. Contacts, dentures, or bridgework may not be worn into surgery. Leave suitcase in car.  After surgery it may be brought to your room.  For patients admitted to the hospital, checkout time is 11:00 AM the day of discharge.  *Bring a copy of healthcare power of attorney and living will day of surgery.

## 2014-11-28 ENCOUNTER — Encounter (HOSPITAL_COMMUNITY)
Admission: RE | Admit: 2014-11-28 | Discharge: 2014-11-28 | Disposition: A | Discharge status: Home / Self Care | Payer: BLUE CROSS/BLUE SHIELD | Source: Ambulatory Visit | Attending: Obstetrics and Gynecology | Admitting: Obstetrics and Gynecology

## 2014-11-28 ENCOUNTER — Encounter (HOSPITAL_COMMUNITY): Payer: Self-pay

## 2014-11-28 DIAGNOSIS — R32 Unspecified urinary incontinence: Secondary | ICD-10-CM | POA: Diagnosis not present

## 2014-11-28 DIAGNOSIS — D069 Carcinoma in situ of cervix, unspecified: Secondary | ICD-10-CM | POA: Insufficient documentation

## 2014-11-28 DIAGNOSIS — Z01818 Encounter for other preprocedural examination: Secondary | ICD-10-CM | POA: Diagnosis not present

## 2014-11-28 HISTORY — DX: Unspecified osteoarthritis, unspecified site: M19.90

## 2014-11-28 HISTORY — DX: Anemia, unspecified: D64.9

## 2014-11-28 HISTORY — DX: Essential (primary) hypertension: I10

## 2014-11-28 LAB — BASIC METABOLIC PANEL
Anion gap: 7 (ref 5–15)
BUN: 8 mg/dL (ref 6–20)
CHLORIDE: 103 mmol/L (ref 101–111)
CO2: 27 mmol/L (ref 22–32)
CREATININE: 0.61 mg/dL (ref 0.44–1.00)
Calcium: 9.5 mg/dL (ref 8.9–10.3)
GFR calc Af Amer: >60 mL/min (ref >60)
GFR calc non Af Amer: >60 mL/min (ref >60)
GLUCOSE: 98 mg/dL (ref 65–99)
POTASSIUM: 3.8 mmol/L (ref 3.5–5.1)
SODIUM: 137 mmol/L (ref 135–145)

## 2014-11-28 LAB — CBC
HEMATOCRIT: 38.4 % (ref 36.0–46.0)
Hemoglobin: 13.1 g/dL (ref 12.0–15.0)
MCH: 32.8 pg (ref 26.0–34.0)
MCHC: 34.1 g/dL (ref 30.0–36.0)
MCV: 96.2 fL (ref 78.0–100.0)
PLATELETS: 246 10*3/uL (ref 150–400)
RBC: 3.99 MIL/uL (ref 3.87–5.11)
RDW: 13.1 % (ref 11.5–15.5)
WBC: 9.2 10*3/uL (ref 4.0–10.5)

## 2014-12-18 MED ORDER — GENTAMICIN SULFATE 40 MG/ML IJ SOLN
INTRAVENOUS | Status: AC
  Administered 2014-12-19: 114 mL via INTRAVENOUS
  Filled 2014-12-18: qty 8

## 2014-12-18 NOTE — H&P (Addendum)
Admission History and Physical Exam for a Gynecology Patient  April Cox is a 59 y.o. female who presents for a vaginal hysterectomy, bilateral salpingectomy, TVT, and cystoscopy. She has been followed at the [facility]cology division of [facility]. The patient has a history of recurrent abnormal Pap smears.  In 2015 the patient had a Pap smear that showed high-grade changes.  Her HPV test was positive.  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  Colposcopy and biopsies were performed.  No pathology was found.  A conization of the cervix was performed.  The pathology specimen was said to be benign.  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  The patient was seen by GYN oncologist.  An endocervical curettage was performed that was benign.  The patient requested a hysterectomy.  The GYN oncologist agreed that that was a reasonable next step. The patient has had hysteroscopy with D&C.  The pathology report was benign.  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  She wants to proceed with TVT by Dr. Everett Graff.  OB History    No data available      Past Medical History  Diagnosis Date  . Psoriatic arthritis ([place])     Dr. Estanislado Pandy  . Hyperlipidemia   . Hypothyroidism   . Diverticulosis of colon   . Mild aortic valve stenosis     per echo 10-09-2010  ef 60-65%  valva area 1.48cm  . Psoriasis   . History of adenomatous polyp of colon     11-06-2009  tubullar  . Abnormal Pap smear of cervix   . HPV in female   . Family history of adverse reaction to anesthesia     mother PONV  . Wears glasses   . Hypertension   . Heart murmur     asymptomatic per pt  . GERD (gastroesophageal reflux disease)   . Arthritis   . Anemia     history of anemia    No prescriptions prior to admission    Past Surgical History  Procedure Laterality Date  .  Peroneal nerve decompression Left 12-27-2009    for neuropathy with  foot drop  . Colonoscopy  11-06-2009  . Anterior lat lumbar fusion  12/25/2011    Procedure: ANTERIOR LATERAL LUMBAR FUSION 1 LEVEL;  Surgeon: Eustace Moore, MD;  Location: [place] [facility];  Service: Neurosurgery;  Laterality: Left;  LUMBAR FOUR-FIVE  . Lumbar percutaneous pedicle screw 1 level  12/25/2011    Procedure: LUMBAR PERCUTANEOUS PEDICLE SCREW 1 LEVEL;  Surgeon: Eustace Moore, MD;  Location: [place] [facility];  Service: Neurosurgery;  Laterality: Left;  LUMBAR FOUR-FIVE  . Hysteroscopy w/d&c  04-04-2008  . Transthoracic echocardiogram  10-09-2010    mild LVH,  ef 60-65%,  grade I diastolic dysfunction,  very mild AV stenosis (area 1.48cm/S/2(VTI) with no regurg./  mild MV calcification without stenosis or regur. /  trivial TR  . Carpal tunnel release Right April 2015  . Cervical conization w/bx N/A 08/28/2014    Procedure: CONIZATION CERVIX WITH BIOPSY;  Surgeon: Ena Dawley, MD;  Location: [facility];  Service: Gynecology;  Laterality: N/A;  . Dental surgery      Allergies  Allergen Reactions  . Codeine Hives and Nausea And Vomiting    REACTION: severe   . Other Anaphylaxis    Bee stings  . Penicillins Shortness  Of Breath, Swelling and Rash  . Gluten Meal Swelling and Other (See Comments)    Inflammation  (this a trigger due to Psoriatic arthritis    Family History: family history includes Arthritis in her mother; Diabetes in her maternal grandmother; Hypertension in her brother, maternal grandfather, and maternal grandmother; Other in her brother and father; Psoriasis in her mother. There is no history of Heart disease or Cancer.  Social History:  reports that she has been smoking Cigarettes.  She has a 1.25 pack-year smoking history. She has never used smokeless tobacco. She reports that she drinks about 3.5 oz of alcohol per week. She reports that she does not use illicit drugs.  Review of  systems: See HPI.  Admission Physical Exam:    Body mass index is 22.67 kg/(m^2).  Height 5\' 6"  (1.676 m), weight 140 lb 6 oz (63.674 kg).  HEENT:                 Within normal limits Chest:                   Clear Heart:                    Regular rate and rhythm Breasts:                No masses, skin changes, bleeding, or discharge present Abdomen:             Nontender, no masses Extremities:          Grossly normal Neurologic exam: Grossly normal  Pelvic exam:  External genitalia: normal general appearance Vaginal: atrophic, cystocele, loss of urethrovesical angle Cervix: shortened by prior colonizations. Adnexa: normal bimanual exam Uterus: normal single, nontender Rectal: confirms, no parametrial disease.  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  She will also have a DVT followed by cystoscopy by Dr. Everett Graff.  She understands the indications for surgical procedure.  She accepts the risk of, but not limited to, anesthetic complications, bleeding, infections, and possible damage to surrounding organs.   Eli Hose 12/18/2014

## 2014-12-19 ENCOUNTER — Ambulatory Visit (HOSPITAL_COMMUNITY)
Admission: RE | Admit: 2014-12-19 | Discharge: 2014-12-20 | Disposition: A | Discharge status: Home / Self Care | Payer: BLUE CROSS/BLUE SHIELD | Source: Ambulatory Visit | Attending: Obstetrics and Gynecology | Admitting: Obstetrics and Gynecology

## 2014-12-19 ENCOUNTER — Ambulatory Visit (HOSPITAL_COMMUNITY): Payer: BLUE CROSS/BLUE SHIELD | Admitting: Anesthesiology

## 2014-12-19 ENCOUNTER — Encounter (HOSPITAL_COMMUNITY)
Admission: RE | Disposition: A | Discharge status: Home / Self Care | Payer: Self-pay | Source: Ambulatory Visit | Attending: Obstetrics and Gynecology

## 2014-12-19 ENCOUNTER — Encounter (HOSPITAL_COMMUNITY): Payer: Self-pay | Admitting: Anesthesiology

## 2014-12-19 DIAGNOSIS — D649 Anemia, unspecified: Secondary | ICD-10-CM | POA: Insufficient documentation

## 2014-12-19 DIAGNOSIS — N393 Stress incontinence (female) (male): Secondary | ICD-10-CM | POA: Diagnosis present

## 2014-12-19 DIAGNOSIS — F17210 Nicotine dependence, cigarettes, uncomplicated: Secondary | ICD-10-CM | POA: Diagnosis not present

## 2014-12-19 DIAGNOSIS — I10 Essential (primary) hypertension: Secondary | ICD-10-CM | POA: Insufficient documentation

## 2014-12-19 DIAGNOSIS — N8110 Cystocele, unspecified: Secondary | ICD-10-CM | POA: Insufficient documentation

## 2014-12-19 DIAGNOSIS — N3946 Mixed incontinence: Secondary | ICD-10-CM | POA: Insufficient documentation

## 2014-12-19 DIAGNOSIS — D069 Carcinoma in situ of cervix, unspecified: Secondary | ICD-10-CM | POA: Diagnosis not present

## 2014-12-19 DIAGNOSIS — D62 Acute posthemorrhagic anemia: Secondary | ICD-10-CM

## 2014-12-19 DIAGNOSIS — E039 Hypothyroidism, unspecified: Secondary | ICD-10-CM | POA: Insufficient documentation

## 2014-12-19 DIAGNOSIS — N8189 Other female genital prolapse: Secondary | ICD-10-CM

## 2014-12-19 HISTORY — PX: CYSTO: SHX6284

## 2014-12-19 HISTORY — PX: VAGINAL HYSTERECTOMY: SHX2639

## 2014-12-19 HISTORY — PX: BLADDER SUSPENSION: SHX72

## 2014-12-19 LAB — CBC
HCT: 31.8 % — ABNORMAL LOW (ref 36.0–46.0)
HEMOGLOBIN: 10.9 g/dL — AB (ref 12.0–15.0)
MCH: 32.4 pg (ref 26.0–34.0)
MCHC: 34.3 g/dL (ref 30.0–36.0)
MCV: 94.6 fL (ref 78.0–100.0)
PLATELETS: 191 10*3/uL (ref 150–400)
RBC: 3.36 MIL/uL — ABNORMAL LOW (ref 3.87–5.11)
RDW: 13.5 % (ref 11.5–15.5)
WBC: 7.6 10*3/uL (ref 4.0–10.5)

## 2014-12-19 LAB — CREATININE, SERUM
CREATININE: 0.55 mg/dL (ref 0.44–1.00)
GFR calc Af Amer: >60 mL/min (ref >60)

## 2014-12-19 SURGERY — HYSTERECTOMY, VAGINAL
Anesthesia: General | Site: Vagina

## 2014-12-19 MED ORDER — ROCURONIUM BROMIDE 100 MG/10ML IV SOLN
INTRAVENOUS | Status: DC | PRN
  Administered 2014-12-19: 40 mg via INTRAVENOUS

## 2014-12-19 MED ORDER — LIDOCAINE HCL (CARDIAC) 20 MG/ML IV SOLN: INTRAVENOUS | Status: DC | PRN

## 2014-12-19 MED ORDER — ONDANSETRON HCL 4 MG/2ML IJ SOLN: 4.0000 mg | Freq: Four times a day (QID) | INTRAMUSCULAR | Status: DC | PRN

## 2014-12-19 MED ORDER — GLYCOPYRROLATE 0.2 MG/ML IJ SOLN
INTRAMUSCULAR | Status: DC | PRN
  Administered 2014-12-19: .4 mg via INTRAVENOUS
  Administered 2014-12-19: 0.1 mg via INTRAVENOUS

## 2014-12-19 MED ORDER — NEOSTIGMINE METHYLSULFATE 10 MG/10ML IV SOLN
INTRAVENOUS | Status: AC
  Filled 2014-12-19: qty 1

## 2014-12-19 MED ORDER — LEVOTHYROXINE SODIUM 25 MCG PO TABS
25.0000 ug | ORAL_TABLET | Freq: Every day | ORAL | Status: DC
  Administered 2014-12-20: 25 ug via ORAL
  Filled 2014-12-19 (×2): qty 1

## 2014-12-19 MED ORDER — GLYCOPYRROLATE 0.2 MG/ML IJ SOLN
INTRAMUSCULAR | Status: AC
  Filled 2014-12-19: qty 1

## 2014-12-19 MED ORDER — MEPERIDINE HCL 25 MG/ML IJ SOLN
INTRAMUSCULAR | Status: AC
  Administered 2014-12-19: 6.25 mg via INTRAVENOUS
  Filled 2014-12-19: qty 1

## 2014-12-19 MED ORDER — PROPOFOL 10 MG/ML IV BOLUS
INTRAVENOUS | Status: AC
  Filled 2014-12-19: qty 20

## 2014-12-19 MED ORDER — ONDANSETRON HCL 4 MG/2ML IJ SOLN
INTRAMUSCULAR | Status: DC | PRN
  Administered 2014-12-19: 4 mg via INTRAVENOUS

## 2014-12-19 MED ORDER — DIPHENHYDRAMINE HCL 50 MG/ML IJ SOLN: 12.5000 mg | Freq: Four times a day (QID) | INTRAMUSCULAR | Status: DC | PRN

## 2014-12-19 MED ORDER — METOCLOPRAMIDE HCL 5 MG/ML IJ SOLN: 10.0000 mg | Freq: Once | INTRAMUSCULAR | Status: DC | PRN

## 2014-12-19 MED ORDER — KETOROLAC TROMETHAMINE 30 MG/ML IJ SOLN
30.0000 mg | Freq: Four times a day (QID) | INTRAMUSCULAR | Status: DC
  Administered 2014-12-19 – 2014-12-20 (×2): 30 mg via INTRAVENOUS
  Filled 2014-12-19 (×2): qty 1

## 2014-12-19 MED ORDER — SIMETHICONE 80 MG PO CHEW: 80.0000 mg | CHEWABLE_TABLET | Freq: Four times a day (QID) | ORAL | Status: DC | PRN

## 2014-12-19 MED ORDER — MIDAZOLAM HCL 2 MG/2ML IJ SOLN
INTRAMUSCULAR | Status: AC
  Filled 2014-12-19: qty 2

## 2014-12-19 MED ORDER — ATORVASTATIN CALCIUM 40 MG PO TABS
40.0000 mg | ORAL_TABLET | Freq: Every morning | ORAL | Status: DC
  Administered 2014-12-20: 40 mg via ORAL
  Filled 2014-12-19 (×2): qty 1

## 2014-12-19 MED ORDER — HYDROMORPHONE 1 MG/ML IV SOLN
INTRAVENOUS | Status: DC
  Administered 2014-12-19: 18:00:00 via INTRAVENOUS
  Administered 2014-12-19: 0.3 mg via INTRAVENOUS
  Filled 2014-12-19: qty 25

## 2014-12-19 MED ORDER — LACTATED RINGERS IV SOLN
INTRAVENOUS | Status: DC
  Administered 2014-12-19 (×3): via INTRAVENOUS

## 2014-12-19 MED ORDER — IBUPROFEN 800 MG PO TABS: 800.0000 mg | ORAL_TABLET | Freq: Three times a day (TID) | ORAL | Status: DC | PRN

## 2014-12-19 MED ORDER — BUPIVACAINE-EPINEPHRINE (PF) 0.5% -1:200000 IJ SOLN
INTRAMUSCULAR | Status: AC
  Filled 2014-12-19: qty 30

## 2014-12-19 MED ORDER — METHYLENE BLUE 1 % INJ SOLN
INTRAMUSCULAR | Status: AC
  Filled 2014-12-19: qty 1

## 2014-12-19 MED ORDER — SODIUM CHLORIDE 0.9 % IJ SOLN
INTRAMUSCULAR | Status: AC
  Filled 2014-12-19: qty 100

## 2014-12-19 MED ORDER — DEXAMETHASONE SODIUM PHOSPHATE 10 MG/ML IJ SOLN
INTRAMUSCULAR | Status: AC
  Filled 2014-12-19: qty 1

## 2014-12-19 MED ORDER — BUPIVACAINE-EPINEPHRINE 0.5% -1:200000 IJ SOLN
INTRAMUSCULAR | Status: DC | PRN
  Administered 2014-12-19: 9 mL
  Administered 2014-12-19: 16 mL

## 2014-12-19 MED ORDER — MEPERIDINE HCL 25 MG/ML IJ SOLN
6.2500 mg | INTRAMUSCULAR | Status: DC | PRN
  Administered 2014-12-19 (×2): 6.25 mg via INTRAVENOUS

## 2014-12-19 MED ORDER — KETOROLAC TROMETHAMINE 30 MG/ML IJ SOLN: 30.0000 mg | Freq: Four times a day (QID) | INTRAMUSCULAR | Status: DC

## 2014-12-19 MED ORDER — ESTRADIOL 0.1 MG/GM VA CREA
TOPICAL_CREAM | VAGINAL | Status: AC
  Filled 2014-12-19: qty 42.5

## 2014-12-19 MED ORDER — ROCURONIUM BROMIDE 100 MG/10ML IV SOLN
INTRAVENOUS | Status: AC
  Filled 2014-12-19: qty 1

## 2014-12-19 MED ORDER — FENTANYL CITRATE (PF) 250 MCG/5ML IJ SOLN
INTRAMUSCULAR | Status: AC
  Filled 2014-12-19: qty 5

## 2014-12-19 MED ORDER — ONDANSETRON HCL 4 MG/2ML IJ SOLN
INTRAMUSCULAR | Status: AC
  Filled 2014-12-19: qty 2

## 2014-12-19 MED ORDER — DIPHENHYDRAMINE HCL 12.5 MG/5ML PO ELIX: 12.5000 mg | ORAL_SOLUTION | Freq: Four times a day (QID) | ORAL | Status: DC | PRN

## 2014-12-19 MED ORDER — EPHEDRINE 5 MG/ML INJ
INTRAVENOUS | Status: AC
  Filled 2014-12-19: qty 10

## 2014-12-19 MED ORDER — HYDROMORPHONE HCL 1 MG/ML IJ SOLN
0.2500 mg | INTRAMUSCULAR | Status: DC | PRN
Start: 2014-12-19 — End: 2014-12-19

## 2014-12-19 MED ORDER — INFLUENZA VAC SPLIT QUAD 0.5 ML IM SUSY: 0.5000 mL | PREFILLED_SYRINGE | INTRAMUSCULAR | Status: DC

## 2014-12-19 MED ORDER — ESTRADIOL 0.1 MG/GM VA CREA
TOPICAL_CREAM | VAGINAL | Status: DC | PRN
  Administered 2014-12-19: 1 via VAGINAL

## 2014-12-19 MED ORDER — NALOXONE HCL 0.4 MG/ML IJ SOLN: 0.4000 mg | INTRAMUSCULAR | Status: DC | PRN

## 2014-12-19 MED ORDER — LIDOCAINE HCL (CARDIAC) 20 MG/ML IV SOLN
INTRAVENOUS | Status: DC | PRN
  Administered 2014-12-19: 50 mg via INTRAVENOUS

## 2014-12-19 MED ORDER — NEOSTIGMINE METHYLSULFATE 10 MG/10ML IV SOLN
INTRAVENOUS | Status: DC | PRN
  Administered 2014-12-19: 3 mg via INTRAVENOUS

## 2014-12-19 MED ORDER — SCOPOLAMINE 1 MG/3DAYS TD PT72
MEDICATED_PATCH | TRANSDERMAL | Status: AC
  Administered 2014-12-19: 1.5 mg
  Filled 2014-12-19: qty 1

## 2014-12-19 MED ORDER — DEXTROSE IN LACTATED RINGERS 5 % IV SOLN
INTRAVENOUS | Status: DC
  Administered 2014-12-19 – 2014-12-20 (×3): via INTRAVENOUS

## 2014-12-19 MED ORDER — LIDOCAINE HCL (CARDIAC) 20 MG/ML IV SOLN
INTRAVENOUS | Status: AC
  Filled 2014-12-19: qty 5

## 2014-12-19 MED ORDER — MIDAZOLAM HCL 2 MG/2ML IJ SOLN
INTRAMUSCULAR | Status: DC | PRN
  Administered 2014-12-19: 1 mg via INTRAVENOUS

## 2014-12-19 MED ORDER — KETOROLAC TROMETHAMINE 30 MG/ML IJ SOLN
INTRAMUSCULAR | Status: DC | PRN
  Administered 2014-12-19: 30 mg via INTRAVENOUS

## 2014-12-19 MED ORDER — FENTANYL CITRATE (PF) 100 MCG/2ML IJ SOLN
INTRAMUSCULAR | Status: DC | PRN
  Administered 2014-12-19 (×3): 50 ug via INTRAVENOUS
  Administered 2014-12-19 (×3): 25 ug via INTRAVENOUS
  Administered 2014-12-19: 50 ug via INTRAVENOUS
  Administered 2014-12-19: 25 ug via INTRAVENOUS
  Administered 2014-12-19 (×2): 50 ug via INTRAVENOUS

## 2014-12-19 MED ORDER — EPHEDRINE SULFATE 50 MG/ML IJ SOLN
INTRAMUSCULAR | Status: DC | PRN
  Administered 2014-12-19 (×2): 5 mg via INTRAVENOUS

## 2014-12-19 MED ORDER — ENOXAPARIN SODIUM 40 MG/0.4ML ~~LOC~~ SOLN
40.0000 mg | SUBCUTANEOUS | Status: DC
  Administered 2014-12-20: 40 mg via SUBCUTANEOUS
  Filled 2014-12-19 (×2): qty 0.4

## 2014-12-19 MED ORDER — GLYCOPYRROLATE 0.2 MG/ML IJ SOLN
INTRAMUSCULAR | Status: AC
  Filled 2014-12-19: qty 3

## 2014-12-19 MED ORDER — VASOPRESSIN 20 UNIT/ML IV SOLN
INTRAVENOUS | Status: DC | PRN
  Administered 2014-12-19: 9 mL via INTRAMUSCULAR

## 2014-12-19 MED ORDER — DEXAMETHASONE SODIUM PHOSPHATE 10 MG/ML IJ SOLN
INTRAMUSCULAR | Status: DC | PRN
  Administered 2014-12-19: 8 mg via INTRAVENOUS

## 2014-12-19 MED ORDER — NEBIVOLOL HCL 5 MG PO TABS
5.0000 mg | ORAL_TABLET | Freq: Every day | ORAL | Status: DC
  Administered 2014-12-20: 5 mg via ORAL
  Filled 2014-12-19 (×2): qty 1

## 2014-12-19 MED ORDER — VASOPRESSIN 20 UNIT/ML IV SOLN
INTRAVENOUS | Status: AC
  Filled 2014-12-19: qty 1

## 2014-12-19 MED ORDER — ONDANSETRON HCL 4 MG PO TABS: 4.0000 mg | ORAL_TABLET | Freq: Four times a day (QID) | ORAL | Status: DC | PRN

## 2014-12-19 MED ORDER — KETOROLAC TROMETHAMINE 30 MG/ML IJ SOLN
INTRAMUSCULAR | Status: AC
  Filled 2014-12-19: qty 1

## 2014-12-19 MED ORDER — PROPOFOL 10 MG/ML IV BOLUS
INTRAVENOUS | Status: DC | PRN
  Administered 2014-12-19: 60 mg via INTRAVENOUS
  Administered 2014-12-19: 150 mg via INTRAVENOUS
  Administered 2014-12-19: 30 mg via INTRAVENOUS

## 2014-12-19 MED ORDER — SODIUM CHLORIDE 0.9 % IJ SOLN: 9.0000 mL | INTRAMUSCULAR | Status: DC | PRN

## 2014-12-19 MED ORDER — METHYLENE BLUE 1 % INJ SOLN
INTRAMUSCULAR | Status: DC | PRN
  Administered 2014-12-19: 5 mg via INTRAVENOUS

## 2014-12-19 SURGICAL SUPPLY — 53 items
ADH SKN CLS APL DERMABOND .7 (GAUZE/BANDAGES/DRESSINGS) ×3
BLADE SURG 11 STRL SS (BLADE) ×4 IMPLANT
BLADE SURG 15 STRL LF C SS BP (BLADE) ×3 IMPLANT
BLADE SURG 15 STRL SS (BLADE) ×4
CANISTER SUCT 3000ML (MISCELLANEOUS) ×7 IMPLANT
CATH FOLEY 2WAY SLVR  5CC 18FR (CATHETERS) ×2
CATH FOLEY 2WAY SLVR 5CC 18FR (CATHETERS) ×6 IMPLANT
CLOTH BEACON ORANGE TIMEOUT ST (SAFETY) ×8 IMPLANT
CONT PATH 16OZ SNAP LID 3702 (MISCELLANEOUS) ×1 IMPLANT
DECANTER SPIKE VIAL GLASS SM (MISCELLANEOUS) ×1 IMPLANT
DERMABOND ADVANCED (GAUZE/BANDAGES/DRESSINGS) ×1
DERMABOND ADVANCED .7 DNX12 (GAUZE/BANDAGES/DRESSINGS) IMPLANT
DRAPE SHEET LG 3/4 BI-LAMINATE (DRAPES) ×8 IMPLANT
DRSG COVADERM PLUS 2X2 (GAUZE/BANDAGES/DRESSINGS) IMPLANT
GAUZE PACKING 2X5 YD STRL (GAUZE/BANDAGES/DRESSINGS) IMPLANT
GAUZE SPONGE 4X4 16PLY XRAY LF (GAUZE/BANDAGES/DRESSINGS) IMPLANT
GLOVE BIO SURGEON STRL SZ7.5 (GLOVE) ×4 IMPLANT
GLOVE BIOGEL PI IND STRL 6.5 (GLOVE) ×3 IMPLANT
GLOVE BIOGEL PI IND STRL 7.0 (GLOVE) ×6 IMPLANT
GLOVE BIOGEL PI IND STRL 7.5 (GLOVE) ×3 IMPLANT
GLOVE BIOGEL PI IND STRL 8.5 (GLOVE) ×3 IMPLANT
GLOVE BIOGEL PI INDICATOR 6.5 (GLOVE) ×1
GLOVE BIOGEL PI INDICATOR 7.0 (GLOVE) ×2
GLOVE BIOGEL PI INDICATOR 7.5 (GLOVE) ×1
GLOVE BIOGEL PI INDICATOR 8.5 (GLOVE) ×1
GLOVE ECLIPSE 8.0 STRL XLNG CF (GLOVE) ×8 IMPLANT
GOWN STRL REUS W/TWL LRG LVL3 (GOWN DISPOSABLE) ×24 IMPLANT
LIQUID BAND (GAUZE/BANDAGES/DRESSINGS) ×4 IMPLANT
NDL MAYO CATGUT SZ4 TPR NDL (NEEDLE) ×3 IMPLANT
NDL SPNL 22GX3.5 QUINCKE BK (NEEDLE) IMPLANT
NEEDLE HYPO 22GX1.5 SAFETY (NEEDLE) ×4 IMPLANT
NEEDLE MAYO CATGUT SZ4 (NEEDLE) ×4 IMPLANT
NEEDLE SPNL 22GX3.5 QUINCKE BK (NEEDLE) IMPLANT
NS IRRIG 1000ML POUR BTL (IV SOLUTION) ×7 IMPLANT
PACK VAGINAL WOMENS (CUSTOM PROCEDURE TRAY) ×8 IMPLANT
PAD OB MATERNITY 4.3X12.25 (PERSONAL CARE ITEMS) ×4 IMPLANT
SET CYSTO W/LG BORE CLAMP LF (SET/KITS/TRAYS/PACK) ×4 IMPLANT
SLING TRANS VAGINAL TAPE (Sling) ×1 IMPLANT
SLING UTERINE/ABD GYNECARE TVT (Sling) ×6 IMPLANT
SUT CHROMIC 2 0 TIES 18 (SUTURE) IMPLANT
SUT MNCRL AB 3-0 PS2 27 (SUTURE) IMPLANT
SUT MNCRL AB 4-0 PS2 18 (SUTURE) IMPLANT
SUT VIC AB 0 CT1 18XCR BRD8 (SUTURE) ×9 IMPLANT
SUT VIC AB 0 CT1 27 (SUTURE) ×8
SUT VIC AB 0 CT1 27XBRD ANBCTR (SUTURE) ×6 IMPLANT
SUT VIC AB 0 CT1 8-18 (SUTURE) ×8
SUT VIC AB 2-0 SH 27 (SUTURE) ×8
SUT VIC AB 2-0 SH 27XBRD (SUTURE) ×24 IMPLANT
SUT VICRYL 0 TIES 12 18 (SUTURE) ×4 IMPLANT
SYR TB 1ML 25GX5/8 (SYRINGE) ×3 IMPLANT
TOWEL OR 17X24 6PK STRL BLUE (TOWEL DISPOSABLE) ×14 IMPLANT
TRAY FOLEY CATH SILVER 14FR (SET/KITS/TRAYS/PACK) ×7 IMPLANT
WATER STERILE IRR 1000ML POUR (IV SOLUTION) ×7 IMPLANT

## 2014-12-19 NOTE — Anesthesia Procedure Notes (Addendum)
Procedure Name: Intubation Date/Time: 12/19/2014 1:21 PM Performed by: Georgeanne Nim Pre-anesthesia Checklist: Patient identified, Timeout performed, Emergency Drugs available, Suction available and Patient being monitored Patient Re-evaluated:Patient Re-evaluated prior to inductionOxygen Delivery Method: Circle system utilized Preoxygenation: Pre-oxygenation with 100% oxygen Intubation Type: IV induction Ventilation: Mask ventilation without difficulty and Oral airway inserted - appropriate to patient size Laryngoscope Size: Mac and 3 Grade View: Grade I Tube type: Oral Number of attempts: 1 Airway Equipment and Method: Stylet   Date/Time: 12/19/2014 1:21 PM Performed by: Georgeanne Nim Placement Confirmation: ETT inserted through vocal cords under direct vision,  positive ETCO2,  CO2 detector and breath sounds checked- equal and bilateral Secured at: 21 cm Tube secured with: Tape Dental Injury: Teeth and Oropharynx as per pre-operative assessment

## 2014-12-19 NOTE — Progress Notes (Signed)
Subjective:  Patient reports that she feels OK. Just got to her room. No pain, nausea, or vomiting.    Objective: I have reviewed patient's vital signs and intake and output.  General: alert and no distress Resp: clear to auscultation bilaterally Cardio: regular rate and rhythm, S1, S2 normal, no murmur, click, rub or gallop Extremities: extremities normal, atraumatic, no cyanosis or edema   Assessment/Plan:  Doing well day of Vag Hyst, TVT.  Plan to go home tomorrow.  Tyrion Glaude V 12/19/2014, 6:04 PM

## 2014-12-19 NOTE — Op Note (Signed)
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 Everett Graff  Surgeon:  Gildardo Cranker, M.D.  Assistant:  Earnstine Regal, PA-C  Anesthetic:  General  Disposition:  The patient presents with the above-mentioned diagnosis. The patient saw a GYN oncologist who agreed that the most appropriate treatment option for this patient is vaginal hysterectomy given her recurrent CIN-3 on her Pap smear and colposcopy and biopsies that could not confirm the abnormality. She understands the indications for her surgical procedure.  She also understands the alternative treatment options. She accepts the risk of, but not limited to, anesthetic complications, bleeding, infections, and possible damage to the surrounding organs.  Findings:  The patient was noted to have a small, 42 g uterus. The vaginal tissue was very thin and somewhat atrophic. The fallopian tubes and ovaries appeared normal. The patient was noted to have a cystocele with loss of the urethrovesical angle.  Procedure:  The patient was taken to the operating room where a general anesthetic was given. The patient's lower abdomen, perineum, and vagina were prepped with multiple layers of Betadine. A Foley catheter was placed in the bladder. The patient was sterilely draped. An examination under anesthesia was performed. Operative findings are mentioned above. The cervix was injected with half percent Marcaine with epinephrine. A circumferential incision was made around the cervix. The vaginal mucosa was  advanced anteriorly and posteriorly. The anterior cul-de-sac and in the posterior cul-de-sac were sharply entered. Alternating from right to left the uterosacral ligaments, paracervical tissues, parametrial tissues, and uterine arteries were clamped, cut, sutured, and tied securely. The upper pedicles were then clamped and cut. The uterus was removed from the operative field. The upper pedicles were secured using free ties and then suture ligatures. The left fallopian tube was identified. The mesosalpinx was clamped and cut removing the entire tube. Interrupted sutures were used for hemostasis. An identical procedure was carried out on the opposite side. Hemostasis was confirmed. The sutures attached to the uterosacral ligaments were brought out through the vaginal angles and then tied securely. A McCall culdoplasty suture was placed in the posterior cul-de-sac incorporating the uterosacral ligaments bilaterally and the posterior peritoneum. Half percent Marcaine with epinephrine was injected into all suture lines. A final check was made for hemostasis and again hemostasis was confirmed. The vaginal cuff was closed using figure-of-eight sutures incorporating the anterior vaginal mucosa, the anterior peritoneum, posterior peritoneum, and the posterior vaginal mucosa. The McCall culdoplasty suture was tied securely and the apex of the vagina was noted to elevate into the midpelvis. Half percent Marcaine was injected into the vaginal cuff. A total of 25 cc of half percent Marcaine with epinephrine was used during this procedure. The patient tolerated her procedure well. She was awakened from her anesthetic without difficulty and then transported to the recovery room in stable condition. Sponge, needle, and instrument counts were correct on 2 occasions. The estimated blood loss was 50 cc's. 0 Vicryl is the suture material used throughout the procedure. The uterus and tubes were sent to pathology.  Dr. Mancel Bale then  began her portion of the operative procedure which included a TVT and cystoscopy. She will dictate a separate note.  Gildardo Cranker, M.D.  12/19/2014

## 2014-12-19 NOTE — Op Note (Addendum)
Preop Diagnosis: SUI  Postop Diagnosis: SUI  Procedure:1.TVT 2. Cystoscopy  Fluids: 2500 cc  EBL: 50 cc  UOP: 50 cc  Complications:none  Procedure:The patient had been taken to the operating room after the risks, benefits and alternatives were discussed with patient, the patient verbalized understanding and consent signed and witnessed. The patient was found under general anesthesia and prepped and draped in the normal sterile fashion in the dorsal lithotomy position after TVH/BS was performed by Dr. Raphael Gibney.  A weighted speculum was placed in the patient's vagina and the anterior vaginal wall was injected with dilute pitressin at a concentration of 20 units of pitressin in a total of 50cc of normal saline.  An incision was made in the anterior wall of the vagina for approximately 1cm beneath the midurethra and the underlying tissue was dissected away from the anterior vaginal wall down to the level of the lower symphysis pubis bilaterally. Attention was then turned to the mons pubis where two 5 mm incisions were made 2 fingerbreadths from the midline. The transabdominal guide was then passed through the mons pubis incision on the patient's right down through the space of Retzius and out through the anterior vaginal wall after deflecting the rigid urethral catheter guide to the ipsilateral side. The same was done on the contralateral side. Cystoscopy was performed and no invadvertant bladder injury was noted. The bladder was drained with a Foley while deflecting the rigid urethral catheter guide to the patient's right and the mesh was attached to the transabdominal guide and elevated up through the space of Retzius and out through the incision on the mons pubis on the ipsilateral side. The same was done on the contralateral side. Cystoscopy was performed again and no inadvertant bladder injury was noted. The 75 French Foley was left in the urethra and a large Claiborne Billings was placed between the urethra and  the mesh in order to leave the mesh slack beneath the midurethra. The mesh was then cut flush with the skin at the mons pubis incisions bilaterally.  Methylene blue had been administered, cystoscopy was performed again and bilateral ureters were noted to efflux without difficulty. The bilateral incisions on the mons pubis were then cleaned and Dermabond applied. The anterior vaginal wall incision was repaired with 2-0 vicryl with interrupted stitches.  Vagina was packed with estrogen soaked vaginal packing.  Sponge, lap and needle count was correct.  The patient tolerated the procedure well and was returned to the recovery room in good condition.

## 2014-12-19 NOTE — Progress Notes (Signed)
I discussed pain management for this patient without pharmacy team. The patient reports that codeine products cause her to have nausea, vomiting, and hives. When I question the patient about this, she reports that codeine products causes her to have nausea and to feel "loopy". She does not like taking Percocet or Vicodin. She has taken Tylenol 3 in the past, but again it makes her feel dizzy. She had the same reaction when she took tramadol. She was told by prior physician not to take ibuprofen. She takes Naprosyn without difficulty. The pharmacy team and I believe that Toradol is appropriate for the patient. We also believe that PCA dilaudid should be appropriate for the patient while she is in the hospital. Outpatient management of pain may be a challenge. We will consider by mouth dilaudid and perhaps by mouth tramadol.  Dr. Raphael Gibney 12/19/2014 15:23 PM

## 2014-12-19 NOTE — Anesthesia Postprocedure Evaluation (Signed)
Anesthesia Post Note  Patient: April Cox  Procedure(s) Performed: Procedure(s) (LRB): TOTAL VAGINAL HYSTERECTOMY WITH BILATERAL SALPINGECTOMY (Bilateral) TRANSVAGINAL TAPE (TVT) PROCEDURE (N/A) CYSTO (N/A)  Patient location during evaluation: PACU Anesthesia Type: General Level of consciousness: awake and alert Pain management: pain level controlled Vital Signs Assessment: post-procedure vital signs reviewed and stable Respiratory status: spontaneous breathing, nonlabored ventilation, respiratory function stable and patient connected to nasal cannula oxygen Cardiovascular status: blood pressure returned to baseline and stable Postop Assessment: no signs of nausea or vomiting Anesthetic complications: no    Last Vitals:  Filed Vitals:   12/19/14 1615 12/19/14 1630  BP: 117/46 109/64  Pulse: 79 69  Temp:    Resp: 16 16    Last Pain: There were no vitals filed for this visit.               Carmina Walle, Liahna JENNETTE

## 2014-12-19 NOTE — H&P (Signed)
The patient was interviewed and examined today.  The previously documented history and physical examination was reviewed. There are no changes. The operative procedure was reviewed. The risks and benefits were outlined again. The specific risks include, but are not limited to, anesthetic complications, bleeding, infections, and possible damage to the surrounding organs. The patient's questions were answered.  We are ready to proceed as outlined. The likelihood of the patient achieving the goals of this procedure is very likely.   BP 155/73 mmHg  Pulse 66  Temp(Src) 97.9 F (36.6 C) (Oral)  Resp 20  Ht 5\' 6"  (1.676 m)  Wt 140 lb 6 oz (63.674 kg)  BMI 22.67 kg/m2  SpO2 100%  CBC    Component Value Date/Time   WBC 9.2 11/28/2014 0900   RBC 3.99 11/28/2014 0900   HGB 13.1 11/28/2014 0900   HCT 38.4 11/28/2014 0900   PLT 246 11/28/2014 0900   MCV 96.2 11/28/2014 0900   MCH 32.8 11/28/2014 0900   MCHC 34.1 11/28/2014 0900   RDW 13.1 11/28/2014 0900   LYMPHSABS 2.6 10/24/2014 0940   MONOABS 0.4 10/24/2014 0940   EOSABS 0.1 10/24/2014 0940   BASOSABS 0.0 10/24/2014 0940   Gildardo Cranker, M.D.

## 2014-12-19 NOTE — Anesthesia Preprocedure Evaluation (Addendum)
Anesthesia Evaluation  Patient identified by MRN, date of birth, ID band Patient awake    Reviewed: Allergy & Precautions, NPO status , Patient's Chart, lab work & pertinent test results  History of Anesthesia Complications (+) Family history of anesthesia reaction  Airway Mallampati: III  TM Distance: >3 FB Neck ROM: Full    Dental  (+) Upper Dentures   Pulmonary Current Smoker,    Pulmonary exam normal breath sounds clear to auscultation       Cardiovascular hypertension, Pt. on medications Normal cardiovascular exam+ Valvular Problems/Murmurs AS  Rhythm:Regular Rate:Normal + Systolic murmurs AVA [id number] 2012 Echo   Neuro/Psych negative psych ROS   GI/Hepatic Neg liver ROS, GERD  Medicated and Controlled,  Endo/Other  Hypothyroidism Hyperlipidemia  Renal/GU negative Renal ROS  negative genitourinary   Musculoskeletal  (+) Arthritis , Psoriatic Arthritis Osteopenia   Abdominal Normal abdominal exam  (+)   Peds  Hematology  (+) anemia ,   Anesthesia Other Findings   Reproductive/Obstetrics CIN III Urinary incontinence                           Anesthesia Physical Anesthesia Plan  ASA: II  Anesthesia Plan: General   Post-op Pain Management:    Induction: Intravenous  Airway Management Planned: Oral ETT  Additional Equipment:   Intra-op Plan:   Post-operative Plan: Extubation in OR  Informed Consent: I have reviewed the patients History and Physical, chart, labs and discussed the procedure including the risks, benefits and alternatives for the proposed anesthesia with the patient or authorized representative who has indicated his/her understanding and acceptance.   Dental advisory given  Plan Discussed with: CRNA, Anesthesiologist and Surgeon  Anesthesia Plan Comments: (Will insert A-line if BP unstable)       Anesthesia Quick Evaluation

## 2014-12-19 NOTE — Transfer of Care (Signed)
Immediate Anesthesia Transfer of Care Note  Patient: April Cox  Procedure(s) Performed: Procedure(s): TOTAL VAGINAL HYSTERECTOMY WITH BILATERAL SALPINGECTOMY (Bilateral) TRANSVAGINAL TAPE (TVT) PROCEDURE (N/A) CYSTO (N/A)  Patient Location: PACU  Anesthesia Type:General  Level of Consciousness: awake, alert  and oriented  Airway & Oxygen Therapy: Patient Spontanous Breathing  Post-op Assessment: Report given to RN and Post -op Vital signs reviewed and stable  Post vital signs: Reviewed and stable  Last Vitals:  Filed Vitals:   12/19/14 1615 12/19/14 1630  BP: 117/46 109/64  Pulse: 79 69  Temp:    Resp: 16 16    Complications: No apparent anesthesia complications

## 2014-12-20 ENCOUNTER — Encounter (HOSPITAL_COMMUNITY): Payer: Self-pay | Admitting: Obstetrics and Gynecology

## 2014-12-20 DIAGNOSIS — D069 Carcinoma in situ of cervix, unspecified: Secondary | ICD-10-CM | POA: Diagnosis not present

## 2014-12-20 DIAGNOSIS — D62 Acute posthemorrhagic anemia: Secondary | ICD-10-CM

## 2014-12-20 DIAGNOSIS — N8189 Other female genital prolapse: Secondary | ICD-10-CM

## 2014-12-20 LAB — CBC
HCT: 31.7 % — ABNORMAL LOW (ref 36.0–46.0)
HEMOGLOBIN: 11 g/dL — AB (ref 12.0–15.0)
MCH: 32.8 pg (ref 26.0–34.0)
MCHC: 34.7 g/dL (ref 30.0–36.0)
MCV: 94.6 fL (ref 78.0–100.0)
Platelets: 224 10*3/uL (ref 150–400)
RBC: 3.35 MIL/uL — AB (ref 3.87–5.11)
RDW: 13.3 % (ref 11.5–15.5)
WBC: 12.3 10*3/uL — ABNORMAL HIGH (ref 4.0–10.5)

## 2014-12-20 MED ORDER — CIPROFLOXACIN HCL 250 MG PO TABS: 250.0000 mg | ORAL_TABLET | Freq: Two times a day (BID) | ORAL | Status: DC

## 2014-12-20 MED ORDER — NAPROXEN SODIUM 550 MG PO TABS: 550.0000 mg | ORAL_TABLET | Freq: Two times a day (BID) | ORAL | Status: DC

## 2014-12-20 MED ORDER — MEPERIDINE HCL 50 MG PO TABS: 50.0000 mg | ORAL_TABLET | ORAL | Status: DC | PRN

## 2014-12-20 NOTE — Discharge Instructions (Signed)
Call [facility] @ [phone] if:  You have a temperature greater than or equal to 100.4 degrees Farenheit orally You have pain that is not made better by the pain medication given and taken as directed You have excessive bleeding or problems urinating  Take Colace (Docusate Sodium/Stool Softener) 100 mg 2-3 times daily while taking narcotic pain medicine to avoid constipation or until bowel movements are regular.  If no bowel movement 3 days after resuming a regular diet, take a laxative  of your choice.   You may drive after 2 weeks You may walk up steps  Abdominal Hysterectomy, Care After  Refer to this sheet in the next few weeks. These instructions provide you with information on caring for yourself after your procedure. Your health care provider may also give you more specific instructions. Your treatment has been planned according to current medical practices, but problems sometimes occur. Call your health care provider if you have any problems or questions after your procedure.  WHAT TO EXPECT AFTER THE PROCEDURE After your procedure, it is typical to have the following:  Pain.  Feeling tired.  Poor appetite.  Less interest in sex. It takes 4-6 weeks to recover from this surgery.  HOME CARE INSTRUCTIONS   Take pain medicines only as directed by your health care provider. Do not take over-the-counter pain medicines without checking with your health care provider first.  Change your bandage as directed by your health care provider.  Return to your health care provider to have your sutures taken out.  Take showers instead of baths for 2-3 weeks. Ask your health care provider when it is safe to start showering.  Do not douche, use tampons, or have sexual intercourse for at least 6 weeks or until your health care provider says you can.   Follow your health care provider's advice about exercise, lifting, driving, and general activities.  Get plenty of rest  and sleep.   Do not lift anything heavier than a gallon of milk (about 10 lb [4.5 kg]) for the first month after surgery.  You can resume your normal diet if your health care provider says it is okay.   Do not drink alcohol until your health care provider says you can.   If you are constipated, ask your health care provider if you can take a mild laxative.  Eating foods high in fiber may also help with constipation. Eat plenty of raw fruits and vegetables, whole grains, and beans.  Drink enough fluids to keep your urine clear or pale yellow.   Try to have someone at home with you for the first 1-2 weeks to help around the house.  Keep all follow-up appointments. SEEK MEDICAL CARE IF:   You have chills or fever.  You have swelling, redness, or pain in the area of your incision that is getting worse.   You have pus coming from the incision.   You notice a bad smell coming from the incision or bandage.   Your incision breaks open.   You feel dizzy or light-headed.   You have pain or bleeding when you urinate.   You have persistent diarrhea.   You have persistent nausea and vomiting.   You have abnormal vaginal discharge.   You have a rash.   You have any type of abnormal reaction or develop an allergy to your medicine.   Your pain medicine is not helping.  SEEK IMMEDIATE MEDICAL CARE IF:   You have a fever and your  symptoms suddenly get worse.  You have severe abdominal pain.  You have chest pain.  You have shortness of breath.  You faint.  You have pain, swelling, or redness of your leg.  You have heavy vaginal bleeding with blood clots. MAKE SURE YOU:  Understand these instructions.  Will watch your condition.  Will get help right away if you are not doing well or get worse.   This information is not intended to replace advice given to you by your health care provider. Make sure you discuss any questions you have with your health care  provider.   Document Released: 07/25/2004 Document Revised: 01/26/2014 Document Reviewed: 10/28/2012 Elsevier Interactive Patient Education 2016 [place] may shower  You may resume a regular diet  Keep incisions clean and dry Do not lift over 15 pounds for 6 weeks Avoid anything in vagina for 6 weeks (or until after your post-operative visit)   Keep post operative visit with Dr. Raphael Gibney on January 22, 2015 at 10:45 a.m. and Dr. Mancel Bale that same day at 11:15 a.m.

## 2014-12-20 NOTE — Discharge Summary (Signed)
Physician Discharge Summary  Patient ID: April Cox MRN: [id number] DOB/AGE: March 27, 1955 59 y.o.  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 She has a history of  CIN III;  Urinary Incontinence . The patient was seen by a GYN oncologist. Because we are having difficulty explaining why the patient continues to have CIN-3 on her Pap smears, the recommendation was to proceed with vaginal hysterectomy. Cystometric's showed mixed urinary incontinence with predominant stress component. Please see her documented history and physical exam.  Hospital course:  On the day of admission, the patient underwent the following Procedure(s): TOTAL VAGINAL HYSTERECTOMY WITH BILATERAL SALPINGECTOMY TRANSVAGINAL TAPE (TVT) PROCEDURE CYSTO. Operative findings included a 45 g uterus with normal fallopian tubes and ovaries. No parametrial disease was found. The patient was noted to have a cystocele with loss of the urethrovesical angle. Her postoperative course was uneventful. She quickly tolerated a regular diet. Her postoperative pain was controlled with oral medication. She remained afebrile. The patient was able to void adequate amounts of urine. Her post void residual was acceptable. Today she was felt to be ready for discharge.  Today's exam:  The patient reports that she is tolerating a regular diet. Her vaginal pack and her Foley  catheter have been removed. She has voided small amounts. She has minimal pain. She has passed flatus.  BP 141/62 mmHg  Pulse 56  Temp(Src) 98.5 F (36.9 C) (Oral)  Resp 14  Ht 5\' 6"  (1.676 m)  Wt 140 lb 6 oz (63.674 kg)  BMI 22.67 kg/m2  SpO2 98%  Chest: Clear Heart: Regular rate and rhythm Abdomen: Soft, nontender, bowel sounds positive, incisions are healing well Extremities: No masses or tenderness Minimal vaginal spotting  Labs:  HEMOGLOBIN  Date Value Ref Range Status  12/20/2014 11.0* 12.0 - 15.0 g/dL Final   HCT  Date Value Ref Range Status  12/20/2014 31.7* 36.0 - 46.0 % Final    Consults: None  Final pathology report: Pending at the time of discharge  Disposition:  The patient will be discharged to home. She has been given a copy of the discharge instructions as prepared by the [facility] for patients who have undergone the Procedure(s): TOTAL VAGINAL HYSTERECTOMY WITH BILATERAL SALPINGECTOMY TRANSVAGINAL TAPE (TVT) PROCEDURE CYSTO.      Medication List    TAKE these medications        atorvastatin 40 MG tablet  Commonly known as:  LIPITOR  Take 1 tablet (40 mg total) by mouth every morning.     calcium-vitamin D 500-200 MG-UNIT tablet  Commonly known as:  OSCAL WITH D  Take 1 tablet by mouth 2 (two) times daily.     ciprofloxacin 250 MG tablet  Commonly known as:  CIPRO  Take 1 tablet (250 mg total) by mouth 2 (two) times daily.     clobetasol ointment 0.05 %  Commonly known as:  TEMOVATE  Apply 1 application topically as needed (psiorisis).  EPINEPHrine 0.3 mg/0.3 mL Soaj injection  Commonly known as:  EPI-PEN  Inject 0.3 mLs (0.3 mg total) into the muscle once.     fish oil-omega-3 fatty acids 1000 MG capsule  Take 1 g by mouth daily.     folic acid 1 MG tablet  Commonly known as:  FOLVITE  Take 2 mg by mouth daily.     levothyroxine 25 MCG tablet  Commonly known as:  SYNTHROID  Take 1 tablet (25 mcg  total) by mouth daily before breakfast.     MAGNESIUM PO  Take 20 mg by mouth 2 (two) times daily.     meperidine 50 MG tablet  Commonly known as:  DEMEROL  Take 1 tablet (50 mg total) by mouth every 4 (four) hours as needed for severe pain.     METHOTREXATE (PF)   Inject 0.8 mg into the skin once a week. Saturday's     naproxen sodium 220 MG tablet  Commonly known as:  ANAPROX  Take 220 mg by mouth 2 (two) times daily as needed (dental pain).     naproxen sodium 550 MG tablet  Commonly known as:  ANAPROX  Take 1 tablet (550 mg total) by mouth 2 (two) times daily with a meal.     nebivolol 5 MG tablet  Commonly known as:  BYSTOLIC  TAKE 1 TABLET (5 MG TOTAL) BY MOUTH DAILY.     omeprazole 40 MG capsule  Commonly known as:  PRILOSEC  Take 1 capsule (40 mg total) by mouth daily.     OVER THE COUNTER MEDICATION  Take 1,000 mg by mouth daily. Magnesium maleate     REMICADE 100 MG injection  Generic drug:  inFLIXimab  Inject 100 mg into the vein every 6 (six) weeks. Last inj. 10/08     vitamin C 250 MG tablet  Commonly known as:  ASCORBIC ACID  Take 250 mg by mouth daily.     VITAMIN D PO  Take 500 tablets by mouth daily.           Follow-up Information    Follow up with Eli Hose, MD On 01/22/2015.   Specialty:  Obstetrics and Gynecology   Why:  Appointment time is 10:45 a.m.   Contact information:   [address] [place] [address] [phone]       Follow up with Delice Lesch, MD On 01/22/2015.   Specialty:  Obstetrics and Gynecology   Why:  Appointment time is 11:15 a.m.   Contact information:   [address] [place] [address] [phone]       Follow up with Eli Hose, MD In 6 weeks.   Specialty:  Obstetrics and Gynecology   Contact information:   [address] [place] [address] [phone]       Signed: Eli Hose 12/20/2014, 9:52 AM

## 2014-12-20 NOTE — Progress Notes (Signed)
Pt is discharged in the care of husband. Downstairs per ambulatory with R.N. Judd Lien. Denies any pain or discomfort. Stable. Spirits are good.  Denies heavy vaginal bleeding.Stable.  No equipment needed for home use.

## 2014-12-20 NOTE — Progress Notes (Signed)
Vaginal packing removed as ordered, small amount blood-tinged drainage noted on packing. Patient tolerated removal of packing fair.  Foley cath discontinued as ordered, tolerated well.

## 2014-12-20 NOTE — Anesthesia Postprocedure Evaluation (Signed)
Anesthesia Post Note  Patient: April Cox  Procedure(s) Performed: Procedure(s) (LRB): TOTAL VAGINAL HYSTERECTOMY WITH BILATERAL SALPINGECTOMY (Bilateral) TRANSVAGINAL TAPE (TVT) PROCEDURE (N/A) CYSTO (N/A)  Patient location during evaluation: [facility] Anesthesia Type: General Level of consciousness: awake and alert and oriented Pain management: pain level controlled Vital Signs Assessment: post-procedure vital signs reviewed and stable Respiratory status: spontaneous breathing and nonlabored ventilation Cardiovascular status: stable Postop Assessment: no signs of nausea or vomiting and adequate PO intake Anesthetic complications: no    Last Vitals:  Filed Vitals:   12/20/14 0203 12/20/14 0533  BP: 119/52 133/62  Pulse: 57 70  Temp: 36.4 C 36.7 C  Resp: 14 14    Last Pain:  Filed Vitals:   12/20/14 0615  PainSc: 0-No pain                 Alexxander Kurt

## 2015-01-03 ENCOUNTER — Other Ambulatory Visit (HOSPITAL_COMMUNITY): Payer: Self-pay | Admitting: *Deleted

## 2015-01-04 ENCOUNTER — Ambulatory Visit (HOSPITAL_COMMUNITY)
Admission: RE | Admit: 2015-01-04 | Discharge: 2015-01-04 | Disposition: A | Discharge status: Home / Self Care | Payer: BLUE CROSS/BLUE SHIELD | Source: Ambulatory Visit | Attending: Rheumatology | Admitting: Rheumatology

## 2015-01-04 DIAGNOSIS — L4050 Arthropathic psoriasis, unspecified: Secondary | ICD-10-CM | POA: Insufficient documentation

## 2015-01-04 LAB — COMPREHENSIVE METABOLIC PANEL
ALBUMIN: 3.2 g/dL — AB (ref 3.5–5.0)
ALK PHOS: 95 U/L (ref 38–126)
ALT: 15 U/L (ref 14–54)
ANION GAP: 8 (ref 5–15)
AST: 31 U/L (ref 15–41)
BILIRUBIN TOTAL: 2.1 mg/dL — AB (ref 0.3–1.2)
BUN: 8 mg/dL (ref 6–20)
CALCIUM: 9 mg/dL (ref 8.9–10.3)
CO2: 24 mmol/L (ref 22–32)
CREATININE: 0.73 mg/dL (ref 0.44–1.00)
Chloride: 104 mmol/L (ref 101–111)
Glucose, Bld: 123 mg/dL — ABNORMAL HIGH (ref 65–99)
Potassium: 4.9 mmol/L (ref 3.5–5.1)
SODIUM: 136 mmol/L (ref 135–145)
TOTAL PROTEIN: 6.8 g/dL (ref 6.5–8.1)

## 2015-01-04 LAB — CBC WITH DIFFERENTIAL/PLATELET
Basophils Absolute: 0 10*3/uL (ref 0.0–0.1)
Basophils Relative: 0 %
EOS ABS: 0 10*3/uL (ref 0.0–0.7)
Eosinophils Relative: 0 %
HCT: 37.9 % (ref 36.0–46.0)
HEMOGLOBIN: 13 g/dL (ref 12.0–15.0)
LYMPHS ABS: 2 10*3/uL (ref 0.7–4.0)
Lymphocytes Relative: 19 %
MCH: 32.7 pg (ref 26.0–34.0)
MCHC: 34.3 g/dL (ref 30.0–36.0)
MCV: 95.2 fL (ref 78.0–100.0)
Monocytes Absolute: 0.4 10*3/uL (ref 0.1–1.0)
Monocytes Relative: 4 %
NEUTROS ABS: 8 10*3/uL — AB (ref 1.7–7.7)
NEUTROS PCT: 77 %
Platelets: 356 10*3/uL (ref 150–400)
RBC: 3.98 MIL/uL (ref 3.87–5.11)
RDW: 12.7 % (ref 11.5–15.5)
WBC: 10.5 10*3/uL (ref 4.0–10.5)

## 2015-01-04 MED ORDER — SODIUM CHLORIDE 0.9 % IV SOLN
300.0000 mg | INTRAVENOUS | Status: DC
  Administered 2015-01-04: 300 mg via INTRAVENOUS
  Filled 2015-01-04: qty 30

## 2015-01-04 MED ORDER — SODIUM CHLORIDE 0.9 % IV SOLN
INTRAVENOUS | Status: DC
  Administered 2015-01-04: 09:00:00 via INTRAVENOUS

## 2015-01-04 MED ORDER — DIPHENHYDRAMINE HCL 25 MG PO TABS: 25.0000 mg | ORAL_TABLET | ORAL | Status: DC

## 2015-01-04 MED ORDER — ACETAMINOPHEN 325 MG PO TABS: 650.0000 mg | ORAL_TABLET | ORAL | Status: DC

## 2015-02-14 ENCOUNTER — Other Ambulatory Visit (HOSPITAL_COMMUNITY): Payer: Self-pay

## 2015-02-15 ENCOUNTER — Ambulatory Visit (HOSPITAL_COMMUNITY)
Admission: RE | Admit: 2015-02-15 | Discharge: 2015-02-15 | Disposition: A | Discharge status: Home / Self Care | Payer: BLUE CROSS/BLUE SHIELD | Source: Ambulatory Visit | Attending: Rheumatology | Admitting: Rheumatology

## 2015-02-15 DIAGNOSIS — L4050 Arthropathic psoriasis, unspecified: Secondary | ICD-10-CM | POA: Diagnosis not present

## 2015-02-15 LAB — COMPREHENSIVE METABOLIC PANEL
ALBUMIN: 3.7 g/dL (ref 3.5–5.0)
ALT: 19 U/L (ref 14–54)
AST: 22 U/L (ref 15–41)
Alkaline Phosphatase: 81 U/L (ref 38–126)
Anion gap: 10 (ref 5–15)
BILIRUBIN TOTAL: 0.8 mg/dL (ref 0.3–1.2)
BUN: 7 mg/dL (ref 6–20)
CHLORIDE: 105 mmol/L (ref 101–111)
CO2: 26 mmol/L (ref 22–32)
CREATININE: 0.63 mg/dL (ref 0.44–1.00)
Calcium: 9.8 mg/dL (ref 8.9–10.3)
GFR calc Af Amer: >60 mL/min (ref >60)
Glucose, Bld: 89 mg/dL (ref 65–99)
Potassium: 4 mmol/L (ref 3.5–5.1)
SODIUM: 141 mmol/L (ref 135–145)
Total Protein: 7.6 g/dL (ref 6.5–8.1)

## 2015-02-15 LAB — CBC WITH DIFFERENTIAL/PLATELET
BASOS PCT: 0 %
Basophils Absolute: 0 10*3/uL (ref 0.0–0.1)
EOS ABS: 0.1 10*3/uL (ref 0.0–0.7)
EOS PCT: 2 %
HCT: 39.2 % (ref 36.0–46.0)
Hemoglobin: 13 g/dL (ref 12.0–15.0)
LYMPHS ABS: 2.2 10*3/uL (ref 0.7–4.0)
Lymphocytes Relative: 29 %
MCH: 31.6 pg (ref 26.0–34.0)
MCHC: 33.2 g/dL (ref 30.0–36.0)
MCV: 95.4 fL (ref 78.0–100.0)
MONO ABS: 0.3 10*3/uL (ref 0.1–1.0)
MONOS PCT: 4 %
Neutro Abs: 4.8 10*3/uL (ref 1.7–7.7)
Neutrophils Relative %: 65 %
Platelets: 318 10*3/uL (ref 150–400)
RBC: 4.11 MIL/uL (ref 3.87–5.11)
RDW: 12.7 % (ref 11.5–15.5)
WBC: 7.4 10*3/uL (ref 4.0–10.5)

## 2015-02-15 MED ORDER — DIPHENHYDRAMINE HCL 25 MG PO TABS: 25.0000 mg | ORAL_TABLET | ORAL | Status: DC

## 2015-02-15 MED ORDER — SODIUM CHLORIDE 0.9 % IV SOLN
300.0000 mg | INTRAVENOUS | Status: DC
  Administered 2015-02-15: 300 mg via INTRAVENOUS
  Filled 2015-02-15: qty 30

## 2015-02-15 MED ORDER — SODIUM CHLORIDE 0.9 % IV SOLN
INTRAVENOUS | Status: DC
  Administered 2015-02-15: 09:00:00 via INTRAVENOUS

## 2015-02-15 MED ORDER — ACETAMINOPHEN 325 MG PO TABS: 650.0000 mg | ORAL_TABLET | ORAL | Status: DC

## 2015-02-15 NOTE — Discharge Instructions (Signed)
Excuse from Work, [employer], or Physical Activity ___________[name]_______________________________________ needs to be excused from: __X___ Work _____ [employer] _____ Physical activity beginning now and through the following date: __1/27/2017__________________. __X___ He or she may return to work or school but should still avoid the following physical activity or activities from now until ________1/27/2017____________. Activity restrictions include: _____ Lifting more than _______ lb _____ Sitting longer than __________ minutes at a time _____ Standing longer than ________ minutes at a time __x___ He or she may return to full physical activity as of 1/27/2017____________________. Health Care Provider Name (printed): __kristin Gillian Scarce RN _____________________________________  [facility] Provider (signature): ___________________________________________ Date: __1/27/2017______________   This information is not intended to replace advice given to you by your health care provider. Make sure you discuss any questions you have with your health care provider.   Document Released: 07/01/2000 Document Revised: 01/26/2014 Document Reviewed: 08/07/2013 Elsevier Interactive Patient Education [employer].

## 2015-03-28 ENCOUNTER — Other Ambulatory Visit (HOSPITAL_COMMUNITY): Payer: Self-pay

## 2015-03-29 ENCOUNTER — Ambulatory Visit (HOSPITAL_COMMUNITY)
Admission: RE | Admit: 2015-03-29 | Discharge: 2015-03-29 | Disposition: A | Discharge status: Home / Self Care | Payer: BLUE CROSS/BLUE SHIELD | Source: Ambulatory Visit | Attending: Rheumatology | Admitting: Rheumatology

## 2015-03-29 DIAGNOSIS — L4050 Arthropathic psoriasis, unspecified: Secondary | ICD-10-CM | POA: Insufficient documentation

## 2015-03-29 DIAGNOSIS — Z79899 Other long term (current) drug therapy: Secondary | ICD-10-CM | POA: Diagnosis not present

## 2015-03-29 LAB — CBC WITH DIFFERENTIAL/PLATELET
Basophils Absolute: 0 10*3/uL (ref 0.0–0.1)
Basophils Relative: 0 %
EOS PCT: 1 %
Eosinophils Absolute: 0.1 10*3/uL (ref 0.0–0.7)
HEMATOCRIT: 40.2 % (ref 36.0–46.0)
HEMOGLOBIN: 13.3 g/dL (ref 12.0–15.0)
LYMPHS ABS: 4 10*3/uL (ref 0.7–4.0)
LYMPHS PCT: 44 %
MCH: 31.1 pg (ref 26.0–34.0)
MCHC: 33.1 g/dL (ref 30.0–36.0)
MCV: 94.1 fL (ref 78.0–100.0)
Monocytes Absolute: 0.4 10*3/uL (ref 0.1–1.0)
Monocytes Relative: 4 %
NEUTROS ABS: 4.7 10*3/uL (ref 1.7–7.7)
Neutrophils Relative %: 51 %
PLATELETS: 318 10*3/uL (ref 150–400)
RBC: 4.27 MIL/uL (ref 3.87–5.11)
RDW: 13.6 % (ref 11.5–15.5)
WBC: 9.1 10*3/uL (ref 4.0–10.5)

## 2015-03-29 LAB — COMPREHENSIVE METABOLIC PANEL
ALK PHOS: 75 U/L (ref 38–126)
ALT: 17 U/L (ref 14–54)
AST: 18 U/L (ref 15–41)
Albumin: 3.8 g/dL (ref 3.5–5.0)
Anion gap: 13 (ref 5–15)
BILIRUBIN TOTAL: 0.3 mg/dL (ref 0.3–1.2)
CALCIUM: 9.5 mg/dL (ref 8.9–10.3)
CO2: 24 mmol/L (ref 22–32)
CREATININE: 0.59 mg/dL (ref 0.44–1.00)
Chloride: 108 mmol/L (ref 101–111)
Glucose, Bld: 92 mg/dL (ref 65–99)
Potassium: 3.6 mmol/L (ref 3.5–5.1)
Sodium: 145 mmol/L (ref 135–145)
TOTAL PROTEIN: 7.3 g/dL (ref 6.5–8.1)

## 2015-03-29 MED ORDER — DIPHENHYDRAMINE HCL 25 MG PO CAPS: 25.0000 mg | ORAL_CAPSULE | Freq: Once | ORAL | Status: DC

## 2015-03-29 MED ORDER — SODIUM CHLORIDE 0.9 % IV SOLN
INTRAVENOUS | Status: DC
  Administered 2015-03-29: 10:00:00 via INTRAVENOUS

## 2015-03-29 MED ORDER — ACETAMINOPHEN 325 MG PO TABS: 650.0000 mg | ORAL_TABLET | Freq: Once | ORAL | Status: DC

## 2015-03-29 MED ORDER — SODIUM CHLORIDE 0.9 % IV SOLN
5.0000 mg/kg | INTRAVENOUS | Status: DC
  Administered 2015-03-29: 300 mg via INTRAVENOUS
  Filled 2015-03-29: qty 30

## 2015-03-29 NOTE — Discharge Instructions (Signed)
Return to Work  April Cox was treated at our facility.  INJURY OR ILLNESS WAS: _____ Work related ____ Not work related _____ Undetermined if work related  RETURN TO WORK  Employee may return to work on Friday 03/29/2015.  Employee may return to modified work on ____________________.   WORK ACTIVITY RESTRICTIONS Work activities that are not tolerated include: _____ Bending _____ Prolonged sitting _____ Lifting more than ____________________ lb _____ Squatting _____ Prolonged standing _____ Climbing _____ Reaching _____ Pushing and pulling _____ Walking _____ Other ____________________ These restrictions are effective until ____________________.  Show this Return to Work statement to your supervisor at work as soon as possible. Your employer should be aware of your condition and can help with the necessary work activity restrictions. If you wish to return to work sooner than the date that is listed above, or if you have further problems that make it difficult for you to return at that time, please call our clinic or your health care provider. _________________________________________ Health Care Provider Name (printed) _________________________________________ Health Care Provider (signature)  _________________________________________ Date   This information is not intended to replace advice given to you by your health care provider. Make sure you discuss any questions you have with your health care provider.   Document Released: 01/05/2005 Document Revised: 01/26/2014 Document Reviewed: 08/04/2013 Elsevier Interactive Patient Education [employer].

## 2015-04-06 ENCOUNTER — Other Ambulatory Visit: Payer: Self-pay | Admitting: Family Medicine

## 2015-04-08 NOTE — Telephone Encounter (Signed)
Is this ok to refill?  

## 2015-05-08 DIAGNOSIS — L408 Other psoriasis: Secondary | ICD-10-CM | POA: Diagnosis not present

## 2015-05-08 DIAGNOSIS — M79642 Pain in left hand: Secondary | ICD-10-CM | POA: Diagnosis not present

## 2015-05-08 DIAGNOSIS — M79672 Pain in left foot: Secondary | ICD-10-CM | POA: Diagnosis not present

## 2015-05-08 DIAGNOSIS — M79671 Pain in right foot: Secondary | ICD-10-CM | POA: Diagnosis not present

## 2015-05-08 DIAGNOSIS — Z09 Encounter for follow-up examination after completed treatment for conditions other than malignant neoplasm: Secondary | ICD-10-CM | POA: Diagnosis not present

## 2015-05-08 DIAGNOSIS — M79641 Pain in right hand: Secondary | ICD-10-CM | POA: Diagnosis not present

## 2015-05-09 DIAGNOSIS — S92414A Nondisplaced fracture of proximal phalanx of right great toe, initial encounter for closed fracture: Secondary | ICD-10-CM | POA: Diagnosis not present

## 2015-05-10 ENCOUNTER — Ambulatory Visit (HOSPITAL_COMMUNITY)
Admission: RE | Admit: 2015-05-10 | Discharge: 2015-05-10 | Disposition: A | Discharge status: Home / Self Care | Payer: BLUE CROSS/BLUE SHIELD | Source: Ambulatory Visit | Attending: Rheumatology | Admitting: Rheumatology

## 2015-05-10 DIAGNOSIS — L4050 Arthropathic psoriasis, unspecified: Secondary | ICD-10-CM | POA: Diagnosis not present

## 2015-05-10 LAB — COMPREHENSIVE METABOLIC PANEL
ALK PHOS: 77 U/L (ref 38–126)
ALT: 16 U/L (ref 14–54)
ANION GAP: 9 (ref 5–15)
AST: 24 U/L (ref 15–41)
Albumin: 3.7 g/dL (ref 3.5–5.0)
BILIRUBIN TOTAL: 0.8 mg/dL (ref 0.3–1.2)
BUN: 5 mg/dL — ABNORMAL LOW (ref 6–20)
CALCIUM: 9.8 mg/dL (ref 8.9–10.3)
CO2: 23 mmol/L (ref 22–32)
CREATININE: 0.68 mg/dL (ref 0.44–1.00)
Chloride: 105 mmol/L (ref 101–111)
GFR calc non Af Amer: >60 mL/min (ref >60)
Glucose, Bld: 110 mg/dL — ABNORMAL HIGH (ref 65–99)
Potassium: 4.1 mmol/L (ref 3.5–5.1)
Sodium: 137 mmol/L (ref 135–145)
TOTAL PROTEIN: 7.7 g/dL (ref 6.5–8.1)

## 2015-05-10 LAB — CBC WITH DIFFERENTIAL/PLATELET
Basophils Absolute: 0 10*3/uL (ref 0.0–0.1)
Basophils Relative: 0 %
Eosinophils Absolute: 0 10*3/uL (ref 0.0–0.7)
Eosinophils Relative: 0 %
HEMATOCRIT: 39.6 % (ref 36.0–46.0)
HEMOGLOBIN: 13.3 g/dL (ref 12.0–15.0)
LYMPHS ABS: 1.6 10*3/uL (ref 0.7–4.0)
LYMPHS PCT: 24 %
MCH: 31.5 pg (ref 26.0–34.0)
MCHC: 33.6 g/dL (ref 30.0–36.0)
MCV: 93.8 fL (ref 78.0–100.0)
MONOS PCT: 5 %
Monocytes Absolute: 0.3 10*3/uL (ref 0.1–1.0)
NEUTROS ABS: 4.8 10*3/uL (ref 1.7–7.7)
NEUTROS PCT: 71 %
Platelets: 251 10*3/uL (ref 150–400)
RBC: 4.22 MIL/uL (ref 3.87–5.11)
RDW: 14.2 % (ref 11.5–15.5)
WBC: 6.7 10*3/uL (ref 4.0–10.5)

## 2015-05-10 MED ORDER — ACETAMINOPHEN 325 MG PO TABS: 650.0000 mg | ORAL_TABLET | Freq: Once | ORAL | Status: DC

## 2015-05-10 MED ORDER — SODIUM CHLORIDE 0.9 % IV SOLN
Freq: Once | INTRAVENOUS | Status: AC
  Administered 2015-05-10: 09:00:00 via INTRAVENOUS

## 2015-05-10 MED ORDER — SODIUM CHLORIDE 0.9 % IV SOLN
300.0000 mg | Freq: Once | INTRAVENOUS | Status: AC
  Administered 2015-05-10: 300 mg via INTRAVENOUS
  Filled 2015-05-10: qty 30

## 2015-05-10 MED ORDER — DIPHENHYDRAMINE HCL 25 MG PO CAPS
25.0000 mg | ORAL_CAPSULE | Freq: Once | ORAL | Status: DC
  Filled 2015-05-10: qty 1

## 2015-05-10 NOTE — Discharge Instructions (Signed)
Excuse from Work, [employer], or Physical Activity Pragya Birkes needs to be excused from:  __X___ Work _____ [employer] _____ Physical activity beginning now and through the following date: ____________________. _____ He or she may return to work or school but should still avoid the following physical activity or activities from now until ____________________. Activity restrictions include: _____ Lifting more than _______ lb _____ Sitting longer than __________ minutes at a time _____ Standing longer than ________ minutes at a time __X___ He or she may return to full physical activity as of __Friday 4/21/17___.  Health Care Provider Name (printed):Danyel Griess Arline Asp, [facility] Provider (signature): ___________________________________________ Date: __4/21/17___   This information is not intended to replace advice given to you by your health care provider. Make sure you discuss any questions you have with your health care provider.   Document Released: 07/01/2000 Document Revised: 01/26/2014 Document Reviewed: 08/07/2013 Elsevier Interactive Patient Education [employer].

## 2015-05-12 ENCOUNTER — Telehealth: Payer: Self-pay | Admitting: Family Medicine

## 2015-05-15 NOTE — Telephone Encounter (Signed)
Call in Toprol 50 mg 1 daily #30 . Recheck appointment in 30 days

## 2015-05-15 NOTE — Telephone Encounter (Signed)
P.A. Bystolic denied.  Pt needs trial of 2 covered alternatives, Metoprolol, Atenolol or Carvedilol.  Spoke with pt and she hasn't tried these and she spoke to her pharmacy and they discussed & recommend Metoprolol once daily because she doesn't think she would remember to take twice a day.   Do you want to switch ?

## 2015-05-16 ENCOUNTER — Other Ambulatory Visit: Payer: Self-pay

## 2015-05-16 MED ORDER — METOPROLOL SUCCINATE ER 50 MG PO TB24: 50.0000 mg | ORAL_TABLET | Freq: Every day | ORAL | Status: DC

## 2015-05-16 NOTE — Telephone Encounter (Signed)
i have sent in 50 mg toprol per [employer]

## 2015-05-16 NOTE — Telephone Encounter (Signed)
i called pt left message for pt on machine that I have called in toprol 50 mg that she will take once a day that she will need to make appointment for follow up in 30 days

## 2015-05-20 NOTE — Telephone Encounter (Signed)
Medication switched 

## 2015-05-28 ENCOUNTER — Other Ambulatory Visit: Payer: Self-pay

## 2015-05-28 ENCOUNTER — Telehealth: Payer: Self-pay | Admitting: Family Medicine

## 2015-05-28 NOTE — Telephone Encounter (Signed)
Rcvd refill request for Pantoprazole 40 MG #90

## 2015-05-28 NOTE — Telephone Encounter (Signed)
This medicine is not in her chart patient has been left messages through her husband and with her she needs an appointment

## 2015-05-29 DIAGNOSIS — M25521 Pain in right elbow: Secondary | ICD-10-CM | POA: Diagnosis not present

## 2015-05-29 DIAGNOSIS — M79641 Pain in right hand: Secondary | ICD-10-CM | POA: Diagnosis not present

## 2015-05-29 DIAGNOSIS — M79642 Pain in left hand: Secondary | ICD-10-CM | POA: Diagnosis not present

## 2015-06-13 ENCOUNTER — Other Ambulatory Visit: Payer: Self-pay | Admitting: Family Medicine

## 2015-06-20 ENCOUNTER — Other Ambulatory Visit (HOSPITAL_COMMUNITY): Payer: Self-pay | Admitting: *Deleted

## 2015-06-21 ENCOUNTER — Ambulatory Visit (HOSPITAL_COMMUNITY)
Admission: RE | Admit: 2015-06-21 | Discharge: 2015-06-21 | Disposition: A | Discharge status: Home / Self Care | Payer: BLUE CROSS/BLUE SHIELD | Source: Ambulatory Visit | Attending: Rheumatology | Admitting: Rheumatology

## 2015-06-21 DIAGNOSIS — L4050 Arthropathic psoriasis, unspecified: Secondary | ICD-10-CM | POA: Diagnosis not present

## 2015-06-21 LAB — CBC WITH DIFFERENTIAL/PLATELET
BASOS ABS: 0 10*3/uL (ref 0.0–0.1)
BASOS PCT: 0 %
EOS ABS: 0 10*3/uL (ref 0.0–0.7)
Eosinophils Relative: 1 %
HCT: 39.3 % (ref 36.0–46.0)
HEMOGLOBIN: 13.2 g/dL (ref 12.0–15.0)
Lymphocytes Relative: 45 %
Lymphs Abs: 3.4 10*3/uL (ref 0.7–4.0)
MCH: 32.4 pg (ref 26.0–34.0)
MCHC: 33.6 g/dL (ref 30.0–36.0)
MCV: 96.6 fL (ref 78.0–100.0)
Monocytes Absolute: 0.4 10*3/uL (ref 0.1–1.0)
Monocytes Relative: 6 %
NEUTROS PCT: 48 %
Neutro Abs: 3.7 10*3/uL (ref 1.7–7.7)
Platelets: 249 10*3/uL (ref 150–400)
RBC: 4.07 MIL/uL (ref 3.87–5.11)
RDW: 14.6 % (ref 11.5–15.5)
WBC: 7.6 10*3/uL (ref 4.0–10.5)

## 2015-06-21 LAB — COMPREHENSIVE METABOLIC PANEL
ALBUMIN: 3.9 g/dL (ref 3.5–5.0)
ALK PHOS: 73 U/L (ref 38–126)
ALT: 21 U/L (ref 14–54)
ANION GAP: 6 (ref 5–15)
AST: 25 U/L (ref 15–41)
BUN: 7 mg/dL (ref 6–20)
CALCIUM: 9.8 mg/dL (ref 8.9–10.3)
CO2: 26 mmol/L (ref 22–32)
Chloride: 109 mmol/L (ref 101–111)
Creatinine, Ser: 0.78 mg/dL (ref 0.44–1.00)
GFR calc Af Amer: >60 mL/min (ref >60)
GFR calc non Af Amer: >60 mL/min (ref >60)
GLUCOSE: 95 mg/dL (ref 65–99)
Potassium: 3.5 mmol/L (ref 3.5–5.1)
SODIUM: 141 mmol/L (ref 135–145)
Total Bilirubin: 0.5 mg/dL (ref 0.3–1.2)
Total Protein: 7.6 g/dL (ref 6.5–8.1)

## 2015-06-21 MED ORDER — DIPHENHYDRAMINE HCL 25 MG PO CAPS: 25.0000 mg | ORAL_CAPSULE | Freq: Once | ORAL | Status: DC

## 2015-06-21 MED ORDER — SODIUM CHLORIDE 0.9 % IV SOLN
300.0000 mg | Freq: Once | INTRAVENOUS | Status: AC
  Administered 2015-06-21: 300 mg via INTRAVENOUS
  Filled 2015-06-21: qty 30

## 2015-06-21 MED ORDER — ACETAMINOPHEN 325 MG PO TABS: 650.0000 mg | ORAL_TABLET | Freq: Once | ORAL | Status: DC

## 2015-06-21 MED ORDER — SODIUM CHLORIDE 0.9 % IV SOLN
Freq: Once | INTRAVENOUS | Status: AC
  Administered 2015-06-21: 09:00:00 via INTRAVENOUS

## 2015-06-21 NOTE — Discharge Instructions (Signed)
° °  [facility] CARE [address] [place], [place]  [address] Phone:  [phone]   June 21, 2015  Patient: April Cox  Date of Birth: [birth year]  Date of Visit: 06/21/2015    To Whom It May Concern:  Eknoor Dial was seen and treated  on 06/21/2015.            If you have any questions or concerns, please don't hesitate to call.   Sincerely,   Anette Guarneri RN    Treatment Team:  Attending Provider: Bo Merino, MD

## 2015-06-28 DIAGNOSIS — Z09 Encounter for follow-up examination after completed treatment for conditions other than malignant neoplasm: Secondary | ICD-10-CM | POA: Diagnosis not present

## 2015-06-28 DIAGNOSIS — M79671 Pain in right foot: Secondary | ICD-10-CM | POA: Diagnosis not present

## 2015-06-28 DIAGNOSIS — L408 Other psoriasis: Secondary | ICD-10-CM | POA: Diagnosis not present

## 2015-06-28 DIAGNOSIS — M79641 Pain in right hand: Secondary | ICD-10-CM | POA: Diagnosis not present

## 2015-06-30 ENCOUNTER — Other Ambulatory Visit: Payer: Self-pay | Admitting: Family Medicine

## 2015-07-25 ENCOUNTER — Other Ambulatory Visit: Payer: Self-pay | Admitting: Family Medicine

## 2015-07-25 NOTE — Telephone Encounter (Signed)
Is this okay to refill? 

## 2015-07-29 ENCOUNTER — Encounter (HOSPITAL_COMMUNITY): Payer: Self-pay

## 2015-07-29 ENCOUNTER — Emergency Department (HOSPITAL_COMMUNITY): Payer: BLUE CROSS/BLUE SHIELD

## 2015-07-29 ENCOUNTER — Emergency Department (HOSPITAL_COMMUNITY)
Admission: EM | Admit: 2015-07-29 | Discharge: 2015-07-30 | Disposition: A | Discharge status: Home / Self Care | Payer: BLUE CROSS/BLUE SHIELD | Attending: Emergency Medicine | Admitting: Emergency Medicine

## 2015-07-29 DIAGNOSIS — Z8679 Personal history of other diseases of the circulatory system: Secondary | ICD-10-CM | POA: Insufficient documentation

## 2015-07-29 DIAGNOSIS — E785 Hyperlipidemia, unspecified: Secondary | ICD-10-CM | POA: Diagnosis not present

## 2015-07-29 DIAGNOSIS — I10 Essential (primary) hypertension: Secondary | ICD-10-CM | POA: Insufficient documentation

## 2015-07-29 DIAGNOSIS — F17210 Nicotine dependence, cigarettes, uncomplicated: Secondary | ICD-10-CM | POA: Diagnosis not present

## 2015-07-29 DIAGNOSIS — Z791 Long term (current) use of non-steroidal anti-inflammatories (NSAID): Secondary | ICD-10-CM | POA: Insufficient documentation

## 2015-07-29 DIAGNOSIS — M7989 Other specified soft tissue disorders: Secondary | ICD-10-CM | POA: Diagnosis not present

## 2015-07-29 DIAGNOSIS — M79671 Pain in right foot: Secondary | ICD-10-CM | POA: Insufficient documentation

## 2015-07-29 DIAGNOSIS — Z792 Long term (current) use of antibiotics: Secondary | ICD-10-CM | POA: Diagnosis not present

## 2015-07-29 DIAGNOSIS — M1990 Unspecified osteoarthritis, unspecified site: Secondary | ICD-10-CM | POA: Diagnosis not present

## 2015-07-29 DIAGNOSIS — E039 Hypothyroidism, unspecified: Secondary | ICD-10-CM | POA: Diagnosis not present

## 2015-07-29 DIAGNOSIS — Z79899 Other long term (current) drug therapy: Secondary | ICD-10-CM | POA: Diagnosis not present

## 2015-07-29 NOTE — ED Notes (Signed)
Pt dropped a canning jar on her right foot and ankle

## 2015-07-30 DIAGNOSIS — M25571 Pain in right ankle and joints of right foot: Secondary | ICD-10-CM | POA: Diagnosis not present

## 2015-07-30 DIAGNOSIS — S9031XA Contusion of right foot, initial encounter: Secondary | ICD-10-CM | POA: Diagnosis not present

## 2015-07-30 NOTE — ED Notes (Signed)
Bed: WA04 Expected date:  Expected time:  Means of arrival:  Comments: 

## 2015-07-30 NOTE — ED Provider Notes (Signed)
CSN: [id number]     Arrival date & time 07/29/15  2236 History   First MD Initiated Contact with Patient 07/30/15 0033     Chief Complaint  Patient presents with  . Foot Pain    HPI Comments: 60 year old female presents with a right foot pain and swelling following an injury on Saturday. Past medical history significant for psoriatic arthritis. She states that she dropped a Mason jar on her right foot 2 days ago. She reports associated swelling, difficulty walking, pain radiating into the lower calf. She has not been elevating it or taking anything for pain if she is afraid of interactions with her arthritis medicine. She states she has been walking on it frequently as well.  Patient is a 60 y.o. female presenting with lower extremity pain.  Foot Pain Associated symptoms include arthralgias and joint swelling. Pertinent negatives include no numbness.    Past Medical History  Diagnosis Date  . Psoriatic arthritis ([facility])     Dr. Estanislado Pandy  . Hyperlipidemia   . Hypothyroidism   . Diverticulosis of colon   . Mild aortic valve stenosis     per echo 10-09-2010  ef 60-65%  valva area 1.48cm  . Psoriasis   . History of adenomatous polyp of colon     11-06-2009  tubullar  . Abnormal Pap smear of cervix   . HPV in female   . Family history of adverse reaction to anesthesia     mother PONV  . Wears glasses   . Hypertension   . Heart murmur     asymptomatic per pt  . GERD (gastroesophageal reflux disease)   . Arthritis   . Anemia     history of anemia   Past Surgical History  Procedure Laterality Date  . Peroneal nerve decompression Left 12-27-2009    for neuropathy with  foot drop  . Colonoscopy  11-06-2009  . Anterior lat lumbar fusion  12/25/2011    Procedure: ANTERIOR LATERAL LUMBAR FUSION 1 LEVEL;  Surgeon: Eustace Moore, MD;  Location: [facility];  Service: Neurosurgery;  Laterality: Left;  LUMBAR FOUR-FIVE  . Lumbar percutaneous pedicle screw 1 level  12/25/2011    Procedure:  LUMBAR PERCUTANEOUS PEDICLE SCREW 1 LEVEL;  Surgeon: Eustace Moore, MD;  Location: [facility];  Service: Neurosurgery;  Laterality: Left;  LUMBAR FOUR-FIVE  . Hysteroscopy w/d&c  04-04-2008  . Transthoracic echocardiogram  10-09-2010    mild LVH,  ef 60-65%,  grade I diastolic dysfunction,  very mild AV stenosis (area 1.48cm/S/2(VTI) with no regurg./  mild MV calcification without stenosis or regur. /  trivial TR  . Carpal tunnel release Right April 2015  . Cervical conization w/bx N/A 08/28/2014    Procedure: CONIZATION CERVIX WITH BIOPSY;  Surgeon: Ena Dawley, MD;  Location: [facility];  Service: Gynecology;  Laterality: N/A;  . Dental surgery    . Vaginal hysterectomy Bilateral 12/19/2014    Procedure: TOTAL VAGINAL HYSTERECTOMY WITH BILATERAL SALPINGECTOMY;  Surgeon: Ena Dawley, MD;  Location: [facility];  Service: Gynecology;  Laterality: Bilateral;  . Bladder suspension N/A 12/19/2014    Procedure: TRANSVAGINAL TAPE (TVT) PROCEDURE;  Surgeon: Everett Graff, MD;  Location: [facility];  Service: Gynecology;  Laterality: N/A;  . Cysto N/A 12/19/2014    Procedure: CYSTO;  Surgeon: Everett Graff, MD;  Location: [facility];  Service: Gynecology;  Laterality: N/A;   Family History  Problem Relation Age of Onset  . Arthritis Mother     OA   .  Psoriasis Mother   . Other Father     accidental death/MVA at age 55  . Hypertension Brother   . Other Brother     1 brother drowned  . Hypertension Maternal Grandmother   . Diabetes Maternal Grandmother   . Hypertension Maternal Grandfather   . Heart disease Neg Hx   . Cancer Neg Hx    Social History  Substance Use Topics  . Smoking status: Current Some Day Smoker -- 0.25 packs/day for 5 years    Types: Cigarettes  . Smokeless tobacco: Never Used  . Alcohol Use: 3.5 oz/week    7 Standard drinks or equivalent per week     Comment: OCCASIONAL   OB History    No data available     Review of Systems  Musculoskeletal:  Positive for joint swelling, arthralgias and gait problem.  Neurological: Negative for numbness.      Allergies  Codeine; Other; Penicillins; and Gluten meal  Home Medications   Prior to Admission medications   Medication Sig Start Date End Date Taking? Authorizing Provider  atorvastatin (LIPITOR) 40 MG tablet Take 1 tablet (40 mg total) by mouth every morning. 09/10/14   Denita Lung, MD  calcium-vitamin D (OSCAL WITH D) 500-200 MG-UNIT per tablet Take 1 tablet by mouth 2 (two) times daily.      Historical Provider, MD  Cholecalciferol (VITAMIN D PO) Take 500 tablets by mouth daily.     Historical Provider, MD  ciprofloxacin (CIPRO) 250 MG tablet Take 1 tablet (250 mg total) by mouth 2 (two) times daily. 12/20/14   Ena Dawley, MD  clobetasol ointment (TEMOVATE) [id number] % Apply 1 application topically as needed (psiorisis).  09/11/14   Historical Provider, MD  EPIPEN 2-PAK 0.3 MG/0.3ML SOAJ injection INJECT 0.3 MLS (0.3 MG TOTAL) INTO THE MUSCLE ONCE. 04/08/15   Denita Lung, MD  fish oil-omega-3 fatty acids 1000 MG capsule Take 1 g by mouth daily.     Historical Provider, MD  folic acid (FOLVITE) 1 MG tablet Take 2 mg by mouth daily.     Historical Provider, MD  inFLIXimab (REMICADE) 100 MG injection Inject 100 mg into the vein every 6 (six) weeks. Last inj. 10/08    Historical Provider, MD  levothyroxine (SYNTHROID) 25 MCG tablet Take 1 tablet (25 mcg total) by mouth daily before breakfast. 09/10/14   Denita Lung, MD  MAGNESIUM PO Take 20 mg by mouth 2 (two) times daily.     Historical Provider, MD  meperidine (DEMEROL) 50 MG tablet Take 1 tablet (50 mg total) by mouth every 4 (four) hours as needed for severe pain. 12/20/14   Ena Dawley, MD  Methotrexate, Anti-Rheumatic, (METHOTREXATE, PF, [place]) Inject 0.8 mg into the skin once a week. Saturday's    Historical Provider, MD  metoprolol succinate (TOPROL-XL) 50 MG 24 hr tablet TAKE 1 TABLET (50 MG TOTAL) BY MOUTH DAILY. TAKE WITH OR  IMMEDIATELY FOLLOWING A MEAL. 07/01/15   Denita Lung, MD  naproxen sodium (ANAPROX) 220 MG tablet Take 220 mg by mouth 2 (two) times daily as needed (dental pain).    Historical Provider, MD  naproxen sodium (ANAPROX) 550 MG tablet Take 1 tablet (550 mg total) by mouth 2 (two) times daily with a meal. 12/20/14   Ena Dawley, MD  nebivolol (BYSTOLIC) 5 MG tablet TAKE 1 TABLET (5 MG TOTAL) BY MOUTH DAILY. 09/10/14   Denita Lung, MD  omeprazole (PRILOSEC) 40 MG capsule Take 1 capsule (40 mg  total) by mouth daily. 09/10/14   Denita Lung, MD  OVER THE COUNTER MEDICATION Take 1,000 mg by mouth daily. Magnesium maleate    Historical Provider, MD  pantoprazole (PROTONIX) 40 MG tablet TAKE 1 TABLET BY MOUTH DAILY 07/25/15   Denita Lung, MD  vitamin C (ASCORBIC ACID) 250 MG tablet Take 250 mg by mouth daily.      Historical Provider, MD   BP 107/70 mmHg  Pulse 89  Temp(Src) 98.6 F (37 C) (Oral)  Resp 20  Ht 5\' 7"  (1.702 m)  Wt 62.596 kg  BMI 21.61 kg/m2  SpO2 98% Physical Exam  Constitutional: She is oriented to person, place, and time. She appears well-developed and well-nourished. No distress.  HENT:  Head: Normocephalic and atraumatic.  Eyes: Conjunctivae are normal. Pupils are equal, round, and reactive to light. Right eye exhibits no discharge. Left eye exhibits no discharge. No scleral icterus.  Neck: Normal range of motion.  Cardiovascular: Normal rate.   Pulmonary/Chest: Effort normal. No respiratory distress.  Abdominal: She exhibits no distension.  Musculoskeletal:  Right foot: Mild swelling of dorsum of foot which extends to ankle and lower calf. No deformity. Mild tenderness to palpation of dorsum of foot, ankle, and lower calf. FROM of toes. Decreased ROM of ankle. N/V intact.    Neurological: She is alert and oriented to person, place, and time.  Skin: Skin is warm and dry.  Psychiatric: She has a normal mood and affect. Her behavior is normal.    ED Course   Procedures (including critical care time) Labs Review Labs Reviewed - No data to display  Imaging Review Dg Foot Complete Right  07/29/2015  CLINICAL DATA:  Proximal right foot pain and swelling after dropping an object on the foot tonight. EXAM: RIGHT FOOT COMPLETE - 3+ VIEW COMPARISON:  None. FINDINGS: Degenerative changes in the intertarsal and tarsometatarsal joints. Mild soft tissue swelling along the dorsum of the right foot. No evidence of acute fracture or dislocation. No focal bone lesion or bone destruction. No radiopaque soft tissue foreign bodies. IMPRESSION: Degenerative changes in the right foot. No acute fracture or dislocation. Electronically Signed   By: Lucienne Capers M.D.   On: 07/29/2015 23:59   I have personally reviewed and evaluated these images and lab results as part of my medical decision-making.   EKG Interpretation None      MDM   Final diagnoses:  Right foot pain   60 year old female presents with hip pain following an injury. X-rays negative for fracture. Discussed with patient RICE protocol. Crutches and an ASO brace applied for comfort. Patient is refusing pain medicine. She states she will follow up with orthopedics tomorrow. Patient is NAD, non-toxic, with stable VS. Patient is informed of clinical course, understands medical decision making process, and agrees with plan. Opportunity for questions provided and all questions answered. Return precautions given.     Recardo Evangelist, PA-C 07/30/15 [place], MD 07/31/15 [phone]

## 2015-08-01 ENCOUNTER — Other Ambulatory Visit (HOSPITAL_COMMUNITY): Payer: Self-pay | Admitting: *Deleted

## 2015-08-02 ENCOUNTER — Ambulatory Visit (HOSPITAL_COMMUNITY)
Admission: RE | Admit: 2015-08-02 | Discharge: 2015-08-02 | Disposition: A | Discharge status: Home / Self Care | Payer: BLUE CROSS/BLUE SHIELD | Source: Ambulatory Visit | Attending: Rheumatology | Admitting: Rheumatology

## 2015-08-02 DIAGNOSIS — L4050 Arthropathic psoriasis, unspecified: Secondary | ICD-10-CM | POA: Diagnosis not present

## 2015-08-02 LAB — COMPREHENSIVE METABOLIC PANEL
ALT: 14 U/L (ref 14–54)
AST: 18 U/L (ref 15–41)
Albumin: 3.5 g/dL (ref 3.5–5.0)
Alkaline Phosphatase: 76 U/L (ref 38–126)
Anion gap: 8 (ref 5–15)
BILIRUBIN TOTAL: 0.8 mg/dL (ref 0.3–1.2)
BUN: 7 mg/dL (ref 6–20)
CHLORIDE: 104 mmol/L (ref 101–111)
CO2: 23 mmol/L (ref 22–32)
CREATININE: 0.65 mg/dL (ref 0.44–1.00)
Calcium: 9.3 mg/dL (ref 8.9–10.3)
GFR calc Af Amer: >60 mL/min (ref >60)
Glucose, Bld: 101 mg/dL — ABNORMAL HIGH (ref 65–99)
Potassium: 3.6 mmol/L (ref 3.5–5.1)
Sodium: 135 mmol/L (ref 135–145)
TOTAL PROTEIN: 7 g/dL (ref 6.5–8.1)

## 2015-08-02 LAB — CBC WITH DIFFERENTIAL/PLATELET
BASOS ABS: 0 10*3/uL (ref 0.0–0.1)
Basophils Relative: 0 %
Eosinophils Absolute: 0.1 10*3/uL (ref 0.0–0.7)
Eosinophils Relative: 1 %
HEMATOCRIT: 36.9 % (ref 36.0–46.0)
Hemoglobin: 12.6 g/dL (ref 12.0–15.0)
LYMPHS PCT: 28 %
Lymphs Abs: 1.7 10*3/uL (ref 0.7–4.0)
MCH: 33.2 pg (ref 26.0–34.0)
MCHC: 34.1 g/dL (ref 30.0–36.0)
MCV: 97.1 fL (ref 78.0–100.0)
Monocytes Absolute: 0.3 10*3/uL (ref 0.1–1.0)
Monocytes Relative: 5 %
NEUTROS ABS: 4 10*3/uL (ref 1.7–7.7)
NEUTROS PCT: 66 %
Platelets: 206 10*3/uL (ref 150–400)
RBC: 3.8 MIL/uL — AB (ref 3.87–5.11)
RDW: 13.2 % (ref 11.5–15.5)
WBC: 6 10*3/uL (ref 4.0–10.5)

## 2015-08-02 MED ORDER — ACETAMINOPHEN 325 MG PO TABS: 650.0000 mg | ORAL_TABLET | ORAL | Status: DC

## 2015-08-02 MED ORDER — SODIUM CHLORIDE 0.9 % IV SOLN
INTRAVENOUS | Status: DC
  Administered 2015-08-02: 10:00:00 via INTRAVENOUS

## 2015-08-02 MED ORDER — SODIUM CHLORIDE 0.9 % IV SOLN
6.0000 mg/kg | INTRAVENOUS | Status: DC
  Administered 2015-08-02: 400 mg via INTRAVENOUS
  Filled 2015-08-02: qty 40

## 2015-08-02 MED ORDER — DIPHENHYDRAMINE HCL 25 MG PO CAPS: 25.0000 mg | ORAL_CAPSULE | ORAL | Status: DC

## 2015-08-02 NOTE — Discharge Instructions (Signed)
Excuse from Work, [employer], or Physical Activity   April Cox needs to be excused from: ____X_ Work   beginning now and through the following date: _07/14/2017_   Health Care Provider Name (printed): Lattie Haw Mykael Trott,RN________________________________________  [facility] Provider (signature): X___________________________________________ Date: _07/14/2017_____   This information is not intended to replace advice given to you by your health care provider. Make sure you discuss any questions you have with your health care provider.   Document Released: 07/01/2000 Document Revised: 01/26/2014 Document Reviewed: 08/07/2013 Elsevier Interactive Patient Education [employer].

## 2015-08-07 LAB — QUANTIFERON IN TUBE
QUANTIFERON TB AG VALUE: 0.04 IU/mL
QUANTIFERON TB GOLD: NEGATIVE
Quantiferon Nil Value: 0.07 IU/mL

## 2015-08-07 LAB — QUANTIFERON TB GOLD ASSAY (BLOOD)

## 2015-08-29 DIAGNOSIS — M7551 Bursitis of right shoulder: Secondary | ICD-10-CM | POA: Diagnosis not present

## 2015-08-29 DIAGNOSIS — Z09 Encounter for follow-up examination after completed treatment for conditions other than malignant neoplasm: Secondary | ICD-10-CM | POA: Diagnosis not present

## 2015-08-29 DIAGNOSIS — L408 Other psoriasis: Secondary | ICD-10-CM | POA: Diagnosis not present

## 2015-08-29 DIAGNOSIS — M79641 Pain in right hand: Secondary | ICD-10-CM | POA: Diagnosis not present

## 2015-08-30 ENCOUNTER — Other Ambulatory Visit: Payer: Self-pay | Admitting: Family Medicine

## 2015-09-12 ENCOUNTER — Other Ambulatory Visit (HOSPITAL_COMMUNITY): Payer: Self-pay | Admitting: *Deleted

## 2015-09-13 ENCOUNTER — Ambulatory Visit (HOSPITAL_COMMUNITY)
Admission: RE | Admit: 2015-09-13 | Discharge: 2015-09-13 | Disposition: A | Discharge status: Home / Self Care | Payer: BLUE CROSS/BLUE SHIELD | Source: Ambulatory Visit | Attending: Rheumatology | Admitting: Rheumatology

## 2015-09-13 DIAGNOSIS — L4050 Arthropathic psoriasis, unspecified: Secondary | ICD-10-CM | POA: Insufficient documentation

## 2015-09-13 LAB — COMPREHENSIVE METABOLIC PANEL
ALT: 15 U/L (ref 14–54)
AST: 17 U/L (ref 15–41)
Albumin: 3.8 g/dL (ref 3.5–5.0)
Alkaline Phosphatase: 65 U/L (ref 38–126)
Anion gap: 6 (ref 5–15)
BUN: 6 mg/dL (ref 6–20)
CHLORIDE: 106 mmol/L (ref 101–111)
CO2: 27 mmol/L (ref 22–32)
CREATININE: 0.63 mg/dL (ref 0.44–1.00)
Calcium: 9.6 mg/dL (ref 8.9–10.3)
Glucose, Bld: 92 mg/dL (ref 65–99)
POTASSIUM: 3.5 mmol/L (ref 3.5–5.1)
SODIUM: 139 mmol/L (ref 135–145)
Total Bilirubin: 0.4 mg/dL (ref 0.3–1.2)
Total Protein: 7 g/dL (ref 6.5–8.1)

## 2015-09-13 LAB — CBC WITH DIFFERENTIAL/PLATELET
BASOS ABS: 0 10*3/uL (ref 0.0–0.1)
Basophils Relative: 0 %
EOS ABS: 0.1 10*3/uL (ref 0.0–0.7)
EOS PCT: 1 %
HCT: 40 % (ref 36.0–46.0)
Hemoglobin: 13.3 g/dL (ref 12.0–15.0)
LYMPHS ABS: 3.3 10*3/uL (ref 0.7–4.0)
Lymphocytes Relative: 38 %
MCH: 33 pg (ref 26.0–34.0)
MCHC: 33.3 g/dL (ref 30.0–36.0)
MCV: 99.3 fL (ref 78.0–100.0)
MONO ABS: 0.4 10*3/uL (ref 0.1–1.0)
Monocytes Relative: 5 %
Neutro Abs: 5 10*3/uL (ref 1.7–7.7)
Neutrophils Relative %: 56 %
PLATELETS: 263 10*3/uL (ref 150–400)
RBC: 4.03 MIL/uL (ref 3.87–5.11)
RDW: 12.9 % (ref 11.5–15.5)
WBC: 8.8 10*3/uL (ref 4.0–10.5)

## 2015-09-13 MED ORDER — SODIUM CHLORIDE 0.9 % IV SOLN
6.0000 mg/kg | INTRAVENOUS | Status: AC
  Administered 2015-09-13: 400 mg via INTRAVENOUS
  Filled 2015-09-13: qty 20

## 2015-09-13 MED ORDER — SODIUM CHLORIDE 0.9 % IV SOLN
INTRAVENOUS | Status: AC
  Administered 2015-09-13: 10:00:00 via INTRAVENOUS

## 2015-09-13 MED ORDER — ACETAMINOPHEN 325 MG PO TABS: 650.0000 mg | ORAL_TABLET | ORAL | Status: DC

## 2015-09-13 MED ORDER — DIPHENHYDRAMINE HCL 25 MG PO CAPS: 25.0000 mg | ORAL_CAPSULE | ORAL | Status: DC

## 2015-09-23 DIAGNOSIS — L4050 Arthropathic psoriasis, unspecified: Secondary | ICD-10-CM

## 2015-09-27 ENCOUNTER — Other Ambulatory Visit: Payer: Self-pay | Admitting: Family Medicine

## 2015-09-27 NOTE — Telephone Encounter (Signed)
Pt has an appt in october

## 2015-10-21 ENCOUNTER — Telehealth: Payer: Self-pay

## 2015-10-21 ENCOUNTER — Other Ambulatory Visit: Payer: Self-pay

## 2015-10-21 NOTE — Telephone Encounter (Signed)
[facility] called about pantoprazole and methotrexate and severe interaction between the two medications. They question if you want pt to still get pantoprazole refilled.   Asked pharmacist if she has taken medications before and she said yes, but their new computer system flagged this. It does not state the interaction specifically, but pharmacy questions if they can fill pantoprazole. Victorino December

## 2015-10-21 NOTE — Telephone Encounter (Signed)
Get more information on the possible interaction and if there is a different PPI we can use

## 2015-10-21 NOTE — Telephone Encounter (Signed)
April Cox has already called about this

## 2015-10-23 ENCOUNTER — Ambulatory Visit (INDEPENDENT_AMBULATORY_CARE_PROVIDER_SITE_OTHER): Payer: BLUE CROSS/BLUE SHIELD | Admitting: Rheumatology

## 2015-10-23 DIAGNOSIS — Z09 Encounter for follow-up examination after completed treatment for conditions other than malignant neoplasm: Secondary | ICD-10-CM

## 2015-10-23 DIAGNOSIS — M79641 Pain in right hand: Secondary | ICD-10-CM | POA: Diagnosis not present

## 2015-10-23 DIAGNOSIS — L408 Other psoriasis: Secondary | ICD-10-CM | POA: Diagnosis not present

## 2015-10-23 DIAGNOSIS — M25511 Pain in right shoulder: Secondary | ICD-10-CM | POA: Diagnosis not present

## 2015-10-24 ENCOUNTER — Other Ambulatory Visit (HOSPITAL_COMMUNITY): Payer: Self-pay | Admitting: *Deleted

## 2015-10-25 ENCOUNTER — Ambulatory Visit (HOSPITAL_COMMUNITY)
Admission: RE | Admit: 2015-10-25 | Discharge: 2015-10-25 | Disposition: A | Discharge status: Home / Self Care | Payer: BLUE CROSS/BLUE SHIELD | Source: Ambulatory Visit | Attending: Rheumatology | Admitting: Rheumatology

## 2015-10-25 DIAGNOSIS — L4050 Arthropathic psoriasis, unspecified: Secondary | ICD-10-CM | POA: Insufficient documentation

## 2015-10-25 LAB — COMPREHENSIVE METABOLIC PANEL
ALT: 20 U/L (ref 14–54)
AST: 24 U/L (ref 15–41)
Albumin: 4.2 g/dL (ref 3.5–5.0)
Alkaline Phosphatase: 84 U/L (ref 38–126)
Anion gap: 10 (ref 5–15)
BUN: <5 mg/dL — ABNORMAL LOW (ref 6–20)
CO2: 27 mmol/L (ref 22–32)
Calcium: 9.7 mg/dL (ref 8.9–10.3)
Chloride: 103 mmol/L (ref 101–111)
Creatinine, Ser: 0.62 mg/dL (ref 0.44–1.00)
GFR calc Af Amer: >60 mL/min (ref >60)
GFR calc non Af Amer: >60 mL/min (ref >60)
Glucose, Bld: 83 mg/dL (ref 65–99)
Potassium: 3.5 mmol/L (ref 3.5–5.1)
Sodium: 140 mmol/L (ref 135–145)
Total Bilirubin: 0.6 mg/dL (ref 0.3–1.2)
Total Protein: 7.8 g/dL (ref 6.5–8.1)

## 2015-10-25 LAB — DIFFERENTIAL
Basophils Absolute: 0 10*3/uL (ref 0.0–0.1)
Basophils Relative: 0 %
Eosinophils Absolute: 0.1 10*3/uL (ref 0.0–0.7)
Eosinophils Relative: 1 %
Lymphocytes Relative: 33 %
Lymphs Abs: 3.1 10*3/uL (ref 0.7–4.0)
Monocytes Absolute: 0.6 10*3/uL (ref 0.1–1.0)
Monocytes Relative: 6 %
Neutro Abs: 5.7 10*3/uL (ref 1.7–7.7)
Neutrophils Relative %: 60 %

## 2015-10-25 LAB — CBC
HCT: 41.1 % (ref 36.0–46.0)
Hemoglobin: 13.7 g/dL (ref 12.0–15.0)
MCH: 32.9 pg (ref 26.0–34.0)
MCHC: 33.3 g/dL (ref 30.0–36.0)
MCV: 98.8 fL (ref 78.0–100.0)
Platelets: 304 10*3/uL (ref 150–400)
RBC: 4.16 MIL/uL (ref 3.87–5.11)
RDW: 12.8 % (ref 11.5–15.5)
WBC: 9.5 10*3/uL (ref 4.0–10.5)

## 2015-10-25 MED ORDER — SODIUM CHLORIDE 0.9 % IV SOLN
INTRAVENOUS | Status: DC
  Administered 2015-10-25: 12:00:00 via INTRAVENOUS

## 2015-10-25 MED ORDER — DIPHENHYDRAMINE HCL 25 MG PO CAPS: 25.0000 mg | ORAL_CAPSULE | ORAL | Status: DC

## 2015-10-25 MED ORDER — ACETAMINOPHEN 325 MG PO TABS: 650.0000 mg | ORAL_TABLET | ORAL | Status: DC

## 2015-10-25 MED ORDER — SODIUM CHLORIDE 0.9 % IV SOLN
6.0000 mg/kg | INTRAVENOUS | Status: DC
  Administered 2015-10-25: 400 mg via INTRAVENOUS
  Filled 2015-10-25: qty 20

## 2015-10-25 NOTE — Discharge Instructions (Signed)
Return to Work __________________________________________________ was treated at our facility. INJURY OR ILLNESS WAS: _____ Work related _____ Not work related _____ Undetermined if work related April Cox may return to work on ____________________.  Employee may return to modified work on ____________________. WORK ACTIVITY RESTRICTIONS Work activities that are not tolerated include: _____ Bending _____ Prolonged sitting _____ Lifting more than ____________________ lb _____ Squatting _____ Prolonged standing _____ Climbing _____ Reaching _____ Pushing and pulling _____ Walking _____ Other ____________________ These restrictions are effective until ____________________. Show this Return to Work statement to your supervisor at work as soon as possible. Your employer should be aware of your condition and can help with the necessary work activity restrictions. If you wish to return to work sooner than the date that is listed above, or if you have further problems that make it difficult for you to return at that time, please call our clinic or your health care provider. _________________________________________ Health Care Provider Name (printed) _________________________________________ Health Care Provider (signature)  _________________________________________ Date   This information is not intended to replace advice given to you by your health care provider. Make sure you discuss any questions you have with your health care provider.   Document Released: 01/05/2005 Document Revised: 01/26/2014 Document Reviewed: 08/04/2013 Elsevier Interactive Patient Education [employer].

## 2015-11-19 ENCOUNTER — Telehealth: Payer: Self-pay | Admitting: Rheumatology

## 2015-11-19 NOTE — Telephone Encounter (Signed)
Okay to give a prednisone taper for right now. Please reschedule a follow-up appointment.

## 2015-11-19 NOTE — Telephone Encounter (Signed)
You evaluated her on 10/23/15, she was having flare then, you advised Remicade 6 mg/kg IV every 6 weeks and methotrexate 0.8 mL subcutaneously.  Her labs are within normal limits.  We will increase methotrexate to 1 mL subcutaneously and see if this will control her arthritis or not.  I also discussed maybe switching her Remicade in future if she does not do well on 1 mL of methotrexate and current dose of Remicade.    She is c/o flare, going out of town, would like pred taper, do you want to repeat taper?  You gave her taper 20mg  x4d, 10mg  x4d.... On 10/23/15

## 2015-11-19 NOTE — Telephone Encounter (Signed)
Patient states she is having a flare and leaves to go out of town on Thursday. Patient would like to know if she can get a rx for prednisone or does she need to be seen? Please advise.

## 2015-11-20 MED ORDER — PREDNISONE 5 MG PO TABS: ORAL_TABLET | ORAL | 0 refills | Status: DC

## 2015-11-20 NOTE — Addendum Note (Signed)
Addended byCandice Camp on: 11/20/2015 08:09 AM   Modules accepted: Orders

## 2015-11-20 NOTE — Telephone Encounter (Signed)
Left message to advise. Asked her to call for work in appt if she continues to flare

## 2015-11-23 ENCOUNTER — Other Ambulatory Visit: Payer: Self-pay | Admitting: Family Medicine

## 2015-11-25 ENCOUNTER — Other Ambulatory Visit: Payer: Self-pay | Admitting: Family Medicine

## 2015-11-25 ENCOUNTER — Telehealth: Payer: Self-pay | Admitting: Family Medicine

## 2015-11-25 MED ORDER — METOPROLOL SUCCINATE ER 50 MG PO TB24: 50.0000 mg | ORAL_TABLET | Freq: Every day | ORAL | 0 refills | Status: DC

## 2015-11-25 NOTE — Telephone Encounter (Signed)
April Cox req refill for Metoprolol succ ER 50 mg.   Rev chart also time for cpe, called pt lmtrc re cpe.

## 2015-11-25 NOTE — Telephone Encounter (Signed)
rx renewed for 30 days. Pt will need OV for further refills.

## 2015-11-27 ENCOUNTER — Other Ambulatory Visit: Payer: Self-pay | Admitting: Family Medicine

## 2015-12-05 ENCOUNTER — Other Ambulatory Visit: Payer: Self-pay | Admitting: Family Medicine

## 2015-12-05 ENCOUNTER — Other Ambulatory Visit: Payer: Self-pay

## 2015-12-06 ENCOUNTER — Ambulatory Visit (HOSPITAL_COMMUNITY)
Admission: RE | Admit: 2015-12-06 | Discharge: 2015-12-06 | Disposition: A | Discharge status: Home / Self Care | Payer: BLUE CROSS/BLUE SHIELD | Source: Ambulatory Visit | Attending: Rheumatology | Admitting: Rheumatology

## 2015-12-06 DIAGNOSIS — L4050 Arthropathic psoriasis, unspecified: Secondary | ICD-10-CM | POA: Diagnosis not present

## 2015-12-06 LAB — CBC WITH DIFFERENTIAL/PLATELET
BASOS ABS: 0 10*3/uL (ref 0.0–0.1)
BASOS PCT: 0 %
EOS ABS: 0.1 10*3/uL (ref 0.0–0.7)
Eosinophils Relative: 1 %
HEMATOCRIT: 37.2 % (ref 36.0–46.0)
Hemoglobin: 12.7 g/dL (ref 12.0–15.0)
Lymphocytes Relative: 31 %
Lymphs Abs: 2.3 10*3/uL (ref 0.7–4.0)
MCH: 32.8 pg (ref 26.0–34.0)
MCHC: 34.1 g/dL (ref 30.0–36.0)
MCV: 96.1 fL (ref 78.0–100.0)
MONO ABS: 0.4 10*3/uL (ref 0.1–1.0)
MONOS PCT: 6 %
NEUTROS ABS: 4.6 10*3/uL (ref 1.7–7.7)
Neutrophils Relative %: 62 %
PLATELETS: 243 10*3/uL (ref 150–400)
RBC: 3.87 MIL/uL (ref 3.87–5.11)
RDW: 12.9 % (ref 11.5–15.5)
WBC: 7.3 10*3/uL (ref 4.0–10.5)

## 2015-12-06 LAB — COMPREHENSIVE METABOLIC PANEL
ALBUMIN: 3.7 g/dL (ref 3.5–5.0)
ALT: 15 U/L (ref 14–54)
ANION GAP: 8 (ref 5–15)
AST: 20 U/L (ref 15–41)
Alkaline Phosphatase: 59 U/L (ref 38–126)
BUN: 10 mg/dL (ref 6–20)
CHLORIDE: 108 mmol/L (ref 101–111)
CO2: 23 mmol/L (ref 22–32)
Calcium: 9.3 mg/dL (ref 8.9–10.3)
Creatinine, Ser: 0.83 mg/dL (ref 0.44–1.00)
GFR calc Af Amer: >60 mL/min (ref >60)
GFR calc non Af Amer: >60 mL/min (ref >60)
GLUCOSE: 100 mg/dL — AB (ref 65–99)
POTASSIUM: 3.9 mmol/L (ref 3.5–5.1)
SODIUM: 139 mmol/L (ref 135–145)
TOTAL PROTEIN: 6.6 g/dL (ref 6.5–8.1)
Total Bilirubin: 0.8 mg/dL (ref 0.3–1.2)

## 2015-12-06 MED ORDER — SODIUM CHLORIDE 0.9 % IV SOLN
6.0000 mg/kg | INTRAVENOUS | Status: DC
  Administered 2015-12-06: 400 mg via INTRAVENOUS
  Filled 2015-12-06: qty 40

## 2015-12-06 MED ORDER — SODIUM CHLORIDE 0.9 % IV SOLN
INTRAVENOUS | Status: DC
  Administered 2015-12-06: 13:00:00 via INTRAVENOUS

## 2015-12-06 MED ORDER — DIPHENHYDRAMINE HCL 25 MG PO TABS
25.0000 mg | ORAL_TABLET | Freq: Once | ORAL | Status: DC
  Filled 2015-12-06: qty 1

## 2015-12-06 MED ORDER — ACETAMINOPHEN 325 MG PO TABS: 650.0000 mg | ORAL_TABLET | Freq: Once | ORAL | Status: DC

## 2015-12-06 NOTE — Progress Notes (Signed)
Normal labs.

## 2015-12-06 NOTE — Discharge Instructions (Signed)
Introduction _[name]_ needs to be excused from: __X__ Work ____ [employer] ____ Physical activity beginning now and through the following date: _____11/17/2017___________. He or she may return to work or school ______11/17/2017_________.  ___X_ He or she may return to full physical activity as of _____11/17/2017___________. Health Care Provider Name (printed): ___Kristin Gillian Scarce RN ________________________ [facility] Provider (signature): ___________________________________________ Date: 12/06/2015 This information is not intended to replace advice given to you by your health care provider. Make sure you discuss any questions you have with your health care provider. Document Released: 07/01/2000 Document Revised: 07/26/2015 Document Reviewed: 08/07/2013  2017 Elsevier

## 2015-12-10 ENCOUNTER — Telehealth: Payer: Self-pay | Admitting: Radiology

## 2015-12-10 NOTE — Telephone Encounter (Signed)
I have called patient to advise labs are normal  

## 2015-12-10 NOTE — Telephone Encounter (Signed)
I will call patient to advise labs are normal  

## 2015-12-30 ENCOUNTER — Other Ambulatory Visit: Payer: Self-pay | Admitting: Family Medicine

## 2016-01-07 DIAGNOSIS — Z79899 Other long term (current) drug therapy: Secondary | ICD-10-CM | POA: Insufficient documentation

## 2016-01-07 DIAGNOSIS — M47816 Spondylosis without myelopathy or radiculopathy, lumbar region: Secondary | ICD-10-CM | POA: Insufficient documentation

## 2016-01-07 NOTE — Progress Notes (Signed)
Office Visit Note  Patient: April Cox             Date of Birth: Feb 08, 1955           MRN: [id number]             PCP: Wyatt Haste, MD Referring: Denita Lung, MD Visit Date: 01/08/2016 Occupation: Media planner    Subjective:  Left fourth trigger finger   History of Present Illness: April Cox is a 60 y.o. female with history of psoriatic arthritis and psoriasis. She was on methotrexate 0.8 ML subcutaneously per week which was increased to 1.0 mL per week last visit. She has noticed improvement in her flares. She still has morning stiffness and some swelling in her hands in the morning it gets better during the daytime. She's been suffering from left fourth trigger finger which is been causing pain. She continues to have plantar fasciitis in her feet which is improved. No psoriasis flare in last 2 years.  Activities of Daily Living:  Patient reports morning stiffness for 30-60 minutes.   Patient Denies nocturnal pain.  Difficulty dressing/grooming: Denies Difficulty climbing stairs: Denies Difficulty getting out of chair: Denies Difficulty using hands for taps, buttons, cutlery, and/or writing: Reports   Review of Systems  Constitutional: Positive for fatigue. Negative for night sweats, weight gain, weight loss and weakness.  HENT: Negative for mouth sores, trouble swallowing, trouble swallowing, mouth dryness and nose dryness.   Eyes: Positive for dryness. Negative for pain, redness and visual disturbance.  Respiratory: Negative for cough, shortness of breath and difficulty breathing.   Cardiovascular: Negative for chest pain, palpitations, hypertension, irregular heartbeat and swelling in legs/feet.  Gastrointestinal: Negative for blood in stool, constipation and diarrhea.  Endocrine: Negative for increased urination.  Genitourinary: Negative for vaginal dryness.  Musculoskeletal: Positive for arthralgias, joint pain, joint swelling and morning  stiffness. Negative for myalgias, muscle weakness, muscle tenderness and myalgias.  Skin: Negative for color change, rash, hair loss, skin tightness, ulcers and sensitivity to sunlight.  Allergic/Immunologic: Negative for susceptible to infections.  Neurological: Negative for dizziness, memory loss and night sweats.  Hematological: Negative for swollen glands.  Psychiatric/Behavioral: Negative for depressed mood and sleep disturbance. The patient is not nervous/anxious.     PMFS History:  Patient Active Problem List   Diagnosis Date Noted  . Other psoriasis 01/07/2016  . Primary osteoarthritis of both hands 01/07/2016  . Spondylosis of lumbar region without myelopathy or radiculopathy 01/07/2016  . High risk medication use 01/07/2016  . Acute blood loss anemia 12/20/2014  . Pelvic relaxation 12/20/2014  . CIN III (cervical intraepithelial neoplasia grade III) with severe dysplasia 12/19/2014  . Stress incontinence of urine 12/19/2014  . CIN III (cervical intraepithelial neoplasia III) 12/19/2014  . Essential hypertension 09/10/2014  . History of HPV infection 09/10/2014  . History of colonic polyps 09/10/2014  . Osteopenia 09/10/2014  . Gastroesophageal reflux disease without esophagitis 09/10/2014  . Cigarette smoker 05/11/2011  . Hyperlipidemia 08/22/2010  . Hypothyroidism 08/22/2010  . Psoriatic arthritis ([place]) 08/22/2010    Past Medical History:  Diagnosis Date  . Abnormal Pap smear of cervix   . Anemia    history of anemia  . Arthritis   . Diverticulosis of colon   . Family history of adverse reaction to anesthesia    mother PONV  . GERD (gastroesophageal reflux disease)   . Heart murmur    asymptomatic per pt  . History of adenomatous polyp of colon  11-06-2009  tubullar  . HPV in female   . Hyperlipidemia   . Hypertension   . Hypothyroidism   . Mild aortic valve stenosis    per echo 10-09-2010  ef 60-65%  valva area 1.48cm  . Psoriasis   . Psoriatic  arthritis ([place])    Dr. Estanislado Pandy  . Wears glasses     Family History  Problem Relation Age of Onset  . Arthritis Mother     OA   . Psoriasis Mother   . Other Father     accidental death/MVA at age 49  . Hypertension Brother   . Other Brother     1 brother drowned  . Hypertension Maternal Grandmother   . Diabetes Maternal Grandmother   . Hypertension Maternal Grandfather   . Heart disease Neg Hx   . Cancer Neg Hx    Past Surgical History:  Procedure Laterality Date  . ANTERIOR LAT LUMBAR FUSION  12/25/2011   Procedure: ANTERIOR LATERAL LUMBAR FUSION 1 LEVEL;  Surgeon: Eustace Moore, MD;  Location: [place] [facility];  Service: Neurosurgery;  Laterality: Left;  LUMBAR FOUR-FIVE  . BLADDER SUSPENSION N/A 12/19/2014   Procedure: TRANSVAGINAL TAPE (TVT) PROCEDURE;  Surgeon: Everett Graff, MD;  Location: [facility] [facility];  Service: Gynecology;  Laterality: N/A;  . CARPAL TUNNEL RELEASE Right April 2015  . CERVICAL CONIZATION W/BX N/A 08/28/2014   Procedure: CONIZATION CERVIX WITH BIOPSY;  Surgeon: Ena Dawley, MD;  Location: [facility];  Service: Gynecology;  Laterality: N/A;  . COLONOSCOPY  11-06-2009  . CYSTO N/A 12/19/2014   Procedure: CYSTO;  Surgeon: Everett Graff, MD;  Location: [place];  Service: Gynecology;  Laterality: N/A;  . DENTAL SURGERY    . HYSTEROSCOPY W/D&C  04-04-2008  . LUMBAR PERCUTANEOUS PEDICLE SCREW 1 LEVEL  12/25/2011   Procedure: LUMBAR PERCUTANEOUS PEDICLE SCREW 1 LEVEL;  Surgeon: Eustace Moore, MD;  Location: [place] [facility];  Service: Neurosurgery;  Laterality: Left;  LUMBAR FOUR-FIVE  . PERONEAL NERVE DECOMPRESSION Left 12-27-2009   for neuropathy with  foot drop  . TRANSTHORACIC ECHOCARDIOGRAM  10-09-2010   mild LVH,  ef 60-65%,  grade I diastolic dysfunction,  very mild AV stenosis (area 1.48cm/S/2(VTI) with no regurg./  mild MV calcification without stenosis or regur. /  trivial TR  . VAGINAL HYSTERECTOMY Bilateral 12/19/2014   Procedure: TOTAL VAGINAL  HYSTERECTOMY WITH BILATERAL SALPINGECTOMY;  Surgeon: Ena Dawley, MD;  Location: [facility];  Service: Gynecology;  Laterality: Bilateral;   Social History   Social History Narrative  . No narrative on file     Objective: Vital Signs: BP 126/78   Pulse 60   Resp 14   Ht '5\' 6"'$  (1.676 m)   Wt 132 lb (59.9 kg)   BMI 21.31 kg/m    Physical Exam  Constitutional: She is oriented to person, place, and time. She appears well-developed and well-nourished.  HENT:  Head: Normocephalic and atraumatic.  Eyes: Conjunctivae and EOM are normal.  Neck: Normal range of motion.  Cardiovascular: Normal rate, regular rhythm, normal heart sounds and intact distal pulses.   Pulmonary/Chest: Effort normal and breath sounds normal.  Abdominal: Soft. Bowel sounds are normal.  Lymphadenopathy:    She has no cervical adenopathy.  Neurological: She is alert and oriented to person, place, and time.  Skin: Skin is warm and dry. Capillary refill takes less than 2 seconds.  Psychiatric: She has a normal mood and affect. Her behavior is normal.  Nursing note and vitals reviewed.  Musculoskeletal Exam: C-spine, thoracic, lumbar spine good range of motion. Shoulder joints elbow joints wrist joints MCP joints are good range of motion. She has synovial thickening of all of her PIP and DIP joints. No synovitis was noted. She has left fourth trigger finger with thickening of her left flexor tendon. Hip joints, knee joints, ankles MTPs with good range of motion with no synovitis.  CDAI Exam: CDAI Homunculus Exam:   Tenderness:  Right hand: 2nd DIP, 3rd DIP, 4th DIP and 5th DIP Left hand: 2nd DIP, 3rd DIP, 4th DIP and 5th DIP  Joint Counts:  CDAI Tender Joint count: 0 CDAI Swollen Joint count: 0  Global Assessments:  Patient Global Assessment: 3 Provider Global Assessment: 3  CDAI Calculated Score: 6    Investigation: Findings:  10/23/2015 .  X-ray of the right shoulder joint, 2 views, was within  normal limits without any joint space narrowing today and no chondrocalcinosis was noted.  05/29/2015 After informed consent was obtained, per EULAR recommendations, ultrasound examination of bilateral hands was performed using 12 MHz transducer, grey scale and power Doppler.  Bilateral 2nd MCP joint, bilateral 1st, 2nd and 4th DIP joints, and bilateral wrist joints, both dorsal and volar aspects, were evaluated to look for synovitis or tenosynovitis.  The findings were she has mild hyperemia in her bilateral 2nd MCP joint.  There was no synovitis noted in her DIP joints or wrist joints, although she had severe narrowing of most of her DIP joints with erosive changes.  These findings were consistent with psoriatic arthritis, but not much synovitis.  She had synovial thickening.  Right median nerve was 0.1 cm 2 and the left was 0.09 cm 2, which were within normal limits.   The second study was for a right elbow nodule, which was nontender.  She has noticed this nodule and wanted it to be evaluated.  I used a 12 MHz transducer, grey scale and power Doppler to look at the nodule.  It was a subcutaneous nodule and not related to any tendons or attached to any bony surfaces.  It was partially compressible and fibrous in appearance.  There was no fluid collection in the nodule and there was no hyperemia on Doppler examination.   IMPRESSION AND PLAN:   1.  The patient has a history of psoriatic arthritis, although it is not very active and is fairly well controlled on current medications.  She does have some severe changes and deformities in her hands from longstanding arthritis.  2.  Subcutaneous nodule, which is not vascular.  I have advised her to schedule an appointment with Dr. Allyson Sabal and see if that can be resected.  05/08/2015 X-rays of bilateral hands, 2 views, show bilateral severe DIP and PIP joint space narrowing with erosive changes.  Joint space narrowing bilaterally.  X-rays of bilateral feet, 2  views, show left 1st MTP joint with erosive change and an avulsion fracture.  Bilateral feet have DIP and PIP joint space narrowing.  Bilateral MTPs are otherwise normal and no heel spurs.  08/02/2015 negative TB gold  High risk prescription Remicade 5 mg/kg IV every 6 weeks and methotrexate 0.8 mL subcutaneously  Hospital Outpatient Visit on 12/06/2015  Component Date Value Ref Range Status  . WBC 12/06/2015 7.3  4.0 - 10.5 K/uL Final  . RBC 12/06/2015 3.87  3.87 - 5.11 MIL/uL Final  . Hemoglobin 12/06/2015 12.7  12.0 - 15.0 g/dL Final  . HCT 12/06/2015 37.2  36.0 - 46.0 % Final  .  MCV 12/06/2015 96.1  78.0 - 100.0 fL Final  . MCH 12/06/2015 32.8  26.0 - 34.0 pg Final  . MCHC 12/06/2015 34.1  30.0 - 36.0 g/dL Final  . RDW 12/06/2015 12.9  11.5 - 15.5 % Final  . Platelets 12/06/2015 243  150 - 400 K/uL Final  . Neutrophils Relative % 12/06/2015 62  % Final  . Neutro Abs 12/06/2015 4.6  1.7 - 7.7 K/uL Final  . Lymphocytes Relative 12/06/2015 31  % Final  . Lymphs Abs 12/06/2015 2.3  0.7 - 4.0 K/uL Final  . Monocytes Relative 12/06/2015 6  % Final  . Monocytes Absolute 12/06/2015 0.4  0.1 - 1.0 K/uL Final  . Eosinophils Relative 12/06/2015 1  % Final  . Eosinophils Absolute 12/06/2015 0.1  0.0 - 0.7 K/uL Final  . Basophils Relative 12/06/2015 0  % Final  . Basophils Absolute 12/06/2015 0.0  0.0 - 0.1 K/uL Final  . Sodium 12/06/2015 139  135 - 145 mmol/L Final  . Potassium 12/06/2015 3.9  3.5 - 5.1 mmol/L Final  . Chloride 12/06/2015 108  101 - 111 mmol/L Final  . CO2 12/06/2015 23  22 - 32 mmol/L Final  . Glucose, Bld 12/06/2015 100* 65 - 99 mg/dL Final  . BUN 12/06/2015 10  6 - 20 mg/dL Final  . Creatinine, Ser 12/06/2015 0.83  0.44 - 1.00 mg/dL Final  . Calcium 12/06/2015 9.3  8.9 - 10.3 mg/dL Final  . Total Protein 12/06/2015 6.6  6.5 - 8.1 g/dL Final  . Albumin 12/06/2015 3.7  3.5 - 5.0 g/dL Final  . AST 12/06/2015 20  15 - 41 U/L Final  . ALT 12/06/2015 15  14 - 54 U/L Final    . Alkaline Phosphatase 12/06/2015 59  38 - 126 U/L Final  . Total Bilirubin 12/06/2015 0.8  0.3 - 1.2 mg/dL Final  . GFR calc non Af Amer 12/06/2015 >60  >60 mL/min Final  . GFR calc Af Amer 12/06/2015 >60  >60 mL/min Final   Comment: (NOTE) The eGFR has been calculated using the CKD EPI equation. This calculation has not been validated in all clinical situations. eGFR's persistently <60 mL/min signify possible Chronic Kidney Disease.   . Anion gap 12/06/2015 8  5 - 15 Final     Speciality Comments: No specialty comments available.    Procedures:  Hand/UE Inj Date/Time: 01/08/2016 9:19 AM Performed by: Bo Merino Authorized by: Bo Merino   Consent Given by:  Patient Site marked: the procedure site was marked   Timeout: prior to procedure the correct patient, procedure, and site was verified   Indications:  Therapeutic and tendon swelling Condition: trigger finger   Location:  Ring finger Prep: patient was prepped and draped in usual sterile fashion   Needle Size:  27 G Approach:  Volar Ultrasound Guidance: Yes   Medications:  0.5 mL lidocaine 1 %; 10 mg triamcinolone acetonide 40 MG/ML Aspirate amount (mL):  0 Patient tolerance:  Patient tolerated the procedure well with no immediate complications   Allergies: Codeine; Other; Penicillins; and Gluten meal   Assessment / Plan:     Visit Diagnoses: Psoriatic arthritis ([place]): She continues to have some pain and stiffness but no synovitis. She is doing better on increased dose of methotrexate at 1 mL with Remicade infusions. Her labs are stable.  Other psoriasis: She does not have any active lesions of psoriasis.  Primary osteoarthritis of both hands: She has some DIP thickening and a mucinous cyst I've advised  her to keep it clean and also for right now. Left fourth trigger finger: Different treatment options were discussed after informed consent was obtained an ultrasound guided left fourth trigger finger  and injection was performed. It is described as above she tolerated the procedure well.  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  Cigarette smoker  History of hypertension    Orders: Orders Placed This Encounter  Procedures  . Hand/Upper Extremity Injection/Arthrocentesis   No orders of the defined types were placed in this encounter.   Face-to-face time spent with patient was 30 minutes. 50% of time was spent in counseling and coordination of care.  Follow-Up Instructions: Return in about 4 months (around 05/08/2016) for Psoriatic arthritis.   Bo Merino, MD

## 2016-01-08 ENCOUNTER — Encounter: Payer: Self-pay | Admitting: Rheumatology

## 2016-01-08 ENCOUNTER — Ambulatory Visit (INDEPENDENT_AMBULATORY_CARE_PROVIDER_SITE_OTHER): Payer: BLUE CROSS/BLUE SHIELD | Admitting: Rheumatology

## 2016-01-08 VITALS — BP 126/78 | HR 60 | Resp 14 | Ht 66.0 in | Wt 132.0 lb

## 2016-01-08 DIAGNOSIS — F17210 Nicotine dependence, cigarettes, uncomplicated: Secondary | ICD-10-CM

## 2016-01-08 DIAGNOSIS — L408 Other psoriasis: Secondary | ICD-10-CM | POA: Diagnosis not present

## 2016-01-08 DIAGNOSIS — M19041 Primary osteoarthritis, right hand: Secondary | ICD-10-CM

## 2016-01-08 DIAGNOSIS — Z8679 Personal history of other diseases of the circulatory system: Secondary | ICD-10-CM

## 2016-01-08 DIAGNOSIS — M65342 Trigger finger, left ring finger: Secondary | ICD-10-CM

## 2016-01-08 DIAGNOSIS — L4050 Arthropathic psoriasis, unspecified: Secondary | ICD-10-CM | POA: Diagnosis not present

## 2016-01-08 DIAGNOSIS — Z8639 Personal history of other endocrine, nutritional and metabolic disease: Secondary | ICD-10-CM

## 2016-01-08 DIAGNOSIS — M19042 Primary osteoarthritis, left hand: Secondary | ICD-10-CM

## 2016-01-08 DIAGNOSIS — Z79899 Other long term (current) drug therapy: Secondary | ICD-10-CM

## 2016-01-08 DIAGNOSIS — M47816 Spondylosis without myelopathy or radiculopathy, lumbar region: Secondary | ICD-10-CM

## 2016-01-08 MED ORDER — TRIAMCINOLONE ACETONIDE 40 MG/ML IJ SUSP
10.0000 mg | INTRAMUSCULAR | Status: AC | PRN
  Administered 2016-01-08: 10 mg

## 2016-01-08 MED ORDER — LIDOCAINE HCL 1 % IJ SOLN
0.5000 mL | INTRAMUSCULAR | Status: AC | PRN
  Administered 2016-01-08: .5 mL

## 2016-01-16 ENCOUNTER — Other Ambulatory Visit (HOSPITAL_COMMUNITY): Payer: Self-pay | Admitting: *Deleted

## 2016-01-17 ENCOUNTER — Ambulatory Visit (HOSPITAL_COMMUNITY)
Admission: RE | Admit: 2016-01-17 | Discharge: 2016-01-17 | Disposition: A | Discharge status: Home / Self Care | Payer: BLUE CROSS/BLUE SHIELD | Source: Ambulatory Visit | Attending: Rheumatology | Admitting: Rheumatology

## 2016-01-17 ENCOUNTER — Encounter: Payer: Self-pay | Admitting: Family Medicine

## 2016-01-17 ENCOUNTER — Ambulatory Visit (INDEPENDENT_AMBULATORY_CARE_PROVIDER_SITE_OTHER): Payer: BLUE CROSS/BLUE SHIELD | Admitting: Family Medicine

## 2016-01-17 VITALS — BP 140/82 | HR 86 | Resp 12 | Ht 65.5 in | Wt 129.0 lb

## 2016-01-17 DIAGNOSIS — F17210 Nicotine dependence, cigarettes, uncomplicated: Secondary | ICD-10-CM

## 2016-01-17 DIAGNOSIS — E785 Hyperlipidemia, unspecified: Secondary | ICD-10-CM

## 2016-01-17 DIAGNOSIS — Z23 Encounter for immunization: Secondary | ICD-10-CM

## 2016-01-17 DIAGNOSIS — Z1159 Encounter for screening for other viral diseases: Secondary | ICD-10-CM

## 2016-01-17 DIAGNOSIS — Z86010 Personal history of colonic polyps: Secondary | ICD-10-CM

## 2016-01-17 DIAGNOSIS — E038 Other specified hypothyroidism: Secondary | ICD-10-CM

## 2016-01-17 DIAGNOSIS — K219 Gastro-esophageal reflux disease without esophagitis: Secondary | ICD-10-CM

## 2016-01-17 DIAGNOSIS — L4050 Arthropathic psoriasis, unspecified: Secondary | ICD-10-CM | POA: Diagnosis not present

## 2016-01-17 DIAGNOSIS — M85839 Other specified disorders of bone density and structure, unspecified forearm: Secondary | ICD-10-CM | POA: Diagnosis not present

## 2016-01-17 DIAGNOSIS — I10 Essential (primary) hypertension: Secondary | ICD-10-CM

## 2016-01-17 DIAGNOSIS — Z0000 Encounter for general adult medical examination without abnormal findings: Secondary | ICD-10-CM

## 2016-01-17 LAB — POCT URINALYSIS DIPSTICK
Bilirubin, UA: NEGATIVE
Glucose, UA: NEGATIVE
KETONES UA: NEGATIVE
Leukocytes, UA: NEGATIVE
Nitrite, UA: NEGATIVE
PH UA: 6
PROTEIN UA: NEGATIVE
RBC UA: NEGATIVE
SPEC GRAV UA: 1.02
UROBILINOGEN UA: NEGATIVE

## 2016-01-17 MED ORDER — METOPROLOL SUCCINATE ER 50 MG PO TB24: ORAL_TABLET | ORAL | 3 refills | Status: DC

## 2016-01-17 MED ORDER — SODIUM CHLORIDE 0.9 % IV SOLN
6.0000 mg/kg | Freq: Once | INTRAVENOUS | Status: AC
  Administered 2016-01-17: 400 mg via INTRAVENOUS
  Filled 2016-01-17: qty 40

## 2016-01-17 MED ORDER — SODIUM CHLORIDE 0.9 % IV SOLN
Freq: Once | INTRAVENOUS | Status: AC
  Administered 2016-01-17: 12:00:00 via INTRAVENOUS

## 2016-01-17 MED ORDER — ATORVASTATIN CALCIUM 40 MG PO TABS: 40.0000 mg | ORAL_TABLET | Freq: Every morning | ORAL | 3 refills | Status: DC

## 2016-01-17 NOTE — Progress Notes (Signed)
Subjective:    Patient ID: April Cox, female    DOB: Jun 12, 1955, 60 y.o.   MRN: [id number]  HPI She is here for complete examination. She does have hypertension and continues on her metoprolol. She has also taking Lipitor for her hyperlipidemia. She does have a history of colonic polyps and has a colonoscopy scheduled for February. She is followed by Dr. Kennis Carina for her gynecologic issues. She does have a previous history of CIN III and has had a hysterectomy. She also has hypothyroidism and he apparently has been giving her Synthroid at very low dosing. She says it has helped her not lose any more hair and has actually helped her lose some weight. She plans to have another mammogram probably ordered through Dr. Raphael Gibney. Treated for her psoriatic arthritis and is getting regular infusions as well as blood work. This seems be going fairly well. She does smoke but usually a half a pack per day. She and her husband apparently going to start a smoking cessation program beginning of the year. She has very little difficulty with her reflux symptoms. She also has a history of osteopenia and has had a DEXA scan approximately 2 years ago. She will follow-up with Dr. Raphael Gibney concerning this. Otherwise family and social history as well as health maintenance and immunizations were reviewed.   Review of Systems  All other systems reviewed and are negative.      Objective:   Physical Exam BP 140/82   Pulse 86   Resp 12   Ht 5' 5.5" (1.664 m)   Wt 129 lb (58.5 kg)   SpO2 97%   BMI 21.14 kg/m   General Appearance:    Alert, cooperative, no distress, appears stated age  Head:    Normocephalic, without obvious abnormality, atraumatic  Eyes:    PERRL, conjunctiva/corneas clear, EOM's intact, fundi    benign  Ears:    Normal TM's and external ear canals  Nose:   Nares normal, mucosa normal, no drainage or sinus   tenderness  Throat:   Lips, mucosa, and tongue normal; teeth and gums normal          Lungs:     Clear to auscultation bilaterally without wheezes, rales or     ronchi; respirations unlabored      Heart:    Regular rate and rhythm, S1 and S2 normal, no murmur, rub   or gallop  Breast Exam:    Deferred to GYN  Abdomen:     Soft, non-tender, nondistended, normoactive bowel sounds,    no masses, no hepatosplenomegaly  Genitalia:    Deferred to GYN     Extremities:   No clubbing, cyanosis or edema  Pulses:   2+ and symmetric all extremities  Skin:   Skin color, texture, turgor normal, no rashes or lesions  Lymph nodes:   Cervical, supraclavicular, and axillary nodes normal  Neurologic:   CNII-XII intact, normal strength, sensation and gait; reflexes 2+ and symmetric throughout          Psych:   Normal mood, affect, hygiene and grooming.          Assessment & Plan:  Routine general medical examination at a health care facility - Plan: POCT Urinalysis Dipstick  History of colonic polyps  Essential hypertension - Plan: metoprolol succinate (TOPROL-XL) 50 MG 24 hr tablet  Gastroesophageal reflux disease without esophagitis  Cigarette smoker  Psoriatic arthritis ([place])  Other specified hypothyroidism  Hyperlipidemia, unspecified hyperlipidemia type - Plan:  atorvastatin (LIPITOR) 40 MG tablet  Need for hepatitis C screening test  Need for prophylactic vaccination with combined diphtheria-tetanus-pertussis (DTP) vaccine - Plan: Tdap vaccine greater than or equal to 7yo IM  Need for prophylactic vaccination and inoculation against influenza - Plan: Flu vaccine HIGH DOSE PF (Fluzone High dose)  Osteopenia of forearm, unspecified laterality A prescription was written to get follow-up blood work concerning thyroid as well as lipids and hepatitis C. Also future order for flu and TDaP Also discussed smoking cessation with her in regard to having a contest with her husband with her words and punishments for failure.

## 2016-01-17 NOTE — Patient Instructions (Addendum)
Make a competition out of the smoking cessation Make sure they do hepatitis C and check your lipids as well as TSH

## 2016-01-21 ENCOUNTER — Other Ambulatory Visit (INDEPENDENT_AMBULATORY_CARE_PROVIDER_SITE_OTHER): Payer: BLUE CROSS/BLUE SHIELD

## 2016-01-21 DIAGNOSIS — E785 Hyperlipidemia, unspecified: Secondary | ICD-10-CM

## 2016-01-21 DIAGNOSIS — E039 Hypothyroidism, unspecified: Secondary | ICD-10-CM

## 2016-01-21 DIAGNOSIS — Z1159 Encounter for screening for other viral diseases: Secondary | ICD-10-CM | POA: Diagnosis not present

## 2016-01-21 DIAGNOSIS — Z23 Encounter for immunization: Secondary | ICD-10-CM | POA: Diagnosis not present

## 2016-01-21 LAB — HEPATITIS C ANTIBODY: HCV AB: NEGATIVE

## 2016-01-22 LAB — LIPID PANEL
CHOL/HDL RATIO: 4.3 ratio (ref <5.0)
Cholesterol: 227 mg/dL — ABNORMAL HIGH (ref <200)
HDL: 53 mg/dL (ref >50)
LDL CALC: 144 mg/dL — AB (ref <100)
TRIGLYCERIDES: 152 mg/dL — AB (ref <150)
VLDL: 30 mg/dL (ref <30)

## 2016-01-22 LAB — TSH: TSH: 2.6 mIU/L

## 2016-01-28 ENCOUNTER — Other Ambulatory Visit: Payer: Self-pay | Admitting: Rheumatology

## 2016-01-28 NOTE — Telephone Encounter (Signed)
Last Visit: 01/08/16 Next Visit: 05/07/16 Labs: 12/06/15 WNL  Okay to refill Folic Acid?

## 2016-01-28 NOTE — Telephone Encounter (Signed)
ok 

## 2016-01-28 NOTE — Telephone Encounter (Signed)
Last Visit: 01/08/16 Next Visit: 05/07/16 Labs: 12/06/15 WNL  Okay to refill MTX?

## 2016-02-14 ENCOUNTER — Other Ambulatory Visit: Payer: Self-pay | Admitting: Radiology

## 2016-02-14 DIAGNOSIS — L4050 Arthropathic psoriasis, unspecified: Secondary | ICD-10-CM

## 2016-02-28 ENCOUNTER — Ambulatory Visit (HOSPITAL_COMMUNITY)
Admission: RE | Admit: 2016-02-28 | Discharge: 2016-02-28 | Disposition: A | Discharge status: Home / Self Care | Payer: BLUE CROSS/BLUE SHIELD | Source: Ambulatory Visit | Attending: Rheumatology | Admitting: Rheumatology

## 2016-02-28 DIAGNOSIS — L4050 Arthropathic psoriasis, unspecified: Secondary | ICD-10-CM | POA: Diagnosis not present

## 2016-02-28 LAB — CBC WITH DIFFERENTIAL/PLATELET
BASOS ABS: 0 10*3/uL (ref 0.0–0.1)
Basophils Relative: 0 %
Eosinophils Absolute: 0.1 10*3/uL (ref 0.0–0.7)
Eosinophils Relative: 1 %
HEMATOCRIT: 38.7 % (ref 36.0–46.0)
HEMOGLOBIN: 13.2 g/dL (ref 12.0–15.0)
LYMPHS PCT: 27 %
Lymphs Abs: 2.7 10*3/uL (ref 0.7–4.0)
MCH: 33.6 pg (ref 26.0–34.0)
MCHC: 34.1 g/dL (ref 30.0–36.0)
MCV: 98.5 fL (ref 78.0–100.0)
MONO ABS: 0.5 10*3/uL (ref 0.1–1.0)
Monocytes Relative: 5 %
NEUTROS ABS: 6.7 10*3/uL (ref 1.7–7.7)
NEUTROS PCT: 67 %
Platelets: 314 10*3/uL (ref 150–400)
RBC: 3.93 MIL/uL (ref 3.87–5.11)
RDW: 13.1 % (ref 11.5–15.5)
WBC: 9.9 10*3/uL (ref 4.0–10.5)

## 2016-02-28 LAB — COMPREHENSIVE METABOLIC PANEL
ALBUMIN: 4 g/dL (ref 3.5–5.0)
ALK PHOS: 90 U/L (ref 38–126)
ALT: 17 U/L (ref 14–54)
AST: 21 U/L (ref 15–41)
Anion gap: 7 (ref 5–15)
BILIRUBIN TOTAL: 0.7 mg/dL (ref 0.3–1.2)
BUN: 5 mg/dL — AB (ref 6–20)
CALCIUM: 9.9 mg/dL (ref 8.9–10.3)
CO2: 28 mmol/L (ref 22–32)
CREATININE: 0.61 mg/dL (ref 0.44–1.00)
Chloride: 103 mmol/L (ref 101–111)
GFR calc Af Amer: >60 mL/min (ref >60)
GLUCOSE: 99 mg/dL (ref 65–99)
POTASSIUM: 3.5 mmol/L (ref 3.5–5.1)
Sodium: 138 mmol/L (ref 135–145)
TOTAL PROTEIN: 7.8 g/dL (ref 6.5–8.1)

## 2016-02-28 MED ORDER — ACETAMINOPHEN 325 MG PO TABS: 650.0000 mg | ORAL_TABLET | ORAL | Status: DC

## 2016-02-28 MED ORDER — DIPHENHYDRAMINE HCL 25 MG PO CAPS: 25.0000 mg | ORAL_CAPSULE | ORAL | Status: DC

## 2016-02-28 MED ORDER — SODIUM CHLORIDE 0.9 % IV SOLN
6.0000 mg/kg | INTRAVENOUS | Status: DC
  Administered 2016-02-28: 300 mg via INTRAVENOUS
  Filled 2016-02-28: qty 30

## 2016-04-03 ENCOUNTER — Other Ambulatory Visit: Payer: Self-pay | Admitting: Radiology

## 2016-04-03 NOTE — Progress Notes (Signed)
TB due in July  Orders reviewed/ in epic ready for infusion

## 2016-04-09 ENCOUNTER — Telehealth: Payer: Self-pay | Admitting: Family Medicine

## 2016-04-09 ENCOUNTER — Other Ambulatory Visit: Payer: Self-pay | Admitting: Rheumatology

## 2016-04-09 MED ORDER — EPINEPHRINE 0.3 MG/0.3ML IJ SOAJ: INTRAMUSCULAR | 2 refills | Status: DC

## 2016-04-09 NOTE — Telephone Encounter (Signed)
Last Visit: 01/08/16 Next Visit: 05/07/16 Labs: 02/28/16 WNL  Okay to refill MTX and Syringes?

## 2016-04-09 NOTE — Telephone Encounter (Signed)
ok 

## 2016-04-09 NOTE — Telephone Encounter (Signed)
Epipen has expired. Requesting a new script.

## 2016-04-10 ENCOUNTER — Ambulatory Visit (HOSPITAL_COMMUNITY)
Admission: RE | Admit: 2016-04-10 | Discharge: 2016-04-10 | Disposition: A | Discharge status: Home / Self Care | Payer: BLUE CROSS/BLUE SHIELD | Source: Ambulatory Visit | Attending: Rheumatology | Admitting: Rheumatology

## 2016-04-10 ENCOUNTER — Telehealth: Payer: Self-pay | Admitting: Radiology

## 2016-04-10 DIAGNOSIS — L4050 Arthropathic psoriasis, unspecified: Secondary | ICD-10-CM | POA: Diagnosis not present

## 2016-04-10 LAB — COMPREHENSIVE METABOLIC PANEL
ALT: 16 U/L (ref 14–54)
AST: 21 U/L (ref 15–41)
Albumin: 3.8 g/dL (ref 3.5–5.0)
Alkaline Phosphatase: 77 U/L (ref 38–126)
Anion gap: 8 (ref 5–15)
BILIRUBIN TOTAL: 0.7 mg/dL (ref 0.3–1.2)
CO2: 26 mmol/L (ref 22–32)
CREATININE: 0.59 mg/dL (ref 0.44–1.00)
Calcium: 9.5 mg/dL (ref 8.9–10.3)
Chloride: 103 mmol/L (ref 101–111)
Glucose, Bld: 99 mg/dL (ref 65–99)
POTASSIUM: 3.5 mmol/L (ref 3.5–5.1)
Sodium: 137 mmol/L (ref 135–145)
TOTAL PROTEIN: 7.5 g/dL (ref 6.5–8.1)

## 2016-04-10 LAB — CBC WITH DIFFERENTIAL/PLATELET
BASOS ABS: 0 10*3/uL (ref 0.0–0.1)
Basophils Relative: 0 %
Eosinophils Absolute: 0.1 10*3/uL (ref 0.0–0.7)
Eosinophils Relative: 1 %
HEMATOCRIT: 39.3 % (ref 36.0–46.0)
Hemoglobin: 13.2 g/dL (ref 12.0–15.0)
LYMPHS ABS: 2.6 10*3/uL (ref 0.7–4.0)
LYMPHS PCT: 34 %
MCH: 33 pg (ref 26.0–34.0)
MCHC: 33.6 g/dL (ref 30.0–36.0)
MCV: 98.3 fL (ref 78.0–100.0)
MONO ABS: 0.4 10*3/uL (ref 0.1–1.0)
Monocytes Relative: 5 %
NEUTROS ABS: 4.6 10*3/uL (ref 1.7–7.7)
Neutrophils Relative %: 60 %
Platelets: 291 10*3/uL (ref 150–400)
RBC: 4 MIL/uL (ref 3.87–5.11)
RDW: 12.8 % (ref 11.5–15.5)
WBC: 7.7 10*3/uL (ref 4.0–10.5)

## 2016-04-10 MED ORDER — ACETAMINOPHEN 325 MG PO TABS: 650.0000 mg | ORAL_TABLET | ORAL | Status: DC

## 2016-04-10 MED ORDER — SODIUM CHLORIDE 0.9 % IV SOLN
6.0000 mg/kg | INTRAVENOUS | Status: DC
  Administered 2016-04-10: 400 mg via INTRAVENOUS
  Filled 2016-04-10: qty 40

## 2016-04-10 MED ORDER — DIPHENHYDRAMINE HCL 25 MG PO CAPS: 25.0000 mg | ORAL_CAPSULE | ORAL | Status: DC

## 2016-04-10 NOTE — Progress Notes (Signed)
WNL

## 2016-04-10 NOTE — Telephone Encounter (Signed)
I have called patient to advise labs are normal  

## 2016-04-10 NOTE — Progress Notes (Signed)
CBC normal

## 2016-04-11 ENCOUNTER — Other Ambulatory Visit: Payer: Self-pay | Admitting: Family Medicine

## 2016-04-15 ENCOUNTER — Other Ambulatory Visit (HOSPITAL_COMMUNITY): Payer: Self-pay | Admitting: *Deleted

## 2016-04-18 ENCOUNTER — Emergency Department (HOSPITAL_COMMUNITY)
Admission: EM | Admit: 2016-04-18 | Discharge: 2016-04-18 | Disposition: A | Discharge status: Home / Self Care | Payer: BLUE CROSS/BLUE SHIELD | Attending: Emergency Medicine | Admitting: Emergency Medicine

## 2016-04-18 ENCOUNTER — Emergency Department (HOSPITAL_COMMUNITY): Payer: BLUE CROSS/BLUE SHIELD

## 2016-04-18 ENCOUNTER — Encounter (HOSPITAL_COMMUNITY): Payer: Self-pay | Admitting: Emergency Medicine

## 2016-04-18 DIAGNOSIS — S92534A Nondisplaced fracture of distal phalanx of right lesser toe(s), initial encounter for closed fracture: Secondary | ICD-10-CM

## 2016-04-18 DIAGNOSIS — Z79899 Other long term (current) drug therapy: Secondary | ICD-10-CM | POA: Insufficient documentation

## 2016-04-18 DIAGNOSIS — S99921A Unspecified injury of right foot, initial encounter: Secondary | ICD-10-CM | POA: Diagnosis not present

## 2016-04-18 DIAGNOSIS — Y929 Unspecified place or not applicable: Secondary | ICD-10-CM | POA: Diagnosis not present

## 2016-04-18 DIAGNOSIS — Y999 Unspecified external cause status: Secondary | ICD-10-CM | POA: Diagnosis not present

## 2016-04-18 DIAGNOSIS — Y939 Activity, unspecified: Secondary | ICD-10-CM | POA: Insufficient documentation

## 2016-04-18 DIAGNOSIS — M7989 Other specified soft tissue disorders: Secondary | ICD-10-CM | POA: Diagnosis not present

## 2016-04-18 DIAGNOSIS — W208XXA Other cause of strike by thrown, projected or falling object, initial encounter: Secondary | ICD-10-CM | POA: Insufficient documentation

## 2016-04-18 DIAGNOSIS — E039 Hypothyroidism, unspecified: Secondary | ICD-10-CM | POA: Insufficient documentation

## 2016-04-18 DIAGNOSIS — I10 Essential (primary) hypertension: Secondary | ICD-10-CM | POA: Diagnosis not present

## 2016-04-18 DIAGNOSIS — F17210 Nicotine dependence, cigarettes, uncomplicated: Secondary | ICD-10-CM | POA: Insufficient documentation

## 2016-04-18 NOTE — Discharge Instructions (Signed)
1. Medications: take ibuprofen as needed for pain, usual home medications 2. Treatment: rest, ice, elevate and use brace, drink plenty of fluids, gentle stretching 3. Follow Up: Please followup with orthopedics as directed or your PCP in 1 week if no improvement for discussion of your diagnoses and further evaluation after today's visit; if you do not have a primary care doctor use the resource guide provided to find one; Please return to the ER for worsening symptoms or other concerns

## 2016-04-18 NOTE — ED Provider Notes (Signed)
[facility] DEPT Provider Note   CSN: [id number] Arrival date & time: 04/18/16  2135   By signing my name below, I, Delton Prairie, attest that this documentation has been prepared under the direction and in the presence of  [employer], PA-C. Electronically Signed: Delton Prairie, ED Scribe. 04/18/16. 11:13 PM.   History   Chief Complaint Chief Complaint  Patient presents with  . Toe Injury    HPI Comments:  April Cox is a 61 y.o. female who presents to the Emergency Department complaining of acute onset, moderate right third toe pain s/p an incident which occurred around 10:30 AM yesterday. Pt states she dropped a faucet fixture on her right foot causing the injury. Her pain is worse upon palpation. She reports associated swelling and bruising. No alleviating factors noted. Pt denies a hx of kidney problems and a hx of GI bleeds. Pt also denies numbness or any other associated symptoms. No other complaints noted.   The history is provided by the patient and medical records. No language interpreter was used.    Past Medical History:  Diagnosis Date  . Abnormal Pap smear of cervix   . Anemia    history of anemia  . Arthritis   . Diverticulosis of colon   . Family history of adverse reaction to anesthesia    mother PONV  . GERD (gastroesophageal reflux disease)   . Heart murmur    asymptomatic per pt  . History of adenomatous polyp of colon    11-06-2009  tubullar  . HPV in female   . Hyperlipidemia   . Hypertension   . Hypothyroidism   . Mild aortic valve stenosis    per echo 10-09-2010  ef 60-65%  valva area 1.48cm  . Psoriasis   . Psoriatic arthritis ([place])    Dr. Estanislado Pandy  . Wears glasses     Patient Active Problem List   Diagnosis Date Noted  . Other psoriasis 01/07/2016  . Primary osteoarthritis of both hands 01/07/2016  . Spondylosis of lumbar region without myelopathy or radiculopathy 01/07/2016  . High risk medication use 01/07/2016  .  Essential hypertension 09/10/2014  . History of HPV infection 09/10/2014  . History of colonic polyps 09/10/2014  . Osteopenia 09/10/2014  . Gastroesophageal reflux disease without esophagitis 09/10/2014  . Cigarette smoker 05/11/2011  . Hyperlipidemia 08/22/2010  . Hypothyroidism 08/22/2010  . Psoriatic arthritis ([place]) 08/22/2010    Past Surgical History:  Procedure Laterality Date  . ABDOMINAL HYSTERECTOMY    . ANTERIOR LAT LUMBAR FUSION  12/25/2011   Procedure: ANTERIOR LATERAL LUMBAR FUSION 1 LEVEL;  Surgeon: Eustace Moore, MD;  Location: [facility];  Service: Neurosurgery;  Laterality: Left;  LUMBAR FOUR-FIVE  . BLADDER SUSPENSION N/A 12/19/2014   Procedure: TRANSVAGINAL TAPE (TVT) PROCEDURE;  Surgeon: Everett Graff, MD;  Location: [facility];  Service: Gynecology;  Laterality: N/A;  . CARPAL TUNNEL RELEASE Right April 2015  . CERVICAL CONIZATION W/BX N/A 08/28/2014   Procedure: CONIZATION CERVIX WITH BIOPSY;  Surgeon: Ena Dawley, MD;  Location: [facility];  Service: Gynecology;  Laterality: N/A;  . COLONOSCOPY  11-06-2009  . CYSTO N/A 12/19/2014   Procedure: CYSTO;  Surgeon: Everett Graff, MD;  Location: [facility];  Service: Gynecology;  Laterality: N/A;  . DENTAL SURGERY    . HYSTEROSCOPY W/D&C  04-04-2008  . LUMBAR PERCUTANEOUS PEDICLE SCREW 1 LEVEL  12/25/2011   Procedure: LUMBAR PERCUTANEOUS PEDICLE SCREW 1 LEVEL;  Surgeon: Eustace Moore, MD;  Location:  [facility];  Service: Neurosurgery;  Laterality: Left;  LUMBAR FOUR-FIVE  . PERONEAL NERVE DECOMPRESSION Left 12-27-2009   for neuropathy with  foot drop  . TRANSTHORACIC ECHOCARDIOGRAM  10-09-2010   mild LVH,  ef 60-65%,  grade I diastolic dysfunction,  very mild AV stenosis (area 1.48cm/S/2(VTI) with no regurg./  mild MV calcification without stenosis or regur. /  trivial TR  . VAGINAL HYSTERECTOMY Bilateral 12/19/2014   Procedure: TOTAL VAGINAL HYSTERECTOMY WITH BILATERAL SALPINGECTOMY;  Surgeon: Ena Dawley, MD;  Location: [facility];  Service: Gynecology;  Laterality: Bilateral;    OB History    No data available       Home Medications    Prior to Admission medications   Medication Sig Start Date End Date Taking? Authorizing Provider  atorvastatin (LIPITOR) 40 MG tablet Take 1 tablet (40 mg total) by mouth every morning. 01/17/16   Denita Lung, MD  calcium-vitamin D (OSCAL WITH D) 500-200 MG-UNIT per tablet Take 1 tablet by mouth 2 (two) times daily.      Historical Provider, MD  Cholecalciferol (VITAMIN D PO) Take 500 tablets by mouth daily.     Historical Provider, MD  clobetasol ointment (TEMOVATE) 5.63 % Apply 1 application topically as needed (psiorisis).  09/11/14   Historical Provider, MD  EPINEPHrine (EPIPEN 2-PAK) 0.3 mg/0.3 mL IJ SOAJ injection INJECT 0.3 MLS (0.3 MG TOTAL) INTO THE MUSCLE ONCE. 04/09/16   Denita Lung, MD  fish oil-omega-3 fatty acids 1000 MG capsule Take 1 g by mouth daily.     Historical Provider, MD  folic acid (FOLVITE) 1 MG tablet TAKE 2 TABLETS BY MOUTH DAILY 01/28/16   Bo Merino, MD  inFLIXimab (REMICADE) 100 MG injection Inject 6 mg/kg into the vein every 6 (six) weeks. Infuse 400mg  (4 vials) in 266mL NS over 2 hours every 6 weeks    Bo Merino, MD  levothyroxine (SYNTHROID) 25 MCG tablet Take 1 tablet (25 mcg total) by mouth daily before breakfast. 09/10/14   Denita Lung, MD  MAGNESIUM PO Take 20 mg by mouth 2 (two) times daily.     Historical Provider, MD  methotrexate 250 MG/10ML injection INJECT 0.8ML ONCE WEEKLY 04/10/16   Bo Merino, MD  Methotrexate, Anti-Rheumatic, (METHOTREXATE, PF, [place]) Inject 1 mg into the skin once a week. Saturday's     Historical Provider, MD  metoprolol succinate (TOPROL-XL) 50 MG 24 hr tablet TAKE ONE TABLET BY MOUTH DAILY WITH OR IMMEDIATELY FOLLOWING A MEAL 01/17/16   Denita Lung, MD  MONOJECT 1CC TB SYR 27GX1/2" 27G X 1/2" 1 ML MISC AS DIRECTED 04/10/16   Bo Merino, MD  naproxen sodium  (ANAPROX) 220 MG tablet Take 220 mg by mouth 2 (two) times daily as needed (dental pain).    Historical Provider, MD  OVER THE COUNTER MEDICATION Take 1,000 mg by mouth daily. Magnesium maleate    Historical Provider, MD  pantoprazole (PROTONIX) 40 MG tablet TAKE 1 TABLET BY MOUTH DAILY 04/13/16   Denita Lung, MD  vitamin C (ASCORBIC ACID) 250 MG tablet Take 250 mg by mouth daily.      Historical Provider, MD    Family History Family History  Problem Relation Age of Onset  . Arthritis Mother     OA   . Psoriasis Mother   . Other Father     accidental death/MVA at age 40  . Hypertension Brother   . Other Brother     1 brother drowned  .  Hypertension Maternal Grandmother   . Diabetes Maternal Grandmother   . Hypertension Maternal Grandfather   . Heart disease Neg Hx   . Cancer Neg Hx     Social History Social History  Substance Use Topics  . Smoking status: Current Some Day Smoker    Packs/day: 1.00    Years: 0.00    Types: Cigarettes  . Smokeless tobacco: Never Used  . Alcohol use 3.5 oz/week    7 Standard drinks or equivalent per week     Comment: OCCASIONAL     Allergies   Codeine; Other; Penicillins; and Gluten meal   Review of Systems Review of Systems  Musculoskeletal: Positive for arthralgias and myalgias.  Skin: Positive for color change.  Neurological: Negative for numbness.   Physical Exam Updated Vital Signs BP 140/84 (BP Location: Left Arm)   Pulse 77   Temp 97.5 F (36.4 C) (Oral)   Resp 12   Ht 5\' 6"  (1.676 m)   Wt 130 lb (59 kg)   SpO2 96%   BMI 20.98 kg/m   Physical Exam  Constitutional: She appears well-developed and well-nourished. No distress.  HENT:  Head: Normocephalic and atraumatic.  Eyes: Conjunctivae are normal.  Neck: Normal range of motion.  Cardiovascular: Normal rate, regular rhythm and intact distal pulses.   Capillary refill < 3 sec  Pulmonary/Chest: Effort normal and breath sounds normal.  Musculoskeletal: She  exhibits tenderness. She exhibits no edema.  Right 3rd toe with significant swelling and ecchymosis. Blood blister to the distal medial portion or the toe. Decreased ROM due to pain. No open wounds   Neurological: She is alert. Coordination normal.  Sensation intact   Skin: Skin is warm and dry. She is not diaphoretic.  No tenting of the skin  Psychiatric: She has a normal mood and affect.  Nursing note and vitals reviewed.    ED Treatments / Results  DIAGNOSTIC STUDIES:  Oxygen Saturation is 96% on RA, normal by my interpretation.    COORDINATION OF CARE:  11:03 PM Discussed treatment plan with pt at bedside and pt agreed to plan.   Radiology Dg Foot Complete Right  Result Date: 04/18/2016 CLINICAL DATA:  Dropped faucet on third toe yesterday, swelling and blistering. EXAM: RIGHT FOOT COMPLETE - 3+ VIEW COMPARISON:  RIGHT foot radiograph July 29, 2015 FINDINGS: Third toe obscured on lateral radiograph. Acute suspected nondisplaced tuft fracture third distal phalanx without intra-articular extension. No dislocation. No destructive bony lesions. Mild midfoot osteoarthrosis. Third digit soft tissue swelling without subcutaneous gas or radiopaque foreign bodies. IMPRESSION: Suspected nondisplaced acute third distal phalanx tuft fracture, which would be better characterized on toe radiographs as clinically indicated. Electronically Signed   By: Elon Alas M.D.   On: 04/18/2016 22:35    Procedures .Splint Application Date/Time: 04/27/8117 11:17 PM Performed by: Abigail Butts Authorized by: Abigail Butts   Consent:    Consent obtained:  Verbal   Consent given by:  Patient   Risks discussed:  Discoloration, numbness, pain and swelling   Alternatives discussed:  No treatment Pre-procedure details:    Sensation:  Normal   Skin color:  Ecchymotic Procedure details:    Laterality:  Right   Location:  Toe   Toe:  R third toe   Strapping: no     Splint type:  CAM  walker boot Post-procedure details:    Pain:  Unchanged   Sensation:  Normal   Skin color:  Ecchymosis unchanged   Patient tolerance of procedure:  Tolerated well, no immediate complications   (including critical care time)  Medications Ordered in ED Medications - No data to display   Initial Impression / Assessment and Plan / ED Course  I have reviewed the triage vital signs and the nursing notes.  Pertinent labs & imaging results that were available during my care of the patient were reviewed by me and considered in my medical decision making (see chart for details).     Patient X-Ray positive for obvious tuft fracture. Patient given CAM walker boot at her request as opposed to post op splint while in ED, conservative therapy recommended and discussed. Patient will be discharged home & is agreeable with above plan. Returns precautions discussed including infection. Pt appears safe for discharge.  Final Clinical Impressions(s) / ED Diagnoses   Final diagnoses:  None    New Prescriptions New Prescriptions   No medications on file    I personally performed the services described in this documentation, which was scribed in my presence. The recorded information has been reviewed and is accurate.     Jarrett Soho [place], PA-C 04/18/16 [place], MD 04/20/16 [phone]

## 2016-04-18 NOTE — ED Triage Notes (Signed)
Pt reports dropping a faucet fixture on right 3rd toe yesterday. Pt now having swelling and blistering to toe around nail bed. Pt reports pain radiates down bottom of foot and to ankle.

## 2016-04-18 NOTE — ED Notes (Signed)
PT DISCHARGED. INSTRUCTIONS GIVEN. AAOX4. PT IN NO APPARENT DISTRESS. THE OPPORTUNITY TO ASK QUESTIONS WAS PROVIDED. 

## 2016-04-28 NOTE — Progress Notes (Signed)
Office Visit Note  Patient: April Cox             Date of Birth: 1955/08/04           MRN: [id number]             PCP: Wyatt Haste, MD Referring: Denita Lung, MD Visit Date: 05/07/2016 Occupation: '@GUAROCC' @    Subjective:  Pain hands and feet   History of Present Illness: April Cox is a 61 y.o. female  history of psoriatic arthritis and psoriasis. She continues to have some discomfort in her hands and feet. She had recent ultrasound of her bilateral hands with did not show any synovitis although she had synovial thickening. Is on  IV Remicade and methotrexate which she's been tolerating well.  Activities of Daily Living:  Patient reports morning stiffness for 30 minutes.   Patient Denies nocturnal pain.  Difficulty dressing/grooming: Denies Difficulty climbing stairs: Denies Difficulty getting out of chair: Denies Difficulty using hands for taps, buttons, cutlery, and/or writing: Reports   Review of Systems  Constitutional: Positive for fatigue. Negative for night sweats, weight gain, weight loss and weakness.  HENT: Positive for mouth dryness. Negative for mouth sores, trouble swallowing, trouble swallowing and nose dryness.   Eyes: Negative for pain, redness, visual disturbance and dryness.  Respiratory: Negative for cough, shortness of breath and difficulty breathing.   Cardiovascular: Positive for hypertension. Negative for chest pain, palpitations, irregular heartbeat and swelling in legs/feet.  Gastrointestinal: Negative for blood in stool, constipation and diarrhea.  Endocrine: Negative for increased urination.  Genitourinary: Negative for vaginal dryness.  Musculoskeletal: Positive for arthralgias, joint pain and morning stiffness. Negative for joint swelling, myalgias, muscle weakness, muscle tenderness and myalgias.  Skin: Negative for color change, rash, hair loss, skin tightness, ulcers and sensitivity to sunlight.  Allergic/Immunologic:  Negative for susceptible to infections.  Neurological: Negative for dizziness, memory loss and night sweats.  Hematological: Negative for swollen glands.  Psychiatric/Behavioral: Negative for depressed mood and sleep disturbance. The patient is not nervous/anxious.     PMFS History:  Patient Active Problem List   Diagnosis Date Noted  . Other psoriasis 01/07/2016  . Primary osteoarthritis of both hands 01/07/2016  . Spondylosis of lumbar region without myelopathy or radiculopathy 01/07/2016  . High risk medication use 01/07/2016  . Essential hypertension 09/10/2014  . History of HPV infection 09/10/2014  . History of colonic polyps 09/10/2014  . Osteopenia 09/10/2014  . Gastroesophageal reflux disease without esophagitis 09/10/2014  . Cigarette smoker 05/11/2011  . Hyperlipidemia 08/22/2010  . Hypothyroidism 08/22/2010  . Psoriatic arthritis ([place]) 08/22/2010    Past Medical History:  Diagnosis Date  . Abnormal Pap smear of cervix   . Anemia    history of anemia  . Arthritis   . Diverticulosis of colon   . Family history of adverse reaction to anesthesia    mother PONV  . GERD (gastroesophageal reflux disease)   . Heart murmur    asymptomatic per pt  . History of adenomatous polyp of colon    11-06-2009  tubullar  . HPV in female   . Hyperlipidemia   . Hypertension   . Hypothyroidism   . Mild aortic valve stenosis    per echo 10-09-2010  ef 60-65%  valva area 1.48cm  . Psoriasis   . Psoriatic arthritis ([place])    Dr. Estanislado Pandy  . Wears glasses     Family History  Problem Relation Age of Onset  . Arthritis Mother  OA   . Psoriasis Mother   . Other Father     accidental death/MVA at age 29  . Hypertension Brother   . Other Brother     1 brother drowned  . Hypertension Maternal Grandmother   . Diabetes Maternal Grandmother   . Hypertension Maternal Grandfather   . Heart disease Neg Hx   . Cancer Neg Hx    Past Surgical History:  Procedure Laterality Date    . ABDOMINAL HYSTERECTOMY    . ANTERIOR LAT LUMBAR FUSION  12/25/2011   Procedure: ANTERIOR LATERAL LUMBAR FUSION 1 LEVEL;  Surgeon: Eustace Moore, MD;  Location: [facility];  Service: Neurosurgery;  Laterality: Left;  LUMBAR FOUR-FIVE  . BLADDER SUSPENSION N/A 12/19/2014   Procedure: TRANSVAGINAL TAPE (TVT) PROCEDURE;  Surgeon: Everett Graff, MD;  Location: [facility];  Service: Gynecology;  Laterality: N/A;  . CARPAL TUNNEL RELEASE Right April 2015  . CERVICAL CONIZATION W/BX N/A 08/28/2014   Procedure: CONIZATION CERVIX WITH BIOPSY;  Surgeon: Ena Dawley, MD;  Location: [facility];  Service: Gynecology;  Laterality: N/A;  . COLONOSCOPY  11-06-2009  . CYSTO N/A 12/19/2014   Procedure: CYSTO;  Surgeon: Everett Graff, MD;  Location: [facility];  Service: Gynecology;  Laterality: N/A;  . DENTAL SURGERY    . HYSTEROSCOPY W/D&C  04-04-2008  . LUMBAR PERCUTANEOUS PEDICLE SCREW 1 LEVEL  12/25/2011   Procedure: LUMBAR PERCUTANEOUS PEDICLE SCREW 1 LEVEL;  Surgeon: Eustace Moore, MD;  Location: [facility];  Service: Neurosurgery;  Laterality: Left;  LUMBAR FOUR-FIVE  . PERONEAL NERVE DECOMPRESSION Left 12-27-2009   for neuropathy with  foot drop  . TRANSTHORACIC ECHOCARDIOGRAM  10-09-2010   mild LVH,  ef 60-65%,  grade I diastolic dysfunction,  very mild AV stenosis (area 1.48cm/S/2(VTI) with no regurg./  mild MV calcification without stenosis or regur. /  trivial TR  . VAGINAL HYSTERECTOMY Bilateral 12/19/2014   Procedure: TOTAL VAGINAL HYSTERECTOMY WITH BILATERAL SALPINGECTOMY;  Surgeon: Ena Dawley, MD;  Location: [facility];  Service: Gynecology;  Laterality: Bilateral;   Social History   Social History Narrative  . No narrative on file     Objective: Vital Signs: BP 118/70   Pulse 78   Resp 14   Wt 132 lb (59.9 kg)   BMI 21.31 kg/m    Physical Exam  Constitutional: She is oriented to person, place, and time. She appears well-developed and well-nourished.  HENT:   Head: Normocephalic and atraumatic.  Eyes: Conjunctivae and EOM are normal.  Neck: Normal range of motion.  Cardiovascular: Normal rate, regular rhythm, normal heart sounds and intact distal pulses.   Pulmonary/Chest: Effort normal and breath sounds normal.  Abdominal: Soft. Bowel sounds are normal.  Lymphadenopathy:    She has no cervical adenopathy.  Neurological: She is alert and oriented to person, place, and time.  Skin: Skin is warm and dry. Capillary refill takes less than 2 seconds.  Psychiatric: She has a normal mood and affect. Her behavior is normal.  Nursing note and vitals reviewed.    Musculoskeletal Exam: C-spine and thoracic lumbar spine good range of motion she had no SI joint tenderness. She is good range of motion of her shoulders elbow joints. She had no synovitis of her wrist joints. She synovial thickening over several of her PIP/DIP joints. Hip joints knee joints are good range of motion. She had her right third toe fracture for that her she's in a boot.  CDAI Exam: CDAI Homunculus Exam:  Swelling:  Right hand: 3rd PIP Left hand: 2nd MCP and 3rd MCP  Joint Counts:  CDAI Tender Joint count: 0 CDAI Swollen Joint count: 3  Global Assessments:  Patient Global Assessment: 5 Provider Global Assessment: 3  CDAI Calculated Score: 11    Investigation: No additional findings. Hospital Outpatient Visit on 04/10/2016  Component Date Value Ref Range Status  . WBC 04/10/2016 7.7  4.0 - 10.5 K/uL Final  . RBC 04/10/2016 4.00  3.87 - 5.11 MIL/uL Final  . Hemoglobin 04/10/2016 13.2  12.0 - 15.0 g/dL Final  . HCT 04/10/2016 39.3  36.0 - 46.0 % Final  . MCV 04/10/2016 98.3  78.0 - 100.0 fL Final  . MCH 04/10/2016 33.0  26.0 - 34.0 pg Final  . MCHC 04/10/2016 33.6  30.0 - 36.0 g/dL Final  . RDW 04/10/2016 12.8  11.5 - 15.5 % Final  . Platelets 04/10/2016 291  150 - 400 K/uL Final  . Neutrophils Relative % 04/10/2016 60  % Final  . Neutro Abs 04/10/2016 4.6  1.7  - 7.7 K/uL Final  . Lymphocytes Relative 04/10/2016 34  % Final  . Lymphs Abs 04/10/2016 2.6  0.7 - 4.0 K/uL Final  . Monocytes Relative 04/10/2016 5  % Final  . Monocytes Absolute 04/10/2016 0.4  0.1 - 1.0 K/uL Final  . Eosinophils Relative 04/10/2016 1  % Final  . Eosinophils Absolute 04/10/2016 0.1  0.0 - 0.7 K/uL Final  . Basophils Relative 04/10/2016 0  % Final  . Basophils Absolute 04/10/2016 0.0  0.0 - 0.1 K/uL Final  . Sodium 04/10/2016 137  135 - 145 mmol/L Final  . Potassium 04/10/2016 3.5  3.5 - 5.1 mmol/L Final  . Chloride 04/10/2016 103  101 - 111 mmol/L Final  . CO2 04/10/2016 26  22 - 32 mmol/L Final  . Glucose, Bld 04/10/2016 99  65 - 99 mg/dL Final  . BUN 04/10/2016 <5* 6 - 20 mg/dL Final  . Creatinine, Ser 04/10/2016 0.59  0.44 - 1.00 mg/dL Final  . Calcium 04/10/2016 9.5  8.9 - 10.3 mg/dL Final  . Total Protein 04/10/2016 7.5  6.5 - 8.1 g/dL Final  . Albumin 04/10/2016 3.8  3.5 - 5.0 g/dL Final  . AST 04/10/2016 21  15 - 41 U/L Final  . ALT 04/10/2016 16  14 - 54 U/L Final  . Alkaline Phosphatase 04/10/2016 77  38 - 126 U/L Final  . Total Bilirubin 04/10/2016 0.7  0.3 - 1.2 mg/dL Final  . GFR calc non Af Amer 04/10/2016 >60  >60 mL/min Final  . GFR calc Af Amer 04/10/2016 >60  >60 mL/min Final   Comment: (NOTE) The eGFR has been calculated using the CKD EPI equation. This calculation has not been validated in all clinical situations. eGFR's persistently <60 mL/min signify possible Chronic Kidney Disease.   . Anion gap 04/10/2016 8  5 - 15 Final      Imaging: Dg Foot Complete Right  Result Date: 04/18/2016 CLINICAL DATA:  Dropped faucet on third toe yesterday, swelling and blistering. EXAM: RIGHT FOOT COMPLETE - 3+ VIEW COMPARISON:  RIGHT foot radiograph July 29, 2015 FINDINGS: Third toe obscured on lateral radiograph. Acute suspected nondisplaced tuft fracture third distal phalanx without intra-articular extension. No dislocation. No destructive bony lesions.  Mild midfoot osteoarthrosis. Third digit soft tissue swelling without subcutaneous gas or radiopaque foreign bodies. IMPRESSION: Suspected nondisplaced acute third distal phalanx tuft fracture, which would be better characterized on toe radiographs as clinically indicated. Electronically Signed  By: Elon Alas M.D.   On: 04/18/2016 22:35    Speciality Comments: No specialty comments available.    Procedures:  No procedures performed Allergies: Codeine; Other; Penicillins; and Gluten meal   Assessment / Plan:     Visit Diagnoses: Psoriatic arthritis (H[facility])She continues to have significant synovial thickening in her recent ultrasound did not show any synovitis.  Other psoriasis: She had no active skin lesions.  High risk medication use - Methotrexate 1.0 ml, folic acid 84m, IV Remicade. Her labs have been stable we'll continue to monitor her labs with her infusions.  Primary osteoarthritis of both hands: Chronic pain  DDD lumbar spine: She continues to have some lower back discomfort  Osteopenia of forearm, unspecified laterality: Monitored by her PCP  She had recent right toe fracture for which she's been seen by orthopedics.  Cigarette smoker  History of colonic polyps  History of hypertension  Family history of GERD  History of hypothyroidism    Orders: No orders of the defined types were placed in this encounter.  No orders of the defined types were placed in this encounter.   Face-to-face time spent with patient was 30 minutes. 50% of time was spent in counseling and coordination of care.  Follow-Up Instructions: Return in about 5 months (around 10/07/2016) for Psoriatic arthritis.   SBo Merino MD  Note - This record has been created using DEditor, commissioning  Chart creation errors have been sought, but may not always  have been located. Such creation errors do not reflect on  the standard of medical care.

## 2016-05-07 ENCOUNTER — Encounter: Payer: Self-pay | Admitting: Rheumatology

## 2016-05-07 ENCOUNTER — Ambulatory Visit (INDEPENDENT_AMBULATORY_CARE_PROVIDER_SITE_OTHER): Payer: BLUE CROSS/BLUE SHIELD | Admitting: Rheumatology

## 2016-05-07 VITALS — BP 118/70 | HR 78 | Resp 14 | Wt 132.0 lb

## 2016-05-07 DIAGNOSIS — Z860100 Personal history of colon polyps, unspecified: Secondary | ICD-10-CM

## 2016-05-07 DIAGNOSIS — Z8379 Family history of other diseases of the digestive system: Secondary | ICD-10-CM

## 2016-05-07 DIAGNOSIS — L4050 Arthropathic psoriasis, unspecified: Secondary | ICD-10-CM

## 2016-05-07 DIAGNOSIS — M19042 Primary osteoarthritis, left hand: Secondary | ICD-10-CM

## 2016-05-07 DIAGNOSIS — M47816 Spondylosis without myelopathy or radiculopathy, lumbar region: Secondary | ICD-10-CM

## 2016-05-07 DIAGNOSIS — M85839 Other specified disorders of bone density and structure, unspecified forearm: Secondary | ICD-10-CM

## 2016-05-07 DIAGNOSIS — M19041 Primary osteoarthritis, right hand: Secondary | ICD-10-CM

## 2016-05-07 DIAGNOSIS — L408 Other psoriasis: Secondary | ICD-10-CM | POA: Diagnosis not present

## 2016-05-07 DIAGNOSIS — Z79899 Other long term (current) drug therapy: Secondary | ICD-10-CM | POA: Diagnosis not present

## 2016-05-07 DIAGNOSIS — F17210 Nicotine dependence, cigarettes, uncomplicated: Secondary | ICD-10-CM

## 2016-05-07 DIAGNOSIS — Z8639 Personal history of other endocrine, nutritional and metabolic disease: Secondary | ICD-10-CM

## 2016-05-07 DIAGNOSIS — Z8679 Personal history of other diseases of the circulatory system: Secondary | ICD-10-CM

## 2016-05-08 ENCOUNTER — Other Ambulatory Visit: Payer: Self-pay | Admitting: Radiology

## 2016-05-08 NOTE — Progress Notes (Signed)
Orders are in system for the Remicade cbc cmp tylenol and benadryl TB gold neg 07/2015

## 2016-05-13 ENCOUNTER — Telehealth: Payer: Self-pay | Admitting: Pharmacist

## 2016-05-13 NOTE — Telephone Encounter (Signed)
I spoke to patient while she was in the office on 05/07/16 regarding her Remicade infusion.  I informed her that her insurance, [employer] [place], is implementing new site of care guidelines and will not cover infusions at hospital outpatient infusion centers any longer.  Patient is scheduled for infusion on 05/22/16, and I advised we will need to identify an alternate infusion site.  Discussed alternate infusion centers such as [facility] ([facility]).  Patient was agreeable to infuse at [facility].  I advised her I will look into whether her insurance will cover her infusion at [place], and will follow up on how to schedule her infusion at [facility].    I spoke to [place] at [facility] infusion center and identified that their infusions are administered through [facility].  [facility] completes all of their pre-certifications.  I spoke to [place] with [facility] regarding this patient's infusion pre-certification and she is going to review whether her current pre-certification will cover her at their location.  I will update patient as soon as I hear back from [place] with [facility].    Elisabeth Most, Pharm.D., BCPS, CPP Clinical Pharmacist Pager: [phone] Phone: [phone] 05/13/2016 2:41 PM

## 2016-05-15 ENCOUNTER — Other Ambulatory Visit: Payer: Self-pay | Admitting: Radiology

## 2016-05-15 ENCOUNTER — Telehealth: Payer: Self-pay | Admitting: Pharmacist

## 2016-05-15 DIAGNOSIS — L4050 Arthropathic psoriasis, unspecified: Secondary | ICD-10-CM

## 2016-05-15 NOTE — Telephone Encounter (Signed)
Verbal order from Dr. Estanislado Pandy okay to place referral to [place].  Ivin Booty, can you put in referral for this patient? I have already discussed referral with Diane at [facility] who is going to be on the lookout for the referral.  I have advised her patient travels for work and will be in town 5/2 through 5/4 and 5/7 through 5/8.

## 2016-05-15 NOTE — Progress Notes (Signed)
Order placed for [facility] so patient can meet a physician there prior to starting her infusions at [facility]

## 2016-05-15 NOTE — Telephone Encounter (Signed)
I received information with Sharmon Leyden with [facility] regarding transitioning patient to [facility] for her infusions.  Sharmon Leyden needs records for patient in order to get her infusion approved through insurance.  I informed patient of this information.  Patient will come by the office and sign a release of medical records so that information can be sent to [facility].    Per [facility] policy, patient will need to be referred to [facility].  Dr. Estanislado Pandy, okay to refer patient to [place]?

## 2016-05-18 NOTE — Telephone Encounter (Signed)
I called patient to follow up.  Patient reports she is scheduled with visit at [facility] on 05/26/16.  Patient thought that appointment was for visit with Dr. Felecia Shelling and infusion.  I called [facility] for clarification and spoke with Inez Catalina.  I clarified that the 05/26/16 appointment is for the visit with Dr. Felecia Shelling only, not for the infusion.  To my knowledge [facility] is still working on getting patient approved for her infusion at their location.   I called patient to update her on this.  I had to leave a message for her.  I informed patient I received notification that as of 05/15/16, Londra with [facility] had completed all forms and submitted to intake, and she will be in touch regarding her infusion.  I will update patient if I hear any additional information regarding her infusion.    Elisabeth Most, Pharm.D., BCPS, CPP Clinical Pharmacist Pager: [phone] Phone: [phone] 05/18/2016 1:45 PM

## 2016-05-20 ENCOUNTER — Other Ambulatory Visit: Payer: Self-pay | Admitting: Family Medicine

## 2016-05-20 MED ORDER — VALACYCLOVIR HCL 1 G PO TABS: 1000.0000 mg | ORAL_TABLET | Freq: Three times a day (TID) | ORAL | 0 refills | Status: DC

## 2016-05-20 NOTE — Progress Notes (Signed)
She called with symptoms of Shingles. Valtrex called in

## 2016-05-21 ENCOUNTER — Ambulatory Visit (INDEPENDENT_AMBULATORY_CARE_PROVIDER_SITE_OTHER): Payer: BLUE CROSS/BLUE SHIELD | Admitting: Family Medicine

## 2016-05-21 ENCOUNTER — Telehealth: Payer: Self-pay | Admitting: Pharmacist

## 2016-05-21 VITALS — BP 130/90 | HR 80

## 2016-05-21 DIAGNOSIS — Z79899 Other long term (current) drug therapy: Secondary | ICD-10-CM | POA: Diagnosis not present

## 2016-05-21 DIAGNOSIS — L4050 Arthropathic psoriasis, unspecified: Secondary | ICD-10-CM | POA: Diagnosis not present

## 2016-05-21 DIAGNOSIS — B028 Zoster with other complications: Secondary | ICD-10-CM | POA: Diagnosis not present

## 2016-05-21 NOTE — Progress Notes (Signed)
   Subjective:    Patient ID: April [facility], female    DOB: 16-Nov-1955, 61 y.o.   MRN: [id number]  HPI She is here for consult concerning possible shingles. She did call last night and verbally gave me a good case for treating her. She was started on Valtrex last night. She states the rash started approximately 3 days ago. Presently she is taking methotrexate as well as Remicade for treatment of psoriatic arthritis. She does have difficulty with codeine causing nausea.   Review of Systems     Objective:   Physical Exam Alert and in no distress. Exam of the torso does show erythematous slightly raised lesions in the left teeth 9 as well as potentially in the T XI nerve root distribution.       Assessment & Plan:  Herpes zoster with complication  Psoriatic arthritis ([place])  High risk medication use Will continue on her Valtrex. Discussed the fact that this appears to be 2 different nerve roots especially because of her underlying immune status. Cautioned on worsening of the symptoms as well as systemic response with fever, chills, worsening of the rash or evidence of secondary infection. She will keep in touch on that. I also discussed pH and with her.

## 2016-05-21 NOTE — Patient Instructions (Signed)
Shingles Shingles is an infection that causes a painful skin rash and fluid-filled blisters. Shingles is caused by the same virus that causes chickenpox. Shingles only develops in people who:  Have had chickenpox.  Have gotten the chickenpox vaccine. (This is rare.) The first symptoms of shingles may be itching, tingling, or pain in an area on your skin. A rash will follow in a few days or weeks. The rash is usually on one side of the body in a bandlike or beltlike pattern. Over time, the rash turns into fluid-filled blisters that break open, scab over, and dry up. Medicines may:  Help you manage pain.  Help you recover more quickly.  Help to prevent long-term problems. Follow these instructions at home: Medicines   Take medicines only as told by your doctor.  Apply an anti-itch or numbing cream to the affected area as told by your doctor. Blister and Rash Care   Take a cool bath or put cool compresses on the area of the rash or blisters as told by your doctor. This may help with pain and itching.  Keep your rash covered with a loose bandage (dressing). Wear loose-fitting clothing.  Keep your rash and blisters clean with mild soap and cool water or as told by your doctor.  Check your rash every day for signs of infection. These include redness, swelling, and pain that lasts or gets worse.  Do not pick your blisters.  Do not scratch your rash. General instructions   Rest as told by your doctor.  Keep all follow-up visits as told by your doctor. This is important.  Until your blisters scab over, your infection can cause chickenpox in people who have never had it or been vaccinated against it. To prevent this from happening, avoid touching other people or being around other people, especially:  Babies.  Pregnant women.  Children who have eczema.  Elderly people who have transplants.  People who have chronic illnesses, such as leukemia or AIDS. Contact a doctor  if:  Your pain does not get better with medicine.  Your pain does not get better after the rash heals.  Your rash looks infected. Signs of infection include:  Redness.  Swelling.  Pain that lasts or gets worse. Get help right away if:  The rash is on your face or nose.  You have pain in your face, pain around your eye area, or loss of feeling on one side of your face.  You have ear pain or you have ringing in your ear.  You have loss of taste.  Your condition gets worse. This information is not intended to replace advice given to you by your health care provider. Make sure you discuss any questions you have with your health care provider. Document Released: 06/24/2007 Document Revised: 09/01/2015 Document Reviewed: 10/17/2013 Elsevier Interactive Patient Education  2017 Elsevier Inc. Take 2 Aleve twice per day as needed for pain

## 2016-05-21 NOTE — Telephone Encounter (Signed)
I received the following update from [place] at [facility]  "We are awaiting prior auth approval. It was signed by the MD and sent yesterday 5/1.    Notes say 5-7 day turnaround. Otila Kluver and Mindy at [facility] are watching for approval as well as I am. They will keep the MD aware of approval/denial."  I called patient and updated her on this information.  I advised I will let her know if I hear anything else, but it sounds like the nurses at [facility] will contact her once they get the approval from her insurance.    Patient reports she was diagnosed with shingles today.  I discussed with Dr. Estanislado Pandy.  I reviewed with patient that Remicade must be held until her shingles has dried up and she has completed her antiviral therapy.  Also counseled patient to hold methotrexate until shingles rash has dried up.  Patient voiced understanding.   Elisabeth Most, Pharm.D., BCPS, CPP Clinical Pharmacist Pager: [phone] Phone: [phone] 05/21/2016 4:51 PM

## 2016-05-22 ENCOUNTER — Telehealth: Payer: Self-pay | Admitting: Family Medicine

## 2016-05-22 ENCOUNTER — Other Ambulatory Visit: Payer: Self-pay

## 2016-05-22 ENCOUNTER — Ambulatory Visit (HOSPITAL_COMMUNITY): Payer: BLUE CROSS/BLUE SHIELD

## 2016-05-22 MED ORDER — TRAMADOL HCL 50 MG PO TABS: 50.0000 mg | ORAL_TABLET | Freq: Three times a day (TID) | ORAL | 0 refills | Status: DC | PRN

## 2016-05-22 NOTE — Telephone Encounter (Signed)
I discussed this with her. Go ahead and call the tramadol in.

## 2016-05-22 NOTE — Telephone Encounter (Signed)
Done

## 2016-05-22 NOTE — Telephone Encounter (Signed)
Called in Tramadol per jcl

## 2016-05-22 NOTE — Telephone Encounter (Signed)
Pt called back and states that she called and that she is in a lot pain from the shingles and would like something for pain pt uses [facility] [address], [place] [address]

## 2016-05-23 ENCOUNTER — Telehealth: Payer: Self-pay | Admitting: Medical

## 2016-05-26 ENCOUNTER — Ambulatory Visit (INDEPENDENT_AMBULATORY_CARE_PROVIDER_SITE_OTHER): Payer: BLUE CROSS/BLUE SHIELD | Admitting: Neurology

## 2016-05-26 ENCOUNTER — Encounter: Payer: Self-pay | Admitting: Neurology

## 2016-05-26 ENCOUNTER — Encounter (INDEPENDENT_AMBULATORY_CARE_PROVIDER_SITE_OTHER): Payer: Self-pay

## 2016-05-26 VITALS — BP 128/71 | HR 73 | Resp 14 | Ht 66.0 in | Wt 130.0 lb

## 2016-05-26 DIAGNOSIS — L4050 Arthropathic psoriasis, unspecified: Secondary | ICD-10-CM | POA: Diagnosis not present

## 2016-05-26 DIAGNOSIS — B029 Zoster without complications: Secondary | ICD-10-CM | POA: Diagnosis not present

## 2016-05-26 DIAGNOSIS — Z79899 Other long term (current) drug therapy: Secondary | ICD-10-CM | POA: Diagnosis not present

## 2016-05-26 MED ORDER — LIDOCAINE 5 % EX PTCH: 2.0000 | MEDICATED_PATCH | CUTANEOUS | 1 refills | Status: DC

## 2016-05-26 NOTE — Progress Notes (Signed)
[facility]  PATIENT: April Cox DOB: 10/22/1955  REFERRING DOCTOR OR PCP:  Dr. Estanislado Pandy SOURCE: patient, notes from Dr. Estanislado Pandy, lab results  _________________________________   HISTORICAL  CHIEF COMPLAINT:  Chief Complaint  Patient presents with  . Advice Only    Dr. Estanislado Pandy pt. here for consult for  Remicade  infusion in our office/fim    HISTORY OF PRESENT ILLNESS:  I had the pleasure seeing you patient, April Cox, at [facility] for neurologic consultation regarding her need for Remicade infusion for her psoriatic arthritis and psoriasis.  She has had joint pain, especially in her hands x 15 years.   She has had hand joint deformations x many years.   For many years, she was treated with NSAIDs.  She also has had scalp and hand psoriasis, initially diagnosed as eczema.   She saw Dr. Estanislado Pandy and was diagnosed with psoriatic arthritis.   Initially, methotrexate was started and she was then switched to Humira.   She had incomplete benefit and was switched to Remicade with better control.   She is currently on Remicade 400 mg every 6 weeks over 2 1/2 to 3 hours.  She has tolerated the infusions at this dose and frequency welll.  She had her last infusion 6 weeks ago.   She gets a CMP and CBC with each dose.   Her last TB test was 08/02/2015.      She has shingles that started 6 days ago in the left lower thoracic region.  Pain is moderately severe.  Valtrex was started.   She takes tramadol at night.  She cannot tolerate opiates.  She has not tried Lidoderm.    She is allergic to PCN, codeine.       REVIEW OF SYSTEMS: Constitutional: No fevers, chills, sweats, or change in appetite Eyes: No visual changes, double vision, eye pain Ear, nose and throat: No hearing loss, ear pain, nasal congestion, sore throat Cardiovascular: No chest pain, palpitations Respiratory: No shortness of breath at rest or with exertion.   No  wheezes GastrointestinaI: No nausea, vomiting, diarrhea, abdominal pain, fecal incontinence.  Has GERD Genitourinary: No dysuria, urinary retention or frequency.  No nocturia. Musculoskeletal: No neck pain, back pain Integumentary: Shingles rash Neurological: as above Psychiatric: No depression at this time.  No anxiety Endocrine: She has hypothyroidism.  No palpitations, diaphoresis, change in appetite, change in weigh or increased thirst Hematologic/Lymphatic: No anemia, purpura, petechiae. Allergic/Immunologic: No itchy/runny eyes, nasal congestion, recent allergic reactions, rashes  ALLERGIES: Allergies  Allergen Reactions  . Codeine Hives and Nausea And Vomiting    REACTION: severe   . Other Anaphylaxis    Bee stings  . Penicillins Shortness Of Breath, Swelling and Rash  . Gluten Meal Swelling and Other (See Comments)    Inflammation  (this a trigger due to Psoriatic arthritis    HOME MEDICATIONS:  Current Outpatient Prescriptions:  .  atorvastatin (LIPITOR) 40 MG tablet, Take 1 tablet (40 mg total) by mouth every morning., Disp: 90 tablet, Rfl: 3 .  calcium-vitamin D (OSCAL WITH D) 500-200 MG-UNIT per tablet, Take 1 tablet by mouth 2 (two) times daily.  , Disp: , Rfl:  .  Cholecalciferol (VITAMIN D PO), Take 500 tablets by mouth daily. , Disp: , Rfl:  .  clobetasol ointment (TEMOVATE) 0.17 %, Apply 1 application topically as needed (psiorisis). , Disp: , Rfl: 0 .  EPINEPHrine (EPIPEN 2-PAK) 0.3 mg/0.3 mL IJ SOAJ injection, INJECT 0.3 MLS (0.3  MG TOTAL) INTO THE MUSCLE ONCE., Disp: 1 Device, Rfl: 2 .  fish oil-omega-3 fatty acids 1000 MG capsule, Take 1 g by mouth daily. , Disp: , Rfl:  .  folic acid (FOLVITE) 1 MG tablet, TAKE 2 TABLETS BY MOUTH DAILY, Disp: 60 tablet, Rfl: 10 .  inFLIXimab (REMICADE) 100 MG injection, Inject 6 mg/kg into the vein every 6 (six) weeks. Infuse 400mg  (4 vials) in 225mL NS over 2 hours every 6 weeks, Disp: , Rfl:  .  levothyroxine (SYNTHROID)  25 MCG tablet, Take 1 tablet (25 mcg total) by mouth daily before breakfast., Disp: 90 tablet, Rfl: 3 .  lidocaine (LIDODERM) 5 %, Place 2 patches onto the skin daily. Remove & Discard patch within 12 hours or as directed by MD, Disp: 60 patch, Rfl: 1 .  MAGNESIUM PO, Take 20 mg by mouth 2 (two) times daily. , Disp: , Rfl:  .  methotrexate 250 MG/10ML injection, INJECT 0.8ML ONCE WEEKLY, Disp: 10 mL, Rfl: 0 .  metoprolol succinate (TOPROL-XL) 50 MG 24 hr tablet, TAKE ONE TABLET BY MOUTH DAILY WITH OR IMMEDIATELY FOLLOWING A MEAL, Disp: 90 tablet, Rfl: 3 .  MONOJECT 1CC TB SYR 27GX1/2" 27G X 1/2" 1 ML MISC, AS DIRECTED, Disp: 12 each, Rfl: 2 .  naproxen sodium (ANAPROX) 220 MG tablet, Take 220 mg by mouth 2 (two) times daily as needed (dental pain)., Disp: , Rfl:  .  pantoprazole (PROTONIX) 40 MG tablet, TAKE 1 TABLET BY MOUTH DAILY, Disp: 90 tablet, Rfl: 2 .  traMADol (ULTRAM) 50 MG tablet, Take 1 tablet (50 mg total) by mouth every 8 (eight) hours as needed., Disp: 30 tablet, Rfl: 0 .  valACYclovir (VALTREX) 1000 MG tablet, Take 1 tablet (1,000 mg total) by mouth 3 (three) times daily., Disp: 21 tablet, Rfl: 0 .  vitamin C (ASCORBIC ACID) 250 MG tablet, Take 250 mg by mouth daily.  , Disp: , Rfl:   PAST MEDICAL HISTORY: Past Medical History:  Diagnosis Date  . Abnormal Pap smear of cervix   . Anemia    history of anemia  . Arthritis   . Diverticulosis of colon   . Family history of adverse reaction to anesthesia    mother PONV  . GERD (gastroesophageal reflux disease)   . Heart murmur    asymptomatic per pt  . History of adenomatous polyp of colon    11-06-2009  tubullar  . HPV in female   . Hyperlipidemia   . Hypertension   . Hypothyroidism   . Mild aortic valve stenosis    per echo 10-09-2010  ef 60-65%  valva area 1.48cm  . Psoriasis   . Psoriatic arthritis ([place])    Dr. Estanislado Pandy  . Wears glasses     PAST SURGICAL HISTORY: Past Surgical History:  Procedure Laterality Date   . ABDOMINAL HYSTERECTOMY    . ANTERIOR LAT LUMBAR FUSION  12/25/2011   Procedure: ANTERIOR LATERAL LUMBAR FUSION 1 LEVEL;  Surgeon: Eustace Moore, MD;  Location: [facility];  Service: Neurosurgery;  Laterality: Left;  LUMBAR FOUR-FIVE  . BLADDER SUSPENSION N/A 12/19/2014   Procedure: TRANSVAGINAL TAPE (TVT) PROCEDURE;  Surgeon: Everett Graff, MD;  Location: [facility];  Service: Gynecology;  Laterality: N/A;  . CARPAL TUNNEL RELEASE Right April 2015  . CERVICAL CONIZATION W/BX N/A 08/28/2014   Procedure: CONIZATION CERVIX WITH BIOPSY;  Surgeon: Ena Dawley, MD;  Location: [facility];  Service: Gynecology;  Laterality: N/A;  . COLONOSCOPY  11-06-2009  .  CYSTO N/A 12/19/2014   Procedure: CYSTO;  Surgeon: Everett Graff, MD;  Location: [facility];  Service: Gynecology;  Laterality: N/A;  . DENTAL SURGERY    . HYSTEROSCOPY W/D&C  04-04-2008  . LUMBAR PERCUTANEOUS PEDICLE SCREW 1 LEVEL  12/25/2011   Procedure: LUMBAR PERCUTANEOUS PEDICLE SCREW 1 LEVEL;  Surgeon: Eustace Moore, MD;  Location: [facility];  Service: Neurosurgery;  Laterality: Left;  LUMBAR FOUR-FIVE  . PERONEAL NERVE DECOMPRESSION Left 12-27-2009   for neuropathy with  foot drop  . TRANSTHORACIC ECHOCARDIOGRAM  10-09-2010   mild LVH,  ef 60-65%,  grade I diastolic dysfunction,  very mild AV stenosis (area 1.48cm/S/2(VTI) with no regurg./  mild MV calcification without stenosis or regur. /  trivial TR  . VAGINAL HYSTERECTOMY Bilateral 12/19/2014   Procedure: TOTAL VAGINAL HYSTERECTOMY WITH BILATERAL SALPINGECTOMY;  Surgeon: Ena Dawley, MD;  Location: [facility];  Service: Gynecology;  Laterality: Bilateral;    FAMILY HISTORY: Family History  Problem Relation Age of Onset  . Arthritis Mother     OA   . Psoriasis Mother   . Other Father     accidental death/MVA at age 10  . Hypertension Brother   . Other Brother     1 brother drowned  . Hypertension Maternal Grandmother   . Diabetes Maternal Grandmother   .  Hypertension Maternal Grandfather   . Heart disease Neg Hx   . Cancer Neg Hx     SOCIAL HISTORY:  Social History   Social History  . Marital status: Married    Spouse name: N/A  . Number of children: N/A  . Years of education: N/A   Occupational History  . Not on file.   Social History Main Topics  . Smoking status: Current Some Day Smoker    Packs/day: 1.00    Years: 0.00    Types: Cigarettes  . Smokeless tobacco: Never Used  . Alcohol use 3.5 oz/week    7 Standard drinks or equivalent per week     Comment: OCCASIONAL  . Drug use: No  . Sexual activity: Yes   Other Topics Concern  . Not on file   Social History Narrative  . No narrative on file     PHYSICAL EXAM  Vitals:   05/26/16 1448  BP: 128/71  Pulse: 73  Resp: 14  Weight: 130 lb (59 kg)  Height: 5\' 6"  (1.676 m)    Body mass index is 20.98 kg/m.   General: The patient is well-developed and well-nourished and in no acute distress   Neck: The neck is supple, no carotid bruits are noted.  The neck is nontender.  Cardiovascular: The heart has a regular rate and rhythm with a normal S1 and S2. There were no murmurs, gallops or rubs. Lungs are clear to auscultation.  Skin: Extremities Show a fascicular rash in the left T10 and left T8 dermatomes..   No edema.  Musculoskeletal:  Joint changes are noted in both hands with enlargement of some of the PIP and DIP joints.  Back is nontender   she is wearing a right boot.  Neurologic Exam  Mental status: The patient is alert and oriented x 3 at the time of the examination. The patient has apparent normal recent and remote memory, with an apparently normal attention span and concentration ability.   Speech is normal.  Cranial nerves: Extraocular movements are full. Facial strength and sensation is normal..  Trapezius and sternocleidomastoid strength is normal. No dysarthria is noted.  The  tongue is midline, and the patient has symmetric elevation of the soft  palate. No obvious hearing deficits are noted.  Motor:  Muscle bulk is normal.   Tone is normal. Strength is  5 / 5 in all 4 extremities.   Sensory: Sensory testing is intact to pinprick, soft touch and vibration sensation in all 4 extremities.     Coordination: Cerebellar testing reveals good finger-nose-finger and heel-to-shin bilaterally.  Gait and station: Station is normal.   Gait is normal. Tandem gait is normal. Romberg is negative.   Reflexes: Deep tendon reflexes are symmetric and normal bilaterally.       DIAGNOSTIC DATA (LABS, IMAGING, TESTING) - I reviewed patient records, labs, notes, testing and imaging myself where available.  Lab Results  Component Value Date   WBC 7.7 04/10/2016   HGB 13.2 04/10/2016   HCT 39.3 04/10/2016   MCV 98.3 04/10/2016   PLT 291 04/10/2016      Component Value Date/Time   NA 137 04/10/2016 1203   K 3.5 04/10/2016 1203   CL 103 04/10/2016 1203   CO2 26 04/10/2016 1203   GLUCOSE 99 04/10/2016 1203   BUN <5 (L) 04/10/2016 1203   CREATININE 0.59 04/10/2016 1203   CALCIUM 9.5 04/10/2016 1203   PROT 7.5 04/10/2016 1203   ALBUMIN 3.8 04/10/2016 1203   AST 21 04/10/2016 1203   ALT 16 04/10/2016 1203   ALKPHOS 77 04/10/2016 1203   BILITOT 0.7 04/10/2016 1203   GFRNONAA >60 04/10/2016 1203   GFRAA >60 04/10/2016 1203   Lab Results  Component Value Date   CHOL 227 (H) 01/21/2016   HDL 53 01/21/2016   LDLCALC 144 (H) 01/21/2016   TRIG 152 (H) 01/21/2016   CHOLHDL 4.3 01/21/2016   No results found for: HGBA1C No results found for: VITAMINB12 Lab Results  Component Value Date   TSH 2.60 01/21/2016       ASSESSMENT AND PLAN  Psoriatic arthritis ([place])  Herpes zoster without complication  High risk medication use  1.  Remicade 400 mg every 6 weeks. 2.   We will check CMP, CBC at the time of her infusion. 3.    Lidoderm patches for shingles.  If pain persists, consider an oral medication. 4.   She will return as needed.      Tehya Leath A. Felecia Shelling, MD, PhD 01/25/9448, 3:88 PM Certified in Neurology, Clinical Neurophysiology, Sleep Medicine, Pain Medicine and Neuroimaging  [facility] [address] [place], [place] [address] [phone]

## 2016-05-28 NOTE — Telephone Encounter (Signed)
Recv'd response back that P.A. Not required for this medication, pt filled on 05/22/16

## 2016-05-29 ENCOUNTER — Ambulatory Visit (INDEPENDENT_AMBULATORY_CARE_PROVIDER_SITE_OTHER): Payer: BLUE CROSS/BLUE SHIELD | Admitting: Family Medicine

## 2016-05-29 ENCOUNTER — Encounter: Payer: Self-pay | Admitting: Family Medicine

## 2016-05-29 VITALS — BP 130/80 | HR 65 | Temp 98.3°F | Wt 133.0 lb

## 2016-05-29 DIAGNOSIS — H1032 Unspecified acute conjunctivitis, left eye: Secondary | ICD-10-CM

## 2016-05-29 MED ORDER — ERYTHROMYCIN 5 MG/GM OP OINT: 1.0000 "application " | TOPICAL_OINTMENT | Freq: Three times a day (TID) | OPHTHALMIC | 0 refills | Status: DC

## 2016-05-29 NOTE — Patient Instructions (Signed)

## 2016-05-29 NOTE — Progress Notes (Signed)
   Subjective:    Patient ID: April Cox, female    DOB: [birth year], 61 y.o.   MRN: [id number]  HPI Chief Complaint  Patient presents with  . possible pink eye.    left eye, this morning swollen shut and stabbing pain.    She is here with complaints of an acute onset of left eye redness, itching, burning, lid swelling since yesterday. States this morning her left eye lid and lashes were matted together. Has tried generic eye drops without relief. States symptoms are getting worse. No contact lenses.  Denies fever, chills, eye pain, vision changes, foreign body sensation.   Reviewed allergies, medications, past medical, surgical, and social history.    Review of Systems Pertinent positives and negatives in the history of present illness.     Objective:   Physical Exam  Constitutional: She appears well-developed and well-nourished. No distress.  HENT:  Nose: Nose normal.  Mouth/Throat: Uvula is midline, oropharynx is clear and moist and mucous membranes are normal.  Eyes: EOM are normal. Pupils are equal, round, and reactive to light. Left conjunctiva is injected. Left conjunctiva has no hemorrhage.    Fundi benign. No obvious foreign body. Left upper and lower lids mildly swollen.   Skin: Skin is warm and dry.   BP 130/80   Pulse 65   Temp 98.3 F (36.8 C) (Oral)   Wt 133 lb (60.3 kg)   BMI 21.47 kg/m        Assessment & Plan:  Acute conjunctivitis of left eye, unspecified acute conjunctivitis type - Plan: erythromycin (ROMYCIN) ophthalmic ointment  Will treat with antibiotic ointment and have her follow up if not improving or if symptoms get worse. She may use cool compresses. Advised to change eye makeup and wash hands frequently.

## 2016-06-08 DIAGNOSIS — L4050 Arthropathic psoriasis, unspecified: Secondary | ICD-10-CM | POA: Diagnosis not present

## 2016-06-25 ENCOUNTER — Telehealth (INDEPENDENT_AMBULATORY_CARE_PROVIDER_SITE_OTHER): Payer: Self-pay

## 2016-06-25 NOTE — Telephone Encounter (Signed)
Patient would like blood work orders to be sent to [facility].  CB# is [phone].  Please Advise.

## 2016-06-26 ENCOUNTER — Telehealth: Payer: Self-pay | Admitting: Rheumatology

## 2016-06-26 NOTE — Telephone Encounter (Signed)
I have left message for her to call us back

## 2016-06-26 NOTE — Telephone Encounter (Signed)
Patient returned your call.

## 2016-06-26 NOTE — Telephone Encounter (Signed)
Lab orders are with infusion orders. This is in Media tab.

## 2016-07-11 ENCOUNTER — Other Ambulatory Visit: Payer: Self-pay | Admitting: Rheumatology

## 2016-07-13 ENCOUNTER — Encounter: Payer: Self-pay | Admitting: Family Medicine

## 2016-07-13 ENCOUNTER — Ambulatory Visit (INDEPENDENT_AMBULATORY_CARE_PROVIDER_SITE_OTHER): Payer: BLUE CROSS/BLUE SHIELD | Admitting: Family Medicine

## 2016-07-13 VITALS — BP 140/86 | HR 92 | Temp 99.1°F | Ht 66.0 in | Wt 131.0 lb

## 2016-07-13 DIAGNOSIS — Z79899 Other long term (current) drug therapy: Secondary | ICD-10-CM | POA: Diagnosis not present

## 2016-07-13 DIAGNOSIS — L03211 Cellulitis of face: Secondary | ICD-10-CM

## 2016-07-13 DIAGNOSIS — L4050 Arthropathic psoriasis, unspecified: Secondary | ICD-10-CM | POA: Diagnosis not present

## 2016-07-13 MED ORDER — MUPIROCIN CALCIUM 2 % EX CREA: 1.0000 "application " | TOPICAL_CREAM | Freq: Two times a day (BID) | CUTANEOUS | 0 refills | Status: DC

## 2016-07-13 NOTE — Telephone Encounter (Signed)
ok, labs are due

## 2016-07-13 NOTE — Progress Notes (Signed)
Chief Complaint  Patient presents with  . Eye Problem    left eyelid has a littel white bump on it x 10 days. Has been using neosporin on it and it's not helping. Itching and uncomfortable.    Recently had shingles (on her back/torso), followed by an episode of pinkeye.  After that cleared up, she noticed her current problem. Felt like dry skin on her eyelid initially.  She has had psoriasis on her eyelids before, so used her facial cream (for psoriasis) on her eyelid. It didn't seem to help.  She then developed a stinging sensation, so started using neosporin.    Symptoms started 2 weeks ago.  It is now tender (stinging, not "hurting").  Denies vision changes, itchy/watery eyes, eye pain  PMH, PSH, SH reviewed Smoker, but has a quit date of 6/30, to quit along with her husband prior to their beach trip with their family.  Outpatient Encounter Prescriptions as of 07/13/2016  Medication Sig Note  . atorvastatin (LIPITOR) 40 MG tablet Take 1 tablet (40 mg total) by mouth every morning.   . calcium-vitamin D (OSCAL WITH D) 500-200 MG-UNIT per tablet Take 1 tablet by mouth 2 (two) times daily.     . Cholecalciferol (VITAMIN D PO) Take 500 tablets by mouth daily.    . folic acid (FOLVITE) 1 MG tablet TAKE 2 TABLETS BY MOUTH DAILY   . inFLIXimab (REMICADE) 100 MG injection Inject 6 mg/kg into the vein every 6 (six) weeks. Infuse 400mg  (4 vials) in 248mL NS over 2 hours every 6 weeks   . levothyroxine (SYNTHROID) 25 MCG tablet Take 1 tablet (25 mcg total) by mouth daily before breakfast.   . MAGNESIUM PO Take 20 mg by mouth 2 (two) times daily.    . methotrexate 250 MG/10ML injection INJECT 0.8 ML ONCE WEEKLY   . metoprolol succinate (TOPROL-XL) 50 MG 24 hr tablet TAKE ONE TABLET BY MOUTH DAILY WITH OR IMMEDIATELY FOLLOWING A MEAL   . MONOJECT 1CC TB SYR 27GX1/2" 27G X 1/2" 1 ML MISC AS DIRECTED   . pantoprazole (PROTONIX) 40 MG tablet TAKE 1 TABLET BY MOUTH DAILY   . vitamin C (ASCORBIC ACID) 250  MG tablet Take 250 mg by mouth daily.     . clobetasol ointment (TEMOVATE) 5.91 % Apply 1 application topically as needed (psiorisis).  10/22/2014: Received from: External Pharmacy  . EPINEPHrine (EPIPEN 2-PAK) 0.3 mg/0.3 mL IJ SOAJ injection INJECT 0.3 MLS (0.3 MG TOTAL) INTO THE MUSCLE ONCE. (Patient not taking: Reported on 07/13/2016)   . mupirocin cream (BACTROBAN) 2 % Apply 1 application topically 2 (two) times daily.   . [DISCONTINUED] erythromycin [facility]) ophthalmic ointment Place 1 application into the right eye 3 (three) times daily.   . [DISCONTINUED] traMADol (ULTRAM) 50 MG tablet Take 1 tablet (50 mg total) by mouth every 8 (eight) hours as needed.    No facility-administered encounter medications on file as of 07/13/2016.    Allergies  Allergen Reactions  . Codeine Hives and Nausea And Vomiting    REACTION: severe   . Other Anaphylaxis    Bee stings  . Penicillins Shortness Of Breath, Swelling and Rash  . Gluten Meal Swelling and Other (See Comments)    Inflammation  (this a trigger due to Psoriatic arthritis   ROS:  No known fever, no URI symptoms, cough, shortness of breath, chest pain, GI or GU complaints, other skin concerns  PHYSICAL EXAM:  BP 140/86 (BP Location: Left Arm, Patient Position: Sitting,  Cuff Size: Normal)   Pulse 92   Temp 99.1 F (37.3 C) (Tympanic)   Ht 5\' 6"  (1.676 m)   Wt 131 lb (59.4 kg)   BMI 21.14 kg/m   Well appearing, pleasant female in no distress Left upper eyelid--small pustule with mild surrounding erythema Conjunctiva and sclera are clear. EOMI, OP clear normal nasal mucosa Neck: no lymphadenopathy or mass  ASSESSMENT/PLAN:  Cellulitis of face - small pustule on left upper eyelid with surrounding erythema and LG fever. warm compresses and bactroban. Call/return if worsening - Plan: mupirocin cream (BACTROBAN) 2 %  Psoriatic arthritis (HCC)  High risk medications (not anticoagulants) long-term use   Discussed  Shingrix--recommended in the uture.    Apply warm compresses to the pustule on the left upper eyelid. Do this at least 3-4 times/day.  I think once it drains it will feel better. Use the topical antibacterial prescription cream two to three times daily until resolved (up to 10 days).  Call if the redness or bump is worsening rather than improving--we can change you to an oral antibiotic.  If significantly larger, may need to be lanced/drained.  Shingrix shingles vaccines is recommend--no rush since you recently had shingles (okay to wait 6 months, no need to rush).

## 2016-07-13 NOTE — Telephone Encounter (Signed)
Last visit: 05/15/16 Next Visit: 10/12/16 Labs: 04/10/16 WNL  Okay to refill MTX?

## 2016-07-13 NOTE — Patient Instructions (Signed)
  Apply warm compresses to the pustule on the left upper eyelid. Do this at least 3-4 times/day.  I think once it drains it will feel better. Use the topical antibacterial prescription cream two to three times daily until resolved (up to 10 days).  Call if the redness or bump is worsening rather than improving--we can change you to an oral antibiotic.  If significantly larger, may need to be lanced/drained.  Shingrix shingles vaccines is recommend--no rush since you recently had shingles (okay to wait 6 months, no need to rush).

## 2016-07-13 NOTE — Telephone Encounter (Signed)
Patient advised labs are due. Will have done with infusion.

## 2016-07-27 ENCOUNTER — Other Ambulatory Visit: Payer: Self-pay | Admitting: *Deleted

## 2016-07-27 ENCOUNTER — Other Ambulatory Visit (INDEPENDENT_AMBULATORY_CARE_PROVIDER_SITE_OTHER): Payer: Self-pay

## 2016-07-27 ENCOUNTER — Encounter: Payer: Self-pay | Admitting: Neurology

## 2016-07-27 DIAGNOSIS — L4050 Arthropathic psoriasis, unspecified: Secondary | ICD-10-CM | POA: Diagnosis not present

## 2016-07-27 DIAGNOSIS — Z79899 Other long term (current) drug therapy: Secondary | ICD-10-CM

## 2016-07-27 DIAGNOSIS — Z0289 Encounter for other administrative examinations: Secondary | ICD-10-CM

## 2016-07-28 LAB — CBC WITH DIFFERENTIAL/PLATELET
BASOS ABS: 0 10*3/uL (ref 0.0–0.2)
BASOS: 0 %
EOS (ABSOLUTE): 0.1 10*3/uL (ref 0.0–0.4)
Eos: 2 %
HEMOGLOBIN: 10.5 g/dL — AB (ref 11.1–15.9)
Hematocrit: 31.7 % — ABNORMAL LOW (ref 34.0–46.6)
IMMATURE GRANS (ABS): 0 10*3/uL (ref 0.0–0.1)
Immature Granulocytes: 0 %
LYMPHS: 37 %
Lymphocytes Absolute: 2 10*3/uL (ref 0.7–3.1)
MCH: 32 pg (ref 26.6–33.0)
MCHC: 33.1 g/dL (ref 31.5–35.7)
MCV: 97 fL (ref 79–97)
Monocytes Absolute: 0.3 10*3/uL (ref 0.1–0.9)
Monocytes: 6 %
NEUTROS ABS: 3 10*3/uL (ref 1.4–7.0)
Neutrophils: 55 %
PLATELETS: 271 10*3/uL (ref 150–379)
RBC: 3.28 x10E6/uL — ABNORMAL LOW (ref 3.77–5.28)
RDW: 13.7 % (ref 12.3–15.4)
WBC: 5.4 10*3/uL (ref 3.4–10.8)

## 2016-07-28 LAB — COMPREHENSIVE METABOLIC PANEL
A/G RATIO: 1.5 (ref 1.2–2.2)
ALBUMIN: 3.5 g/dL — AB (ref 3.6–4.8)
ALK PHOS: 63 IU/L (ref 39–117)
ALT: 15 IU/L (ref 0–32)
AST: 22 IU/L (ref 0–40)
BILIRUBIN TOTAL: 0.3 mg/dL (ref 0.0–1.2)
BUN / CREAT RATIO: 12 (ref 12–28)
BUN: 7 mg/dL — ABNORMAL LOW (ref 8–27)
CHLORIDE: 106 mmol/L (ref 96–106)
CO2: 22 mmol/L (ref 20–29)
Calcium: 8.6 mg/dL — ABNORMAL LOW (ref 8.7–10.3)
Creatinine, Ser: 0.6 mg/dL (ref 0.57–1.00)
GFR calc non Af Amer: 98 mL/min/{1.73_m2} (ref >59)
GFR, EST AFRICAN AMERICAN: 113 mL/min/{1.73_m2} (ref >59)
GLUCOSE: 136 mg/dL — AB (ref 65–99)
Globulin, Total: 2.4 g/dL (ref 1.5–4.5)
POTASSIUM: 3.3 mmol/L — AB (ref 3.5–5.2)
SODIUM: 142 mmol/L (ref 134–144)
TOTAL PROTEIN: 5.9 g/dL — AB (ref 6.0–8.5)

## 2016-07-29 ENCOUNTER — Telehealth: Payer: Self-pay | Admitting: *Deleted

## 2016-07-29 NOTE — Telephone Encounter (Signed)
LMOM (identiified vm) that per RAS, liver labs and wbc were good.  Hgb slightly low, so she should take otc fe supp..  She does not need to return this call unless she has questions/fim

## 2016-07-29 NOTE — Telephone Encounter (Signed)
-----   Message from Britt Bottom, MD sent at 07/28/2016  6:05 PM EDT ----- Please let her know that the liver and the white blood cell count is fine. Her hemoglobin is a little low and she should take an OTC iron supplement.

## 2016-08-06 ENCOUNTER — Telehealth: Payer: Self-pay | Admitting: Pharmacist

## 2016-08-06 DIAGNOSIS — Z79899 Other long term (current) drug therapy: Secondary | ICD-10-CM | POA: Diagnosis not present

## 2016-08-06 NOTE — Telephone Encounter (Signed)
Patient's labs from 07/27/16 were reviewed with Dr. Estanislado Pandy who advised to get repeat CBC due to mild anemia.  She also recommended repeat BMP as patient's potassium was slightly low.  Most recent TB Gold was on 08/02/15.  She is also due for TB Gold.    I informed patient that Dr. Estanislado Pandy would like repeat labs plus TB Gold.  Patient confirms she will come have labs drawn tomorrow morning.    Elisabeth Most, Pharm.D., BCPS, CPP Clinical Pharmacist Pager: [phone] Phone: [phone] 08/06/2016 1:02 PM

## 2016-08-07 ENCOUNTER — Encounter (INDEPENDENT_AMBULATORY_CARE_PROVIDER_SITE_OTHER): Payer: Self-pay

## 2016-08-07 LAB — CBC WITH DIFFERENTIAL/PLATELET
BASOS ABS: 0 {cells}/uL (ref 0–200)
Basophils Relative: 0 %
EOS PCT: 2 %
Eosinophils Absolute: 128 cells/uL (ref 15–500)
HCT: 39.9 % (ref 35.0–45.0)
HEMOGLOBIN: 13.4 g/dL (ref 11.7–15.5)
LYMPHS ABS: 2432 {cells}/uL (ref 850–3900)
Lymphocytes Relative: 38 %
MCH: 33.1 pg — AB (ref 27.0–33.0)
MCHC: 33.6 g/dL (ref 32.0–36.0)
MCV: 98.5 fL (ref 80.0–100.0)
MONO ABS: 320 {cells}/uL (ref 200–950)
MPV: 10.3 fL (ref 7.5–12.5)
Monocytes Relative: 5 %
NEUTROS ABS: 3520 {cells}/uL (ref 1500–7800)
Neutrophils Relative %: 55 %
Platelets: 294 10*3/uL (ref 140–400)
RBC: 4.05 MIL/uL (ref 3.80–5.10)
RDW: 13.7 % (ref 11.0–15.0)
WBC: 6.4 10*3/uL (ref 3.8–10.8)

## 2016-08-07 LAB — BASIC METABOLIC PANEL WITH GFR
BUN: 6 mg/dL — ABNORMAL LOW (ref 7–25)
CHLORIDE: 102 mmol/L (ref 98–110)
CO2: 25 mmol/L (ref 20–31)
Calcium: 9.5 mg/dL (ref 8.6–10.4)
Creat: 0.63 mg/dL (ref 0.50–0.99)
GFR, Est African American: >89 mL/min (ref >=60)
Glucose, Bld: 88 mg/dL (ref 65–99)
Potassium: 4.4 mmol/L (ref 3.5–5.3)
SODIUM: 135 mmol/L (ref 135–146)

## 2016-08-08 LAB — QUANTIFERON TB GOLD ASSAY (BLOOD)
Interferon Gamma Release Assay: NEGATIVE
MITOGEN-NIL SO: 7.67 [IU]/mL
QUANTIFERON NIL VALUE: 0.07 [IU]/mL
Quantiferon Tb Ag Minus Nil Value: <0 IU/mL

## 2016-08-08 NOTE — Telephone Encounter (Signed)
WNL

## 2016-08-11 DIAGNOSIS — Z6821 Body mass index (BMI) 21.0-21.9, adult: Secondary | ICD-10-CM | POA: Diagnosis not present

## 2016-08-11 DIAGNOSIS — Z1231 Encounter for screening mammogram for malignant neoplasm of breast: Secondary | ICD-10-CM | POA: Diagnosis not present

## 2016-08-11 DIAGNOSIS — R87619 Unspecified abnormal cytological findings in specimens from cervix uteri: Secondary | ICD-10-CM | POA: Diagnosis not present

## 2016-08-11 DIAGNOSIS — R87620 Atypical squamous cells of undetermined significance on cytologic smear of vagina (ASC-US): Secondary | ICD-10-CM | POA: Diagnosis not present

## 2016-08-11 DIAGNOSIS — Z01419 Encounter for gynecological examination (general) (routine) without abnormal findings: Secondary | ICD-10-CM | POA: Diagnosis not present

## 2016-08-11 LAB — HM PAP SMEAR

## 2016-08-18 ENCOUNTER — Telehealth: Payer: Self-pay

## 2016-08-18 DIAGNOSIS — M8588 Other specified disorders of bone density and structure, other site: Secondary | ICD-10-CM | POA: Diagnosis not present

## 2016-08-18 NOTE — Telephone Encounter (Signed)
Patient would like a call back from [place] concerning her lab work.  CB # is [phone].  Thank You.

## 2016-08-18 NOTE — Telephone Encounter (Signed)
Patient advised lab results are normal 

## 2016-08-25 DIAGNOSIS — R928 Other abnormal and inconclusive findings on diagnostic imaging of breast: Secondary | ICD-10-CM | POA: Diagnosis not present

## 2016-08-25 DIAGNOSIS — M810 Age-related osteoporosis without current pathological fracture: Secondary | ICD-10-CM | POA: Diagnosis not present

## 2016-08-25 DIAGNOSIS — R932 Abnormal findings on diagnostic imaging of liver and biliary tract: Secondary | ICD-10-CM | POA: Diagnosis not present

## 2016-08-25 DIAGNOSIS — R87610 Atypical squamous cells of undetermined significance on cytologic smear of cervix (ASC-US): Secondary | ICD-10-CM | POA: Diagnosis not present

## 2016-08-25 LAB — HM MAMMOGRAPHY

## 2016-08-27 ENCOUNTER — Telehealth: Payer: Self-pay | Admitting: Family Medicine

## 2016-08-27 NOTE — Telephone Encounter (Signed)
Dr Raphael Gibney called and stated that he saw this pt and wanted you to be aware that she has osteopenia with a ankle fracture. He wanted you to be aware and stated she may be a good candidate for prolia.

## 2016-08-31 ENCOUNTER — Other Ambulatory Visit: Payer: Self-pay

## 2016-08-31 DIAGNOSIS — E2839 Other primary ovarian failure: Secondary | ICD-10-CM

## 2016-08-31 NOTE — Telephone Encounter (Signed)
April Cox called me back she had a dexa last week. Dr.Stringer was wanting to give you FYI so you could decide what type of med she needs to be on.

## 2016-08-31 NOTE — Telephone Encounter (Signed)
Let's schedule her for another DEXA scan

## 2016-08-31 NOTE — Telephone Encounter (Signed)
Order put in and left message for pt to please call the breast center at [phone] to make appointment

## 2016-08-31 NOTE — Telephone Encounter (Signed)
I did get a note and his thoughts (Dr Raphael Gibney) on this but I need a copy of the DEXA scan

## 2016-09-04 ENCOUNTER — Telehealth: Payer: Self-pay | Admitting: Radiology

## 2016-09-04 ENCOUNTER — Other Ambulatory Visit: Payer: Self-pay | Admitting: Radiology

## 2016-09-04 NOTE — Telephone Encounter (Signed)
DEXA received patient has T score -3.7 forearm osteoporosis  I have sent to be scanned   There are notes in Epic about this with Dr Redmond School, he is considering Prolia for her    She is on Remicade infusions for her PsA

## 2016-09-07 ENCOUNTER — Other Ambulatory Visit (INDEPENDENT_AMBULATORY_CARE_PROVIDER_SITE_OTHER): Payer: Self-pay

## 2016-09-07 ENCOUNTER — Other Ambulatory Visit: Payer: Self-pay | Admitting: Neurology

## 2016-09-07 ENCOUNTER — Encounter: Payer: Self-pay | Admitting: Neurology

## 2016-09-07 DIAGNOSIS — L4050 Arthropathic psoriasis, unspecified: Secondary | ICD-10-CM

## 2016-09-07 DIAGNOSIS — Z79899 Other long term (current) drug therapy: Secondary | ICD-10-CM

## 2016-09-07 DIAGNOSIS — Z0289 Encounter for other administrative examinations: Secondary | ICD-10-CM

## 2016-09-08 LAB — COMPREHENSIVE METABOLIC PANEL
A/G RATIO: 1.5 (ref 1.2–2.2)
ALBUMIN: 4.3 g/dL (ref 3.6–4.8)
ALT: 19 IU/L (ref 0–32)
AST: 23 IU/L (ref 0–40)
Alkaline Phosphatase: 84 IU/L (ref 39–117)
BILIRUBIN TOTAL: 0.3 mg/dL (ref 0.0–1.2)
BUN/Creatinine Ratio: 10 — ABNORMAL LOW (ref 12–28)
BUN: 5 mg/dL — AB (ref 8–27)
CALCIUM: 9.3 mg/dL (ref 8.7–10.3)
CHLORIDE: 105 mmol/L (ref 96–106)
CO2: 22 mmol/L (ref 20–29)
CREATININE: 0.49 mg/dL — AB (ref 0.57–1.00)
GFR calc Af Amer: 120 mL/min/{1.73_m2} (ref >59)
GFR calc non Af Amer: 104 mL/min/{1.73_m2} (ref >59)
Globulin, Total: 2.8 g/dL (ref 1.5–4.5)
Glucose: 90 mg/dL (ref 65–99)
Potassium: 4.4 mmol/L (ref 3.5–5.2)
Sodium: 141 mmol/L (ref 134–144)
TOTAL PROTEIN: 7.1 g/dL (ref 6.0–8.5)

## 2016-09-08 LAB — CBC WITH DIFFERENTIAL/PLATELET
Basophils Absolute: 0 10*3/uL (ref 0.0–0.2)
Basos: 0 %
EOS (ABSOLUTE): 0.1 10*3/uL (ref 0.0–0.4)
Eos: 1 %
HEMATOCRIT: 38.1 % (ref 34.0–46.6)
Hemoglobin: 12.8 g/dL (ref 11.1–15.9)
IMMATURE GRANULOCYTES: 0 %
Immature Grans (Abs): 0 10*3/uL (ref 0.0–0.1)
LYMPHS ABS: 3.1 10*3/uL (ref 0.7–3.1)
Lymphs: 50 %
MCH: 32.4 pg (ref 26.6–33.0)
MCHC: 33.6 g/dL (ref 31.5–35.7)
MCV: 97 fL (ref 79–97)
MONOS ABS: 0.2 10*3/uL (ref 0.1–0.9)
Monocytes: 3 %
NEUTROS PCT: 46 %
Neutrophils Absolute: 3 10*3/uL (ref 1.4–7.0)
PLATELETS: 287 10*3/uL (ref 150–379)
RBC: 3.95 x10E6/uL (ref 3.77–5.28)
RDW: 14.2 % (ref 12.3–15.4)
WBC: 6.4 10*3/uL (ref 3.4–10.8)

## 2016-09-08 NOTE — Progress Notes (Signed)
WNLs

## 2016-09-09 ENCOUNTER — Ambulatory Visit (INDEPENDENT_AMBULATORY_CARE_PROVIDER_SITE_OTHER): Payer: BLUE CROSS/BLUE SHIELD | Admitting: Family Medicine

## 2016-09-09 ENCOUNTER — Encounter: Payer: Self-pay | Admitting: Family Medicine

## 2016-09-09 VITALS — BP 114/68 | HR 66 | Wt 129.4 lb

## 2016-09-09 DIAGNOSIS — M810 Age-related osteoporosis without current pathological fracture: Secondary | ICD-10-CM | POA: Diagnosis not present

## 2016-09-09 DIAGNOSIS — H1032 Unspecified acute conjunctivitis, left eye: Secondary | ICD-10-CM | POA: Diagnosis not present

## 2016-09-09 MED ORDER — ERYTHROMYCIN 5 MG/GM OP OINT: 1.0000 "application " | TOPICAL_OINTMENT | Freq: Three times a day (TID) | OPHTHALMIC | 0 refills | Status: DC

## 2016-09-09 NOTE — Progress Notes (Signed)
   Subjective:    Patient ID: April Cox, female    DOB: 09/03/1955, 61 y.o.   MRN: [id number]  HPI She complains of a one-day history of slight redness and clear drainage from the left eye but no fever, chills, earache, eye pain. Recently she had a DEXA scan done which did show evidence of osteoporosis and is interested in starting therapy.   Review of Systems     Objective:   Physical Exam Left conjunctiva is slightly injected but no drainage is noted. Anterior chamber and cornea normal. Upper and lower eyelids are normal.       Assessment & Plan:  Acute conjunctivitis of left eye, unspecified acute conjunctivitis type - Plan: erythromycin (ROMYCIN) ophthalmic ointment  Osteoporosis, unspecified osteoporosis type, unspecified pathological fracture presence  She will call if continued difficulty with the eye. Will also set her up for Prolia injections

## 2016-09-24 ENCOUNTER — Telehealth: Payer: Self-pay | Admitting: Family Medicine

## 2016-09-24 NOTE — Telephone Encounter (Signed)
Take care of this 

## 2016-09-24 NOTE — Telephone Encounter (Signed)
Ins company is requiring a prior auth form be completed for prolia. Sending back to be completed. Please return to [facility].

## 2016-09-25 ENCOUNTER — Other Ambulatory Visit: Payer: Self-pay | Admitting: Rheumatology

## 2016-09-25 NOTE — Telephone Encounter (Signed)
Sending to Kathy

## 2016-09-25 NOTE — Telephone Encounter (Signed)
Will handle.

## 2016-09-28 NOTE — Telephone Encounter (Signed)
Last visit: 05/15/16 Next Visit: 10/12/16 Labs: 09/07/16 WNL  Okay to refill per Dr. Estanislado Pandy

## 2016-09-30 NOTE — Telephone Encounter (Signed)
prolia will not by approved by her insurance. Please call pt and advise what medication you would liek her to be on. Pt can be reached at [phone]. She uses [facility] on [address].

## 2016-10-01 MED ORDER — ALENDRONATE SODIUM 70 MG PO TABS: 70.0000 mg | ORAL_TABLET | ORAL | 3 refills | Status: DC

## 2016-10-01 NOTE — Telephone Encounter (Signed)
Pt notified of fosamax and to stay upright and take with full glass of water. Called fosamax into [facility]. Victor[place] December

## 2016-10-01 NOTE — Telephone Encounter (Signed)
LMTCB

## 2016-10-01 NOTE — Telephone Encounter (Signed)
Call her and let her know that I want to put her on Fosamax 70 mg weekly. Caution her to take it on an empty stomach in the upright position with lots of water and if she has any questions call. Call in a 90 day supply for the full year

## 2016-10-12 ENCOUNTER — Ambulatory Visit: Payer: BLUE CROSS/BLUE SHIELD | Admitting: Rheumatology

## 2016-10-26 ENCOUNTER — Encounter: Payer: Self-pay | Admitting: Neurology

## 2016-10-26 DIAGNOSIS — Z79899 Other long term (current) drug therapy: Secondary | ICD-10-CM | POA: Diagnosis not present

## 2016-10-26 DIAGNOSIS — L4050 Arthropathic psoriasis, unspecified: Secondary | ICD-10-CM | POA: Diagnosis not present

## 2016-10-26 NOTE — Progress Notes (Signed)
Office Visit Note  Patient: April Cox             Date of Birth: [birth year]           MRN: [id number]             PCP: Denita Lung, MD Referring: Denita Lung, MD Visit Date: 11/05/2016 Occupation: @GUAROCC @    Subjective:  Joint stiffness   History of Present Illness: April Cox is a 61 y.o. female with history of psoriatic arthritis, psoriasis, osteoarthritis and disc disease. She states the combination of methotrexate and Remicade is working well for her. She does have morning stiffness but not much joint discomfort. She states she has not had a recent psoriatic arthritis flare. She is some discomfort in her hands, feet and her lower back. She's been having discomfort in her right knee joint for the last 2 months. She denies any knee swelling. She has not had any recent psoriasis rash.  Activities of Daily Living:  Patient reports morning stiffness for 45 minutes.   Patient Denies nocturnal pain.  Difficulty dressing/grooming: Denies Difficulty climbing stairs: Reports Difficulty getting out of chair: Denies Difficulty using hands for taps, buttons, cutlery, and/or writing: Reports   Review of Systems  Constitutional: Positive for fatigue. Negative for night sweats, weight gain, weight loss and weakness.  HENT: Negative for mouth sores, trouble swallowing, trouble swallowing, mouth dryness and nose dryness.   Eyes: Positive for visual disturbance and dryness. Negative for pain and redness.  Respiratory: Negative for cough, shortness of breath and difficulty breathing.   Cardiovascular: Positive for hypertension. Negative for chest pain, palpitations, irregular heartbeat and swelling in legs/feet.  Gastrointestinal: Negative for blood in stool, constipation and diarrhea.  Endocrine: Negative for increased urination.  Genitourinary: Negative for vaginal dryness.  Musculoskeletal: Positive for arthralgias, joint pain and morning stiffness. Negative for joint  swelling, myalgias, muscle weakness, muscle tenderness and myalgias.  Skin: Negative for color change, rash, hair loss, skin tightness, ulcers and sensitivity to sunlight.  Allergic/Immunologic: Negative for susceptible to infections.  Neurological: Negative for dizziness, memory loss and night sweats.  Hematological: Negative for swollen glands.  Psychiatric/Behavioral: Negative for depressed mood and sleep disturbance. The patient is not nervous/anxious.     PMFS History:  Patient Active Problem List   Diagnosis Date Noted  . Shingles 05/26/2016  . Primary osteoarthritis of both hands 01/07/2016  . Spondylosis of lumbar region without myelopathy or radiculopathy 01/07/2016  . High risk medication use 01/07/2016  . Essential hypertension 09/10/2014  . History of HPV infection 09/10/2014  . History of colonic polyps 09/10/2014  . Osteoporosis 09/10/2014  . Gastroesophageal reflux disease without esophagitis 09/10/2014  . Cigarette smoker 05/11/2011  . Hyperlipidemia 08/22/2010  . Hypothyroidism 08/22/2010  . Psoriatic arthritis ([place]) 08/22/2010    Past Medical History:  Diagnosis Date  . Abnormal Pap smear of cervix   . Anemia    history of anemia  . Arthritis   . Diverticulosis of colon   . Family history of adverse reaction to anesthesia    mother PONV  . GERD (gastroesophageal reflux disease)   . Heart murmur    asymptomatic per pt  . History of adenomatous polyp of colon    11-06-2009  tubullar  . HPV in female   . Hyperlipidemia   . Hypertension   . Hypothyroidism   . Mild aortic valve stenosis    per echo 10-09-2010  ef 60-65%  valva area 1.48cm  .  Psoriasis   . Psoriatic arthritis ([place])    Dr. Estanislado Pandy  . Wears glasses     Family History  Problem Relation Age of Onset  . Arthritis Mother        OA   . Psoriasis Mother   . Other Father        accidental death/MVA at age 35  . Hypertension Brother   . Other Brother        1 brother drowned  .  Hypertension Maternal Grandmother   . Diabetes Maternal Grandmother   . Hypertension Maternal Grandfather   . Heart disease Neg Hx   . Cancer Neg Hx    Past Surgical History:  Procedure Laterality Date  . ABDOMINAL HYSTERECTOMY    . ANTERIOR LAT LUMBAR FUSION  12/25/2011   Procedure: ANTERIOR LATERAL LUMBAR FUSION 1 LEVEL;  Surgeon: Eustace Moore, MD;  Location: [place] [facility];  Service: Neurosurgery;  Laterality: Left;  LUMBAR FOUR-FIVE  . BLADDER SUSPENSION N/A 12/19/2014   Procedure: TRANSVAGINAL TAPE (TVT) PROCEDURE;  Surgeon: Everett Graff, MD;  Location: [place] [facility];  Service: Gynecology;  Laterality: N/A;  . CARPAL TUNNEL RELEASE Right April 2015  . CERVICAL CONIZATION W/BX N/A 08/28/2014   Procedure: CONIZATION CERVIX WITH BIOPSY;  Surgeon: Ena Dawley, MD;  Location: [facility];  Service: Gynecology;  Laterality: N/A;  . COLONOSCOPY  11-06-2009  . CYSTO N/A 12/19/2014   Procedure: CYSTO;  Surgeon: Everett Graff, MD;  Location: [facility];  Service: Gynecology;  Laterality: N/A;  . DENTAL SURGERY    . HYSTEROSCOPY W/D&C  04-04-2008  . KNEE ARTHROPLASTY    . LUMBAR PERCUTANEOUS PEDICLE SCREW 1 LEVEL  12/25/2011   Procedure: LUMBAR PERCUTANEOUS PEDICLE SCREW 1 LEVEL;  Surgeon: Eustace Moore, MD;  Location: [place] [facility];  Service: Neurosurgery;  Laterality: Left;  LUMBAR FOUR-FIVE  . PERONEAL NERVE DECOMPRESSION Left 12-27-2009   for neuropathy with  foot drop  . TRANSTHORACIC ECHOCARDIOGRAM  10-09-2010   mild LVH,  ef 60-65%,  grade I diastolic dysfunction,  very mild AV stenosis (area 1.48cm/S/2(VTI) with no regurg./  mild MV calcification without stenosis or regur. /  trivial TR  . VAGINAL HYSTERECTOMY Bilateral 12/19/2014   Procedure: TOTAL VAGINAL HYSTERECTOMY WITH BILATERAL SALPINGECTOMY;  Surgeon: Ena Dawley, MD;  Location: [place] [facility];  Service: Gynecology;  Laterality: Bilateral;   Social History   Social History Narrative  . No narrative on file      Objective: Vital Signs: BP (!) 144/69 (BP Location: Left Arm, Patient Position: Sitting, Cuff Size: Normal)   Pulse 78   Resp 16   Ht 5\' 5"  (1.651 m)   Wt 129 lb (58.5 kg)   BMI 21.47 kg/m    Physical Exam  Constitutional: She is oriented to person, place, and time. She appears well-developed and well-nourished.  HENT:  Head: Normocephalic and atraumatic.  Eyes: Conjunctivae and EOM are normal.  Neck: Normal range of motion.  Cardiovascular: Normal rate, regular rhythm, normal heart sounds and intact distal pulses.   Pulmonary/Chest: Effort normal and breath sounds normal.  Abdominal: Soft. Bowel sounds are normal.  Lymphadenopathy:    She has no cervical adenopathy.  Neurological: She is alert and oriented to person, place, and time.  Skin: Skin is warm and dry. Capillary refill takes less than 2 seconds.  Psychiatric: She has a normal mood and affect. Her behavior is normal.  Nursing note and vitals reviewed.    Musculoskeletal Exam: C-spine and thoracic spine good  range of motion. She is some discomfort range of motion of lumbar spine. Shoulder joints although joints are good range of motion. She has synovial thickening over bilateral PIP/DIP joints in her hands and feet but no synovitis was noted. Hip joints knee joints ankles were good range of motion. She is no warmth swelling or effusion in her right knee joint.  CDAI Exam: CDAI Homunculus Exam:   Joint Counts:  CDAI Tender Joint count: 0 CDAI Swollen Joint count: 0  Global Assessments:  Patient Global Assessment: 4 Provider Global Assessment: 2  CDAI Calculated Score: 6    Investigation: No additional findings.TB Gold: Negative in 07/2016 CBC Latest Ref Rng & Units 10/26/2016 09/07/2016 08/06/2016  WBC 3.4 - 10.8 x10E3/uL 8.8 6.4 6.4  Hemoglobin 11.1 - 15.9 g/dL 12.7 12.8 13.4  Hematocrit 34.0 - 46.6 % 37.6 38.1 39.9  Platelets 150 - 379 x10E3/uL 228 287 294   CMP Latest Ref Rng & Units 10/26/2016  09/07/2016 08/06/2016  Glucose 65 - 99 mg/dL 120(H) 90 88  BUN 8 - 27 mg/dL 4(L) 5(L) 6(L)  Creatinine 0.57 - 1.00 mg/dL 0.59 0.49(L) 0.63  Sodium 134 - 144 mmol/L 141 141 135  Potassium 3.5 - 5.2 mmol/L 3.8 4.4 4.4  Chloride 96 - 106 mmol/L 107(H) 105 102  CO2 20 - 29 mmol/L 19(L) 22 25  Calcium 8.7 - 10.3 mg/dL 9.2 9.3 9.5  Total Protein 6.0 - 8.5 g/dL 7.2 7.1 -  Total Bilirubin 0.0 - 1.2 mg/dL 0.4 0.3 -  Alkaline Phos 39 - 117 IU/L 74 84 -  AST 0 - 40 IU/L 23 23 -  ALT 0 - 32 IU/L 16 19 -    Imaging: No results found.  Speciality Comments: Remicade 6mg / kg every 6 weeks TB gold negative 08/06/16// infuses at [facility]    Procedures:  No procedures performed Allergies: Codeine; Other; Penicillins; and Gluten meal   Assessment / Plan:     Visit Diagnoses: Psoriatic arthritis [facility]): Patient has no active synovitis on examination today. She is doing well on combination of methotrexate and Remicade currently.  Other psoriasis: She has no active psoriasis lesions.  High risk medication use - Methotrexate 1.0 ml, folic acid 2mg , IV Remicade. Her labs are stable. He may consider tapering her methotrexate in future. She will continue to get labs along with her infusions.  Chronic pain of right knee: She has no warmth swelling or effusion on examination today. We will observe it for right now.  Primary osteoarthritis of both hands: Joint protection and muscle strengthening discussed.  DDD (degenerative disc disease), lumbar: She's discomfort off and on.  Age-related osteoporosis without current pathological fracture - on Fosamax started 09/2016 by her PCP.  Smoker: Smoking cessation discussed.  History of colonic polyps  History of hypertension: Her blood pressure is slightly elevated today. I've advised her to monitor it closely.  History of hypothyroidism  History of gastroesophageal reflux (GERD)    Orders: No orders of the defined types were placed in this encounter.  No  orders of the defined types were placed in this encounter.   Face-to-face time spent with patient was 30 minutes. Greater than 50% of time was spent in counseling and coordination of care.  Follow-Up Instructions: Return in about 4 months (around 03/08/2017) for PsA PS OA .   Bo Merino, MD  Note - This record has been created using Editor, commissioning.  Chart creation errors have been sought, but may not always  have been located.  Such creation errors do not reflect on  the standard of medical care.

## 2016-10-27 LAB — CBC WITH DIFFERENTIAL/PLATELET
BASOS: 0 %
Basophils Absolute: 0 10*3/uL (ref 0.0–0.2)
EOS (ABSOLUTE): 0.1 10*3/uL (ref 0.0–0.4)
EOS: 1 %
HEMATOCRIT: 37.6 % (ref 34.0–46.6)
Hemoglobin: 12.7 g/dL (ref 11.1–15.9)
Immature Grans (Abs): 0 10*3/uL (ref 0.0–0.1)
Immature Granulocytes: 0 %
LYMPHS ABS: 2.7 10*3/uL (ref 0.7–3.1)
Lymphs: 30 %
MCH: 32.7 pg (ref 26.6–33.0)
MCHC: 33.8 g/dL (ref 31.5–35.7)
MCV: 97 fL (ref 79–97)
MONOS ABS: 0.4 10*3/uL (ref 0.1–0.9)
Monocytes: 4 %
Neutrophils Absolute: 5.7 10*3/uL (ref 1.4–7.0)
Neutrophils: 65 %
Platelets: 228 10*3/uL (ref 150–379)
RBC: 3.88 x10E6/uL (ref 3.77–5.28)
RDW: 15.4 % (ref 12.3–15.4)
WBC: 8.8 10*3/uL (ref 3.4–10.8)

## 2016-10-27 LAB — COMPREHENSIVE METABOLIC PANEL
A/G RATIO: 1.5 (ref 1.2–2.2)
ALBUMIN: 4.3 g/dL (ref 3.6–4.8)
ALK PHOS: 74 IU/L (ref 39–117)
ALT: 16 IU/L (ref 0–32)
AST: 23 IU/L (ref 0–40)
BUN / CREAT RATIO: 7 — AB (ref 12–28)
BUN: 4 mg/dL — ABNORMAL LOW (ref 8–27)
Bilirubin Total: 0.4 mg/dL (ref 0.0–1.2)
CO2: 19 mmol/L — AB (ref 20–29)
Calcium: 9.2 mg/dL (ref 8.7–10.3)
Chloride: 107 mmol/L — ABNORMAL HIGH (ref 96–106)
Creatinine, Ser: 0.59 mg/dL (ref 0.57–1.00)
GFR calc Af Amer: 113 mL/min/{1.73_m2} (ref >59)
GFR, EST NON AFRICAN AMERICAN: 98 mL/min/{1.73_m2} (ref >59)
GLOBULIN, TOTAL: 2.9 g/dL (ref 1.5–4.5)
Glucose: 120 mg/dL — ABNORMAL HIGH (ref 65–99)
POTASSIUM: 3.8 mmol/L (ref 3.5–5.2)
Sodium: 141 mmol/L (ref 134–144)
Total Protein: 7.2 g/dL (ref 6.0–8.5)

## 2016-11-05 ENCOUNTER — Encounter: Payer: Self-pay | Admitting: Rheumatology

## 2016-11-05 ENCOUNTER — Encounter: Payer: Self-pay | Admitting: *Deleted

## 2016-11-05 ENCOUNTER — Encounter (INDEPENDENT_AMBULATORY_CARE_PROVIDER_SITE_OTHER): Payer: Self-pay

## 2016-11-05 ENCOUNTER — Ambulatory Visit (INDEPENDENT_AMBULATORY_CARE_PROVIDER_SITE_OTHER): Payer: BLUE CROSS/BLUE SHIELD | Admitting: Rheumatology

## 2016-11-05 VITALS — BP 144/69 | HR 78 | Resp 16 | Ht 65.0 in | Wt 129.0 lb

## 2016-11-05 DIAGNOSIS — Z79899 Other long term (current) drug therapy: Secondary | ICD-10-CM

## 2016-11-05 DIAGNOSIS — G8929 Other chronic pain: Secondary | ICD-10-CM

## 2016-11-05 DIAGNOSIS — Z8719 Personal history of other diseases of the digestive system: Secondary | ICD-10-CM

## 2016-11-05 DIAGNOSIS — Z8639 Personal history of other endocrine, nutritional and metabolic disease: Secondary | ICD-10-CM | POA: Diagnosis not present

## 2016-11-05 DIAGNOSIS — L408 Other psoriasis: Secondary | ICD-10-CM | POA: Diagnosis not present

## 2016-11-05 DIAGNOSIS — L4050 Arthropathic psoriasis, unspecified: Secondary | ICD-10-CM

## 2016-11-05 DIAGNOSIS — Z8679 Personal history of other diseases of the circulatory system: Secondary | ICD-10-CM | POA: Diagnosis not present

## 2016-11-05 DIAGNOSIS — M5136 Other intervertebral disc degeneration, lumbar region: Secondary | ICD-10-CM

## 2016-11-05 DIAGNOSIS — M19041 Primary osteoarthritis, right hand: Secondary | ICD-10-CM

## 2016-11-05 DIAGNOSIS — Z86010 Personal history of colonic polyps: Secondary | ICD-10-CM

## 2016-11-05 DIAGNOSIS — M25561 Pain in right knee: Secondary | ICD-10-CM | POA: Diagnosis not present

## 2016-11-05 DIAGNOSIS — M810 Age-related osteoporosis without current pathological fracture: Secondary | ICD-10-CM | POA: Diagnosis not present

## 2016-11-05 DIAGNOSIS — F17200 Nicotine dependence, unspecified, uncomplicated: Secondary | ICD-10-CM

## 2016-11-05 DIAGNOSIS — M19042 Primary osteoarthritis, left hand: Secondary | ICD-10-CM

## 2016-11-14 ENCOUNTER — Other Ambulatory Visit: Payer: Self-pay | Admitting: Rheumatology

## 2016-11-16 ENCOUNTER — Other Ambulatory Visit: Payer: Self-pay | Admitting: Rheumatology

## 2016-11-19 ENCOUNTER — Telehealth: Payer: Self-pay | Admitting: Rheumatology

## 2016-11-19 NOTE — Telephone Encounter (Signed)
Pharmacy calling in reference to MTX. Per patient dosage has changed, and they need confirmation. Please call to advise.

## 2016-11-20 NOTE — Telephone Encounter (Signed)
Spoke with pharmacist and advised that patient is on MTX 1.0 mL weekly.They are updating her profile.

## 2016-11-26 ENCOUNTER — Other Ambulatory Visit: Payer: Self-pay | Admitting: Rheumatology

## 2016-11-26 NOTE — Telephone Encounter (Signed)
Last Visit: 11/05/16 Next Visit: 04/01/17 Labs: 10/26/16 stable  Okay to refill per Dr. Estanislado Pandy

## 2016-11-30 ENCOUNTER — Other Ambulatory Visit: Payer: Self-pay | Admitting: Rheumatology

## 2016-11-30 NOTE — Telephone Encounter (Addendum)
Prescription was sen in on 11/26/16

## 2016-12-02 ENCOUNTER — Telehealth: Payer: Self-pay

## 2016-12-02 ENCOUNTER — Other Ambulatory Visit: Payer: Self-pay

## 2016-12-02 ENCOUNTER — Emergency Department (HOSPITAL_COMMUNITY): Payer: BLUE CROSS/BLUE SHIELD

## 2016-12-02 ENCOUNTER — Encounter (HOSPITAL_COMMUNITY): Payer: Self-pay | Admitting: Emergency Medicine

## 2016-12-02 DIAGNOSIS — M25461 Effusion, right knee: Secondary | ICD-10-CM | POA: Diagnosis not present

## 2016-12-02 DIAGNOSIS — Z5321 Procedure and treatment not carried out due to patient leaving prior to being seen by health care provider: Secondary | ICD-10-CM | POA: Diagnosis not present

## 2016-12-02 MED ORDER — METHOTREXATE SODIUM CHEMO INJECTION 250 MG/10ML: INTRAMUSCULAR | 0 refills | Status: DC

## 2016-12-02 NOTE — Telephone Encounter (Signed)
Patient called back stating that pharmacy did receive Rx for MTX , but Rx has the incorrect dosage.  Patient stated that Dr. Estanislado Pandy increased her dosage to 1.0 for MTX.  Please send new Rx with correct dosage to [name] [address].  Thank You.  Cb# is [phone].

## 2016-12-02 NOTE — Addendum Note (Signed)
Addended by: Earnestine Mealing on: 12/02/2016 02:18 PM   Modules accepted: Orders

## 2016-12-02 NOTE — ED Triage Notes (Signed)
Pt states she was walking at work today and felt a twinge in her right knee  Pt states about 15 minutes later she got a severe pain in her knee and it has been like that ever since  Pt states it is painful to move her knee

## 2016-12-02 NOTE — Telephone Encounter (Signed)
Correct prescription for MTX 1.0 was sent to the pharmacy. Patient notified.

## 2016-12-02 NOTE — Telephone Encounter (Signed)
Patient called concerning her MTX refill.  Rx was sent to pharmacy on 11/26/16 to [name] [address].  Patient would like for Rx to be sent again.  Cb# [phone].  Please advise.  Thank You.

## 2016-12-03 ENCOUNTER — Emergency Department (HOSPITAL_COMMUNITY)
Admission: EM | Admit: 2016-12-03 | Discharge: 2016-12-03 | Disposition: A | Discharge status: Home / Self Care | Payer: BLUE CROSS/BLUE SHIELD | Attending: Emergency Medicine | Admitting: Emergency Medicine

## 2016-12-03 ENCOUNTER — Ambulatory Visit (INDEPENDENT_AMBULATORY_CARE_PROVIDER_SITE_OTHER): Payer: BLUE CROSS/BLUE SHIELD | Admitting: Rheumatology

## 2016-12-03 ENCOUNTER — Telehealth: Payer: Self-pay | Admitting: Rheumatology

## 2016-12-03 ENCOUNTER — Encounter: Payer: Self-pay | Admitting: Rheumatology

## 2016-12-03 VITALS — BP 118/62 | HR 60 | Resp 16 | Ht 65.0 in | Wt 127.0 lb

## 2016-12-03 DIAGNOSIS — L409 Psoriasis, unspecified: Secondary | ICD-10-CM

## 2016-12-03 DIAGNOSIS — Z8679 Personal history of other diseases of the circulatory system: Secondary | ICD-10-CM | POA: Diagnosis not present

## 2016-12-03 DIAGNOSIS — M19042 Primary osteoarthritis, left hand: Secondary | ICD-10-CM

## 2016-12-03 DIAGNOSIS — M19041 Primary osteoarthritis, right hand: Secondary | ICD-10-CM

## 2016-12-03 DIAGNOSIS — M810 Age-related osteoporosis without current pathological fracture: Secondary | ICD-10-CM

## 2016-12-03 DIAGNOSIS — F17200 Nicotine dependence, unspecified, uncomplicated: Secondary | ICD-10-CM | POA: Diagnosis not present

## 2016-12-03 DIAGNOSIS — Z8639 Personal history of other endocrine, nutritional and metabolic disease: Secondary | ICD-10-CM | POA: Diagnosis not present

## 2016-12-03 DIAGNOSIS — L4050 Arthropathic psoriasis, unspecified: Secondary | ICD-10-CM | POA: Diagnosis not present

## 2016-12-03 DIAGNOSIS — G8929 Other chronic pain: Secondary | ICD-10-CM

## 2016-12-03 DIAGNOSIS — M5136 Other intervertebral disc degeneration, lumbar region: Secondary | ICD-10-CM | POA: Diagnosis not present

## 2016-12-03 DIAGNOSIS — Z8719 Personal history of other diseases of the digestive system: Secondary | ICD-10-CM

## 2016-12-03 DIAGNOSIS — Z860100 Personal history of colon polyps, unspecified: Secondary | ICD-10-CM

## 2016-12-03 DIAGNOSIS — M25561 Pain in right knee: Secondary | ICD-10-CM | POA: Diagnosis not present

## 2016-12-03 DIAGNOSIS — M51369 Other intervertebral disc degeneration, lumbar region without mention of lumbar back pain or lower extremity pain: Secondary | ICD-10-CM

## 2016-12-03 DIAGNOSIS — Z79899 Other long term (current) drug therapy: Secondary | ICD-10-CM | POA: Diagnosis not present

## 2016-12-03 MED ORDER — PREDNISONE 5 MG PO TABS: ORAL_TABLET | ORAL | 0 refills | Status: DC

## 2016-12-03 NOTE — Telephone Encounter (Signed)
Spoke with patient and schedule her for 12/03/16 at 2:15 pm

## 2016-12-03 NOTE — Telephone Encounter (Signed)
Patient states knee started hurting about 6:15 last night, went to ER with Xrays. Now rt hand, and wrist swollen. Patient wants to be seen today, but not sure if she should see Dr. Durward Fortes, or Dr. Estanislado Pandy. Please call to discuss, and advise.

## 2016-12-03 NOTE — Progress Notes (Signed)
Office Visit Note  Patient: April Cox             Date of Birth: 1955/07/16           MRN: [id number]             PCP: Denita Lung, MD Referring: Denita Lung, MD Visit Date: 12/03/2016 Occupation: @GUAROCC @    Subjective:  Other (right knee pain, right hand/arm swelling and stiffness )   History of Present Illness: April Cox is a 61 y.o. female with history of psoriatic arthritis and psoriasis. She had been doing very well so far. She states yesterday evening she had sudden pain in her right knee joint. She states she was having difficulty walking.she went to the emergency room where she had x-ray of the knee joint but could not wait long enough to see the physician as the wait was too long. She denies any swelling in her right knee which continues to have discomfort. She states she has noticed swelling in her right hand today. None of the other joints are painful. She is supposed to get her next Remicade infusion on November 19.  Activities of Daily Living:  Patient reports morning stiffness for 30 minutes.   Patient Denies nocturnal pain.  Difficulty dressing/grooming: Denies Difficulty climbing stairs: Reports Difficulty getting out of chair: Reports Difficulty using hands for taps, buttons, cutlery, and/or writing: Reports   Review of Systems  Constitutional: Positive for fatigue. Negative for night sweats, weight gain, weight loss and weakness.  HENT: Negative for mouth sores, trouble swallowing, trouble swallowing, mouth dryness and nose dryness.   Eyes: Positive for dryness. Negative for pain, redness and visual disturbance.  Respiratory: Negative for cough, shortness of breath and difficulty breathing.   Cardiovascular: Positive for irregular heartbeat. Negative for chest pain, palpitations, hypertension and swelling in legs/feet.  Gastrointestinal: Negative for blood in stool, constipation and diarrhea.  Endocrine: Negative for increased urination.    Genitourinary: Negative for vaginal dryness.  Musculoskeletal: Positive for arthralgias, joint pain, joint swelling and morning stiffness. Negative for myalgias, muscle weakness, muscle tenderness and myalgias.  Skin: Negative for color change, rash, hair loss, skin tightness, ulcers and sensitivity to sunlight.  Allergic/Immunologic: Negative for susceptible to infections.  Neurological: Negative for dizziness, memory loss and night sweats.  Hematological: Negative for swollen glands.  Psychiatric/Behavioral: Negative for depressed mood and sleep disturbance. The patient is not nervous/anxious.     PMFS History:  Patient Active Problem List   Diagnosis Date Noted  . Shingles 05/26/2016  . Primary osteoarthritis of both hands 01/07/2016  . Spondylosis of lumbar region without myelopathy or radiculopathy 01/07/2016  . High risk medication use 01/07/2016  . Essential hypertension 09/10/2014  . History of HPV infection 09/10/2014  . History of colonic polyps 09/10/2014  . Osteoporosis 09/10/2014  . Gastroesophageal reflux disease without esophagitis 09/10/2014  . Cigarette smoker 05/11/2011  . Hyperlipidemia 08/22/2010  . Hypothyroidism 08/22/2010  . Psoriatic arthritis ([place]) 08/22/2010    Past Medical History:  Diagnosis Date  . Abnormal Pap smear of cervix   . Anemia    history of anemia  . Arthritis   . Diverticulosis of colon   . Family history of adverse reaction to anesthesia    mother PONV  . GERD (gastroesophageal reflux disease)   . Heart murmur    asymptomatic per pt  . History of adenomatous polyp of colon    11-06-2009  tubullar  . HPV in female   .  Hyperlipidemia   . Hypertension   . Hypothyroidism   . Mild aortic valve stenosis    per echo 10-09-2010  ef 60-65%  valva area 1.48cm  . Psoriasis   . Psoriatic arthritis ([place])    Dr. Estanislado Pandy  . Wears glasses     Family History  Problem Relation Age of Onset  . Arthritis Mother        OA   . Psoriasis  Mother   . Other Father        accidental death/MVA at age 77  . Hypertension Brother   . Other Brother        1 brother drowned  . Hypertension Maternal Grandmother   . Diabetes Maternal Grandmother   . Hypertension Maternal Grandfather   . Heart disease Neg Hx   . Cancer Neg Hx    Past Surgical History:  Procedure Laterality Date  . ABDOMINAL HYSTERECTOMY    . ANTERIOR LAT LUMBAR FUSION  12/25/2011   Procedure: ANTERIOR LATERAL LUMBAR FUSION 1 LEVEL;  Surgeon: Eustace Moore, MD;  Location: [facility];  Service: Neurosurgery;  Laterality: Left;  LUMBAR FOUR-FIVE  . BLADDER SUSPENSION N/A 12/19/2014   Procedure: TRANSVAGINAL TAPE (TVT) PROCEDURE;  Surgeon: Everett Graff, MD;  Location: [facility];  Service: Gynecology;  Laterality: N/A;  . CARPAL TUNNEL RELEASE Right April 2015  . CERVICAL CONIZATION W/BX N/A 08/28/2014   Procedure: CONIZATION CERVIX WITH BIOPSY;  Surgeon: Ena Dawley, MD;  [facility];  Service: Gynecology;  Laterality: N/A;  . COLONOSCOPY  11-06-2009  . CYSTO N/A 12/19/2014   Procedure: CYSTO;  Surgeon: Everett Graff, MD;  Location: [facility];  Service: Gynecology;  Laterality: N/A;  . DENTAL SURGERY    . HYSTEROSCOPY W/D&C  04-04-2008  . KNEE ARTHROPLASTY    . LUMBAR PERCUTANEOUS PEDICLE SCREW 1 LEVEL  12/25/2011   Procedure: LUMBAR PERCUTANEOUS PEDICLE SCREW 1 LEVEL;  Surgeon: Eustace Moore, MD;  Location: [facility];  Service: Neurosurgery;  Laterality: Left;  LUMBAR FOUR-FIVE  . PERONEAL NERVE DECOMPRESSION Left 12-27-2009   for neuropathy with  foot drop  . TRANSTHORACIC ECHOCARDIOGRAM  10-09-2010   mild LVH,  ef 60-65%,  grade I diastolic dysfunction,  very mild AV stenosis (area 1.48cm/S/2(VTI) with no regurg./  mild MV calcification without stenosis or regur. /  trivial TR  . VAGINAL HYSTERECTOMY Bilateral 12/19/2014   Procedure: TOTAL VAGINAL HYSTERECTOMY WITH BILATERAL SALPINGECTOMY;  Surgeon: Ena Dawley, MD;  Location: [facility];  Service: Gynecology;  Laterality: Bilateral;   Social History   Social History Narrative  . Not on file     Objective: Vital Signs: BP 118/62 (BP Location: Left Arm, Patient Position: Sitting, Cuff Size: Normal)   Pulse 60   Resp 16   Ht 5\' 5"  (1.651 m)   Wt 127 lb (57.6 kg)   BMI 21.13 kg/m    Physical Exam  Constitutional: She is oriented to person, place, and time. She appears well-developed and well-nourished.  HENT:  Head: Normocephalic and atraumatic.  Eyes: Conjunctivae and EOM are normal.  Neck: Normal range of motion.  Cardiovascular: Normal rate, regular rhythm, normal heart sounds and intact distal pulses.  Pulmonary/Chest: Effort normal and breath sounds normal.  Abdominal: Soft. Bowel sounds are normal.  Lymphadenopathy:    She has no cervical adenopathy.  Neurological: She is alert and oriented to person, place, and time.  Skin: Skin is warm and dry. Capillary refill takes less than 2 seconds.  Psychiatric: She has a normal mood and affect. Her behavior is normal.  Nursing note and vitals reviewed.    Musculoskeletal Exam: C-spine and thoracic lumbar spine good range of motion. Shoulder joints elbow joints are good range of motion. She had tenderness on palpation of her right wrist and right third PIP joint.she has DIP PIP thickening in her hands but no active synovitis was noted. She also had swelling on the dorsum of her right hand. Hip joints were good range of motion. She had some warmth and tenderness on palpation of her right knee joint. No effusion was palpable.  CDAI Exam: CDAI Homunculus Exam:   Tenderness:  RUE: wrist Right hand: 3rd PIP RLE: tibiofemoral  Swelling:  RUE: wrist Right hand: 3rd PIP RLE: tibiofemoral  Joint Counts:  CDAI Tender Joint count: 3 CDAI Swollen Joint count: 3  Global Assessments:  Patient Global Assessment: 10 Provider Global Assessment: 7  CDAI Calculated Score: 23    Investigation: No additional  findings. CBC Latest Ref Rng & Units 10/26/2016 09/07/2016 08/06/2016  WBC 3.4 - 10.8 x10E3/uL 8.8 6.4 6.4  Hemoglobin 11.1 - 15.9 g/dL 12.7 12.8 13.4  Hematocrit 34.0 - 46.6 % 37.6 38.1 39.9  Platelets 150 - 379 x10E3/uL 228 287 294   CMP Latest Ref Rng & Units 10/26/2016 09/07/2016 08/06/2016  Glucose 65 - 99 mg/dL 120(H) 90 88  BUN 8 - 27 mg/dL 4(L) 5(L) 6(L)  Creatinine 0.57 - 1.00 mg/dL 0.59 0.49(L) 0.63  Sodium 134 - 144 mmol/L 141 141 135  Potassium 3.5 - 5.2 mmol/L 3.8 4.4 4.4  Chloride 96 - 106 mmol/L 107(H) 105 102  CO2 20 - 29 mmol/L 19(L) 22 25  Calcium 8.7 - 10.3 mg/dL 9.2 9.3 9.5  Total Protein 6.0 - 8.5 g/dL 7.2 7.1 -  Total Bilirubin 0.0 - 1.2 mg/dL 0.4 0.3 -  Alkaline Phos 39 - 117 IU/L 74 84 -  AST 0 - 40 IU/L 23 23 -  ALT 0 - 32 IU/L 16 19 -    Imaging: Dg Knee Complete 4 Views Right  Result Date: 12/02/2016 CLINICAL DATA:  Knee pain EXAM: RIGHT KNEE - COMPLETE 4+ VIEW COMPARISON:  None. FINDINGS: No fracture or dislocation. There is a medium-sized suprapatellar knee joint effusion. No evidence of arthropathy or other focal bone abnormality. Soft tissues are unremarkable. IMPRESSION: Medium-sized suprapatellar joint effusion without acute osseous abnormality. Electronically Signed   By: Ulyses Jarred M.D.   On: 12/02/2016 21:34    Speciality Comments: Remicade 6mg / kg every 6 weeks TB gold negative 08/06/16// infuses at [facility]    Procedures:  Large Joint Inj: R knee on 12/03/2016 3:30 PM Indications: pain and joint swelling Details: 27 G 1.5 in needle, medial approach  Arthrogram: No  Medications: 1.5 mL lidocaine 1 %; 40 mg triamcinolone acetonide 40 MG/ML Aspirate: 0 mL Outcome: tolerated well, no immediate complications Procedure, treatment alternatives, risks and benefits explained, specific risks discussed. Consent was given by the patient. Immediately prior to procedure a time out was called to verify the correct patient, procedure, equipment, support staff  and site/side marked as required. Patient was prepped and draped in the usual sterile fashion.     Allergies: Codeine; Other; Penicillins; and Gluten meal   Assessment / Plan:     Visit Diagnoses: Psoriatic arthritis ([place]): For psoriatic arthritis control except for some knee joint discomfort.  Psoriasis: Patient has no active lesions of psoriasis.  Right knee pain: Different treatment options were discussed. Per patient's  request the right knee joint was prepped and stroke fashion and injected with cortisone and lidocaine which she tolerated well.  High risk medication use - Methotrexate 1.0 ml, folic acid 2mg , IV Remicade 6 mg/kg every 6 weeks which she is tolerating well. A been monitoring her lab work which is been stable. She gets lab work with her infusions.  Primary osteoarthritis of both hands: It causes some discomfort.  DDD (degenerative disc disease), lumbar: She is some chronic lower back pain.  Age-related osteoporosis without current pathological fracture - on Fosamax started 09/2016 by her PCP. She will be getting follow-up [place] with her PCP.  Smoker  History of hypertension: Blood pressure is well controlled today.  History of hypothyroidism  History of colonic polyps  History of gastroesophageal reflux (GERD)    Orders: No orders of the defined types were placed in this encounter.  No orders of the defined types were placed in this encounter.   Face-to-face time spent with patient was 30 minutes. Greater than 50% of time was spent in counseling and coordination of care.  Follow-Up Instructions: No Follow-up on file.   Bo Merino, MD  Note - This record has been created using Editor, commissioning.  Chart creation errors have been sought, but may not always  have been located. Such creation errors do not reflect on  the standard of medical care.

## 2016-12-07 ENCOUNTER — Encounter: Payer: Self-pay | Admitting: Neurology

## 2016-12-07 ENCOUNTER — Other Ambulatory Visit: Payer: Self-pay | Admitting: *Deleted

## 2016-12-07 DIAGNOSIS — Z79899 Other long term (current) drug therapy: Secondary | ICD-10-CM

## 2016-12-07 DIAGNOSIS — L4050 Arthropathic psoriasis, unspecified: Secondary | ICD-10-CM

## 2016-12-07 NOTE — Progress Notes (Signed)
Uric acid level added/fim

## 2016-12-08 LAB — CBC WITH DIFFERENTIAL/PLATELET
BASOS: 0 %
Basophils Absolute: 0 10*3/uL (ref 0.0–0.2)
EOS (ABSOLUTE): 0 10*3/uL (ref 0.0–0.4)
EOS: 0 %
HEMOGLOBIN: 12.8 g/dL (ref 11.1–15.9)
Hematocrit: 37.6 % (ref 34.0–46.6)
IMMATURE GRANS (ABS): 0 10*3/uL (ref 0.0–0.1)
IMMATURE GRANULOCYTES: 0 %
LYMPHS: 31 %
Lymphocytes Absolute: 3.6 10*3/uL — ABNORMAL HIGH (ref 0.7–3.1)
MCH: 33.2 pg — ABNORMAL HIGH (ref 26.6–33.0)
MCHC: 34 g/dL (ref 31.5–35.7)
MCV: 97 fL (ref 79–97)
MONOCYTES: 3 %
Monocytes Absolute: 0.3 10*3/uL (ref 0.1–0.9)
NEUTROS PCT: 66 %
Neutrophils Absolute: 7.5 10*3/uL — ABNORMAL HIGH (ref 1.4–7.0)
PLATELETS: 364 10*3/uL (ref 150–379)
RBC: 3.86 x10E6/uL (ref 3.77–5.28)
RDW: 14.2 % (ref 12.3–15.4)
WBC: 11.5 10*3/uL — ABNORMAL HIGH (ref 3.4–10.8)

## 2016-12-08 LAB — COMPREHENSIVE METABOLIC PANEL
ALT: 19 IU/L (ref 0–32)
AST: 19 IU/L (ref 0–40)
Albumin/Globulin Ratio: 1.5 (ref 1.2–2.2)
Albumin: 4.4 g/dL (ref 3.6–4.8)
Alkaline Phosphatase: 76 IU/L (ref 39–117)
BUN/Creatinine Ratio: 13 (ref 12–28)
BUN: 8 mg/dL (ref 8–27)
Bilirubin Total: 0.5 mg/dL (ref 0.0–1.2)
CALCIUM: 9.5 mg/dL (ref 8.7–10.3)
CO2: 23 mmol/L (ref 20–29)
CREATININE: 0.6 mg/dL (ref 0.57–1.00)
Chloride: 101 mmol/L (ref 96–106)
GFR calc Af Amer: 112 mL/min/{1.73_m2} (ref >59)
GFR, EST NON AFRICAN AMERICAN: 97 mL/min/{1.73_m2} (ref >59)
GLUCOSE: 94 mg/dL (ref 65–99)
Globulin, Total: 3 g/dL (ref 1.5–4.5)
POTASSIUM: 3.9 mmol/L (ref 3.5–5.2)
Sodium: 140 mmol/L (ref 134–144)
TOTAL PROTEIN: 7.4 g/dL (ref 6.0–8.5)

## 2016-12-08 LAB — URIC ACID: Uric Acid: 3.1 mg/dL (ref 2.5–7.1)

## 2016-12-17 ENCOUNTER — Encounter: Payer: Self-pay | Admitting: Rheumatology

## 2016-12-17 MED ORDER — LIDOCAINE HCL 1 % IJ SOLN
1.5000 mL | INTRAMUSCULAR | Status: AC | PRN
  Administered 2016-12-03: 1.5 mL

## 2016-12-17 MED ORDER — TRIAMCINOLONE ACETONIDE 40 MG/ML IJ SUSP
40.0000 mg | INTRAMUSCULAR | Status: AC | PRN
  Administered 2016-12-03: 40 mg via INTRA_ARTICULAR

## 2016-12-22 ENCOUNTER — Other Ambulatory Visit: Payer: Self-pay | Admitting: Rheumatology

## 2016-12-22 NOTE — Telephone Encounter (Signed)
Last Visit: 12/03/16 Next Visit: 04/01/17  Okay to refill per Dr. Estanislado Pandy

## 2017-01-01 ENCOUNTER — Telehealth: Payer: Self-pay | Admitting: Rheumatology

## 2017-01-01 ENCOUNTER — Ambulatory Visit (INDEPENDENT_AMBULATORY_CARE_PROVIDER_SITE_OTHER): Payer: BLUE CROSS/BLUE SHIELD | Admitting: Medical

## 2017-01-01 ENCOUNTER — Encounter: Payer: Self-pay | Admitting: Medical

## 2017-01-01 VITALS — BP 120/76 | HR 77 | Wt 125.8 lb

## 2017-01-01 DIAGNOSIS — Z7189 Other specified counseling: Secondary | ICD-10-CM

## 2017-01-01 DIAGNOSIS — Z7185 Encounter for immunization safety counseling: Secondary | ICD-10-CM | POA: Insufficient documentation

## 2017-01-01 DIAGNOSIS — H04129 Dry eye syndrome of unspecified lacrimal gland: Secondary | ICD-10-CM | POA: Diagnosis not present

## 2017-01-01 DIAGNOSIS — L4050 Arthropathic psoriasis, unspecified: Secondary | ICD-10-CM

## 2017-01-01 DIAGNOSIS — L98491 Non-pressure chronic ulcer of skin of other sites limited to breakdown of skin: Secondary | ICD-10-CM | POA: Diagnosis not present

## 2017-01-01 MED ORDER — MUPIROCIN 2 % EX OINT: 1.0000 "application " | TOPICAL_OINTMENT | Freq: Three times a day (TID) | CUTANEOUS | 1 refills | Status: DC

## 2017-01-01 NOTE — Progress Notes (Signed)
Subjective: Chief Complaint  Patient presents with  . possible infection on finger    knot on finger , has gotten bigger over the last couple.some drainage    Here for possible finger infection.  She has a history of psoriatic arthritis.  She is on medications including methotrexate and Remicade.  She has history of sores on her knuckles that become ulcerated or drained periodically.  Recently however she had a particularly red and swollen and tender sore on her right distal phalanx of the middle finger.  She has never had one quite as tender or swollen.  Her rheumatologist, Dr. Estanislado Cox for advised her to not let one get infected.  She is using over-the-counter Neosporin without improvement.    She has questions about the ongoing eye dryness and swelling of eyelid at times.  Her rheumatologist recommended she get an ophthalmologist, requesting recommendations.  She has questions about the shingles vaccine.  Her rheumatologist also recommended she have the updated shingles vaccine.  She did have a shingles outbreak in May of this year.  Past Medical History:  Diagnosis Date  . Abnormal Pap smear of cervix   . Anemia    history of anemia  . Arthritis   . Diverticulosis of colon   . Family history of adverse reaction to anesthesia    mother PONV  . GERD (gastroesophageal reflux disease)   . Heart murmur    asymptomatic per pt  . History of adenomatous polyp of colon    11-06-2009  tubullar  . HPV in female   . Hyperlipidemia   . Hypertension   . Hypothyroidism   . Mild aortic valve stenosis    per echo 10-09-2010  ef 60-65%  valva area 1.48cm  . Psoriasis   . Psoriatic arthritis ([place])    Dr. Estanislado Cox  . Wears glasses     Current Outpatient Medications on File Prior to Visit  Medication Sig Dispense Refill  . alendronate (FOSAMAX) 70 MG tablet Take 1 tablet (70 mg total) by mouth every 7 (seven) days. Take with a full glass of water on an empty stomach. 12 tablet 3  .  atorvastatin (LIPITOR) 40 MG tablet Take 1 tablet (40 mg total) by mouth every morning. 90 tablet 3  . calcium-vitamin D (OSCAL WITH D) 500-200 MG-UNIT per tablet Take 1 tablet by mouth 2 (two) times daily.      . Cholecalciferol (VITAMIN D PO) Take 500 tablets by mouth daily.     Marland Kitchen EPINEPHrine (EPIPEN 2-PAK) 0.3 mg/0.3 mL IJ SOAJ injection INJECT 0.3 MLS (0.3 MG TOTAL) INTO THE MUSCLE ONCE. 1 Device 2  . erythromycin (ROMYCIN) ophthalmic ointment Place 1 application into the left eye 3 (three) times daily. 3.5 g 0  . folic acid (FOLVITE) 1 MG tablet TAKE 2 TABLETS BY MOUTH DAILY 60 tablet 9  . inFLIXimab (REMICADE) 100 MG injection Inject 6 mg/kg into the vein every 6 (six) weeks. Infuse 400mg  (4 vials) in 227mL NS over 2 hours every 6 weeks    . levothyroxine (SYNTHROID) 25 MCG tablet Take 1 tablet (25 mcg total) by mouth daily before breakfast. 90 tablet 3  . methotrexate 250 MG/10ML injection INJECT 1.0 MILLILITERS (CC) ONCE A WEEK AS DRIECTED 12 mL 0  . metoprolol succinate (TOPROL-XL) 50 MG 24 hr tablet TAKE ONE TABLET BY MOUTH DAILY WITH OR IMMEDIATELY FOLLOWING A MEAL 90 tablet 3  . MONOJECT 1CC TB SYR 27GX1/2" 27G X 1/2" 1 ML MISC AS DIRECTED 12 each 2  .  pantoprazole (PROTONIX) 40 MG tablet TAKE 1 TABLET BY MOUTH DAILY 90 tablet 2  . vitamin C (ASCORBIC ACID) 250 MG tablet Take 250 mg by mouth daily.      . clobetasol ointment (TEMOVATE) 5.63 % Apply 1 application topically as needed (psiorisis).   0  . predniSONE (DELTASONE) 5 MG tablet Take 4 tabs for 4 days, then 3 tabs for 4 days, then 2 tabs for 4 days then 1 tab for 4 days. (Patient not taking: Reported on 01/01/2017) 40 tablet 0   No current facility-administered medications on file prior to visit.     ROS as in subjective  Objective: BP 120/76   Pulse 77   Wt 125 lb 12.8 oz (57.1 kg)   SpO2 98%   BMI 20.93 kg/m   Gen: wd, wn, nad Eyes without obvious deformity, PERRLA, EOMI.  No erythema Bilateral hands with obvious  bony arthritic deformities of the PIP and DIPs.  Her right third finger distal phalanx dorsally at the DIP with swollen tender area with central ulceration but no induration, no fluctuance, no drainage. Hand's neurovascularly intact otherwise    Assessment: Encounter Diagnoses  Name Primary?  . Psoriatic arthritis ([place]) Yes  . Skin ulcer of finger, limited to breakdown of skin ([place])   . Dry eye   . Vaccine counseling      Plan: Recommended handwashing in good hygiene, begin topical antibiotic below for finger ulceration.  If worse signs of infection such as worse redness, worse pain, fever then call or return.  Her symptoms are complicated by Immunosuppressive medication, history of psoriatic arthritis  And that she contact her insurance about coverage for the shingles vaccine.  She can return here for the shingles series   Advised she consult with [facility] ophthalmology  April Cox was seen today for possible infection on finger.  Diagnoses and all orders for this visit:  Psoriatic arthritis ([place])  Skin ulcer of finger, limited to breakdown of skin ([place])  Dry eye  Vaccine counseling  Other orders -     mupirocin ointment (BACTROBAN) 2 %; Apply 1 application topically 3 (three) times daily.

## 2017-01-01 NOTE — Telephone Encounter (Signed)
Attempted to contact the patient and left message for patient to call the office.  

## 2017-01-01 NOTE — Telephone Encounter (Signed)
Patient calling in reference to a arthritis "knot" that has devoloped on her middle finger on rt hand. Patient question infection. Request a call back to discuss. [facility] uses [name] [address].

## 2017-01-01 NOTE — Telephone Encounter (Signed)
Patient states she is having a "knot/blister" that has formed on the third finger on her right hand. Patient states this area is red and puffy as well as warm to the touch. Patient states it has a scab as if it has opened up and closed. Patient states she has had you mention an ATB cream before to put on her hands. Patient is concerned if it may be getting infected.

## 2017-01-01 NOTE — Telephone Encounter (Signed)
Please advise her to see her PCP for evaluation of infection.

## 2017-01-01 NOTE — Telephone Encounter (Signed)
Patient advised to go see PCP for evaluation for infection. Patient verbalized understanding.

## 2017-01-06 ENCOUNTER — Telehealth: Payer: Self-pay | Admitting: Medical

## 2017-01-06 NOTE — Telephone Encounter (Signed)
Pt states hands got some better but are now infected again. Pt states she was told by shane if that happened he would want to place her on a oral antibiotic. Pt uses [facility].

## 2017-01-07 ENCOUNTER — Other Ambulatory Visit: Payer: Self-pay | Admitting: Medical

## 2017-01-07 MED ORDER — DOXYCYCLINE HYCLATE 100 MG PO TABS: 100.0000 mg | ORAL_TABLET | Freq: Two times a day (BID) | ORAL | 1 refills | Status: DC

## 2017-01-07 NOTE — Telephone Encounter (Signed)
Called and l/m for on her voicemail about this

## 2017-01-07 NOTE — Telephone Encounter (Signed)
I sent Doxycycline to pharmacy. Lets go 1 tablet BID for 5-7 days.   If not much improved within that time frame, then call back

## 2017-01-14 ENCOUNTER — Other Ambulatory Visit: Payer: Self-pay | Admitting: Family Medicine

## 2017-01-14 DIAGNOSIS — I10 Essential (primary) hypertension: Secondary | ICD-10-CM

## 2017-01-14 NOTE — Telephone Encounter (Signed)
LM pt due for f/u. Victor[place] December

## 2017-01-26 ENCOUNTER — Telehealth: Payer: Self-pay | Admitting: Rheumatology

## 2017-01-26 ENCOUNTER — Telehealth: Payer: Self-pay | Admitting: Family Medicine

## 2017-01-26 NOTE — Telephone Encounter (Signed)
Patient requesting ov notes to be sent to [facility]. Fax# [phone] Per patient Dr. Estanislado Pandy recommended patient seeing an eye doctor.

## 2017-01-26 NOTE — Telephone Encounter (Signed)
Pt states Dr. Herbert Deaner her Eye doctor needs medical records faxed to t# [phone], these were faxed per pt's request

## 2017-01-26 NOTE — Telephone Encounter (Signed)
Last Office Visit faxed.

## 2017-01-28 DIAGNOSIS — H04123 Dry eye syndrome of bilateral lacrimal glands: Secondary | ICD-10-CM | POA: Diagnosis not present

## 2017-01-28 DIAGNOSIS — H2513 Age-related nuclear cataract, bilateral: Secondary | ICD-10-CM | POA: Diagnosis not present

## 2017-01-28 DIAGNOSIS — H3509 Other intraretinal microvascular abnormalities: Secondary | ICD-10-CM | POA: Diagnosis not present

## 2017-01-28 DIAGNOSIS — H25013 Cortical age-related cataract, bilateral: Secondary | ICD-10-CM | POA: Diagnosis not present

## 2017-01-28 DIAGNOSIS — H524 Presbyopia: Secondary | ICD-10-CM | POA: Diagnosis not present

## 2017-02-01 ENCOUNTER — Encounter: Payer: Self-pay | Admitting: Neurology

## 2017-02-01 DIAGNOSIS — L4050 Arthropathic psoriasis, unspecified: Secondary | ICD-10-CM | POA: Diagnosis not present

## 2017-02-01 DIAGNOSIS — Z79899 Other long term (current) drug therapy: Secondary | ICD-10-CM | POA: Diagnosis not present

## 2017-02-01 NOTE — Addendum Note (Signed)
Addended by: Inis Sizer D on: 02/01/2017 01:34 PM   Modules accepted: Orders

## 2017-02-02 LAB — COMPREHENSIVE METABOLIC PANEL
ALBUMIN: 4.5 g/dL (ref 3.6–4.8)
ALT: 14 IU/L (ref 0–32)
AST: 16 IU/L (ref 0–40)
Albumin/Globulin Ratio: 1.6 (ref 1.2–2.2)
Alkaline Phosphatase: 75 IU/L (ref 39–117)
BUN / CREAT RATIO: 8 — AB (ref 12–28)
BUN: 5 mg/dL — AB (ref 8–27)
Bilirubin Total: 0.6 mg/dL (ref 0.0–1.2)
CALCIUM: 9.4 mg/dL (ref 8.7–10.3)
CO2: 23 mmol/L (ref 20–29)
CREATININE: 0.65 mg/dL (ref 0.57–1.00)
Chloride: 103 mmol/L (ref 96–106)
GFR, EST AFRICAN AMERICAN: 109 mL/min/{1.73_m2} (ref >59)
GFR, EST NON AFRICAN AMERICAN: 95 mL/min/{1.73_m2} (ref >59)
GLOBULIN, TOTAL: 2.9 g/dL (ref 1.5–4.5)
GLUCOSE: 96 mg/dL (ref 65–99)
Potassium: 4 mmol/L (ref 3.5–5.2)
SODIUM: 140 mmol/L (ref 134–144)
TOTAL PROTEIN: 7.4 g/dL (ref 6.0–8.5)

## 2017-02-02 LAB — CBC WITH DIFFERENTIAL/PLATELET
BASOS: 0 %
Basophils Absolute: 0 10*3/uL (ref 0.0–0.2)
EOS (ABSOLUTE): 0.1 10*3/uL (ref 0.0–0.4)
EOS: 1 %
HEMATOCRIT: 39 % (ref 34.0–46.6)
HEMOGLOBIN: 13.1 g/dL (ref 11.1–15.9)
Immature Grans (Abs): 0 10*3/uL (ref 0.0–0.1)
Immature Granulocytes: 0 %
LYMPHS ABS: 2.4 10*3/uL (ref 0.7–3.1)
Lymphs: 35 %
MCH: 32.9 pg (ref 26.6–33.0)
MCHC: 33.6 g/dL (ref 31.5–35.7)
MCV: 98 fL — AB (ref 79–97)
MONOCYTES: 6 %
Monocytes Absolute: 0.4 10*3/uL (ref 0.1–0.9)
NEUTROS ABS: 4 10*3/uL (ref 1.4–7.0)
Neutrophils: 58 %
Platelets: 347 10*3/uL (ref 150–379)
RBC: 3.98 x10E6/uL (ref 3.77–5.28)
RDW: 13.9 % (ref 12.3–15.4)
WBC: 6.9 10*3/uL (ref 3.4–10.8)

## 2017-02-04 ENCOUNTER — Other Ambulatory Visit: Payer: Self-pay | Admitting: Rheumatology

## 2017-02-04 ENCOUNTER — Other Ambulatory Visit: Payer: Self-pay | Admitting: Family Medicine

## 2017-02-04 DIAGNOSIS — E785 Hyperlipidemia, unspecified: Secondary | ICD-10-CM

## 2017-02-04 NOTE — Telephone Encounter (Signed)
Last Visit: 12/03/16 Next Visit: 04/01/17 Labs: 02/01/17 WNL  Okay to refill per Dr. Estanislado Pandy

## 2017-02-11 ENCOUNTER — Other Ambulatory Visit: Payer: Self-pay | Admitting: Family Medicine

## 2017-02-11 DIAGNOSIS — I10 Essential (primary) hypertension: Secondary | ICD-10-CM

## 2017-03-14 ENCOUNTER — Other Ambulatory Visit: Payer: Self-pay | Admitting: Family Medicine

## 2017-03-15 ENCOUNTER — Encounter: Payer: Self-pay | Admitting: Neurology

## 2017-03-15 DIAGNOSIS — Z79899 Other long term (current) drug therapy: Secondary | ICD-10-CM | POA: Diagnosis not present

## 2017-03-15 DIAGNOSIS — L4050 Arthropathic psoriasis, unspecified: Secondary | ICD-10-CM | POA: Diagnosis not present

## 2017-03-15 NOTE — Addendum Note (Signed)
Addended by: Inis Sizer D on: 03/15/2017 12:48 PM   Modules accepted: Orders

## 2017-03-16 DIAGNOSIS — H2513 Age-related nuclear cataract, bilateral: Secondary | ICD-10-CM | POA: Diagnosis not present

## 2017-03-16 DIAGNOSIS — H04123 Dry eye syndrome of bilateral lacrimal glands: Secondary | ICD-10-CM | POA: Diagnosis not present

## 2017-03-16 DIAGNOSIS — H3509 Other intraretinal microvascular abnormalities: Secondary | ICD-10-CM | POA: Diagnosis not present

## 2017-03-16 DIAGNOSIS — H25013 Cortical age-related cataract, bilateral: Secondary | ICD-10-CM | POA: Diagnosis not present

## 2017-03-16 LAB — CBC WITH DIFFERENTIAL/PLATELET
Basophils Absolute: 0 10*3/uL (ref 0.0–0.2)
Basos: 0 %
EOS (ABSOLUTE): 0 10*3/uL (ref 0.0–0.4)
Eos: 0 %
Hematocrit: 34.8 % (ref 34.0–46.6)
Hemoglobin: 11.8 g/dL (ref 11.1–15.9)
IMMATURE GRANS (ABS): 0 10*3/uL (ref 0.0–0.1)
IMMATURE GRANULOCYTES: 0 %
LYMPHS: 35 %
Lymphocytes Absolute: 2.8 10*3/uL (ref 0.7–3.1)
MCH: 32.8 pg (ref 26.6–33.0)
MCHC: 33.9 g/dL (ref 31.5–35.7)
MCV: 97 fL (ref 79–97)
MONOCYTES: 4 %
Monocytes Absolute: 0.3 10*3/uL (ref 0.1–0.9)
NEUTROS ABS: 4.8 10*3/uL (ref 1.4–7.0)
Neutrophils: 61 %
PLATELETS: 251 10*3/uL (ref 150–379)
RBC: 3.6 x10E6/uL — ABNORMAL LOW (ref 3.77–5.28)
RDW: 14.4 % (ref 12.3–15.4)
WBC: 8 10*3/uL (ref 3.4–10.8)

## 2017-03-16 LAB — COMPREHENSIVE METABOLIC PANEL
A/G RATIO: 1.3 (ref 1.2–2.2)
ALT: 18 IU/L (ref 0–32)
AST: 21 IU/L (ref 0–40)
Albumin: 4 g/dL (ref 3.6–4.8)
Alkaline Phosphatase: 80 IU/L (ref 39–117)
BILIRUBIN TOTAL: 0.6 mg/dL (ref 0.0–1.2)
BUN/Creatinine Ratio: 9 — ABNORMAL LOW (ref 12–28)
BUN: 6 mg/dL — AB (ref 8–27)
CO2: 22 mmol/L (ref 20–29)
Calcium: 9.2 mg/dL (ref 8.7–10.3)
Chloride: 104 mmol/L (ref 96–106)
Creatinine, Ser: 0.67 mg/dL (ref 0.57–1.00)
GFR calc Af Amer: 108 mL/min/{1.73_m2} (ref >59)
GFR calc non Af Amer: 94 mL/min/{1.73_m2} (ref >59)
GLUCOSE: 111 mg/dL — AB (ref 65–99)
Globulin, Total: 3 g/dL (ref 1.5–4.5)
POTASSIUM: 3.9 mmol/L (ref 3.5–5.2)
Sodium: 141 mmol/L (ref 134–144)
Total Protein: 7 g/dL (ref 6.0–8.5)

## 2017-03-19 NOTE — Progress Notes (Signed)
Office Visit Note  Patient: April Cox             Date of Birth: [birth year]           MRN: [id number]             PCP: Denita Lung, MD Referring: Denita Lung, MD Visit Date: 04/01/2017 Occupation: @GUAROCC @    Subjective:  Medication Management   History of Present Illness: April Cox is a 62 y.o. female history of psoriatic arthritis and psoriasis.  She states she has been doing quite well on combination of methotrexate and Remicade.  She denies any joint pain or joint swelling currently.  She states she was having problems with dry eyes and was seen by ophthalmologist.  She was given some eyedrops which has been helpful for her.  Her lower back hurts off and on.  She had a small psoriasis patch couple of months ago over her eyebrow.  She currently does not have any rash.  Activities of Daily Living:  Patient reports morning stiffness for 15 minutes.   Patient Denies nocturnal pain.  Difficulty dressing/grooming: Denies Difficulty climbing stairs: Reports Difficulty getting out of chair: Reports Difficulty using hands for taps, buttons, cutlery, and/or writing: Denies   Review of Systems  Constitutional: Positive for fatigue. Negative for night sweats, weight gain, weight loss and weakness.  HENT: Negative for mouth sores, trouble swallowing, trouble swallowing, mouth dryness and nose dryness.   Eyes: Positive for dryness. Negative for pain, redness and visual disturbance.  Respiratory: Negative for cough, shortness of breath and difficulty breathing.   Cardiovascular: Negative for chest pain, palpitations, hypertension, irregular heartbeat and swelling in legs/feet.  Gastrointestinal: Negative for blood in stool, constipation and diarrhea.  Endocrine: Negative for increased urination.  Genitourinary: Negative for vaginal dryness.  Musculoskeletal: Positive for arthralgias, joint pain and morning stiffness. Negative for joint swelling, myalgias, muscle weakness,  muscle tenderness and myalgias.  Skin: Negative for color change, rash, hair loss, skin tightness, ulcers and sensitivity to sunlight.  Allergic/Immunologic: Negative for susceptible to infections.  Neurological: Negative for dizziness, memory loss and night sweats.  Hematological: Negative for swollen glands.  Psychiatric/Behavioral: Negative for depressed mood and sleep disturbance. The patient is not nervous/anxious.     PMFS History:  Patient Active Problem List   Diagnosis Date Noted  . Sicca complex ([place]) 04/01/2017  . DDD (degenerative disc disease), lumbar 04/01/2017  . Psoriasis 04/01/2017  . Skin ulcer of finger, limited to breakdown of skin ([place]) 01/01/2017  . Dry eye 01/01/2017  . Vaccine counseling 01/01/2017  . Shingles 05/26/2016  . Primary osteoarthritis of both hands 01/07/2016  . Spondylosis of lumbar region without myelopathy or radiculopathy 01/07/2016  . High risk medication use 01/07/2016  . Essential hypertension 09/10/2014  . History of HPV infection 09/10/2014  . History of colonic polyps 09/10/2014  . Osteoporosis 09/10/2014  . Gastroesophageal reflux disease without esophagitis 09/10/2014  . Cigarette smoker 05/11/2011  . Hyperlipidemia 08/22/2010  . Hypothyroidism 08/22/2010  . Psoriatic arthritis ([place]) 08/22/2010    Past Medical History:  Diagnosis Date  . Abnormal Pap smear of cervix   . Anemia    history of anemia  . Arthritis   . Diverticulosis of colon   . Family history of adverse reaction to anesthesia    mother PONV  . GERD (gastroesophageal reflux disease)   . Heart murmur    asymptomatic per pt  . History of adenomatous polyp of colon  11-06-2009  tubullar  . HPV in female   . Hyperlipidemia   . Hypertension   . Hypothyroidism   . Mild aortic valve stenosis    per echo 10-09-2010  ef 60-65%  valva area 1.48cm  . Psoriasis   . Psoriatic arthritis ([place])    Dr. Estanislado Pandy  . Wears glasses     Family History  Problem Relation  Age of Onset  . Arthritis Mother        OA   . Psoriasis Mother   . Other Father        accidental death/MVA at age 72  . Hypertension Brother   . Other Brother        1 brother drowned  . Hypertension Maternal Grandmother   . Diabetes Maternal Grandmother   . Hypertension Maternal Grandfather   . Heart disease Neg Hx   . Cancer Neg Hx    Past Surgical History:  Procedure Laterality Date  . ABDOMINAL HYSTERECTOMY    . ANTERIOR LAT LUMBAR FUSION  12/25/2011   Procedure: ANTERIOR LATERAL LUMBAR FUSION 1 LEVEL;  Surgeon: Eustace Moore, MD;  Location: [facility];  Service: Neurosurgery;  Laterality: Left;  LUMBAR FOUR-FIVE  . BLADDER SUSPENSION N/A 12/19/2014   Procedure: TRANSVAGINAL TAPE (TVT) PROCEDURE;  Surgeon: Everett Graff, MD;  Location: [facility];  Service: Gynecology;  Laterality: N/A;  . CARPAL TUNNEL RELEASE Right April 2015  . CERVICAL CONIZATION W/BX N/A 08/28/2014   Procedure: CONIZATION CERVIX WITH BIOPSY;  Surgeon: Ena Dawley, MD;  Location[facility];  Service: Gynecology;  Laterality: N/A;  . COLONOSCOPY  11-06-2009  . CYSTO N/A 12/19/2014   Procedure: CYSTO;  Surgeon: Everett Graff, MD;  Location: [facility];  Service: Gynecology;  Laterality: N/A;  . DENTAL SURGERY    . HYSTEROSCOPY W/D&C  04-04-2008  . KNEE ARTHROPLASTY    . LUMBAR PERCUTANEOUS PEDICLE SCREW 1 LEVEL  12/25/2011   Procedure: LUMBAR PERCUTANEOUS PEDICLE SCREW 1 LEVEL;  Surgeon: Eustace Moore, MD;  Location: [facility];  Service: Neurosurgery;  Laterality: Left;  LUMBAR FOUR-FIVE  . PERONEAL NERVE DECOMPRESSION Left 12-27-2009   for neuropathy with  foot drop  . TRANSTHORACIC ECHOCARDIOGRAM  10-09-2010   mild LVH,  ef 60-65%,  grade I diastolic dysfunction,  very mild AV stenosis (area 1.48cm/S/2(VTI) with no regurg./  mild MV calcification without stenosis or regur. /  trivial TR  . VAGINAL HYSTERECTOMY Bilateral 12/19/2014   Procedure: TOTAL VAGINAL HYSTERECTOMY WITH BILATERAL  SALPINGECTOMY;  Surgeon: Ena Dawley, MD;  Location: [facility];  Service: Gynecology;  Laterality: Bilateral;   Social History   Social History Narrative  . Not on file     Objective: Vital Signs: BP 126/86 (BP Location: Left Arm, Patient Position: Sitting, Cuff Size: Normal)   Pulse 71   Resp 14   Ht 5\' 5"  (1.651 m)   Wt 131 lb (59.4 kg)   BMI 21.80 kg/m    Physical Exam  Constitutional: She is oriented to person, place, and time. She appears well-developed and well-nourished.  HENT:  Head: Normocephalic and atraumatic.  Eyes: Conjunctivae and EOM are normal.  Neck: Normal range of motion.  Cardiovascular: Normal rate, regular rhythm, normal heart sounds and intact distal pulses.  Pulmonary/Chest: Effort normal and breath sounds normal.  Abdominal: Soft. Bowel sounds are normal.  Lymphadenopathy:    She has no cervical adenopathy.  Neurological: She is alert and oriented to person, place, and time.  Skin: Skin is  warm and dry. Capillary refill takes less than 2 seconds.  Psychiatric: She has a normal mood and affect. Her behavior is normal.  Nursing note and vitals reviewed.    Musculoskeletal Exam: C-spine and thoracic  spine are in good range of motion.  She is some discomfort range of motion of lumbar spine.  Shoulders, elbows, wrist joints are good range of motion.  She has thickening of all PIP and DIP joints with synovial thickening but no active synovitis.  She has DIP subluxation in her hands.  Hip joints, knee joints are in good range of motion.  She has DIP PIP thickening in her feet.  Some warmth was noted in her knee joints.  CDAI Exam: CDAI Homunculus Exam:   Joint Counts:  CDAI Tender Joint count: 0 CDAI Swollen Joint count: 0  Global Assessments:  Patient Global Assessment: 3 Provider Global Assessment: 3  CDAI Calculated Score: 6    Investigation: No additional findings.TB Gold: 08/06/2016 Negative  CBC Latest Ref Rng & Units 03/15/2017 02/01/2017  12/07/2016  WBC 3.4 - 10.8 x10E3/uL 8.0 6.9 11.5(H)  Hemoglobin 11.1 - 15.9 g/dL 11.8 13.1 12.8  Hematocrit 34.0 - 46.6 % 34.8 39.0 37.6  Platelets 150 - 379 x10E3/uL 251 347 364   CMP Latest Ref Rng & Units 03/15/2017 02/01/2017 12/07/2016  Glucose 65 - 99 mg/dL 111(H) 96 94  BUN 8 - 27 mg/dL 6(L) 5(L) 8  Creatinine 0.57 - 1.00 mg/dL 0.67 0.65 0.60  Sodium 134 - 144 mmol/L 141 140 140  Potassium 3.5 - 5.2 mmol/L 3.9 4.0 3.9  Chloride 96 - 106 mmol/L 104 103 101  CO2 20 - 29 mmol/L 22 23 23   Calcium 8.7 - 10.3 mg/dL 9.2 9.4 9.5  Total Protein 6.0 - 8.5 g/dL 7.0 7.4 7.4  Total Bilirubin 0.0 - 1.2 mg/dL 0.6 0.6 0.5  Alkaline Phos 39 - 117 IU/L 80 75 76  AST 0 - 40 IU/L 21 16 19   ALT 0 - 32 IU/L 18 14 19     Imaging: Xr Foot 2 Views Left  Result Date: 04/01/2017 First MTP severe narrowing was noted.  PIP/DIP joint space narrowing was noted.  No erosive changes were noted.  Mild intertarsal joint space narrowing was noted. Impression: These findings are consistent with osteoarthritis.  Xr Foot 2 Views Right  Result Date: 04/01/2017 Mild first MTP in all PIP/DIP joint space narrowing was noted.  No erosive changes were noted.  Mild intertarsal joint space narrowing was noted.  Dorsal spurring was noted.  No tibiotalar joint space narrowing was noted. Impression: These findings are consistent with osteoarthritis of the foot.  Xr Hand 2 View Left  Result Date: 04/01/2017 Juxta-articular osteopenia was noted.  Second and third MCP joint noted.  All PIP joints and severe first second and third DIP joint narrowing was noted.  CMC joint narrowing and intercarpal joint space narrowing was noted.  An erosion was noted in the ulnar styloid.  There was no radiographic progression compared to her x-rays of April 2017. Impression: These findings are consistent with psoriatic arthritis and osteoarthritis overlap.  Xr Hand 2 View Right  Result Date: 04/01/2017 Severe CMC joint narrowing was noted.   All PIP and DIP joint narrowing was noted.  Subluxation of all DIP joints was noted.  Severe narrowing was noted in first PIP and all DIP joints.  Erosive changes were noted in's first DIP joint.  Intercarpal joint space narrowing was noted.  Juxta-articular osteopenia was noted.  There  was no interval change from April 2017. Impression: These findings are consistent with psoriatic arthritis and osteoarthritis overlap.   Speciality Comments: Remicade 6mg / kg every 6 weeks TB gold negative 08/06/16// infuses at [facility]    Procedures:  No procedures performed Allergies: Codeine; Other; Penicillins; and Gluten meal   Assessment / Plan:     Visit Diagnoses: Psoriatic arthritis ([place]): She continues to have some pain and stiffness in her joints but no active synovitis was noted.  Overall she is doing much better on combination of methotrexate and Remicade.  Psoriasis: She had mild flare couple of months ago but she has no active lesions currently.  High risk medication use - Methotrexate 1.0 ml, folic acid 2mg , IV Remicade 6 mg/kg every 6 weeks.  Her labs have been stable.  We will continue to monitor her labs with her infusions.  Pain in both hands - Plan: XR Hand 2 View Right, XR Hand 2 View Left  Pain in both feet - Plan: XR Foot 2 Views Right, XR Foot 2 Views Left  Primary osteoarthritis of both hands  Sicca complex ([place]): She recently was seen by an ophthalmologist and is using over-the-counter drops and Xiidra.  DDD (degenerative disc disease), lumbar: Chronic pain  Age-related osteoporosis without current pathological fracture - on Fosamax started 09/2016 by her PCP. She will be getting follow-up DXA with her PCP  History of gastroesophageal reflux (GERD)  History of hypertension  History of colonic polyps  History of hypothyroidism  Smoker: Smoking cessation was discussed at length.   Orders: Orders Placed This Encounter  Procedures  . XR Hand 2 View Right  . XR Hand 2 View  Left  . XR Foot 2 Views Right  . XR Foot 2 Views Left   No orders of the defined types were placed in this encounter.   Face-to-face time spent with patient was 30 minutes. Greater than 50% of time was spent in counseling and coordination of care.  Follow-Up Instructions: Return in about 5 months (around 09/01/2017) for Psoriatic arthritis, Psoriasis.   Bo Merino, MD  Note - This record has been created using Editor, commissioning.  Chart creation errors have been sought, but may not always  have been located. Such creation errors do not reflect on  the standard of medical care.

## 2017-04-01 ENCOUNTER — Ambulatory Visit (INDEPENDENT_AMBULATORY_CARE_PROVIDER_SITE_OTHER): Payer: BLUE CROSS/BLUE SHIELD | Admitting: Rheumatology

## 2017-04-01 ENCOUNTER — Ambulatory Visit (INDEPENDENT_AMBULATORY_CARE_PROVIDER_SITE_OTHER): Payer: Self-pay

## 2017-04-01 ENCOUNTER — Encounter: Payer: Self-pay | Admitting: Rheumatology

## 2017-04-01 ENCOUNTER — Encounter: Payer: Self-pay | Admitting: *Deleted

## 2017-04-01 VITALS — BP 126/86 | HR 71 | Resp 14 | Ht 65.0 in | Wt 131.0 lb

## 2017-04-01 DIAGNOSIS — Z860100 Personal history of colon polyps, unspecified: Secondary | ICD-10-CM

## 2017-04-01 DIAGNOSIS — Z79899 Other long term (current) drug therapy: Secondary | ICD-10-CM | POA: Diagnosis not present

## 2017-04-01 DIAGNOSIS — L409 Psoriasis, unspecified: Secondary | ICD-10-CM

## 2017-04-01 DIAGNOSIS — Z8719 Personal history of other diseases of the digestive system: Secondary | ICD-10-CM | POA: Diagnosis not present

## 2017-04-01 DIAGNOSIS — M79671 Pain in right foot: Secondary | ICD-10-CM

## 2017-04-01 DIAGNOSIS — M3500 Sicca syndrome, unspecified: Secondary | ICD-10-CM | POA: Diagnosis not present

## 2017-04-01 DIAGNOSIS — M19041 Primary osteoarthritis, right hand: Secondary | ICD-10-CM | POA: Diagnosis not present

## 2017-04-01 DIAGNOSIS — M79641 Pain in right hand: Secondary | ICD-10-CM

## 2017-04-01 DIAGNOSIS — Z8679 Personal history of other diseases of the circulatory system: Secondary | ICD-10-CM

## 2017-04-01 DIAGNOSIS — M5136 Other intervertebral disc degeneration, lumbar region: Secondary | ICD-10-CM | POA: Diagnosis not present

## 2017-04-01 DIAGNOSIS — M79672 Pain in left foot: Secondary | ICD-10-CM

## 2017-04-01 DIAGNOSIS — M79642 Pain in left hand: Secondary | ICD-10-CM | POA: Diagnosis not present

## 2017-04-01 DIAGNOSIS — Z8639 Personal history of other endocrine, nutritional and metabolic disease: Secondary | ICD-10-CM

## 2017-04-01 DIAGNOSIS — M810 Age-related osteoporosis without current pathological fracture: Secondary | ICD-10-CM

## 2017-04-01 DIAGNOSIS — M19042 Primary osteoarthritis, left hand: Secondary | ICD-10-CM

## 2017-04-01 DIAGNOSIS — L4050 Arthropathic psoriasis, unspecified: Secondary | ICD-10-CM | POA: Diagnosis not present

## 2017-04-01 DIAGNOSIS — F17200 Nicotine dependence, unspecified, uncomplicated: Secondary | ICD-10-CM

## 2017-04-01 DIAGNOSIS — M51369 Other intervertebral disc degeneration, lumbar region without mention of lumbar back pain or lower extremity pain: Secondary | ICD-10-CM

## 2017-04-06 ENCOUNTER — Other Ambulatory Visit: Payer: Self-pay | Admitting: Family Medicine

## 2017-04-06 ENCOUNTER — Other Ambulatory Visit: Payer: Self-pay | Admitting: Rheumatology

## 2017-04-06 DIAGNOSIS — I10 Essential (primary) hypertension: Secondary | ICD-10-CM

## 2017-04-06 DIAGNOSIS — E785 Hyperlipidemia, unspecified: Secondary | ICD-10-CM

## 2017-04-06 NOTE — Telephone Encounter (Signed)
Last Visit: 04/01/17 Next visit: 09/02/17 Labs: 03/15/17 stable  Okay to refill per Dr. Tempie Hoist

## 2017-04-07 ENCOUNTER — Other Ambulatory Visit: Payer: Self-pay

## 2017-04-07 MED ORDER — EPINEPHRINE 0.3 MG/0.3ML IJ SOAJ: INTRAMUSCULAR | 2 refills | Status: DC

## 2017-04-26 ENCOUNTER — Other Ambulatory Visit: Payer: Self-pay | Admitting: *Deleted

## 2017-04-26 ENCOUNTER — Encounter: Payer: Self-pay | Admitting: Neurology

## 2017-04-26 DIAGNOSIS — Z79899 Other long term (current) drug therapy: Secondary | ICD-10-CM | POA: Diagnosis not present

## 2017-04-26 DIAGNOSIS — L4050 Arthropathic psoriasis, unspecified: Secondary | ICD-10-CM | POA: Diagnosis not present

## 2017-04-26 NOTE — Addendum Note (Signed)
Addended by: Inis Sizer D on: 04/26/2017 04:28 PM   Modules accepted: Orders

## 2017-04-26 NOTE — Addendum Note (Signed)
Addended by: Inis Sizer D on: 04/26/2017 04:27 PM   Modules accepted: Orders

## 2017-04-27 LAB — CBC WITH DIFFERENTIAL/PLATELET
BASOS: 0 %
Basophils Absolute: 0 10*3/uL (ref 0.0–0.2)
EOS (ABSOLUTE): 0.1 10*3/uL (ref 0.0–0.4)
EOS: 1 %
HEMATOCRIT: 35.8 % (ref 34.0–46.6)
HEMOGLOBIN: 12.1 g/dL (ref 11.1–15.9)
IMMATURE GRANULOCYTES: 0 %
Immature Grans (Abs): 0 10*3/uL (ref 0.0–0.1)
Lymphocytes Absolute: 2.4 10*3/uL (ref 0.7–3.1)
Lymphs: 36 %
MCH: 33.3 pg — ABNORMAL HIGH (ref 26.6–33.0)
MCHC: 33.8 g/dL (ref 31.5–35.7)
MCV: 99 fL — AB (ref 79–97)
MONOCYTES: 3 %
MONOS ABS: 0.2 10*3/uL (ref 0.1–0.9)
NEUTROS PCT: 60 %
Neutrophils Absolute: 4 10*3/uL (ref 1.4–7.0)
Platelets: 254 10*3/uL (ref 150–379)
RBC: 3.63 x10E6/uL — AB (ref 3.77–5.28)
RDW: 14.1 % (ref 12.3–15.4)
WBC: 6.7 10*3/uL (ref 3.4–10.8)

## 2017-04-27 LAB — COMPREHENSIVE METABOLIC PANEL
ALBUMIN: 4.4 g/dL (ref 3.6–4.8)
ALK PHOS: 74 IU/L (ref 39–117)
ALT: 17 IU/L (ref 0–32)
AST: 23 IU/L (ref 0–40)
Albumin/Globulin Ratio: 1.8 (ref 1.2–2.2)
BUN / CREAT RATIO: 12 (ref 12–28)
BUN: 8 mg/dL (ref 8–27)
Bilirubin Total: 0.5 mg/dL (ref 0.0–1.2)
CALCIUM: 9.2 mg/dL (ref 8.7–10.3)
CO2: 22 mmol/L (ref 20–29)
CREATININE: 0.67 mg/dL (ref 0.57–1.00)
Chloride: 103 mmol/L (ref 96–106)
GFR calc Af Amer: 108 mL/min/{1.73_m2} (ref >59)
GFR, EST NON AFRICAN AMERICAN: 94 mL/min/{1.73_m2} (ref >59)
GLOBULIN, TOTAL: 2.4 g/dL (ref 1.5–4.5)
GLUCOSE: 98 mg/dL (ref 65–99)
Potassium: 4.4 mmol/L (ref 3.5–5.2)
SODIUM: 138 mmol/L (ref 134–144)
Total Protein: 6.8 g/dL (ref 6.0–8.5)

## 2017-05-11 ENCOUNTER — Other Ambulatory Visit: Payer: Self-pay | Admitting: Family Medicine

## 2017-05-11 DIAGNOSIS — I10 Essential (primary) hypertension: Secondary | ICD-10-CM

## 2017-05-20 ENCOUNTER — Ambulatory Visit (INDEPENDENT_AMBULATORY_CARE_PROVIDER_SITE_OTHER): Payer: BLUE CROSS/BLUE SHIELD | Admitting: Family Medicine

## 2017-05-20 ENCOUNTER — Encounter: Payer: Self-pay | Admitting: Family Medicine

## 2017-05-20 VITALS — BP 130/80 | HR 86 | Temp 98.0°F | Ht 65.0 in | Wt 129.2 lb

## 2017-05-20 DIAGNOSIS — M810 Age-related osteoporosis without current pathological fracture: Secondary | ICD-10-CM

## 2017-05-20 DIAGNOSIS — L4050 Arthropathic psoriasis, unspecified: Secondary | ICD-10-CM | POA: Diagnosis not present

## 2017-05-20 DIAGNOSIS — E038 Other specified hypothyroidism: Secondary | ICD-10-CM

## 2017-05-20 DIAGNOSIS — L409 Psoriasis, unspecified: Secondary | ICD-10-CM

## 2017-05-20 DIAGNOSIS — I10 Essential (primary) hypertension: Secondary | ICD-10-CM | POA: Diagnosis not present

## 2017-05-20 DIAGNOSIS — Z79899 Other long term (current) drug therapy: Secondary | ICD-10-CM | POA: Diagnosis not present

## 2017-05-20 DIAGNOSIS — Z0000 Encounter for general adult medical examination without abnormal findings: Secondary | ICD-10-CM

## 2017-05-20 DIAGNOSIS — F17210 Nicotine dependence, cigarettes, uncomplicated: Secondary | ICD-10-CM

## 2017-05-20 DIAGNOSIS — K219 Gastro-esophageal reflux disease without esophagitis: Secondary | ICD-10-CM | POA: Diagnosis not present

## 2017-05-20 DIAGNOSIS — E785 Hyperlipidemia, unspecified: Secondary | ICD-10-CM | POA: Diagnosis not present

## 2017-05-20 LAB — POCT URINALYSIS DIP (PROADVANTAGE DEVICE)
BILIRUBIN UA: NEGATIVE
GLUCOSE UA: NEGATIVE mg/dL
Ketones, POC UA: NEGATIVE mg/dL
NITRITE UA: NEGATIVE
Protein Ur, POC: NEGATIVE mg/dL
RBC UA: NEGATIVE
SPECIFIC GRAVITY, URINE: 1.025
Urobilinogen, Ur: 3.5
pH, UA: 6 (ref 5.0–8.0)

## 2017-05-20 MED ORDER — METOPROLOL SUCCINATE ER 50 MG PO TB24: ORAL_TABLET | ORAL | 3 refills | Status: DC

## 2017-05-20 MED ORDER — MUPIROCIN 2 % EX OINT: 1.0000 "application " | TOPICAL_OINTMENT | Freq: Three times a day (TID) | CUTANEOUS | 1 refills | Status: DC

## 2017-05-20 MED ORDER — LEVOTHYROXINE SODIUM 25 MCG PO TABS: 25.0000 ug | ORAL_TABLET | Freq: Every day | ORAL | 3 refills | Status: DC

## 2017-05-20 NOTE — Progress Notes (Signed)
   Subjective:    Patient ID: April Cox, female    DOB: 1955-07-15, 62 y.o.   MRN: [id number]  HPI She is here for complete examination.  She does have underlying psoriasis with psoriatic arthritis and presently is on a DMARD as well as methotrexate.  She does occasionally has some skin breakdown and would like a refill on her Bactroban.  She is followed regularly by her gynecologist and has had a mammogram, Pap smear and DEXA which did show osteoporosis and now she is taking Fosamax.  She continues on low-dose of Synthroid.  Continues on Toprol and having no difficulty with that.  She does use Protonix stating that the Fosamax did cause some difficulty with reflux.  She does continue to smoke and is not ready to start a smoking cessation program.  She has no other concerns or complaints.  Family and social history as well as health maintenance and immunizations was reviewed.   Review of Systems  All other systems reviewed and are negative.      Objective:   Physical Exam BP 130/80 (BP Location: Left Arm, Patient Position: Sitting)   Pulse 86   Temp 98 F (36.7 C)   Ht 5\' 5"  (1.651 m)   Wt 129 lb 3.2 oz (58.6 kg)   SpO2 97%   BMI 21.50 kg/m   General Appearance:    Alert, cooperative, no distress, appears stated age  Head:    Normocephalic, without obvious abnormality, atraumatic  Eyes:    PERRL, conjunctiva/corneas clear, EOM's intact, fundi    benign  Ears:    Normal TM's and external ear canals  Nose:   Nares normal, mucosa normal, no drainage or sinus   tenderness  Throat:   Lips, mucosa, and tongue normal; teeth and gums normal  Neck:   Supple, no lymphadenopathy;  thyroid:  no   enlargement/tenderness/nodules; no carotid   bruit or JVD     Lungs:     Clear to auscultation bilaterally without wheezes, rales or     ronchi; respirations unlabored      Heart:    Regular rate and rhythm, S1 and S2 normal, no murmur, rub   or gallop  Breast Exam:    Deferred to GYN    Abdomen:     Soft, non-tender, nondistended, normoactive bowel sounds,    no masses, no hepatosplenomegaly  Genitalia:    Deferred to GYN     Extremities:   No clubbing, cyanosis or edema  Pulses:   2+ and symmetric all extremities  Skin:   Skin color, texture, turgor normal, no rashes or lesions  Lymph nodes:   Cervical, supraclavicular, and axillary nodes normal  Neurologic:   CNII-XII intact, normal strength, sensation and gait; reflexes 2+ and symmetric throughout          Psych:   Normal mood, affect, hygiene and grooming.          Assessment & Plan:  Gastroesophageal reflux disease without esophagitis  Essential hypertension  Age-related osteoporosis without current pathological fracture  Cigarette smoker  Psoriatic arthritis ([place])  Other specified hypothyroidism - Plan: TSH  Hyperlipidemia, unspecified hyperlipidemia type - Plan: Lipid panel  Psoriasis  High risk medication use Bactroban was called in.

## 2017-05-20 NOTE — Addendum Note (Signed)
Addended by: Elyse Jarvis on: 05/20/2017 10:27 AM   Modules accepted: Orders

## 2017-05-21 LAB — LIPID PANEL
CHOL/HDL RATIO: 4.4 ratio (ref 0.0–4.4)
CHOLESTEROL TOTAL: 198 mg/dL (ref 100–199)
HDL: 45 mg/dL (ref >39)
LDL CALC: 120 mg/dL — AB (ref 0–99)
Triglycerides: 165 mg/dL — ABNORMAL HIGH (ref 0–149)
VLDL CHOLESTEROL CAL: 33 mg/dL (ref 5–40)

## 2017-05-21 LAB — TSH: TSH: 1.98 u[IU]/mL (ref 0.450–4.500)

## 2017-05-21 MED ORDER — PANTOPRAZOLE SODIUM 40 MG PO TBEC: 40.0000 mg | DELAYED_RELEASE_TABLET | Freq: Every day | ORAL | 3 refills | Status: DC

## 2017-05-21 MED ORDER — ATORVASTATIN CALCIUM 40 MG PO TABS: 40.0000 mg | ORAL_TABLET | Freq: Every morning | ORAL | 3 refills | Status: DC

## 2017-05-21 NOTE — Addendum Note (Signed)
Addended by: Denita Lung on: 05/21/2017 12:13 PM   Modules accepted: Orders

## 2017-06-07 ENCOUNTER — Encounter: Payer: Self-pay | Admitting: Neurology

## 2017-06-07 ENCOUNTER — Other Ambulatory Visit: Payer: Self-pay | Admitting: Neurology

## 2017-06-07 ENCOUNTER — Other Ambulatory Visit (INDEPENDENT_AMBULATORY_CARE_PROVIDER_SITE_OTHER): Payer: BLUE CROSS/BLUE SHIELD

## 2017-06-07 DIAGNOSIS — Z79899 Other long term (current) drug therapy: Secondary | ICD-10-CM

## 2017-06-07 DIAGNOSIS — L4050 Arthropathic psoriasis, unspecified: Secondary | ICD-10-CM | POA: Diagnosis not present

## 2017-06-07 DIAGNOSIS — Z0289 Encounter for other administrative examinations: Secondary | ICD-10-CM

## 2017-06-08 LAB — CBC WITH DIFFERENTIAL/PLATELET
BASOS: 0 %
Basophils Absolute: 0 10*3/uL (ref 0.0–0.2)
EOS (ABSOLUTE): 0 10*3/uL (ref 0.0–0.4)
Eos: 1 %
HEMATOCRIT: 36.5 % (ref 34.0–46.6)
Hemoglobin: 11.9 g/dL (ref 11.1–15.9)
IMMATURE GRANS (ABS): 0 10*3/uL (ref 0.0–0.1)
IMMATURE GRANULOCYTES: 0 %
LYMPHS ABS: 2.3 10*3/uL (ref 0.7–3.1)
Lymphs: 34 %
MCH: 33 pg (ref 26.6–33.0)
MCHC: 32.6 g/dL (ref 31.5–35.7)
MCV: 101 fL — AB (ref 79–97)
Monocytes Absolute: 0.3 10*3/uL (ref 0.1–0.9)
Monocytes: 4 %
NEUTROS ABS: 4 10*3/uL (ref 1.4–7.0)
Neutrophils: 61 %
PLATELETS: 242 10*3/uL (ref 150–450)
RBC: 3.61 x10E6/uL — ABNORMAL LOW (ref 3.77–5.28)
RDW: 13.7 % (ref 12.3–15.4)
WBC: 6.6 10*3/uL (ref 3.4–10.8)

## 2017-06-08 LAB — COMPREHENSIVE METABOLIC PANEL
A/G RATIO: 1.3 (ref 1.2–2.2)
ALBUMIN: 3.9 g/dL (ref 3.6–4.8)
ALT: 14 IU/L (ref 0–32)
AST: 20 IU/L (ref 0–40)
Alkaline Phosphatase: 77 IU/L (ref 39–117)
BILIRUBIN TOTAL: 0.7 mg/dL (ref 0.0–1.2)
BUN / CREAT RATIO: 9 — AB (ref 12–28)
BUN: 5 mg/dL — AB (ref 8–27)
CHLORIDE: 103 mmol/L (ref 96–106)
CO2: 20 mmol/L (ref 20–29)
Calcium: 9.2 mg/dL (ref 8.7–10.3)
Creatinine, Ser: 0.58 mg/dL (ref 0.57–1.00)
GFR calc non Af Amer: 98 mL/min/{1.73_m2} (ref >59)
GFR, EST AFRICAN AMERICAN: 113 mL/min/{1.73_m2} (ref >59)
Globulin, Total: 2.9 g/dL (ref 1.5–4.5)
Glucose: 111 mg/dL — ABNORMAL HIGH (ref 65–99)
Potassium: 4.1 mmol/L (ref 3.5–5.2)
Sodium: 139 mmol/L (ref 134–144)
TOTAL PROTEIN: 6.8 g/dL (ref 6.0–8.5)

## 2017-06-15 DIAGNOSIS — H16223 Keratoconjunctivitis sicca, not specified as Sjogren's, bilateral: Secondary | ICD-10-CM | POA: Diagnosis not present

## 2017-06-15 DIAGNOSIS — H04123 Dry eye syndrome of bilateral lacrimal glands: Secondary | ICD-10-CM | POA: Diagnosis not present

## 2017-06-18 ENCOUNTER — Encounter: Payer: Self-pay | Admitting: Family Medicine

## 2017-06-24 ENCOUNTER — Other Ambulatory Visit: Payer: Self-pay | Admitting: Rheumatology

## 2017-06-24 NOTE — Telephone Encounter (Signed)
Last Visit: 04/01/17 Next visit: 09/02/17 Labs: 06/07/17 stable  Okay to refill per Dr. Estanislado Pandy

## 2017-07-10 ENCOUNTER — Other Ambulatory Visit: Payer: Self-pay | Admitting: Family Medicine

## 2017-07-10 DIAGNOSIS — E785 Hyperlipidemia, unspecified: Secondary | ICD-10-CM

## 2017-07-16 ENCOUNTER — Other Ambulatory Visit: Payer: Self-pay | Admitting: *Deleted

## 2017-07-16 ENCOUNTER — Encounter: Payer: Self-pay | Admitting: Neurology

## 2017-07-16 ENCOUNTER — Other Ambulatory Visit (INDEPENDENT_AMBULATORY_CARE_PROVIDER_SITE_OTHER): Payer: Self-pay

## 2017-07-16 DIAGNOSIS — Z0289 Encounter for other administrative examinations: Secondary | ICD-10-CM

## 2017-07-16 DIAGNOSIS — L4050 Arthropathic psoriasis, unspecified: Secondary | ICD-10-CM

## 2017-07-16 DIAGNOSIS — Z79899 Other long term (current) drug therapy: Secondary | ICD-10-CM

## 2017-07-17 LAB — COMPREHENSIVE METABOLIC PANEL
ALBUMIN: 4.3 g/dL (ref 3.6–4.8)
ALT: 14 IU/L (ref 0–32)
AST: 21 IU/L (ref 0–40)
Albumin/Globulin Ratio: 1.5 (ref 1.2–2.2)
Alkaline Phosphatase: 76 IU/L (ref 39–117)
BUN / CREAT RATIO: 9 — AB (ref 12–28)
BUN: 6 mg/dL — ABNORMAL LOW (ref 8–27)
Bilirubin Total: 0.3 mg/dL (ref 0.0–1.2)
CO2: 22 mmol/L (ref 20–29)
CREATININE: 0.64 mg/dL (ref 0.57–1.00)
Calcium: 9.4 mg/dL (ref 8.7–10.3)
Chloride: 104 mmol/L (ref 96–106)
GFR calc Af Amer: 110 mL/min/{1.73_m2} (ref >59)
GFR calc non Af Amer: 95 mL/min/{1.73_m2} (ref >59)
GLOBULIN, TOTAL: 2.8 g/dL (ref 1.5–4.5)
GLUCOSE: 100 mg/dL — AB (ref 65–99)
POTASSIUM: 4 mmol/L (ref 3.5–5.2)
SODIUM: 141 mmol/L (ref 134–144)
Total Protein: 7.1 g/dL (ref 6.0–8.5)

## 2017-07-17 LAB — CBC WITH DIFFERENTIAL
BASOS ABS: 0 10*3/uL (ref 0.0–0.2)
Basos: 0 %
EOS (ABSOLUTE): 0.1 10*3/uL (ref 0.0–0.4)
Eos: 1 %
HEMOGLOBIN: 13.3 g/dL (ref 11.1–15.9)
Hematocrit: 41.2 % (ref 34.0–46.6)
IMMATURE GRANULOCYTES: 0 %
Immature Grans (Abs): 0 10*3/uL (ref 0.0–0.1)
LYMPHS ABS: 3.4 10*3/uL — AB (ref 0.7–3.1)
LYMPHS: 58 %
MCH: 33 pg (ref 26.6–33.0)
MCHC: 32.3 g/dL (ref 31.5–35.7)
MCV: 102 fL — ABNORMAL HIGH (ref 79–97)
MONOCYTES: 4 %
Monocytes Absolute: 0.2 10*3/uL (ref 0.1–0.9)
NEUTROS PCT: 37 %
Neutrophils Absolute: 2.1 10*3/uL (ref 1.4–7.0)
RBC: 4.03 x10E6/uL (ref 3.77–5.28)
RDW: 14.1 % (ref 12.3–15.4)
WBC: 5.8 10*3/uL (ref 3.4–10.8)

## 2017-07-19 ENCOUNTER — Telehealth: Payer: Self-pay | Admitting: *Deleted

## 2017-07-19 NOTE — Telephone Encounter (Signed)
-----   Message from Britt Bottom, MD sent at 07/17/2017  6:07 PM EDT ----- Please let the patient know that the lab work is fine.

## 2017-07-19 NOTE — Telephone Encounter (Signed)
LMOM (identified vm)with below lab results.  She does not need to return this call unless she has questions/fim

## 2017-08-03 ENCOUNTER — Ambulatory Visit (INDEPENDENT_AMBULATORY_CARE_PROVIDER_SITE_OTHER): Payer: Self-pay

## 2017-08-03 ENCOUNTER — Ambulatory Visit (INDEPENDENT_AMBULATORY_CARE_PROVIDER_SITE_OTHER): Payer: BLUE CROSS/BLUE SHIELD | Admitting: Orthopaedic Surgery

## 2017-08-03 ENCOUNTER — Encounter (INDEPENDENT_AMBULATORY_CARE_PROVIDER_SITE_OTHER): Payer: Self-pay | Admitting: Orthopaedic Surgery

## 2017-08-03 VITALS — BP 112/68 | HR 76 | Ht 65.0 in | Wt 128.0 lb

## 2017-08-03 DIAGNOSIS — M79672 Pain in left foot: Secondary | ICD-10-CM

## 2017-08-03 NOTE — Progress Notes (Signed)
Office Visit Note   Patient: April Cox           Date of Birth: 13-Sep-1955           MRN: [id number] Visit Date: 08/03/2017              Requested by: Denita Lung, MD [place], [place] [address] PCP: Denita Lung, MD   Assessment & Plan: Visit Diagnoses:  1. Pain in left foot     Plan: Dorsal compression injury to left foot appears to be soft tissue.  Continue wearing comfortable shoes and apply ice.  Should resolve on its own over the next several weeks.  Follow-Up Instructions: Return if symptoms worsen or fail to improve.   Orders:  Orders Placed This Encounter  Procedures  . XR Foot Complete Left   No orders of the defined types were placed in this encounter.     Procedures: No procedures performed   Clinical Data: No additional findings.   Subjective: Chief Complaint  Patient presents with  . New Patient (Initial Visit)    l foot pain dropped box on foot 1 wk ago  April Cox dropped a heavy box on her left foot 8 days ago while at the beach.  She developed relatively acute onset of swelling with a small abrasion.  She continues have pain localized to the dorsum of the foot.  She does see Dr. Herminio Heads or shortness aware that she has some degenerative changes of the first metatarsal phalangeal joint.  That pain has not exacerbated.  Her pain is localized to the dorsum of the foot without numbness or tingling  HPI  Review of Systems  Constitutional: Negative for fatigue and fever.  HENT: Negative for ear pain.   Eyes: Negative for pain.  Respiratory: Negative for cough and shortness of breath.   Cardiovascular: Negative for leg swelling.  Gastrointestinal: Negative for constipation and diarrhea.  Genitourinary: Negative for difficulty urinating.  Musculoskeletal: Negative for back pain and neck pain.  Skin: Negative for rash.  Allergic/Immunologic: Positive for food allergies.  Neurological: Positive for weakness. Negative for  numbness.  Hematological: Bruises/bleeds easily.  Psychiatric/Behavioral: Negative for sleep disturbance.     Objective: Vital Signs: BP 112/68 (BP Location: Left Arm, Patient Position: Sitting, Cuff Size: Normal)   Pulse 76   Ht 5\' 5"  (1.651 m)   Wt 128 lb (58.1 kg)   BMI 21.30 kg/m   Physical Exam  Constitutional: She is oriented to person, place, and time. She appears well-developed and well-nourished.  HENT:  Mouth/Throat: Oropharynx is clear and moist.  Eyes: Pupils are equal, round, and reactive to light. EOM are normal.  Pulmonary/Chest: Effort normal.  Neurological: She is alert and oriented to person, place, and time.  Skin: Skin is warm and dry.  Psychiatric: She has a normal mood and affect. Her behavior is normal.    Ortho Exam awake alert and oriented x3.  Comfortable sitting Swelling in the dorsum of the left foot with a very small abrasion at the midportion of the midfoot.  No evidence of any infection or cellulitis.  Neurovascular exam intact.  No toe swelling.  Some pain and limitation of motion across the first metatarsal phalangeal joint consistent with the arthritis.  No crepitation.  Very mild tenderness in the area of the swelling.  Dorsal tendons appear to be intact  Specialty Comments:  No specialty comments available.  Imaging: Xr Foot Complete Left  Result Date: 08/03/2017 Films  of the left foot were obtained in several projections.  Injury occurred to the midfoot dorsally.  No evidence of any acute change i.e. if fracture or dislocation.  Considerable degenerative change at the first metatarsal phalangeal joint.  Very small  os perineum which is asymptomatic    PMFS History: Patient Active Problem List   Diagnosis Date Noted  . Sicca complex ([place]) 04/01/2017  . DDD (degenerative disc disease), lumbar 04/01/2017  . Psoriasis 04/01/2017  . Skin ulcer of finger, limited to breakdown of skin ([place]) 01/01/2017  . Dry eye 01/01/2017  . Spondylosis of  lumbar region without myelopathy or radiculopathy 01/07/2016  . High risk medication use 01/07/2016  . Essential hypertension 09/10/2014  . History of HPV infection 09/10/2014  . History of colonic polyps 09/10/2014  . Osteoporosis 09/10/2014  . Gastroesophageal reflux disease without esophagitis 09/10/2014  . Cigarette smoker 05/11/2011  . Hyperlipidemia 08/22/2010  . Hypothyroidism 08/22/2010  . Psoriatic arthritis ([place]) 08/22/2010   Past Medical History:  Diagnosis Date  . Abnormal Pap smear of cervix   . Anemia    history of anemia  . Arthritis   . Diverticulosis of colon   . Family history of adverse reaction to anesthesia    mother PONV  . GERD (gastroesophageal reflux disease)   . Heart murmur    asymptomatic per pt  . History of adenomatous polyp of colon    11-06-2009  tubullar  . HPV in female   . Hyperlipidemia   . Hypertension   . Hypothyroidism   . Mild aortic valve stenosis    per echo 10-09-2010  ef 60-65%  valva area 1.48cm  . Psoriasis   . Psoriatic arthritis ([place])    Dr. Estanislado Pandy  . Wears glasses     Family History  Problem Relation Age of Onset  . Arthritis Mother        OA   . Psoriasis Mother   . Other Father        accidental death/MVA at age 39  . Hypertension Brother   . Other Brother        1 brother drowned  . Hypertension Maternal Grandmother   . Diabetes Maternal Grandmother   . Hypertension Maternal Grandfather   . Heart disease Neg Hx   . Cancer Neg Hx     Past Surgical History:  Procedure Laterality Date  . ABDOMINAL HYSTERECTOMY    . ANTERIOR LAT LUMBAR FUSION  12/25/2011   Procedure: ANTERIOR LATERAL LUMBAR FUSION 1 LEVEL;  Surgeon: Eustace Moore, MD;  Location: [facility];  Service: Neurosurgery;  Laterality: Left;  LUMBAR FOUR-FIVE  . BLADDER SUSPENSION N/A 12/19/2014   Procedure: TRANSVAGINAL TAPE (TVT) PROCEDURE;  Surgeon: Everett Graff, MD;  Location: [facility];  Service: Gynecology;  Laterality: N/A;  . CARPAL TUNNEL  RELEASE Right April 2015  . CERVICAL CONIZATION W/BX N/A 08/28/2014   Procedure: CONIZATION CERVIX WITH BIOPSY;  Surgeon: Ena Dawley, MD;  Location: [facility];  Service: Gynecology;  Laterality: N/A;  . COLONOSCOPY  11-06-2009  . CYSTO N/A 12/19/2014   Procedure: CYSTO;  Surgeon: Everett Graff, MD;  Location: [facility];  Service: Gynecology;  Laterality: N/A;  . DENTAL SURGERY    . HYSTEROSCOPY W/D&C  04-04-2008  . KNEE ARTHROPLASTY    . LUMBAR PERCUTANEOUS PEDICLE SCREW 1 LEVEL  12/25/2011   Procedure: LUMBAR PERCUTANEOUS PEDICLE SCREW 1 LEVEL;  Surgeon: Eustace Moore, MD;  Location: [facility];  Service: Neurosurgery;  Laterality:  Left;  LUMBAR FOUR-FIVE  . PERONEAL NERVE DECOMPRESSION Left 12-27-2009   for neuropathy with  foot drop  . TRANSTHORACIC ECHOCARDIOGRAM  10-09-2010   mild LVH,  ef 60-65%,  grade I diastolic dysfunction,  very mild AV stenosis (area 1.48cm/S/2(VTI) with no regurg./  mild MV calcification without stenosis or regur. /  trivial TR  . VAGINAL HYSTERECTOMY Bilateral 12/19/2014   Procedure: TOTAL VAGINAL HYSTERECTOMY WITH BILATERAL SALPINGECTOMY;  Surgeon: Ena Dawley, MD;  Location: [facility];  Service: Gynecology;  Laterality: Bilateral;   Social History   Occupational History  . Not on file  Tobacco Use  . Smoking status: Current Some Day Smoker    Packs/day: 0.50    Years: 16.00    Pack years: 8.00    Types: Cigarettes  . Smokeless tobacco: Never Used  Substance and Sexual Activity  . Alcohol use: Yes    Alcohol/week: 4.2 oz    Types: 7 Standard drinks or equivalent per week    Comment: OCCASIONAL  . Drug use: No  . Sexual activity: Yes

## 2017-08-13 DIAGNOSIS — Z124 Encounter for screening for malignant neoplasm of cervix: Secondary | ICD-10-CM | POA: Diagnosis not present

## 2017-08-13 DIAGNOSIS — Z1231 Encounter for screening mammogram for malignant neoplasm of breast: Secondary | ICD-10-CM | POA: Diagnosis not present

## 2017-08-13 DIAGNOSIS — Z6821 Body mass index (BMI) 21.0-21.9, adult: Secondary | ICD-10-CM | POA: Diagnosis not present

## 2017-08-13 DIAGNOSIS — Z01419 Encounter for gynecological examination (general) (routine) without abnormal findings: Secondary | ICD-10-CM | POA: Diagnosis not present

## 2017-08-24 DIAGNOSIS — M810 Age-related osteoporosis without current pathological fracture: Secondary | ICD-10-CM | POA: Diagnosis not present

## 2017-08-24 LAB — HM DEXA SCAN

## 2017-08-26 NOTE — Progress Notes (Deleted)
Office Visit Note  Patient: April Cox             Date of Birth: [birth year]           MRN: [id number]             PCP: Denita Lung, MD Referring: Denita Lung, MD Visit Date: 09/02/2017 Occupation: @GUAROCC @  Subjective:  No chief complaint on file.   History of Present Illness: April Cox is a 62 y.o. female ***   Activities of Daily Living:  Patient reports morning stiffness for *** {minute/hour:19697}.   Patient {ACTIONS;DENIES/REPORTS:21021675::"Denies"} nocturnal pain.  Difficulty dressing/grooming: {ACTIONS;DENIES/REPORTS:21021675::"Denies"} Difficulty climbing stairs: {ACTIONS;DENIES/REPORTS:21021675::"Denies"} Difficulty getting out of chair: {ACTIONS;DENIES/REPORTS:21021675::"Denies"} Difficulty using hands for taps, buttons, cutlery, and/or writing: {ACTIONS;DENIES/REPORTS:21021675::"Denies"}  No Rheumatology ROS completed.   PMFS History:  Patient Active Problem List   Diagnosis Date Noted  . Sicca complex ([place]) 04/01/2017  . DDD (degenerative disc disease), lumbar 04/01/2017  . Psoriasis 04/01/2017  . Skin ulcer of finger, limited to breakdown of skin ([place]) 01/01/2017  . Dry eye 01/01/2017  . Spondylosis of lumbar region without myelopathy or radiculopathy 01/07/2016  . High risk medication use 01/07/2016  . Essential hypertension 09/10/2014  . History of HPV infection 09/10/2014  . History of colonic polyps 09/10/2014  . Osteoporosis 09/10/2014  . Gastroesophageal reflux disease without esophagitis 09/10/2014  . Cigarette smoker 05/11/2011  . Hyperlipidemia 08/22/2010  . Hypothyroidism 08/22/2010  . Psoriatic arthritis ([place]) 08/22/2010    Past Medical History:  Diagnosis Date  . Abnormal Pap smear of cervix   . Anemia    history of anemia  . Arthritis   . Diverticulosis of colon   . Family history of adverse reaction to anesthesia    mother PONV  . GERD (gastroesophageal reflux disease)   . Heart murmur    asymptomatic per pt  .  History of adenomatous polyp of colon    11-06-2009  tubullar  . HPV in female   . Hyperlipidemia   . Hypertension   . Hypothyroidism   . Mild aortic valve stenosis    per echo 10-09-2010  ef 60-65%  valva area 1.48cm  . Psoriasis   . Psoriatic arthritis ([place])    Dr. Estanislado Pandy  . Wears glasses     Family History  Problem Relation Age of Onset  . Arthritis Mother        OA   . Psoriasis Mother   . Other Father        accidental death/MVA at age 39  . Hypertension Brother   . Other Brother        1 brother drowned  . Hypertension Maternal Grandmother   . Diabetes Maternal Grandmother   . Hypertension Maternal Grandfather   . Heart disease Neg Hx   . Cancer Neg Hx    Past Surgical History:  Procedure Laterality Date  . ABDOMINAL HYSTERECTOMY    . ANTERIOR LAT LUMBAR FUSION  12/25/2011   Procedure: ANTERIOR LATERAL LUMBAR FUSION 1 LEVEL;  Surgeon: Eustace Moore, MD;  Location: [facility];  Service: Neurosurgery;  Laterality: Left;  LUMBAR FOUR-FIVE  . BLADDER SUSPENSION N/A 12/19/2014   Procedure: TRANSVAGINAL TAPE (TVT) PROCEDURE;  Surgeon: Everett Graff, MD;  Location: [facility];  Service: Gynecology;  Laterality: N/A;  . CARPAL TUNNEL RELEASE Right April 2015  . CERVICAL CONIZATION W/BX N/A 08/28/2014   Procedure: CONIZATION CERVIX WITH BIOPSY;  Surgeon: Ena Dawley, MD;  Location: [facility];  Service: Gynecology;  Laterality: N/A;  . COLONOSCOPY  11-06-2009  . CYSTO N/A 12/19/2014   Procedure: CYSTO;  Surgeon: Everett Graff, MD;  Location: [facility];  Service: Gynecology;  Laterality: N/A;  . DENTAL SURGERY    . HYSTEROSCOPY W/D&C  04-04-2008  . KNEE ARTHROPLASTY    . LUMBAR PERCUTANEOUS PEDICLE SCREW 1 LEVEL  12/25/2011   Procedure: LUMBAR PERCUTANEOUS PEDICLE SCREW 1 LEVEL;  Surgeon: Eustace Moore, MD;  Location: [facility];  Service: Neurosurgery;  Laterality: Left;  LUMBAR FOUR-FIVE  . PERONEAL NERVE DECOMPRESSION Left 12-27-2009   for neuropathy  with  foot drop  . TRANSTHORACIC ECHOCARDIOGRAM  10-09-2010   mild LVH,  ef 60-65%,  grade I diastolic dysfunction,  very mild AV stenosis (area 1.48cm/S/2(VTI) with no regurg./  mild MV calcification without stenosis or regur. /  trivial TR  . VAGINAL HYSTERECTOMY Bilateral 12/19/2014   Procedure: TOTAL VAGINAL HYSTERECTOMY WITH BILATERAL SALPINGECTOMY;  Surgeon: Ena Dawley, MD;  Location: [facility];  Service: Gynecology;  Laterality: Bilateral;   Social History   Social History Narrative  . Not on file    Objective: Vital Signs: There were no vitals taken for this visit.   Physical Exam   Musculoskeletal Exam: ***  CDAI Exam: CDAI Score: Not documented Patient Global Assessment: Not documented; Provider Global Assessment: Not documented Swollen: Not documented; Tender: Not documented Joint Exam   Not documented   There is currently no information documented on the homunculus. Go to the Rheumatology activity and complete the homunculus joint exam.  Investigation: No additional findings.  Imaging: Xr Foot Complete Left  Result Date: 08/03/2017 Films of the left foot were obtained in several projections.  Injury occurred to the midfoot dorsally.  No evidence of any acute change i.e. if fracture or dislocation.  Considerable degenerative change at the first metatarsal phalangeal joint.  Very small  os perineum which is asymptomatic   Recent Labs: Lab Results  Component Value Date   WBC 5.8 07/16/2017   HGB 13.3 07/16/2017   PLT 242 06/07/2017   NA 141 07/16/2017   K 4.0 07/16/2017   CL 104 07/16/2017   CO2 22 07/16/2017   GLUCOSE 100 (H) 07/16/2017   BUN 6 (L) 07/16/2017   CREATININE 0.64 07/16/2017   BILITOT 0.3 07/16/2017   ALKPHOS 76 07/16/2017   AST 21 07/16/2017   ALT 14 07/16/2017   PROT 7.1 07/16/2017   ALBUMIN 4.3 07/16/2017   CALCIUM 9.4 07/16/2017   GFRAA 110 07/16/2017   QFTBGOLD Negative 08/02/2015    Speciality Comments: Remicade 6mg / kg  every 6 weeks TB gold negative 08/06/16// infuses at [facility]  Procedures:  No procedures performed Allergies: Codeine; Other; Penicillins; and Gluten meal   Assessment / Plan:     Visit Diagnoses: No diagnosis found.   Orders: No orders of the defined types were placed in this encounter.  No orders of the defined types were placed in this encounter.   Face-to-face time spent with patient was *** minutes. Greater than 50% of time was spent in counseling and coordination of care.  Follow-Up Instructions: No follow-ups on file.   Earnestine Mealing, CMA  Note - This record has been created using Editor, commissioning.  Chart creation errors have been sought, but may not always  have been located. Such creation errors do not reflect on  the standard of medical care.

## 2017-08-29 ENCOUNTER — Other Ambulatory Visit: Payer: Self-pay | Admitting: Family Medicine

## 2017-08-30 ENCOUNTER — Telehealth: Payer: Self-pay | Admitting: Rheumatology

## 2017-08-30 ENCOUNTER — Other Ambulatory Visit: Payer: Self-pay | Admitting: Neurology

## 2017-08-30 ENCOUNTER — Other Ambulatory Visit: Payer: Self-pay

## 2017-08-30 ENCOUNTER — Other Ambulatory Visit: Payer: Self-pay | Admitting: *Deleted

## 2017-08-30 ENCOUNTER — Encounter: Payer: Self-pay | Admitting: Neurology

## 2017-08-30 ENCOUNTER — Ambulatory Visit: Payer: BLUE CROSS/BLUE SHIELD | Admitting: Physician Assistant

## 2017-08-30 DIAGNOSIS — Z79899 Other long term (current) drug therapy: Secondary | ICD-10-CM | POA: Diagnosis not present

## 2017-08-30 DIAGNOSIS — L4050 Arthropathic psoriasis, unspecified: Secondary | ICD-10-CM

## 2017-08-30 NOTE — Telephone Encounter (Signed)
[facility] is requesting to fill pt alendronate. Please advise. [place]

## 2017-08-30 NOTE — Telephone Encounter (Signed)
Patient called stating she has pain in her left leg from her hip to foot, as well as bilateral hands.   Patient is requesting a prescription refill of Prednisone to be sent to [facility] on [address]

## 2017-08-30 NOTE — Telephone Encounter (Signed)
Patient states she is experiencing a flare in both feet, hands, eyes, knee and hip. I offered patient an appointment for this afternoon at 2:00pm and she agreed.

## 2017-08-30 NOTE — Telephone Encounter (Signed)
Patient called the front desk and states forgot she has a Remicade infusion this afternoon so she doesn't need the appointment with Dr. Estanislado Pandy. Patient states said will call back tomorrow if she still needs to see her. Patient states she is hoping her infusion will help.

## 2017-08-31 ENCOUNTER — Telehealth: Payer: Self-pay | Admitting: *Deleted

## 2017-08-31 LAB — COMPREHENSIVE METABOLIC PANEL
ALBUMIN: 4.5 g/dL (ref 3.6–4.8)
ALK PHOS: 83 IU/L (ref 39–117)
ALT: 11 IU/L (ref 0–32)
AST: 16 IU/L (ref 0–40)
Albumin/Globulin Ratio: 1.6 (ref 1.2–2.2)
BILIRUBIN TOTAL: 1 mg/dL (ref 0.0–1.2)
BUN / CREAT RATIO: 6 — AB (ref 12–28)
BUN: 4 mg/dL — AB (ref 8–27)
CHLORIDE: 103 mmol/L (ref 96–106)
CO2: 21 mmol/L (ref 20–29)
CREATININE: 0.69 mg/dL (ref 0.57–1.00)
Calcium: 9.3 mg/dL (ref 8.7–10.3)
GFR calc Af Amer: 106 mL/min/{1.73_m2} (ref >59)
GFR calc non Af Amer: 92 mL/min/{1.73_m2} (ref >59)
GLUCOSE: 96 mg/dL (ref 65–99)
Globulin, Total: 2.8 g/dL (ref 1.5–4.5)
Potassium: 3.9 mmol/L (ref 3.5–5.2)
Sodium: 139 mmol/L (ref 134–144)
Total Protein: 7.3 g/dL (ref 6.0–8.5)

## 2017-08-31 LAB — CBC WITH DIFFERENTIAL/PLATELET
BASOS ABS: 0 10*3/uL (ref 0.0–0.2)
Basos: 0 %
EOS (ABSOLUTE): 0.1 10*3/uL (ref 0.0–0.4)
EOS: 1 %
Hematocrit: 37.6 % (ref 34.0–46.6)
Hemoglobin: 12.8 g/dL (ref 11.1–15.9)
IMMATURE GRANULOCYTES: 0 %
Immature Grans (Abs): 0 10*3/uL (ref 0.0–0.1)
LYMPHS: 33 %
Lymphocytes Absolute: 2.5 10*3/uL (ref 0.7–3.1)
MCH: 33.2 pg — ABNORMAL HIGH (ref 26.6–33.0)
MCHC: 34 g/dL (ref 31.5–35.7)
MCV: 98 fL — ABNORMAL HIGH (ref 79–97)
MONOCYTES: 5 %
Monocytes Absolute: 0.4 10*3/uL (ref 0.1–0.9)
NEUTROS PCT: 61 %
Neutrophils Absolute: 4.5 10*3/uL (ref 1.4–7.0)
Platelets: 283 10*3/uL (ref 150–450)
RBC: 3.85 x10E6/uL (ref 3.77–5.28)
RDW: 14.4 % (ref 12.3–15.4)
WBC: 7.4 10*3/uL (ref 3.4–10.8)

## 2017-08-31 NOTE — Telephone Encounter (Signed)
-----   Message from Britt Bottom, MD sent at 08/31/2017  9:50 AM EDT ----- Please let her know the labwork is fine

## 2017-08-31 NOTE — Telephone Encounter (Signed)
LMOM that lab work done in our office was fine.  She does not need to return this call unless she has questions/fim

## 2017-09-01 ENCOUNTER — Telehealth: Payer: Self-pay | Admitting: Rheumatology

## 2017-09-01 NOTE — Telephone Encounter (Signed)
Called patient to discuss bone density scan. T-score: -4.1. Patient is on fosamax by PCP. Dr. Estanislado Pandy recommended a more aggressive treatment option. I advised patient of Dr. Arlean Hopping recommendations and patient states she is on fosamax due to insurance not covering anything else. Per patient, her PCP was waiting on this bone density to decide the next step.

## 2017-09-01 NOTE — Telephone Encounter (Signed)
Patient called stating she was returning your call.   °

## 2017-09-02 ENCOUNTER — Ambulatory Visit: Payer: BLUE CROSS/BLUE SHIELD | Admitting: Rheumatology

## 2017-09-03 ENCOUNTER — Telehealth: Payer: Self-pay | Admitting: Family Medicine

## 2017-09-03 NOTE — Telephone Encounter (Signed)
We need to work on getting her qualified to get [employer]

## 2017-09-03 NOTE — Telephone Encounter (Signed)
Pt called and is stating that her rheumatologist is wanting her to switch her medicine  for her osteoporosis states she sees a change in her dexa scan,states her pcp needs to change the medicine  pt uses [facility] [address], [place] [address] pt can be reached at [phone]

## 2017-09-08 NOTE — Telephone Encounter (Signed)
Did you want pt on Forteo or Prolia ?

## 2017-09-14 ENCOUNTER — Other Ambulatory Visit: Payer: Self-pay | Admitting: Family Medicine

## 2017-09-14 DIAGNOSIS — M810 Age-related osteoporosis without current pathological fracture: Secondary | ICD-10-CM

## 2017-09-14 MED ORDER — TERIPARATIDE (RECOMBINANT) 600 MCG/2.4ML ~~LOC~~ SOLN: 20.0000 ug | Freq: Every day | SUBCUTANEOUS | 11 refills | Status: DC

## 2017-09-15 ENCOUNTER — Emergency Department (HOSPITAL_COMMUNITY): Payer: BLUE CROSS/BLUE SHIELD

## 2017-09-15 ENCOUNTER — Encounter (HOSPITAL_COMMUNITY): Payer: Self-pay

## 2017-09-15 ENCOUNTER — Emergency Department (HOSPITAL_COMMUNITY)
Admission: EM | Admit: 2017-09-15 | Discharge: 2017-09-15 | Disposition: A | Discharge status: Home / Self Care | Payer: BLUE CROSS/BLUE SHIELD | Attending: Emergency Medicine | Admitting: Emergency Medicine

## 2017-09-15 ENCOUNTER — Other Ambulatory Visit: Payer: Self-pay

## 2017-09-15 ENCOUNTER — Telehealth: Payer: Self-pay | Admitting: Family Medicine

## 2017-09-15 DIAGNOSIS — E039 Hypothyroidism, unspecified: Secondary | ICD-10-CM | POA: Diagnosis not present

## 2017-09-15 DIAGNOSIS — R58 Hemorrhage, not elsewhere classified: Secondary | ICD-10-CM | POA: Diagnosis not present

## 2017-09-15 DIAGNOSIS — Z79899 Other long term (current) drug therapy: Secondary | ICD-10-CM | POA: Diagnosis not present

## 2017-09-15 DIAGNOSIS — W01198A Fall on same level from slipping, tripping and stumbling with subsequent striking against other object, initial encounter: Secondary | ICD-10-CM | POA: Insufficient documentation

## 2017-09-15 DIAGNOSIS — I10 Essential (primary) hypertension: Secondary | ICD-10-CM | POA: Insufficient documentation

## 2017-09-15 DIAGNOSIS — Y998 Other external cause status: Secondary | ICD-10-CM | POA: Insufficient documentation

## 2017-09-15 DIAGNOSIS — Z23 Encounter for immunization: Secondary | ICD-10-CM | POA: Diagnosis not present

## 2017-09-15 DIAGNOSIS — Z96659 Presence of unspecified artificial knee joint: Secondary | ICD-10-CM | POA: Insufficient documentation

## 2017-09-15 DIAGNOSIS — S0990XA Unspecified injury of head, initial encounter: Secondary | ICD-10-CM | POA: Diagnosis not present

## 2017-09-15 DIAGNOSIS — I2510 Atherosclerotic heart disease of native coronary artery without angina pectoris: Secondary | ICD-10-CM | POA: Insufficient documentation

## 2017-09-15 DIAGNOSIS — F17210 Nicotine dependence, cigarettes, uncomplicated: Secondary | ICD-10-CM | POA: Diagnosis not present

## 2017-09-15 DIAGNOSIS — S0181XA Laceration without foreign body of other part of head, initial encounter: Secondary | ICD-10-CM | POA: Diagnosis not present

## 2017-09-15 DIAGNOSIS — Y92009 Unspecified place in unspecified non-institutional (private) residence as the place of occurrence of the external cause: Secondary | ICD-10-CM | POA: Insufficient documentation

## 2017-09-15 DIAGNOSIS — F10929 Alcohol use, unspecified with intoxication, unspecified: Secondary | ICD-10-CM | POA: Diagnosis not present

## 2017-09-15 DIAGNOSIS — Y9389 Activity, other specified: Secondary | ICD-10-CM | POA: Diagnosis not present

## 2017-09-15 DIAGNOSIS — W19XXXA Unspecified fall, initial encounter: Secondary | ICD-10-CM

## 2017-09-15 MED ORDER — TETANUS-DIPHTH-ACELL PERTUSSIS 5-2.5-18.5 LF-MCG/0.5 IM SUSP
0.5000 mL | Freq: Once | INTRAMUSCULAR | Status: AC
  Administered 2017-09-15: 0.5 mL via INTRAMUSCULAR
  Filled 2017-09-15: qty 0.5

## 2017-09-15 MED ORDER — LIDOCAINE-EPINEPHRINE (PF) 2 %-1:200000 IJ SOLN: 10.0000 mL | Freq: Once | INTRAMUSCULAR | Status: DC

## 2017-09-15 MED ORDER — LIDOCAINE-EPINEPHRINE (PF) 2 %-1:200000 IJ SOLN
INTRAMUSCULAR | Status: AC
  Filled 2017-09-15: qty 20

## 2017-09-15 NOTE — ED Provider Notes (Signed)
[facility] DEPT Provider Note: Georgena Spurling, MD, FACEP  CSN: [id number] MRN: [id number] ARRIVAL: 09/15/17 at [place]: [place] [facility]   HISTORY OF PRESENT ILLNESS  09/15/17 2:28 AM April Cox is a 63 y.o. female who admits to drinking 1 glass of wine yesterday evening.  Although nursing notes state the patient hit her head but does not know when or how, the patient tells me that she tripped over her dog and hit her head against a countertop just prior to calling EMS for transport here.  She states she remembers falling. She has a laceration to her right forehead and a superficial abrasion to her left forearm.  There was copious bleeding from the forehead wound which has stopped.  She denies neck pain or other injury.  She has chronic joint pain due to psoriatic arthritis.   Past Medical History:  Diagnosis Date  . Abnormal Pap smear of cervix   . Anemia    history of anemia  . Arthritis   . Diverticulosis of colon   . Family history of adverse reaction to anesthesia    mother PONV  . GERD (gastroesophageal reflux disease)   . Heart murmur    asymptomatic per pt  . History of adenomatous polyp of colon    11-06-2009  tubullar  . HPV in female   . Hyperlipidemia   . Hypertension   . Hypothyroidism   . Mild aortic valve stenosis    per echo 10-09-2010  ef 60-65%  valva area 1.48cm  . Psoriasis   . Psoriatic arthritis ([place])    Dr. Estanislado Pandy  . Wears glasses     Past Surgical History:  Procedure Laterality Date  . ABDOMINAL HYSTERECTOMY    . ANTERIOR LAT LUMBAR FUSION  12/25/2011   Procedure: ANTERIOR LATERAL LUMBAR FUSION 1 LEVEL;  Surgeon: Eustace Moore, MD;  Location: [facility];  Service: Neurosurgery;  Laterality: Left;  LUMBAR FOUR-FIVE  . BLADDER SUSPENSION N/A 12/19/2014   Procedure: TRANSVAGINAL TAPE (TVT) PROCEDURE;  Surgeon: Everett Graff, MD;  Location: [facility];  Service: Gynecology;  Laterality: N/A;  . CARPAL TUNNEL  RELEASE Right April 2015  . CERVICAL CONIZATION W/BX N/A 08/28/2014   Procedure: CONIZATION CERVIX WITH BIOPSY;  Surgeon: Ena Dawley, MD;  Location: [facility];  Service: Gynecology;  Laterality: N/A;  . COLONOSCOPY  11-06-2009  . CYSTO N/A 12/19/2014   Procedure: CYSTO;  Surgeon: Everett Graff, MD;  Location: [facility];  Service: Gynecology;  Laterality: N/A;  . DENTAL SURGERY    . HYSTEROSCOPY W/D&C  04-04-2008  . KNEE ARTHROPLASTY    . LUMBAR PERCUTANEOUS PEDICLE SCREW 1 LEVEL  12/25/2011   Procedure: LUMBAR PERCUTANEOUS PEDICLE SCREW 1 LEVEL;  Surgeon: Eustace Moore, MD;  Location: [facility];  Service: Neurosurgery;  Laterality: Left;  LUMBAR FOUR-FIVE  . PERONEAL NERVE DECOMPRESSION Left 12-27-2009   for neuropathy with  foot drop  . TRANSTHORACIC ECHOCARDIOGRAM  10-09-2010   mild LVH,  ef 60-65%,  grade I diastolic dysfunction,  very mild AV stenosis (area 1.48cm/S/2(VTI) with no regurg./  mild MV calcification without stenosis or regur. /  trivial TR  . VAGINAL HYSTERECTOMY Bilateral 12/19/2014   Procedure: TOTAL VAGINAL HYSTERECTOMY WITH BILATERAL SALPINGECTOMY;  Surgeon: Ena Dawley, MD;  Location: [facility];  Service: Gynecology;  Laterality: Bilateral;    Family History  Problem Relation Age of Onset  . Arthritis Mother  OA   . Psoriasis Mother   . Other Father        accidental death/MVA at age 67  . Hypertension Brother   . Other Brother        1 brother drowned  . Hypertension Maternal Grandmother   . Diabetes Maternal Grandmother   . Hypertension Maternal Grandfather   . Heart disease Neg Hx   . Cancer Neg Hx     Social History   Tobacco Use  . Smoking status: Current Some Day Smoker    Packs/day: 0.50    Years: 16.00    Pack years: 8.00    Types: Cigarettes  . Smokeless tobacco: Never Used  Substance Use Topics  . Alcohol use: Yes    Alcohol/week: 7.0 standard drinks    Types: 7 Standard drinks or equivalent per week     Comment: OCCASIONAL  . Drug use: No    Prior to Admission medications   Medication Sig Start Date End Date Taking? Authorizing Provider  alendronate (FOSAMAX) 70 MG tablet TAKE ONE TABLET BY MOUTH ONCE WEEKLY WITH A FULL GLASS OF WATER ON AN EMPTY STOMACH 08/30/17   Denita Lung, MD  atorvastatin (LIPITOR) 40 MG tablet TAKE ONE TABLET BY MOUTH EVERY MORNING 07/12/17   Denita Lung, MD  calcium-vitamin D (OSCAL WITH D) 500-200 MG-UNIT per tablet Take 1 tablet by mouth 2 (two) times daily.      [provider]  Cholecalciferol (VITAMIN D PO) Take 500 tablets by mouth daily.     [provider]  clobetasol ointment (TEMOVATE) 5.63 % Apply 1 application topically as needed (psiorisis).  09/11/14   [provider]  doxycycline (VIBRA-TABS) 100 MG tablet Take 1 tablet (100 mg total) by mouth 2 (two) times daily. Patient not taking: Reported on 04/01/2017 01/07/17   Tysinger, Camelia Eng, PA-C  EPINEPHrine (EPIPEN 2-PAK) 0.3 mg/0.3 mL IJ SOAJ injection INJECT 0.3 MLS (0.3 MG TOTAL) INTO THE MUSCLE ONCE. 04/07/17   Denita Lung, MD  erythromycin [facility]) ophthalmic ointment Place 1 application into the left eye 3 (three) times daily. Patient not taking: Reported on 04/01/2017 09/09/16   Denita Lung, MD  fluorometholone (FML) 0.1 % ophthalmic suspension  06/15/17   [provider]  folic acid (FOLVITE) 1 MG tablet TAKE 2 TABLETS BY MOUTH DAILY 12/22/16   Bo Merino, MD  inFLIXimab (REMICADE) 100 MG injection Inject 6 mg/kg into the vein every 6 (six) weeks. Infuse 400mg  (4 vials) in 231mL NS over 2 hours every 6 weeks    Deveshwar, Abel Presto, MD  levothyroxine (SYNTHROID) 25 MCG tablet Take 1 tablet (25 mcg total) by mouth daily before breakfast. 05/20/17   Denita Lung, MD  methotrexate 250 MG/10ML injection INJECT 1 MILLILITER (CC) INTO THE SKIN ONCE A WEEK 06/24/17   Bo Merino, MD  metoprolol succinate (TOPROL-XL) 50 MG 24 hr tablet Take with or  immediately following a meal. 05/20/17   Denita Lung, MD  MONOJECT 1CC TB SYR 27GX1/2" 27G X 1/2" 1 ML MISC USE AS DIRECTED 04/06/17   Bo Merino, MD  mupirocin ointment (BACTROBAN) 2 % Apply 1 application topically 3 (three) times daily. 05/20/17   Denita Lung, MD  pantoprazole (PROTONIX) 40 MG tablet Take 1 tablet (40 mg total) by mouth daily. 05/21/17   Denita Lung, MD  Polyvinyl Alcohol-Povidone (REFRESH OP) Apply to eye daily.    [provider]  predniSONE (DELTASONE) 5 MG tablet Take 4 tabs  for 4 days, then 3 tabs for 4 days, then 2 tabs for 4 days then 1 tab for 4 days. Patient not taking: Reported on 01/01/2017 12/03/16   Bo Merino, MD  Teriparatide, Recombinant, 600 MCG/2.4ML SOLN Inject 0.08 mLs (20 mcg total) into the skin daily. 09/14/17   Denita Lung, MD  vitamin C (ASCORBIC ACID) 250 MG tablet Take 250 mg by mouth daily.      [provider]  XIIDRA 5 % SOLN 2 (two) times daily. 03/22/17   [provider]    Allergies Codeine; Other; Penicillins; and Gluten meal   REVIEW OF SYSTEMS  Negative except as noted here or in the History of Present Illness.   PHYSICAL EXAMINATION  Initial Vital Signs Blood pressure 131/68, pulse 78, temperature 97.6 F (36.4 C), temperature source Oral, resp. rate 19, SpO2 100 %.  Examination General: Well-developed, well-nourished female in no acute distress; appearance consistent with age of record HENT: normocephalic; right forehead laceration; no hemotympanum Eyes: pupils equal, round and reactive to light; extraocular muscles intact Neck: supple; nontender Heart: regular rate and rhythm Lungs: clear to auscultation bilaterally Abdomen: soft; nondistended; nontender; bowel sounds present Extremities: No deformity; full range of motion; pulses normal Neurologic: Awake, alert; dysarthria; motor function intact in all extremities and symmetric; no facial droop Skin: Warm and dry Psychiatric:  Normal mood and affect   RESULTS  Summary of this visit's results, reviewed by myself:   EKG Interpretation  Date/Time:    Ventricular Rate:    PR Interval:    QRS Duration:   QT Interval:    QTC Calculation:   R Axis:     Text Interpretation:        Laboratory Studies: No results found for this or any previous visit (from the past 24 hour(s)). Imaging Studies: Ct Head Wo Contrast  Result Date: 09/15/2017 CLINICAL DATA:  62 year old female with fall and head trauma. EXAM: CT HEAD WITHOUT CONTRAST TECHNIQUE: Contiguous axial images were obtained from the base of the skull through the vertex without intravenous contrast. COMPARISON:  None. FINDINGS: Brain: The ventricles and sulci appropriate size for patient's age. The gray-white matter discrimination is preserved. There is no acute intracranial hemorrhage. No mass effect or midline shift. No extra-axial fluid collection. Vascular: No hyperdense vessel or unexpected calcification. Skull: Normal. Negative for fracture or focal lesion. Sinuses/Orbits: There is mucoperiosteal thickening of the left sphenoid sinus. No air-fluid levels. The remainder of the visualized paranasal sinuses mastoid air cells are clear. Other: Right frontotemporal scalp laceration. IMPRESSION: No acute intracranial pathology. Electronically Signed   By: Anner Crete M.D.   On: 09/15/2017 03:20    ED COURSE and MDM  Nursing notes and initial vitals signs, including pulse oximetry, reviewed.  Vitals:   09/15/17 0218 09/15/17 0224 09/15/17 0231  BP:  131/68   Pulse:  78   Resp:  19   Temp:  97.6 F (36.4 C)   TempSrc:  Oral   SpO2: 99% 100%   Weight:   58.1 kg  Height:   5\' 6"  (1.676 m)   Patient advised she may developed a right periorbital hematoma given bruising surrounding the forehead wound.  PROCEDURES   LACERATION REPAIR Performed by: Wynetta Fines Authorized by: Wynetta Fines Consent: Verbal consent obtained. Risks and benefits: risks,  benefits and alternatives were discussed Consent given by: patient Patient identity confirmed: provided demographic data Prepped and Draped in normal sterile fashion Wound explored  Laceration Location: Right forehead  Laceration  Length: 1.6 cm  No Foreign Bodies seen or palpated  Anesthesia: local infiltration  Local anesthetic: lidocaine 2 % with epinephrine  Anesthetic total: 1.5 ml  Irrigation method: syringe Amount of cleaning: standard  Skin closure: 4-0 Prolene  Number of sutures: 3  Technique: Simple interrupted  Patient tolerance: Patient tolerated the procedure well with no immediate complications.     ED DIAGNOSES     ICD-10-CM   1. Fall at home, initial encounter W19.XXXA    Y92.009   2. Forehead laceration, initial encounter S01.81XA   3. Minor head injury, initial encounter S09.90XA        Deejay Koppelman, Jenny Reichmann, MD 09/15/17 [phone]

## 2017-09-15 NOTE — Telephone Encounter (Signed)
Called [facility] & set up new profile and called in new script for Forteo, they will fax me if requires P.A. Left message for pt

## 2017-09-15 NOTE — ED Notes (Signed)
Pt's hair washed, and forehead cleaned. No other injuries noted.

## 2017-09-15 NOTE — ED Triage Notes (Addendum)
Pt presents to ED via EMS after a fall. [place]. At some point pt fell outside and hit her head, but she doesn't know when or how. Pt went inside house and fell asleep before calling 911. Pt not on blood thinners. +ETOH

## 2017-09-15 NOTE — ED Notes (Signed)
Bed: QN99 Expected date:  Expected time:  Means of arrival:  Comments: EMS female fall-forehead lac-no LOC/no blood thinners

## 2017-09-15 NOTE — ED Notes (Signed)
Discharge intructions reviewed with pt. Pt verbalized understanding. Pt to return to ED for suture removal. Pt assisted to waiting room via wheelchair.

## 2017-09-15 NOTE — Telephone Encounter (Signed)
Recv'd fax from [name] that Forteo must be filled at [facility]  [facility] and must be filled at [facility] rx t# [phone]

## 2017-09-15 NOTE — Telephone Encounter (Signed)
Recv'd fax from [name] that Forteo must be filled at [facility].  [facility] and must be filled at [facility] rx t# [phone]

## 2017-09-18 ENCOUNTER — Other Ambulatory Visit: Payer: Self-pay | Admitting: Rheumatology

## 2017-09-19 ENCOUNTER — Emergency Department (HOSPITAL_COMMUNITY)
Admission: EM | Admit: 2017-09-19 | Discharge: 2017-09-19 | Disposition: A | Discharge status: Home / Self Care | Payer: BLUE CROSS/BLUE SHIELD | Attending: Emergency Medicine | Admitting: Emergency Medicine

## 2017-09-19 ENCOUNTER — Other Ambulatory Visit: Payer: Self-pay

## 2017-09-19 ENCOUNTER — Encounter (HOSPITAL_COMMUNITY): Payer: Self-pay | Admitting: Emergency Medicine

## 2017-09-19 DIAGNOSIS — X58XXXD Exposure to other specified factors, subsequent encounter: Secondary | ICD-10-CM | POA: Diagnosis not present

## 2017-09-19 DIAGNOSIS — S0181XD Laceration without foreign body of other part of head, subsequent encounter: Secondary | ICD-10-CM | POA: Diagnosis not present

## 2017-09-19 DIAGNOSIS — Z96659 Presence of unspecified artificial knee joint: Secondary | ICD-10-CM | POA: Insufficient documentation

## 2017-09-19 DIAGNOSIS — I10 Essential (primary) hypertension: Secondary | ICD-10-CM | POA: Diagnosis not present

## 2017-09-19 DIAGNOSIS — E039 Hypothyroidism, unspecified: Secondary | ICD-10-CM | POA: Insufficient documentation

## 2017-09-19 DIAGNOSIS — Z4802 Encounter for removal of sutures: Secondary | ICD-10-CM | POA: Diagnosis not present

## 2017-09-19 DIAGNOSIS — Z79899 Other long term (current) drug therapy: Secondary | ICD-10-CM | POA: Diagnosis not present

## 2017-09-19 DIAGNOSIS — F17210 Nicotine dependence, cigarettes, uncomplicated: Secondary | ICD-10-CM | POA: Insufficient documentation

## 2017-09-19 NOTE — ED Triage Notes (Signed)
Pt here for suture removal

## 2017-09-19 NOTE — ED Provider Notes (Signed)
[facility] DEPT Provider Note   CSN: [id number] Arrival date & time: 09/19/17  1050     History   Chief Complaint Chief Complaint  Patient presents with  . Suture / Staple Removal    HPI April Cox is a 62 y.o. female.  HPI Patient presents emergency room for suture removal.  She was seen in the emergency room on August 28 after sustaining a laceration to her right forehead.  She had 3 sutures placed.  Patient continues to have some soreness in the area but denies any other complaints of fevers or chills.  She is here for suture removal because her primary care doctor's office is closed on the weekend. Past Medical History:  Diagnosis Date  . Abnormal Pap smear of cervix   . Anemia    history of anemia  . Arthritis   . Diverticulosis of colon   . Family history of adverse reaction to anesthesia    mother PONV  . GERD (gastroesophageal reflux disease)   . Heart murmur    asymptomatic per pt  . History of adenomatous polyp of colon    11-06-2009  tubullar  . HPV in female   . Hyperlipidemia   . Hypertension   . Hypothyroidism   . Mild aortic valve stenosis    per echo 10-09-2010  ef 60-65%  valva area 1.48cm  . Psoriasis   . Psoriatic arthritis ([place])    Dr. Estanislado Pandy  . Wears glasses     Patient Active Problem List   Diagnosis Date Noted  . Sicca complex ([place]) 04/01/2017  . DDD (degenerative disc disease), lumbar 04/01/2017  . Psoriasis 04/01/2017  . Skin ulcer of finger, limited to breakdown of skin ([place]) 01/01/2017  . Dry eye 01/01/2017  . Spondylosis of lumbar region without myelopathy or radiculopathy 01/07/2016  . High risk medication use 01/07/2016  . Essential hypertension 09/10/2014  . History of HPV infection 09/10/2014  . History of colonic polyps 09/10/2014  . Osteoporosis 09/10/2014  . Gastroesophageal reflux disease without esophagitis 09/10/2014  . Cigarette smoker 05/11/2011  . Hyperlipidemia 08/22/2010  .  Hypothyroidism 08/22/2010  . Psoriatic arthritis ([place]) 08/22/2010    Past Surgical History:  Procedure Laterality Date  . ABDOMINAL HYSTERECTOMY    . ANTERIOR LAT LUMBAR FUSION  12/25/2011   Procedure: ANTERIOR LATERAL LUMBAR FUSION 1 LEVEL;  Surgeon: Eustace Moore, MD;  Location: [facility];  Service: Neurosurgery;  Laterality: Left;  LUMBAR FOUR-FIVE  . BLADDER SUSPENSION N/A 12/19/2014   Procedure: TRANSVAGINAL TAPE (TVT) PROCEDURE;  Surgeon: Everett Graff, MD;  Location: [facility];  Service: Gynecology;  Laterality: N/A;  . CARPAL TUNNEL RELEASE Right April 2015  . CERVICAL CONIZATION W/BX N/A 08/28/2014   Procedure: CONIZATION CERVIX WITH BIOPSY;  Surgeon: Ena Dawley, MD;  Location: [facility];  Service: Gynecology;  Laterality: N/A;  . COLONOSCOPY  11-06-2009  . CYSTO N/A 12/19/2014   Procedure: CYSTO;  Surgeon: Everett Graff, MD;  Location: [facility];  Service: Gynecology;  Laterality: N/A;  . DENTAL SURGERY    . HYSTEROSCOPY W/D&C  04-04-2008  . KNEE ARTHROPLASTY    . LUMBAR PERCUTANEOUS PEDICLE SCREW 1 LEVEL  12/25/2011   Procedure: LUMBAR PERCUTANEOUS PEDICLE SCREW 1 LEVEL;  Surgeon: Eustace Moore, MD;  Location: [facility];  Service: Neurosurgery;  Laterality: Left;  LUMBAR FOUR-FIVE  . PERONEAL NERVE DECOMPRESSION Left 12-27-2009   for neuropathy with  foot drop  . TRANSTHORACIC ECHOCARDIOGRAM  10-09-2010  mild LVH,  ef 60-65%,  grade I diastolic dysfunction,  very mild AV stenosis (area 1.48cm/S/2(VTI) with no regurg./  mild MV calcification without stenosis or regur. /  trivial TR  . VAGINAL HYSTERECTOMY Bilateral 12/19/2014   Procedure: TOTAL VAGINAL HYSTERECTOMY WITH BILATERAL SALPINGECTOMY;  Surgeon: Ena Dawley, MD;  Location: [facility];  Service: Gynecology;  Laterality: Bilateral;     OB History   None      Home Medications    Prior to Admission medications   Medication Sig Start Date End Date Taking? Authorizing Provider  alendronate  (FOSAMAX) 70 MG tablet TAKE ONE TABLET BY MOUTH ONCE WEEKLY WITH A FULL GLASS OF WATER ON AN EMPTY STOMACH 08/30/17   Denita Lung, MD  atorvastatin (LIPITOR) 40 MG tablet TAKE ONE TABLET BY MOUTH EVERY MORNING 07/12/17   Denita Lung, MD  calcium-vitamin D (OSCAL WITH D) 500-200 MG-UNIT per tablet Take 1 tablet by mouth 2 (two) times daily.      [provider]  Cholecalciferol (VITAMIN D PO) Take 500 tablets by mouth daily.     [provider]  clobetasol ointment (TEMOVATE) 1.61 % Apply 1 application topically as needed (psiorisis).  09/11/14   [provider]  EPINEPHrine (EPIPEN 2-PAK) 0.3 mg/0.3 mL IJ SOAJ injection INJECT 0.3 MLS (0.3 MG TOTAL) INTO THE MUSCLE ONCE. 04/07/17   Denita Lung, MD  fluorometholone (FML) 0.1 % ophthalmic suspension  06/15/17   [provider]  folic acid (FOLVITE) 1 MG tablet TAKE 2 TABLETS BY MOUTH DAILY 12/22/16   Bo Merino, MD  inFLIXimab (REMICADE) 100 MG injection Inject 6 mg/kg into the vein every 6 (six) weeks. Infuse 400mg  (4 vials) in 264mL NS over 2 hours every 6 weeks    Deveshwar, Abel Presto, MD  levothyroxine (SYNTHROID) 25 MCG tablet Take 1 tablet (25 mcg total) by mouth daily before breakfast. 05/20/17   Denita Lung, MD  methotrexate 250 MG/10ML injection INJECT 1 MILLILITER (CC) INTO THE SKIN ONCE A WEEK 06/24/17   Bo Merino, MD  metoprolol succinate (TOPROL-XL) 50 MG 24 hr tablet Take with or immediately following a meal. 05/20/17   Denita Lung, MD  MONOJECT 1CC TB SYR 27GX1/2" 27G X 1/2" 1 ML MISC USE AS DIRECTED 04/06/17   Bo Merino, MD  mupirocin ointment (BACTROBAN) 2 % Apply 1 application topically 3 (three) times daily. 05/20/17   Denita Lung, MD  pantoprazole (PROTONIX) 40 MG tablet Take 1 tablet (40 mg total) by mouth daily. 05/21/17   Denita Lung, MD  Polyvinyl Alcohol-Povidone (REFRESH OP) Apply to eye daily.    [provider]  Teriparatide, Recombinant, 600  MCG/2.4ML SOLN Inject 0.08 mLs (20 mcg total) into the skin daily. 09/14/17   Denita Lung, MD  vitamin C (ASCORBIC ACID) 250 MG tablet Take 250 mg by mouth daily.      [provider]  XIIDRA 5 % SOLN 2 (two) times daily. 03/22/17   [provider]    Family History Family History  Problem Relation Age of Onset  . Arthritis Mother        OA   . Psoriasis Mother   . Other Father        accidental death/MVA at age 28  . Hypertension Brother   . Other Brother        1 brother drowned  . Hypertension Maternal Grandmother   . Diabetes Maternal Grandmother   . Hypertension Maternal Grandfather   .  Heart disease Neg Hx   . Cancer Neg Hx     Social History Social History   Tobacco Use  . Smoking status: Current Some Day Smoker    Packs/day: 0.50    Years: 16.00    Pack years: 8.00    Types: Cigarettes  . Smokeless tobacco: Never Used  Substance Use Topics  . Alcohol use: Yes    Alcohol/week: 7.0 standard drinks    Types: 7 Standard drinks or equivalent per week    Comment: OCCASIONAL  . Drug use: No     Allergies   Codeine; Other; Penicillins; and Gluten meal   Review of Systems Review of Systems  Constitutional: Negative for fever.  Neurological: Positive for headaches.     Physical Exam Updated Vital Signs BP (!) 181/95   Pulse 79   Temp 98 F (36.7 C)   Resp 17   SpO2 100%   Physical Exam  Constitutional: She appears well-developed and well-nourished. No distress.  HENT:  Head: Normocephalic and atraumatic.  Right Ear: External ear normal.  Left Ear: External ear normal.  Mild bruising right temporal region, 3 sutures without signs of erythema or drainage  Eyes: Conjunctivae are normal. Right eye exhibits no discharge. Left eye exhibits no discharge. No scleral icterus.  Neck: Neck supple. No tracheal deviation present.  Cardiovascular: Normal rate.  Pulmonary/Chest: Effort normal. No stridor. No respiratory distress.  Abdominal:  She exhibits no distension.  Musculoskeletal: She exhibits no edema.  Neurological: She is alert. Cranial nerve deficit: no gross deficits.  Skin: Skin is warm and dry. No rash noted.  Psychiatric: She has a normal mood and affect.  Nursing note and vitals reviewed.    ED Treatments / Results  Labs (all labs ordered are listed, but only abnormal results are displayed) Labs Reviewed - No data to display  EKG None  Radiology No results found.  Procedures .Suture Removal Date/Time: 09/19/2017 11:31 AM Performed by: Dorie Rank, MD Authorized by: Dorie Rank, MD   Consent:    Consent obtained:  Verbal   Consent given by:  Patient   Risks discussed:  Bleeding Location:    Location:  Head/neck   Head/neck location:  Forehead Procedure details:    Wound appearance:  No signs of infection, good wound healing and clean   Number of sutures removed:  3 Post-procedure details:    Post-removal:  Band-Aid applied   Patient tolerance of procedure:  Tolerated well, no immediate complications   (including critical care time)  Medications Ordered in ED Medications - No data to display   Initial Impression / Assessment and Plan / ED Course  I have reviewed the triage vital signs and the nursing notes.  Pertinent labs & imaging results that were available during my care of the patient were reviewed by me and considered in my medical decision making (see chart for details).   Suture removal without difficulty.  No signs of infection  Final Clinical Impressions(s) / ED Diagnoses   Final diagnoses:  Visit for suture removal    ED Discharge Orders    None       Dorie Rank, MD 09/19/17 1132

## 2017-09-21 NOTE — Telephone Encounter (Signed)
Last Visit: 04/01/17 Next visit:due August 2019. Message sent to the front to schedule patient.   Okay to refill per Dr. Estanislado Pandy

## 2017-09-22 NOTE — Telephone Encounter (Signed)
P.A. [place] was required, this was completed online

## 2017-09-27 ENCOUNTER — Telehealth: Payer: Self-pay | Admitting: Rheumatology

## 2017-09-27 NOTE — Telephone Encounter (Signed)
-----   Message from Carole Binning, LPN sent at 01/27/5907  8:11 AM EDT ----- Regarding: Please schedule follow up visit. Please schedule follow up visit. Patient due August 2019. Thanks!

## 2017-09-27 NOTE — Telephone Encounter (Signed)
LMOM for patient to call and schedule follow-up appointment.   °

## 2017-09-28 NOTE — Telephone Encounter (Signed)
Patient called stating that she is doing much better since her infusion and did not want to schedule an appointment at this time.

## 2017-09-28 NOTE — Telephone Encounter (Signed)
Try Tymlos 80 mcg daily

## 2017-09-28 NOTE — Telephone Encounter (Signed)
Attempted to contact patient and left message to advise patient she is due to follow up every 5 months. Advised patient to call the office to schedule.

## 2017-09-28 NOTE — Telephone Encounter (Signed)
P.A. [place]ne [place] was denied, pt needs trial of Tylmos and also can not be on Bisphosphonate.  Do you want to switch to Tylmos or try to switch to Prolia?

## 2017-09-29 MED ORDER — ABALOPARATIDE 3120 MCG/1.56ML ~~LOC~~ SOPN: 80.0000 ug | PEN_INJECTOR | Freq: Every day | SUBCUTANEOUS | 3 refills | Status: DC

## 2017-09-29 NOTE — Telephone Encounter (Signed)
Called [facility] and cancelled [place] and called in [place] t# [phone]

## 2017-10-01 NOTE — Progress Notes (Signed)
Office Visit Note  Patient: April Cox             Date of Birth: 03-27-1955           MRN: [id number]             PCP: Denita Lung, MD Referring: Denita Lung, MD Visit Date: 10/05/2017 Occupation: @GUAROCC @  Subjective:  Pain in multiple joints   History of Present Illness: April Cox is a 62 y.o. female with history of psoriatic arthritis, osteoarthritis, DDD, and osteoporosis.  She receives Remicade infusions every 6 weeks and injects MTX 1.0 ml once a week.  She reports she had a flare 5 weeks ago.  She states it was the worst flare she has experienced in a long time.  She states her joint pain and swelling improved after having her last infusion 5 weeks ago.  She continues to have pain in multiple joints.  She reports swelling in hands and feet.  She reports that she has been having plantar fasciitis in bilateral feet as well as Achilles tenderness in the left foot.  She continues to have occasional SI joint pain bilaterally as well.  She states that she has psoriasis in the webspaces of both hands which is clearing up.  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  She continues to have sicca symptoms.  She is currently using steroid eyedrops for her severe eye dryness. Her PCP recent applied for Tymlos for management of osteoporosis, but the claim was denied.  She has been taking Fosamax 70 mg po daily.    Activities of Daily Living:  Patient reports morning stiffness for 20-30  minutes.   Patient Denies nocturnal pain.  Difficulty dressing/grooming: Denies Difficulty climbing stairs: Denies Difficulty getting out of chair: Denies Difficulty using hands for taps, buttons, cutlery, and/or writing: Reports  Review of Systems  Constitutional: Positive for fatigue.  HENT: Negative for mouth sores, mouth dryness and nose dryness.   Eyes: Positive for dryness. Negative for pain and visual disturbance.  Respiratory: Negative for cough, hemoptysis, shortness of  breath and difficulty breathing.   Cardiovascular: Negative for chest pain, palpitations, hypertension and swelling in legs/feet.  Gastrointestinal: Negative for blood in stool, constipation and diarrhea.  Endocrine: Negative for increased urination.  Genitourinary: Negative for painful urination.  Musculoskeletal: Positive for arthralgias, joint pain, joint swelling and morning stiffness. Negative for myalgias, muscle weakness, muscle tenderness and myalgias.  Skin: Positive for rash (Psoriasis). Negative for color change, pallor, hair loss, nodules/bumps, skin tightness, ulcers and sensitivity to sunlight.  Allergic/Immunologic: Negative for susceptible to infections.  Neurological: Negative for dizziness (Concussion x2 wks ago), numbness, headaches and weakness.  Hematological: Negative for swollen glands.  Psychiatric/Behavioral: Negative for depressed mood and sleep disturbance. The patient is not nervous/anxious.     PMFS History:  Patient Active Problem List   Diagnosis Date Noted  . Sicca complex ([place]) 04/01/2017  . DDD (degenerative disc disease), lumbar 04/01/2017  . Psoriasis 04/01/2017  . Skin ulcer of finger, limited to breakdown of skin ([place]) 01/01/2017  . Dry eye 01/01/2017  . Spondylosis of lumbar region without myelopathy or radiculopathy 01/07/2016  . High risk medication use 01/07/2016  . Essential hypertension 09/10/2014  . History of HPV infection 09/10/2014  . History of colonic polyps 09/10/2014  . Osteoporosis 09/10/2014  . Gastroesophageal reflux disease without esophagitis 09/10/2014  . Cigarette smoker 05/11/2011  . Hyperlipidemia 08/22/2010  . Hypothyroidism 08/22/2010  . Psoriatic arthritis ([place]) 08/22/2010  Past Medical History:  Diagnosis Date  . Abnormal Pap smear of cervix   . Anemia    history of anemia  . Arthritis   . Diverticulosis of colon   . Family history of adverse reaction to anesthesia    mother PONV  . GERD (gastroesophageal  reflux disease)   . Heart murmur    asymptomatic per pt  . History of adenomatous polyp of colon    11-06-2009  tubullar  . HPV in female   . Hyperlipidemia   . Hypertension   . Hypothyroidism   . Mild aortic valve stenosis    per echo 10-09-2010  ef 60-65%  valva area 1.48cm  . Psoriasis   . Psoriatic arthritis ([place])    Dr. Estanislado Pandy  . Wears glasses     Family History  Problem Relation Age of Onset  . Arthritis Mother        OA   . Psoriasis Mother   . Other Father        accidental death/MVA at age 88  . Hypertension Brother   . Other Brother        1 brother drowned  . Hypertension Maternal Grandmother   . Diabetes Maternal Grandmother   . Hypertension Maternal Grandfather   . Heart disease Neg Hx   . Cancer Neg Hx    Past Surgical History:  Procedure Laterality Date  . ABDOMINAL HYSTERECTOMY    . ANTERIOR LAT LUMBAR FUSION  12/25/2011   Procedure: ANTERIOR LATERAL LUMBAR FUSION 1 LEVEL;  Surgeon: Eustace Moore, MD;  Location: [facility];  Service: Neurosurgery;  Laterality: Left;  LUMBAR FOUR-FIVE  . BLADDER SUSPENSION N/A 12/19/2014   Procedure: TRANSVAGINAL TAPE (TVT) PROCEDURE;  Surgeon: Everett Graff, MD;  Location: [facility];  Service: Gynecology;  Laterality: N/A;  . CARPAL TUNNEL RELEASE Right April 2015  . CERVICAL CONIZATION W/BX N/A 08/28/2014   Procedure: CONIZATION CERVIX WITH BIOPSY;  Surgeon: Ena Dawley, MD;  Location: [facility];  Service: Gynecology;  Laterality: N/A;  . COLONOSCOPY  11-06-2009  . CYSTO N/A 12/19/2014   Procedure: CYSTO;  Surgeon: Everett Graff, MD;  Location: [facility];  Service: Gynecology;  Laterality: N/A;  . DENTAL SURGERY    . HYSTEROSCOPY W/D&C  04-04-2008  . KNEE ARTHROPLASTY    . LUMBAR PERCUTANEOUS PEDICLE SCREW 1 LEVEL  12/25/2011   Procedure: LUMBAR PERCUTANEOUS PEDICLE SCREW 1 LEVEL;  Surgeon: Eustace Moore, MD;  Location: [facility];  Service: Neurosurgery;  Laterality: Left;  LUMBAR FOUR-FIVE  .  PERONEAL NERVE DECOMPRESSION Left 12-27-2009   for neuropathy with  foot drop  . TRANSTHORACIC ECHOCARDIOGRAM  10-09-2010   mild LVH,  ef 60-65%,  grade I diastolic dysfunction,  very mild AV stenosis (area 1.48cm/S/2(VTI) with no regurg./  mild MV calcification without stenosis or regur. /  trivial TR  . VAGINAL HYSTERECTOMY Bilateral 12/19/2014   Procedure: TOTAL VAGINAL HYSTERECTOMY WITH BILATERAL SALPINGECTOMY;  Surgeon: Ena Dawley, MD;  Location: [facility];  Service: Gynecology;  Laterality: Bilateral;   Social History   Social History Narrative  . Not on file    Objective: Vital Signs: BP (!) 163/85 (BP Location: Left Arm, Patient Position: Sitting, Cuff Size: Normal)   Pulse 73   Resp 14   Ht 5\' 5"  (1.651 m)   Wt 126 lb 3.2 oz (57.2 kg)   BMI 21.00 kg/m    Physical Exam  Constitutional: She is oriented to person, place, and time. She appears  well-developed and well-nourished.  HENT:  Head: Normocephalic and atraumatic.  Eyes: Conjunctivae and EOM are normal.  Neck: Normal range of motion.  Cardiovascular: Normal rate, regular rhythm, normal heart sounds and intact distal pulses.  Pulmonary/Chest: Effort normal and breath sounds normal.  Abdominal: Soft. Bowel sounds are normal.  Lymphadenopathy:    She has no cervical adenopathy.  Neurological: She is alert and oriented to person, place, and time.  Skin: Skin is warm and dry. Capillary refill takes less than 2 seconds.  Psychiatric: She has a normal mood and affect. Her behavior is normal.  Nursing note and vitals reviewed.    Musculoskeletal Exam: C-spine, thoracic spine, lumbar spine good range of motion.  No midline spinal tenderness tenderness of bilateral SI joints.  Shoulder joints, elbow joints, wrist joints, MCPs, PIPs, DIPs good range of motion.  She has synovitis in several PIPs as described below.  She has psoriasis in the webspaces of bilateral hands.  Hip joints, knee joints, ankle joints, MTPs, PIPs, DIPs  good range of motion.  She has tenderness and swelling bilateral MTP and PIP joints.  No warmth or effusion of bilateral knee joints.  She has bilateral knee crepitus.  No tenderness of trochanteric bursa bilaterally.  CDAI Exam: CDAI Score: 11  Patient Global Assessment: 2 (mm); Provider Global Assessment: 8 (mm) Swollen: 14 ; Tender: 16  Joint Exam      Right  Left  IP  Swollen Tender  Swollen Tender  PIP 2  Swollen Tender     PIP 3  Swollen Tender     PIP 4  Swollen Tender     Sacroiliac   Tender   Tender  MTP 1  Swollen Tender  Swollen Tender  MTP 2  Swollen   Swollen   MTP 3  Swollen   Swollen   MTP 4   Tender   Tender  MTP 5   Tender   Tender  PIP 2 (toe)  Swollen   Swollen   PIP 3 (toe)  Swollen    Tender  PIP 4 (toe)   Tender   Tender     Investigation: No additional findings.  Imaging: Ct Head Wo Contrast  Result Date: 09/15/2017 CLINICAL DATA:  62 year old female with fall and head trauma. EXAM: CT HEAD WITHOUT CONTRAST TECHNIQUE: Contiguous axial images were obtained from the base of the skull through the vertex without intravenous contrast. COMPARISON:  None. FINDINGS: Brain: The ventricles and sulci appropriate size for patient's age. The gray-white matter discrimination is preserved. There is no acute intracranial hemorrhage. No mass effect or midline shift. No extra-axial fluid collection. Vascular: No hyperdense vessel or unexpected calcification. Skull: Normal. Negative for fracture or focal lesion. Sinuses/Orbits: There is mucoperiosteal thickening of the left sphenoid sinus. No air-fluid levels. The remainder of the visualized paranasal sinuses mastoid air cells are clear. Other: Right frontotemporal scalp laceration. IMPRESSION: No acute intracranial pathology. Electronically Signed   By: Anner Crete M.D.   On: 09/15/2017 03:20    Recent Labs: Lab Results  Component Value Date   WBC 7.4 08/30/2017   HGB 12.8 08/30/2017   PLT 283 08/30/2017   NA 139  08/30/2017   K 3.9 08/30/2017   CL 103 08/30/2017   CO2 21 08/30/2017   GLUCOSE 96 08/30/2017   BUN 4 (L) 08/30/2017   CREATININE 0.69 08/30/2017   BILITOT 1.0 08/30/2017   ALKPHOS 83 08/30/2017   AST 16 08/30/2017   ALT 11 08/30/2017   PROT  7.3 08/30/2017   ALBUMIN 4.5 08/30/2017   CALCIUM 9.3 08/30/2017   GFRAA 106 08/30/2017   QFTBGOLD Negative 08/02/2015    Speciality Comments: Remicade 6mg / kg every 6 weeks TB gold negative 08/06/16// infuses at [facility]  Procedures:  No procedures performed Allergies: Codeine; Other; Penicillins; and Gluten meal      Assessment / Plan:     Visit Diagnoses: Psoriatic arthritis ([place]): She has synovitis on exam as described above.  She continues to have active disease.  She has pain in multiple joints, and she has not missed any doses of her medications recently. She continues to have intermittent flares.  She is having bilateral plantar fasciitis and left achilles tendonitis.  She continues to have bilateral SI joint tenderness.  She has been receiving Remicade 6 mg/kg IV infusions every 6 weeks and MTX 1.0 mL once a week.  We discussed switching from Remicade to Cosentyx.  Indications, contraindications, and potential side effects were discussed.  Consent was obtained.  All questions were addressed.  Immunosuppressant labs are pending. She is aware she will come for her first injection in the office.    Medication counseling: TB Gold: Pending Hepatitis panel: pending  HIV: Pending  SPEP: Pending  Immunoglobulin: Pending   Does patient have a history of inflammatory bowel disease? No  Counseled patient that Cosentyx is a IL-17 inhibitor that works to reduce pain and inflammation associated with arthritis.  Counseled patient on purpose, proper use, and adverse effects of Cosentyx. Reviewed the most common adverse effects of infection, inflammatory bowel disease, and allergic reaction.  Reviewed the importance of regular labs while on Cosentyx.   Counseled patient that Cosentyx should be held prior to scheduled surgery.  Counseled patient to avoid live vaccines while on Cosentyx.  Advised patient to get annual influenza vaccine and the pneumococcal vaccine as indicated.  Provided patient with medication education material and answered all questions.  Patient consented to Cosentyx.  Will upload consent into patient's chart.  Will apply for Cosentyx through patient's insurance.  Reviewed storage information for Cosentyx.  Advised initial injection must be administered in office.  Patient voiced understanding.    Psoriasis: She has active psoriasis in the web spaces of both hands and scalp.  High risk medication use - Methotrexate 1.0 ml, folic acid 2mg , IV Remicade 6 mg/kg every 6 weeks.  She will be switching from Remicade to Cosentyx.  They have following immunosuppressant lab work will be drawn before we send in a prescription for Cosentyx.- Plan: HIV Antibody (routine testing w rflx), QuantiFERON-TB Gold Plus, Serum protein electrophoresis with reflex, IgG, IgA, IgM, Hepatitis B core antibody, IgM, Hepatitis B surface antigen, Hepatitis C antibody  Primary osteoarthritis of both hands: She has PIP and DIP synovial thickening.  She is been having increased discomfort in bilateral hands.  She has synovitis as described above.  Joint protection and muscle strengthening were discussed.  Sicca complex [facility]): She continues to have sicca symptoms.  DDD (degenerative disc disease), lumbar: Chronic pain.  She is good range of motion.  Age-related osteoporosis without current pathological fracture - She was previously taking Fosamax 70 mg po once week.  We will be appealing the claim for coverage of Tymlos.  Other medical conditions are listed as follows:   History of hypothyroidism  History of gastroesophageal reflux (GERD)  History of colonic polyps  History of hypertension  Smoker   Orders: Orders Placed This Encounter  Procedures  .  HIV Antibody (routine testing w rflx)  .  QuantiFERON-TB Gold Plus  . Serum protein electrophoresis with reflex  . IgG, IgA, IgM  . Hepatitis B core antibody, IgM  . Hepatitis B surface antigen  . Hepatitis C antibody   No orders of the defined types were placed in this encounter.   Face-to-face time spent with patient was 30 minutes. Greater than 50% of time was spent in counseling and coordination of care.  Follow-Up Instructions: Return in about 3 months (around 01/04/2018) for Psoriatic arthritis, Osteoarthritis, DDD, Osteoporosis.   Ofilia Neas, PA-C   I examined and evaluated the patient with Hazel Sams PA.  Patient continues to have synovitis.  She had synovitis in the PIP joints on my exam.  Although her symptoms have improved significantly on Remicade and methotrexate combination.  She had recent severe flare about 5 to 6 weeks ago.  We had detailed discussion regarding different treatment options.  We decided to switch her to Cosentyx.  We will apply for Cosentyx.  The plan of care was discussed as noted above.  Bo Merino, MD Note - This record has been created using Editor, commissioning.  Chart creation errors have been sought, but may not always  have been located. Such creation errors do not reflect on  the standard of medical care.

## 2017-10-05 ENCOUNTER — Telehealth: Payer: Self-pay | Admitting: Pharmacy Technician

## 2017-10-05 ENCOUNTER — Ambulatory Visit (INDEPENDENT_AMBULATORY_CARE_PROVIDER_SITE_OTHER): Payer: BLUE CROSS/BLUE SHIELD | Admitting: Rheumatology

## 2017-10-05 ENCOUNTER — Telehealth: Payer: Self-pay | Admitting: Rheumatology

## 2017-10-05 ENCOUNTER — Telehealth: Payer: Self-pay | Admitting: Family Medicine

## 2017-10-05 ENCOUNTER — Encounter: Payer: Self-pay | Admitting: Rheumatology

## 2017-10-05 VITALS — BP 163/85 | HR 73 | Resp 14 | Ht 65.0 in | Wt 126.2 lb

## 2017-10-05 DIAGNOSIS — L409 Psoriasis, unspecified: Secondary | ICD-10-CM | POA: Diagnosis not present

## 2017-10-05 DIAGNOSIS — Z79899 Other long term (current) drug therapy: Secondary | ICD-10-CM | POA: Diagnosis not present

## 2017-10-05 DIAGNOSIS — L4050 Arthropathic psoriasis, unspecified: Secondary | ICD-10-CM | POA: Diagnosis not present

## 2017-10-05 DIAGNOSIS — M5136 Other intervertebral disc degeneration, lumbar region: Secondary | ICD-10-CM

## 2017-10-05 DIAGNOSIS — Z86010 Personal history of colonic polyps: Secondary | ICD-10-CM

## 2017-10-05 DIAGNOSIS — Z8679 Personal history of other diseases of the circulatory system: Secondary | ICD-10-CM

## 2017-10-05 DIAGNOSIS — M19041 Primary osteoarthritis, right hand: Secondary | ICD-10-CM | POA: Diagnosis not present

## 2017-10-05 DIAGNOSIS — M19042 Primary osteoarthritis, left hand: Secondary | ICD-10-CM

## 2017-10-05 DIAGNOSIS — Z8639 Personal history of other endocrine, nutritional and metabolic disease: Secondary | ICD-10-CM

## 2017-10-05 DIAGNOSIS — M3500 Sicca syndrome, unspecified: Secondary | ICD-10-CM

## 2017-10-05 DIAGNOSIS — M810 Age-related osteoporosis without current pathological fracture: Secondary | ICD-10-CM

## 2017-10-05 DIAGNOSIS — Z8719 Personal history of other diseases of the digestive system: Secondary | ICD-10-CM

## 2017-10-05 DIAGNOSIS — F17200 Nicotine dependence, unspecified, uncomplicated: Secondary | ICD-10-CM

## 2017-10-05 NOTE — Telephone Encounter (Signed)
Per patient, insurance approved Tymlos thru PCP for patient. Patient request a call back to discuss since medication Dr. D wanted patient to start was declined.

## 2017-10-05 NOTE — Patient Instructions (Addendum)
Secukinumab injection What is this medicine? SECUKINUMAB (sek ue KIN ue mab) is used to treat psoriasis. It is also used to treat psoriatic arthritis and ankylosing spondylitis. This medicine may be used for other purposes; ask your health care provider or pharmacist if you have questions. COMMON BRAND NAME(S): Cosentyx What should I tell my health care provider before I take this medicine? They need to know if you have any of these conditions: -Crohn's disease, ulcerative colitis, or other inflammatory bowel disease -infection or history of infection -other conditions affecting the immune system -recently received or are scheduled to receive a vaccine -tuberculosis, a positive skin test for tuberculosis, or have recently been in close contact with someone who has tuberculosis -an unusual or allergic reaction to secukinumab, other medicines, latex, rubber, foods, dyes, or preservatives -pregnant or trying to get pregnant -breast-feeding How should I use this medicine? This medicine is for injection under the skin. It may be administered by a healthcare professional in a hospital or clinic setting or at home. If you get this medicine at home, you will be taught how to prepare and give this medicine. Use exactly as directed. Take your medicine at regular intervals. Do not take your medicine more often than directed. It is important that you put your used needles and syringes in a special sharps container. Do not put them in a trash can. If you do not have a sharps container, call your pharmacist or healthcare provider to get one. A special MedGuide will be given to you by the pharmacist with each prescription and refill. Be sure to read this information carefully each time. Talk to your pediatrician regarding the use of this medicine in children. Special care may be needed. Overdosage: If you think you have taken too much of this medicine contact a poison control center or emergency room at  once. NOTE: This medicine is only for you. Do not share this medicine with others. What if I miss a dose? It is important not to miss your dose. Call your doctor of health care professional if you are unable to keep an appointment. If you give yourself the medicine and you miss a dose, take it as soon as you can. If it is almost time for your next dose, take only that dose. Do not take double or extra doses. What may interact with this medicine? Do not take this medicine with any of the following medications: -live virus vaccines This medicine may also interact with the following medications: -cyclosporine -inactivated vaccines -warfarin This list may not describe all possible interactions. Give your health care provider a list of all the medicines, herbs, non-prescription drugs, or dietary supplements you use. Also tell them if you smoke, drink alcohol, or use illegal drugs. Some items may interact with your medicine. What should I watch for while using this medicine? Tell your doctor or healthcare professional if your symptoms do not start to get better or if they get worse. You will be tested for tuberculosis (TB) before you start this medicine. If your doctor prescribes any medicine for TB, you should start taking the TB medicine before starting this medicine. Make sure to finish the full course of TB medicine. Call your doctor or healthcare professional for advice if you get a fever, chills or sore throat, or other symptoms of a cold or flu. Do not treat yourself. This drug decreases your body's ability to fight infections. Try to avoid being around people who are sick. This medicine can decrease   the response to a vaccine. If you need to get vaccinated, tell your healthcare professional if you have received this medicine within the last 6 months. Extra booster doses may be needed. Talk to your doctor to see if a different vaccination schedule is needed. What side effects may I notice from  receiving this medicine? Side effects that you should report to your doctor or health care professional as soon as possible: -allergic reactions like skin rash, itching or hives, swelling of the face, lips, or tongue -signs and symptoms of infection like fever or chills; cough; sore throat; pain or trouble passing urine Side effects that usually do not require medical attention (report to your doctor or health care professional if they continue or are bothersome): -diarrhea This list may not describe all possible side effects. Call your doctor for medical advice about side effects. You may report side effects to FDA at 1-800-FDA-1088. Where should I keep my medicine? Keep out of the reach of children. Store the prefilled syringe or injection pen in a refrigerator between 2 to 8 degrees C (36 to 46 degrees F). Keep the syringe or the pen in the original carton until ready for use. Protect from light. Do not freeze. Do not shake. Prior to use, remove the syringe or pen from the refrigerator and use within 1 hour. Throw away any unused medicine after the expiration date on the label. NOTE: This sheet is a summary. It may not cover all possible information. If you have questions about this medicine, talk to your doctor, pharmacist, or health care provider.  2018 Elsevier/Gold Standard (2015-02-07 11:48:31)  

## 2017-10-05 NOTE — Telephone Encounter (Signed)
Received a notification from [facility] regarding a prior authorization for Tymlos. Authorization has been APPROVED from 10/05/17 to 10/04/19.   Will send document to scan center.  Authorization # [id number] Phone # [phone]  11:54 AM Beatriz Chancellor, CPhT

## 2017-10-05 NOTE — Telephone Encounter (Signed)
Patient states she was advised that her insurance approved Tymlos. Patient wanted to know if she should go ahead with this medication or if we needed to appeal for Forteo. Patient advised that Dr. Estanislado Pandy is okay with the Tymlos. Patient verbalized understanding.

## 2017-10-05 NOTE — Telephone Encounter (Signed)
I was waiting for the P.A. TYMLOS so I called [employer] t# [phone] & they said Tymlos already approved and looking at chart per note from Rheumatologist they must have gotten the P.A. Some how, but it was approved.  [facility] says looks like no co pay, they need pt to call in about 4 hours after processing and schedule delivery.  I called pt and left a message.

## 2017-10-05 NOTE — Telephone Encounter (Signed)
Received a Prior Authorization request from Dr. Estanislado Pandy for Cosentyx 300mg . Authorization has been submitted to patient's insurance via Cover My Meds. Will update once we receive a response.  Spoke to patient about Cosentyx copay card process.  11:25 AM Beatriz Chancellor, CPhT

## 2017-10-07 NOTE — Telephone Encounter (Signed)
Received a fax from [facility] regarding a prior authorization for Cosentyx 349m. Authorization has been APPROVED from 10/05/17 to 01/18/2038.   Starter kit included in approval. Please send prescription to [facility]. Patient is already established there.  Will send document to scan center.  Authorization # [id number] # [phone] 2:29 PM RBeatriz Chancellor CPhT

## 2017-10-07 NOTE — Telephone Encounter (Signed)
Received a fax from [employer] regarding a prior authorization for Cosentyx 300mg. Authorization has been APPROVED from 10/05/17 to 01/18/2038.   Starter kit included in approval. Please send prescription to [facility]. Patient is already established there.  Will send document to scan center.  Authorization # [id number] Phone # [phone]  2:29 PM Ilaisaane Marts N Shoua Ressler, CPhT  

## 2017-10-08 ENCOUNTER — Telehealth: Payer: Self-pay

## 2017-10-08 LAB — QUANTIFERON-TB GOLD PLUS
NIL: 0.03 [IU]/mL
QUANTIFERON-TB GOLD PLUS: NEGATIVE
TB1-NIL: 0 IU/mL
TB2-NIL: 0 [IU]/mL

## 2017-10-08 LAB — PROTEIN ELECTROPHORESIS, SERUM, WITH REFLEX
ALPHA 2: 0.7 g/dL (ref 0.5–0.9)
Albumin ELP: 4.6 g/dL (ref 3.8–4.8)
Alpha 1: 0.3 g/dL (ref 0.2–0.3)
BETA GLOBULIN: 0.5 g/dL (ref 0.4–0.6)
Beta 2: 0.4 g/dL (ref 0.2–0.5)
GAMMA GLOBULIN: 1.4 g/dL (ref 0.8–1.7)
TOTAL PROTEIN: 7.9 g/dL (ref 6.1–8.1)

## 2017-10-08 LAB — HEPATITIS B SURFACE ANTIGEN: Hepatitis B Surface Ag: NONREACTIVE

## 2017-10-08 LAB — HEPATITIS B CORE ANTIBODY, IGM: Hep B C IgM: NONREACTIVE

## 2017-10-08 LAB — IGG, IGA, IGM
IGM, SERUM: 266 mg/dL (ref 50–300)
IgG (Immunoglobin G), Serum: 1350 mg/dL (ref 600–1540)
Immunoglobulin A: 284 mg/dL (ref 20–320)

## 2017-10-08 LAB — HIV ANTIBODY (ROUTINE TESTING W REFLEX): HIV 1&2 Ab, 4th Generation: NONREACTIVE

## 2017-10-08 LAB — HEPATITIS C ANTIBODY
Hepatitis C Ab: NONREACTIVE
SIGNAL TO CUT-OFF: 0.02 (ref <1.00)

## 2017-10-08 NOTE — Progress Notes (Signed)
All labs are WNL

## 2017-10-08 NOTE — Telephone Encounter (Signed)
Spoke to pt about injection for osteo. Pt has advised me that [employer] has covered the tymlos. Pt was advised if she get to pharmacy and it is more than what she expects to not pick it up and to call our office to start prolia process. [place]

## 2017-10-08 NOTE — Telephone Encounter (Signed)
Cosentyx 300 mg, 0,1,2,3,4,and then every 4 weeks.Labs in 1 mth then every 3 months.

## 2017-10-11 ENCOUNTER — Other Ambulatory Visit: Payer: Self-pay | Admitting: Pharmacist

## 2017-10-11 DIAGNOSIS — Z79899 Other long term (current) drug therapy: Secondary | ICD-10-CM

## 2017-10-11 DIAGNOSIS — L4050 Arthropathic psoriasis, unspecified: Secondary | ICD-10-CM

## 2017-10-11 MED ORDER — SECUKINUMAB 150 MG/ML ~~LOC~~ SOAJ: 300.0000 mg | SUBCUTANEOUS | 0 refills | Status: DC

## 2017-10-11 NOTE — Progress Notes (Unsigned)
TB gold came back negative.  Prescription sent to [facility]. Called patient to schedule new start appointment.  She preferred to go ahead and start the medication this week with a sample instead of waiting for the prescription to be mailed as her last Remicade infusion was 6 weeks ago.  Scheduled appointment for 9/26 at [facility] for new start with Cosentyx sample.

## 2017-10-14 ENCOUNTER — Ambulatory Visit (INDEPENDENT_AMBULATORY_CARE_PROVIDER_SITE_OTHER): Payer: BLUE CROSS/BLUE SHIELD | Admitting: Pharmacist

## 2017-10-14 ENCOUNTER — Encounter: Payer: Self-pay | Admitting: *Deleted

## 2017-10-14 VITALS — BP 158/90

## 2017-10-14 DIAGNOSIS — L4050 Arthropathic psoriasis, unspecified: Secondary | ICD-10-CM

## 2017-10-14 DIAGNOSIS — M810 Age-related osteoporosis without current pathological fracture: Secondary | ICD-10-CM | POA: Diagnosis not present

## 2017-10-14 MED ORDER — SECUKINUMAB 150 MG/ML ~~LOC~~ SOAJ
300.0000 mg | Freq: Once | SUBCUTANEOUS | Status: AC
  Administered 2017-10-14: 300 mg via SUBCUTANEOUS

## 2017-10-14 MED ORDER — ABALOPARATIDE 3120 MCG/1.56ML ~~LOC~~ SOPN
80.0000 ug | PEN_INJECTOR | Freq: Once | SUBCUTANEOUS | Status: AC
  Administered 2017-10-14: 80 ug via SUBCUTANEOUS

## 2017-10-14 MED ORDER — SECUKINUMAB 150 MG/ML ~~LOC~~ SOSY: 300.0000 mg | PREFILLED_SYRINGE | Freq: Once | SUBCUTANEOUS | Status: DC

## 2017-10-14 NOTE — Patient Instructions (Signed)
Cosentyx dosing:  Two pens (300mg  total) once a week for 4 weeks then one pen (160mg ) monthly Cosentyx should be refrigerated.  Do not freeze.  Tymlos should be stored in the refrigerator until first dose, then at room temperature.  Pen expires 30 days after being at room temperature.  Standing Labs We placed an order today for your standing lab work.    Please come back and get your standing labs in 1 month then every 3 months.  We have open lab Monday through Friday from 8:30-11:30 AM and 1:30-4:00 PM  at the office of Dr. Bo Merino.   You may experience shorter wait times on Monday and Friday afternoons. The office is located at [address], [place], [place], [place] [address] No appointment is necessary.   Labs are drawn by [facility].  You may receive a bill from [facility] for your lab work. If you have any questions regarding directions or hours of operation,  please call [phone].    Vaccines You are taking a medication(s) that can suppress your immune system.  The following immunizations are recommended: . Flu annually . Pneumonia . Shingrix . Hepatitis B  Please check with your PCP to make sure you are up to date.

## 2017-10-14 NOTE — Progress Notes (Signed)
Pharmacy Note  Subjective:  Patient presents today to the [facility] for injection technique training and first dose administration of Cosentyx. Last Remicaide infusion 6 weeks ago.   Objective:  CBC    Component Value Date/Time   WBC 7.4 08/30/2017 1230   WBC 6.4 08/06/2016 0848   RBC 3.85 08/30/2017 1230   RBC 4.05 08/06/2016 0848   HGB 12.8 08/30/2017 1230   HCT 37.6 08/30/2017 1230   PLT 283 08/30/2017 1230   MCV 98 (H) 08/30/2017 1230   MCH 33.2 (H) 08/30/2017 1230   MCH 33.1 (H) 08/06/2016 0848   MCHC 34.0 08/30/2017 1230   MCHC 33.6 08/06/2016 0848   RDW 14.4 08/30/2017 1230   LYMPHSABS 2.5 08/30/2017 1230   MONOABS 320 08/06/2016 0848   EOSABS 0.1 08/30/2017 1230   BASOSABS 0.0 08/30/2017 1230    CMP     Component Value Date/Time   NA 139 08/30/2017 1230   K 3.9 08/30/2017 1230   CL 103 08/30/2017 1230   CO2 21 08/30/2017 1230   GLUCOSE 96 08/30/2017 1230   GLUCOSE 88 08/06/2016 0848   BUN 4 (L) 08/30/2017 1230   CREATININE 0.69 08/30/2017 1230   CREATININE 0.63 08/06/2016 0848   CALCIUM 9.3 08/30/2017 1230   PROT 7.9 10/05/2017 1122   PROT 7.3 08/30/2017 1230   ALBUMIN 4.5 08/30/2017 1230   AST 16 08/30/2017 1230   ALT 11 08/30/2017 1230   ALKPHOS 83 08/30/2017 1230   BILITOT 1.0 08/30/2017 1230   GFRNONAA 92 08/30/2017 1230   GFRNONAA >89 08/06/2016 0848   GFRAA 106 08/30/2017 1230   GFRAA >89 08/06/2016 0848    TB Gold:  Negative 10/05/2017 Hepatitis panel:  Negative 10/05/2017 HIV:  Negative 10/05/2017 SPEP:  Negative 10/05/2017 Immunoglobulin:  Negative 10/05/2017  Does patient have a history of inflammatory bowel disease? No  Assessment/Plan:  Counseled patient that Cosentyx is a IL-17 inhibitor that works to reduce pain and inflammation associated with arthritis.  Counseled patient on purpose, proper use, and adverse effects of Cosentyx. Reviewed the most common adverse effects of infection, inflammatory bowel disease, and  allergic reaction.  Reviewed the importance of regular labs while on Cosentyx.  Counseled patient that Cosentyx should be held prior to scheduled surgery.  Counseled patient to avoid live vaccines while on Cosentyx.  Advised patient to get annual influenza vaccine and the pneumococcal vaccine as indicated.  Provided patient with medication education material and answered all questions.  Reviewed storage information for Cosentyx.  Patient states that she does travel often and has a travel back with ice pack for her medications.  Advised initial injection must be administered in office.  Patient voiced understanding.    Patient was also recently started on Tymlos.  She brought her current prescription with her today in office and asked for some injection technique training for the Tymlos as well.  Reviewed the importance of rotating injection sites since patient is injecting the medication daily weekly and monthly.  Demonstrated proper injection technique with Cosentyx and Tymlos demo pen.  Patient able to demonstrate proper injection technique using the teach back method. Patient self injected in the abdomen with:  Sample Medication: Cosentyx NDC: 6283-1517-61 Lot: [id number] Expiration: 02/2019  Patient Supplied Medication: Richrd Sox TGG:26948-546-27 Lot: [id number] Expiration:04/2020  Observed for 30 mins in office for adverse reaction and tolerated well without allergic reaction.. . Instructed patient to call with any questions/issues.    Prior to injection patient's blood pressure was elevated.  She  does have hypertension and took her blood pressure medications this morning.  Patient states that she has whitecoat syndrome.  Elevated blood pressure could also be due to 2 large cuff size.  Encourage patient to monitor and follow-up with PCP if it continues to be an issue.  All questions encouraged and answered.  Mariella Saa, PharmD, [facility] Rheumatology Clinical Pharmacist  10/14/2017 3:33  PM

## 2017-10-15 ENCOUNTER — Telehealth: Payer: Self-pay | Admitting: Rheumatology

## 2017-10-15 NOTE — Telephone Encounter (Signed)
Called pharmacy stating patient received counseling and first dose in office with sample.  Will send to patient's home instead of the office.

## 2017-10-15 NOTE — Telephone Encounter (Signed)
April Cox from [facility] left a voicemail stating they originally scheduled patient's medication to go to her home, but it actually needs to be sent to the doctor's office.  Please call back at  [phone]

## 2017-10-28 NOTE — Telephone Encounter (Signed)
Left message for pt, want to see if she got the Tymlos from mail order

## 2017-11-05 ENCOUNTER — Other Ambulatory Visit: Payer: Self-pay | Admitting: Rheumatology

## 2017-11-05 DIAGNOSIS — H04123 Dry eye syndrome of bilateral lacrimal glands: Secondary | ICD-10-CM | POA: Diagnosis not present

## 2017-11-05 DIAGNOSIS — H16223 Keratoconjunctivitis sicca, not specified as Sjogren's, bilateral: Secondary | ICD-10-CM | POA: Diagnosis not present

## 2017-11-05 NOTE — Telephone Encounter (Signed)
Last Visit: 10/05/17 Next Visit: 01/05/18 Labs: 08/30/17 stable  Okay to refill per Dr. Estanislado Pandy

## 2017-11-22 ENCOUNTER — Other Ambulatory Visit: Payer: Self-pay | Admitting: Rheumatology

## 2017-11-22 ENCOUNTER — Encounter (INDEPENDENT_AMBULATORY_CARE_PROVIDER_SITE_OTHER): Payer: Self-pay | Admitting: *Deleted

## 2017-11-22 ENCOUNTER — Other Ambulatory Visit: Payer: Self-pay

## 2017-11-22 DIAGNOSIS — Z79899 Other long term (current) drug therapy: Secondary | ICD-10-CM

## 2017-11-22 DIAGNOSIS — L4050 Arthropathic psoriasis, unspecified: Secondary | ICD-10-CM

## 2017-11-22 NOTE — Telephone Encounter (Signed)
Last Visit: 10/05/17 Next visit: 01/05/18 Labs: 08/30/17 stable TB Gold: 10/05/17 Neg   Okay to refill per Dr. Estanislado Pandy

## 2017-11-23 ENCOUNTER — Telehealth: Payer: Self-pay | Admitting: Rheumatology

## 2017-11-23 LAB — COMPLETE METABOLIC PANEL WITH GFR
AG RATIO: 1.4 (calc) (ref 1.0–2.5)
ALT: 16 U/L (ref 6–29)
AST: 19 U/L (ref 10–35)
Albumin: 4.1 g/dL (ref 3.6–5.1)
Alkaline phosphatase (APISO): 68 U/L (ref 33–130)
BUN/Creatinine Ratio: 10 (calc) (ref 6–22)
BUN: 6 mg/dL — ABNORMAL LOW (ref 7–25)
CO2: 29 mmol/L (ref 20–32)
CREATININE: 0.63 mg/dL (ref 0.50–0.99)
Calcium: 9.7 mg/dL (ref 8.6–10.4)
Chloride: 105 mmol/L (ref 98–110)
GFR, EST AFRICAN AMERICAN: 110 mL/min/{1.73_m2} (ref >=60)
GFR, EST NON AFRICAN AMERICAN: 95 mL/min/{1.73_m2} (ref >=60)
GLOBULIN: 3 g/dL (ref 1.9–3.7)
Glucose, Bld: 89 mg/dL (ref 65–99)
POTASSIUM: 4 mmol/L (ref 3.5–5.3)
SODIUM: 140 mmol/L (ref 135–146)
TOTAL PROTEIN: 7.1 g/dL (ref 6.1–8.1)
Total Bilirubin: 0.4 mg/dL (ref 0.2–1.2)

## 2017-11-23 LAB — CBC WITH DIFFERENTIAL/PLATELET
BASOS ABS: 0 {cells}/uL (ref 0–200)
Basophils Relative: 0 %
EOS ABS: 50 {cells}/uL (ref 15–500)
Eosinophils Relative: 0.8 %
HEMATOCRIT: 34.8 % — AB (ref 35.0–45.0)
HEMOGLOBIN: 12.1 g/dL (ref 11.7–15.5)
LYMPHS ABS: 2040 {cells}/uL (ref 850–3900)
MCH: 33.9 pg — AB (ref 27.0–33.0)
MCHC: 34.8 g/dL (ref 32.0–36.0)
MCV: 97.5 fL (ref 80.0–100.0)
MPV: 10.7 fL (ref 7.5–12.5)
Monocytes Relative: 5.1 %
NEUTROS ABS: 3794 {cells}/uL (ref 1500–7800)
NEUTROS PCT: 61.2 %
Platelets: 293 10*3/uL (ref 140–400)
RBC: 3.57 10*6/uL — ABNORMAL LOW (ref 3.80–5.10)
RDW: 12.7 % (ref 11.0–15.0)
Total Lymphocyte: 32.9 %
WBC mixed population: 316 cells/uL (ref 200–950)
WBC: 6.2 10*3/uL (ref 3.8–10.8)

## 2017-11-23 NOTE — Telephone Encounter (Signed)
Patient advised that her prescription was sent to the pharmacy yesterday before 10:30 am. Patient states it is on hold at the pharmacy because they are having to get it approved by [facility]. Patient is going out of town to [place] for a business trip and will not have her medication if the pharmacy is unable to over it tonight. Patient advised to contact me in the morning if she will need to come to the office to pick up a sample.

## 2017-11-23 NOTE — Telephone Encounter (Signed)
Patient left a voicemail stating she had her labwork yesterday and is checking to see if her prescription was able to be sent to [facility].  Patient requested a return call.

## 2017-11-24 ENCOUNTER — Encounter: Payer: Self-pay | Admitting: *Deleted

## 2017-12-01 ENCOUNTER — Telehealth: Payer: Self-pay | Admitting: Rheumatology

## 2017-12-01 MED ORDER — PREDNISONE 5 MG PO TABS: ORAL_TABLET | ORAL | 0 refills | Status: DC

## 2017-12-01 NOTE — Telephone Encounter (Signed)
Patient advised prescription was sent to the pharmacy for prednisone. Verified Cosentyx dosing in 300 mg monthly.

## 2017-12-01 NOTE — Telephone Encounter (Signed)
Patient having a flare, swelling with bil hands. Patient request an rx for Prednisone to be sent to [name] [address]. Patient also has a question about her Cosentyx, if you would please call to discuss.

## 2017-12-01 NOTE — Telephone Encounter (Signed)
Patient had questions about the Cosentyx dosing. Patient has had her loading dose. Patient states she has received the prescription from her pharmacy and wanted to make sure the prescription direction were correct. Patient states the directions advise her to take it monthly. Patient advised only the loading dose is weekly and the maintenance dose is monthly.  Patient states she is having a flare. Patient states her hands are painful and swollen. Patient she was recently in [place] and just came back home. Patient states she feels the change in the weather is contributing. Patient states the flare started on 11/28/17 and the swelling and pain has gotten worse. Patient states she is having pain in wrists and left shoulder. Patient states she has not had a flare in quite a while. Patient just started on Cosentyx. Patient would like to know if she can have a prescription for prednisone. Please advise.

## 2017-12-01 NOTE — Telephone Encounter (Signed)
Okay to give prednisone taper starting at 20 mg and taper by 5 mg every 4 days.  She should be on Cosentyx 300 mg once a month which is the maintenance dose.

## 2017-12-01 NOTE — Addendum Note (Signed)
Addended by: Carole Binning on: 12/01/2017 02:52 PM   Modules accepted: Orders

## 2017-12-27 NOTE — Progress Notes (Deleted)
Office Visit Note  Patient: April Cox             Date of Birth: 08/27/1955           MRN: [id number]             PCP: Denita Lung, MD Referring: Denita Lung, MD Visit Date: 01/05/2018 Occupation: @GUAROCC @  Subjective:  No chief complaint on file.  Cosentyx 300 mg monthly, methotrexate 1 mL weekly, and folic acid 2 mg daily.  Last TB gold negative on 10/05/2017.  Most recent CBC/CMP within normal limits on 11/22/2017.  Next CBC/CMP due in February and then every 3 months.  Standing orders are in place.  Patient has received Prevnar 13 and Pneumovax 23.  Recommend annual flu and Shingrix vaccine as indicated.  Currently on Tymlos managed by Dr.Lalonde .  Previously treated with Fosamax from 09/2016 to 09/2017.   History of Present Illness: April Cox is a 62 y.o. female with history of psoriatic arthritis, osteoarthritis, DDD, and osteoporosis.     Activities of Daily Living:  Patient reports morning stiffness for *** {minute/hour:19697}.   Patient {ACTIONS;DENIES/REPORTS:21021675::"Denies"} nocturnal pain.  Difficulty dressing/grooming: {ACTIONS;DENIES/REPORTS:21021675::"Denies"} Difficulty climbing stairs: {ACTIONS;DENIES/REPORTS:21021675::"Denies"} Difficulty getting out of chair: {ACTIONS;DENIES/REPORTS:21021675::"Denies"} Difficulty using hands for taps, buttons, cutlery, and/or writing: {ACTIONS;DENIES/REPORTS:21021675::"Denies"}  No Rheumatology ROS completed.   PMFS History:  Patient Active Problem List   Diagnosis Date Noted  . Sicca complex ([place]) 04/01/2017  . DDD (degenerative disc disease), lumbar 04/01/2017  . Psoriasis 04/01/2017  . Skin ulcer of finger, limited to breakdown of skin ([place]) 01/01/2017  . Dry eye 01/01/2017  . Spondylosis of lumbar region without myelopathy or radiculopathy 01/07/2016  . High risk medication use 01/07/2016  . Essential hypertension 09/10/2014  . History of HPV infection 09/10/2014  . History of colonic polyps  09/10/2014  . Osteoporosis 09/10/2014  . Gastroesophageal reflux disease without esophagitis 09/10/2014  . Cigarette smoker 05/11/2011  . Hyperlipidemia 08/22/2010  . Hypothyroidism 08/22/2010  . Psoriatic arthritis ([place]) 08/22/2010    Past Medical History:  Diagnosis Date  . Abnormal Pap smear of cervix   . Anemia    history of anemia  . Arthritis   . Diverticulosis of colon   . Family history of adverse reaction to anesthesia    mother PONV  . GERD (gastroesophageal reflux disease)   . Heart murmur    asymptomatic per pt  . History of adenomatous polyp of colon    11-06-2009  tubullar  . HPV in female   . Hyperlipidemia   . Hypertension   . Hypothyroidism   . Mild aortic valve stenosis    per echo 10-09-2010  ef 60-65%  valva area 1.48cm  . Psoriasis   . Psoriatic arthritis ([place])    Dr. Estanislado Pandy  . Wears glasses     Family History  Problem Relation Age of Onset  . Arthritis Mother        OA   . Psoriasis Mother   . Other Father        accidental death/MVA at age 34  . Hypertension Brother   . Other Brother        1 brother drowned  . Hypertension Maternal Grandmother   . Diabetes Maternal Grandmother   . Hypertension Maternal Grandfather   . Heart disease Neg Hx   . Cancer Neg Hx    Past Surgical History:  Procedure Laterality Date  . ABDOMINAL HYSTERECTOMY    . ANTERIOR LAT LUMBAR  FUSION  12/25/2011   Procedure: ANTERIOR LATERAL LUMBAR FUSION 1 LEVEL;  Surgeon: Eustace Moore, MD;  Location: [facility];  Service: Neurosurgery;  Laterality: Left;  LUMBAR FOUR-FIVE  . BLADDER SUSPENSION N/A 12/19/2014   Procedure: TRANSVAGINAL TAPE (TVT) PROCEDURE;  Surgeon: Everett Graff, MD;  Location: [facility];  Service: Gynecology;  Laterality: N/A;  . CARPAL TUNNEL RELEASE Right April 2015  . CERVICAL CONIZATION W/BX N/A 08/28/2014   Procedure: CONIZATION CERVIX WITH BIOPSY;  Surgeon: Ena Dawley, MD;  Location: [facility];  Service: Gynecology;   Laterality: N/A;  . COLONOSCOPY  11-06-2009  . CYSTO N/A 12/19/2014   Procedure: CYSTO;  Surgeon: Everett Graff, MD;  Location: [facility];  Service: Gynecology;  Laterality: N/A;  . DENTAL SURGERY    . HYSTEROSCOPY W/D&C  04-04-2008  . KNEE ARTHROPLASTY    . LUMBAR PERCUTANEOUS PEDICLE SCREW 1 LEVEL  12/25/2011   Procedure: LUMBAR PERCUTANEOUS PEDICLE SCREW 1 LEVEL;  Surgeon: Eustace Moore, MD;  Location: [facility];  Service: Neurosurgery;  Laterality: Left;  LUMBAR FOUR-FIVE  . PERONEAL NERVE DECOMPRESSION Left 12-27-2009   for neuropathy with  foot drop  . TRANSTHORACIC ECHOCARDIOGRAM  10-09-2010   mild LVH,  ef 60-65%,  grade I diastolic dysfunction,  very mild AV stenosis (area 1.48cm/S/2(VTI) with no regurg./  mild MV calcification without stenosis or regur. /  trivial TR  . VAGINAL HYSTERECTOMY Bilateral 12/19/2014   Procedure: TOTAL VAGINAL HYSTERECTOMY WITH BILATERAL SALPINGECTOMY;  Surgeon: Ena Dawley, MD;  Location: [facility];  Service: Gynecology;  Laterality: Bilateral;   Social History   Social History Narrative  . Not on file   Immunization History  Administered Date(s) Administered  . Influenza Split 12/19/2010, 12/01/2011  . Influenza,inj,Quad PF,6+ Mos 01/21/2016  . Pneumococcal Conjugate-13 01/20/2014  . Pneumococcal Polysaccharide-23 08/01/2010  . Tdap 01/21/2016, 09/15/2017   Immunization History  Administered Date(s) Administered  . Influenza Split 12/19/2010, 12/01/2011  . Influenza,inj,Quad PF,6+ Mos 01/21/2016  . Pneumococcal Conjugate-13 01/20/2014  . Pneumococcal Polysaccharide-23 08/01/2010  . Tdap 01/21/2016, 09/15/2017    Objective: Vital Signs: There were no vitals taken for this visit.   Physical Exam   Musculoskeletal Exam: ***  CDAI Exam: CDAI Score: Not documented Patient Global Assessment: Not documented; Provider Global Assessment: Not documented Swollen: Not documented; Tender: Not documented Joint Exam   Not documented    There is currently no information documented on the homunculus. Go to the Rheumatology activity and complete the homunculus joint exam.  Investigation: No additional findings.  Imaging: No results found.  Recent Labs: Lab Results  Component Value Date   WBC 6.2 11/22/2017   HGB 12.1 11/22/2017   PLT 293 11/22/2017   NA 140 11/22/2017   K 4.0 11/22/2017   CL 105 11/22/2017   CO2 29 11/22/2017   GLUCOSE 89 11/22/2017   BUN 6 (L) 11/22/2017   CREATININE 0.63 11/22/2017   BILITOT 0.4 11/22/2017   ALKPHOS 83 08/30/2017   AST 19 11/22/2017   ALT 16 11/22/2017   PROT 7.1 11/22/2017   ALBUMIN 4.5 08/30/2017   CALCIUM 9.7 11/22/2017   GFRAA 110 11/22/2017   QFTBGOLD Negative 08/02/2015   QFTBGOLDPLUS NEGATIVE 10/05/2017    Speciality Comments: Remicade 6mg / kg every 6 weeks TB gold negative 08/06/16// infuses at [facility]  Procedures:  No procedures performed Allergies: Codeine; Other; Penicillins; and Gluten meal   Assessment / Plan:     Visit Diagnoses: No diagnosis found.   Orders: No orders  of the defined types were placed in this encounter.  No orders of the defined types were placed in this encounter.   Face-to-face time spent with patient was *** minutes. Greater than 50% of time was spent in counseling and coordination of care.  Follow-Up Instructions: No follow-ups on file.   Earnestine Mealing, CMA  Note - This record has been created using Editor, commissioning.  Chart creation errors have been sought, but may not always  have been located. Such creation errors do not reflect on  the standard of medical care.

## 2018-01-04 ENCOUNTER — Encounter: Payer: Self-pay | Admitting: Rheumatology

## 2018-01-04 ENCOUNTER — Ambulatory Visit (INDEPENDENT_AMBULATORY_CARE_PROVIDER_SITE_OTHER): Payer: BLUE CROSS/BLUE SHIELD | Admitting: Rheumatology

## 2018-01-04 VITALS — BP 113/73 | HR 83 | Resp 12 | Ht 65.5 in | Wt 127.8 lb

## 2018-01-04 DIAGNOSIS — M51369 Other intervertebral disc degeneration, lumbar region without mention of lumbar back pain or lower extremity pain: Secondary | ICD-10-CM

## 2018-01-04 DIAGNOSIS — Z8719 Personal history of other diseases of the digestive system: Secondary | ICD-10-CM

## 2018-01-04 DIAGNOSIS — M3500 Sicca syndrome, unspecified: Secondary | ICD-10-CM

## 2018-01-04 DIAGNOSIS — Z79899 Other long term (current) drug therapy: Secondary | ICD-10-CM

## 2018-01-04 DIAGNOSIS — M810 Age-related osteoporosis without current pathological fracture: Secondary | ICD-10-CM | POA: Diagnosis not present

## 2018-01-04 DIAGNOSIS — Z860100 Personal history of colon polyps, unspecified: Secondary | ICD-10-CM

## 2018-01-04 DIAGNOSIS — L409 Psoriasis, unspecified: Secondary | ICD-10-CM | POA: Diagnosis not present

## 2018-01-04 DIAGNOSIS — M5136 Other intervertebral disc degeneration, lumbar region: Secondary | ICD-10-CM

## 2018-01-04 DIAGNOSIS — M19041 Primary osteoarthritis, right hand: Secondary | ICD-10-CM

## 2018-01-04 DIAGNOSIS — L4050 Arthropathic psoriasis, unspecified: Secondary | ICD-10-CM

## 2018-01-04 DIAGNOSIS — Z8639 Personal history of other endocrine, nutritional and metabolic disease: Secondary | ICD-10-CM

## 2018-01-04 DIAGNOSIS — M19042 Primary osteoarthritis, left hand: Secondary | ICD-10-CM

## 2018-01-04 DIAGNOSIS — Z8679 Personal history of other diseases of the circulatory system: Secondary | ICD-10-CM

## 2018-01-04 DIAGNOSIS — F17200 Nicotine dependence, unspecified, uncomplicated: Secondary | ICD-10-CM

## 2018-01-04 MED ORDER — PREDNISONE 5 MG PO TABS: ORAL_TABLET | ORAL | 0 refills | Status: DC

## 2018-01-04 NOTE — Progress Notes (Signed)
Office Visit Note  Patient: April Cox             Date of Birth: May 19, 1955           MRN: [id number]             PCP: Denita Lung, MD Referring: Denita Lung, MD Visit Date: 01/04/2018 Occupation: @GUAROCC @  Subjective:  Pain in multiple joints   History of Present Illness: April Cox is a 62 y.o. female with history of psoriatic arthritis, osteoarthritis, and DDD.  She was started on Cosentyx sq injections on 10/14/17.  She has been injecting Cosentyx 300 mg sq once monthly. Her last injection was 2 weeks ago.  She continues to inject MTX 1.0 ml sq once weekly and folic acid 2 mg po daily.  She was started on a prednisone taper 1 month ago.  She continues to have pain in multiple joints.  She states the pain in severe in the right wrist and right hand. She experiences left shoulder pain at night. She does not know if cosentyx has been effective.  She states she noticed psoriasis on the left ring finger a couple months ago, which has since resolved.   Activities of Daily Living:  Patient reports morning stiffness   all day.   Patient Reports nocturnal pain.  Difficulty dressing/grooming: Denies Difficulty climbing stairs: Reports Difficulty getting out of chair: Reports Difficulty using hands for taps, buttons, cutlery, and/or writing: Reports  Review of Systems  Constitutional: Positive for fatigue.  HENT: Positive for mouth dryness. Negative for mouth sores and nose dryness.   Eyes: Positive for dryness. Negative for pain and visual disturbance.  Respiratory: Negative for cough, hemoptysis, shortness of breath and difficulty breathing.   Cardiovascular: Negative for chest pain, palpitations, hypertension and swelling in legs/feet.  Gastrointestinal: Negative for blood in stool, constipation and diarrhea.  Endocrine: Negative for increased urination.  Genitourinary: Negative for painful urination.  Musculoskeletal: Positive for arthralgias, joint pain, joint  swelling and morning stiffness. Negative for myalgias, muscle weakness, muscle tenderness and myalgias.  Skin: Positive for rash. Negative for color change, pallor, hair loss, nodules/bumps, skin tightness, ulcers and sensitivity to sunlight.  Allergic/Immunologic: Negative for susceptible to infections.  Neurological: Negative for dizziness, numbness, headaches and weakness.  Hematological: Negative for swollen glands.  Psychiatric/Behavioral: Negative for depressed mood and sleep disturbance. The patient is not nervous/anxious.     PMFS History:  Patient Active Problem List   Diagnosis Date Noted  . Sicca complex ([place]) 04/01/2017  . DDD (degenerative disc disease), lumbar 04/01/2017  . Psoriasis 04/01/2017  . Skin ulcer of finger, limited to breakdown of skin ([place]) 01/01/2017  . Dry eye 01/01/2017  . Spondylosis of lumbar region without myelopathy or radiculopathy 01/07/2016  . High risk medication use 01/07/2016  . Essential hypertension 09/10/2014  . History of HPV infection 09/10/2014  . History of colonic polyps 09/10/2014  . Osteoporosis 09/10/2014  . Gastroesophageal reflux disease without esophagitis 09/10/2014  . Cigarette smoker 05/11/2011  . Hyperlipidemia 08/22/2010  . Hypothyroidism 08/22/2010  . Psoriatic arthritis ([place]) 08/22/2010    Past Medical History:  Diagnosis Date  . Abnormal Pap smear of cervix   . Anemia    history of anemia  . Arthritis   . Diverticulosis of colon   . Family history of adverse reaction to anesthesia    mother PONV  . GERD (gastroesophageal reflux disease)   . Heart murmur    asymptomatic per pt  .  History of adenomatous polyp of colon    11-06-2009  tubullar  . HPV in female   . Hyperlipidemia   . Hypertension   . Hypothyroidism   . Mild aortic valve stenosis    per echo 10-09-2010  ef 60-65%  valva area 1.48cm  . Psoriasis   . Psoriatic arthritis ([place])    Dr. Estanislado Pandy  . Wears glasses     Family History  Problem  Relation Age of Onset  . Arthritis Mother        OA   . Psoriasis Mother   . Other Father        accidental death/MVA at age 2  . Hypertension Brother   . Other Brother        1 brother drowned  . Hypertension Maternal Grandmother   . Diabetes Maternal Grandmother   . Hypertension Maternal Grandfather   . Heart disease Neg Hx   . Cancer Neg Hx    Past Surgical History:  Procedure Laterality Date  . ABDOMINAL HYSTERECTOMY    . ANTERIOR LAT LUMBAR FUSION  12/25/2011   Procedure: ANTERIOR LATERAL LUMBAR FUSION 1 LEVEL;  Surgeon: Eustace Moore, MD;  Location: [facility];  Service: Neurosurgery;  Laterality: Left;  LUMBAR FOUR-FIVE  . BLADDER SUSPENSION N/A 12/19/2014   Procedure: TRANSVAGINAL TAPE (TVT) PROCEDURE;  Surgeon: Everett Graff, MD;  Location: [facility];  Service: Gynecology;  Laterality: N/A;  . CARPAL TUNNEL RELEASE Right April 2015  . CERVICAL CONIZATION W/BX N/A 08/28/2014   Procedure: CONIZATION CERVIX WITH BIOPSY;  Surgeon: Ena Dawley, MD;  Location[facility];  Service: Gynecology;  Laterality: N/A;  . COLONOSCOPY  11-06-2009  . CYSTO N/A 12/19/2014   Procedure: CYSTO;  Surgeon: Everett Graff, MD;  Location: [facility];  Service: Gynecology;  Laterality: N/A;  . DENTAL SURGERY    . HYSTEROSCOPY W/D&C  04-04-2008  . KNEE ARTHROPLASTY    . LUMBAR PERCUTANEOUS PEDICLE SCREW 1 LEVEL  12/25/2011   Procedure: LUMBAR PERCUTANEOUS PEDICLE SCREW 1 LEVEL;  Surgeon: Eustace Moore, MD;  Location: [facility];  Service: Neurosurgery;  Laterality: Left;  LUMBAR FOUR-FIVE  . PERONEAL NERVE DECOMPRESSION Left 12-27-2009   for neuropathy with  foot drop  . TRANSTHORACIC ECHOCARDIOGRAM  10-09-2010   mild LVH,  ef 60-65%,  grade I diastolic dysfunction,  very mild AV stenosis (area 1.48cm/S/2(VTI) with no regurg./  mild MV calcification without stenosis or regur. /  trivial TR  . VAGINAL HYSTERECTOMY Bilateral 12/19/2014   Procedure: TOTAL VAGINAL HYSTERECTOMY WITH  BILATERAL SALPINGECTOMY;  Surgeon: Ena Dawley, MD;  Location: [facility];  Service: Gynecology;  Laterality: Bilateral;   Social History   Social History Narrative  . Not on file    Objective: Vital Signs: BP 113/73 (BP Location: Left Arm, Patient Position: Sitting, Cuff Size: Small)   Pulse 83   Resp 12   Ht 5' 5.5" (1.664 m)   Wt 127 lb 12.8 oz (58 kg)   BMI 20.94 kg/m    Physical Exam Vitals signs and nursing note reviewed.  Constitutional:      Appearance: She is well-developed.  HENT:     Head: Normocephalic and atraumatic.  Eyes:     Conjunctiva/sclera: Conjunctivae normal.  Neck:     Musculoskeletal: Normal range of motion.  Cardiovascular:     Rate and Rhythm: Normal rate and regular rhythm.     Heart sounds: Normal heart sounds.  Pulmonary:     Effort: Pulmonary effort  is normal.     Breath sounds: Normal breath sounds.  Abdominal:     General: Bowel sounds are normal.     Palpations: Abdomen is soft.  Lymphadenopathy:     Cervical: No cervical adenopathy.  Skin:    General: Skin is warm and dry.     Capillary Refill: Capillary refill takes less than 2 seconds.  Neurological:     Mental Status: She is alert and oriented to person, place, and time.  Psychiatric:        Behavior: Behavior normal.      Musculoskeletal Exam: C-spine limited ROM.  Thoracic and lumbar spine good ROM.  No midline spinal tenderness.  No SI joint tenderness.  Shoulder joints, elbow joints, wrist joints, MCPs, PIPs, and DIPs good ROM.  Synovitis noted as described below.  Hip joints, knee joints, ankle joints, MTPs, PIPs, and DIPs good ROM.  Right knee warmth but no effusion.  Left knee no warmth or effusion noted.  No tenderness or swelling of ankle joints.  No achilles tendonitis or plantar fasciitis.   CDAI Exam: CDAI Score: 13.4  Patient Global Assessment: 7 (mm); Provider Global Assessment: 7 (mm) Swollen: 6 ; Tender: 6  Joint Exam      Right  Left  Wrist  Swollen Tender      IP  Swollen Tender     PIP 2  Swollen Tender     PIP 3  Swollen Tender     PIP 5  Swollen Tender     Knee  Swollen Tender        Investigation: No additional findings.  Imaging: No results found.  Recent Labs: Lab Results  Component Value Date   WBC 6.2 11/22/2017   HGB 12.1 11/22/2017   PLT 293 11/22/2017   NA 140 11/22/2017   K 4.0 11/22/2017   CL 105 11/22/2017   CO2 29 11/22/2017   GLUCOSE 89 11/22/2017   BUN 6 (L) 11/22/2017   CREATININE 0.63 11/22/2017   BILITOT 0.4 11/22/2017   ALKPHOS 83 08/30/2017   AST 19 11/22/2017   ALT 16 11/22/2017   PROT 7.1 11/22/2017   ALBUMIN 4.5 08/30/2017   CALCIUM 9.7 11/22/2017   GFRAA 110 11/22/2017   QFTBGOLD Negative 08/02/2015   QFTBGOLDPLUS NEGATIVE 10/05/2017    Speciality Comments: Remicade 6mg / kg every 6 weeks TB gold negative 08/06/16// infuses at [facility]  Procedures:  No procedures performed Allergies: Codeine; Other; Penicillins; and Gluten meal   Assessment / Plan:     Visit Diagnoses: Psoriatic arthritis ([place]): She has active synovitis of the right wrist and several PIPs as described above.  She continues to have pain in multiple joints including the right hand, right wrist, bilateral elbow joints, left shoulder, and right knee joint.  She was started on Cosentyx on 10/14/17.  She has been injecting Cosentyx 300 mg sq once a month and MTX 1.0 ml once weekly.  She has not missed any doses.  She has not noticed improvement since starting on Cosentyx.  We discussed trying to inject Cosentyx 150 mg sq every 2 weeks.  A prednisone taper starting at 20 mg and tapering by 5 mg every 4 days was sent to the pharmacy.  She was given information to read about Rutherford Nail as combination therapy.  She will follow up in 6 weeks to see how she is doing on Cosentyx and MTX combination.   Psoriasis: She has no active psoriasis at this time.   High risk medication use -  cosentyx, MTX, folic acid. (previously on remicade IV, inadequate  response to Humira). CBC and CMP were drawn today to monitor for drug toxicity. She will return for lab work in March and every 3 months.- Plan: COMPLETE METABOLIC PANEL WITH GFR, CBC with Differential/Platelet  Age-related osteoporosis without current pathological fracture -She is on Tymlos sq daily injections. She is taking a calcium and vitamin D supplement daily.   Primary osteoarthritis of both hands: She has PIP and DIP synovial thickening.  She has synovitis of several PIP joints as described above.  Joint protection and muscle strengthening were discussed.   Sicca complex [facility]): She continues to have chronic sicca symptoms. She has no parotid swelling.  She uses Xiidra drops BID.  DDD (degenerative disc disease), lumbar: She has no midline spinal tenderness at this time.   Other medical conditions are listed as follows:   History of colonic polyps  History of hypothyroidism  History of gastroesophageal reflux (GERD)  History of hypertension  Smoker   Orders: Orders Placed This Encounter  Procedures  . COMPLETE METABOLIC PANEL WITH GFR  . CBC with Differential/Platelet   Meds ordered this encounter  Medications  . predniSONE (DELTASONE) 5 MG tablet    Sig: Take 4 tabs po x 4 days, 3  tabs po x 4 days, 2  tabs po x 4 days, 1  tab po x 4 days    Dispense:  40 tablet    Refill:  0    Face-to-face time spent with patient was 30 minutes. Greater than 50% of time was spent in counseling and coordination of care.  Follow-Up Instructions: Return in about 6 weeks (around 02/15/2018).   Ofilia Neas, PA-C   I examined and evaluated the patient with Hazel Sams PA.  Patient has been on Cosentyx and methotrexate combination for almost a month now.  She had synovitis in multiple joints on examination as described above.  We had detailed discussion regarding different treatment options.  At this point she wants to give Cosentyx more time.  A prednisone taper was given as described  above.  We may consider switching to Stelara or Rutherford Nail in the future.  The plan of care was discussed as noted above.  Bo Merino, MD  Note - This record has been created using Editor, commissioning.  Chart creation errors have been sought, but may not always  have been located. Such creation errors do not reflect on  the standard of medical care.

## 2018-01-04 NOTE — Patient Instructions (Signed)
Apremilast oral tablets What is this medicine? APREMILAST (a PRE mil ast) is used to treat plaque psoriasis and psoriatic arthritis. This medicine may be used for other purposes; ask your health care provider or pharmacist if you have questions. COMMON BRAND NAME(S): Rutherford Nail What should I tell my health care provider before I take this medicine? They need to know if you have any of these conditions: -dehydration -kidney disease -mental illness -an unusual or allergic reaction to apremilast, other medicines, foods, dyes, or preservatives -pregnant or trying to get pregnant -breast-feeding How should I use this medicine? Take this medicine by mouth with a glass of water. Follow the directions on the prescription label. Do not cut, crush or chew this medicine. You can take it with or without food. If it upsets your stomach, take it with food. Take your medicine at regular intervals. Do not take it more often than directed. Do not stop taking except on your doctor's advice. Talk to your pediatrician regarding the use of this medicine in children. Special care may be needed. Overdosage: If you think you have taken too much of this medicine contact a poison control center or emergency room at once. NOTE: This medicine is only for you. Do not share this medicine with others. What if I miss a dose? If you miss a dose, take it as soon as you can. If it is almost time for your next dose, take only that dose. Do not take double or extra doses. What may interact with this medicine? This medicine may interact with the following medications: -certain medicines for seizures like carbamazepine, phenobarbital, phenytoin -rifampin This list may not describe all possible interactions. Give your health care provider a list of all the medicines, herbs, non-prescription drugs, or dietary supplements you use. Also tell them if you smoke, drink alcohol, or use illegal drugs. Some items may interact with your  medicine. What should I watch for while using this medicine? Tell your doctor or healthcare professional if your symptoms do not start to get better or if they get worse. Patients and their families should watch out for new or worsening depression or thoughts of suicide. Also watch out for sudden changes in feelings such as feeling anxious, agitated, panicky, irritable, hostile, aggressive, impulsive, severely restless, overly excited and hyperactive, or not being able to sleep. If this happens, call your health care professional. Check with your doctor or health care professional if you get an attack of severe diarrhea, nausea and vomiting, or if you sweat a lot. The loss of too much body fluid can make it dangerous for you to take this medicine. What side effects may I notice from receiving this medicine? Side effects that you should report to your doctor or health care professional as soon as possible: -depressed mood -weight loss Side effects that usually do not require medical attention (report to your doctor or health care professional if they continue or are bothersome): -diarrhea -headache -nausea, vomiting This list may not describe all possible side effects. Call your doctor for medical advice about side effects. You may report side effects to FDA at 1-800-FDA-1088. Where should I keep my medicine? Keep out of the reach of children. Store below 30 degrees C (86 degrees F). Throw away any unused medicine after the expiration date. NOTE: This sheet is a summary. It may not cover all possible information. If you have questions about this medicine, talk to your doctor, pharmacist, or health care provider.  2018 Elsevier/Gold Standard (2015-07-24 10:55:44)

## 2018-01-05 ENCOUNTER — Ambulatory Visit: Payer: BLUE CROSS/BLUE SHIELD | Admitting: Rheumatology

## 2018-01-05 LAB — COMPLETE METABOLIC PANEL WITH GFR
AG Ratio: 1.5 (calc) (ref 1.0–2.5)
ALBUMIN MSPROF: 3.9 g/dL (ref 3.6–5.1)
ALT: 14 U/L (ref 6–29)
AST: 13 U/L (ref 10–35)
Alkaline phosphatase (APISO): 87 U/L (ref 33–130)
BUN / CREAT RATIO: 10 (calc) (ref 6–22)
BUN: 6 mg/dL — ABNORMAL LOW (ref 7–25)
CALCIUM: 9.7 mg/dL (ref 8.6–10.4)
CO2: 29 mmol/L (ref 20–32)
CREATININE: 0.62 mg/dL (ref 0.50–0.99)
Chloride: 104 mmol/L (ref 98–110)
GFR, EST AFRICAN AMERICAN: 110 mL/min/{1.73_m2} (ref >=60)
GFR, EST NON AFRICAN AMERICAN: 95 mL/min/{1.73_m2} (ref >=60)
GLOBULIN: 2.6 g/dL (ref 1.9–3.7)
Glucose, Bld: 96 mg/dL (ref 65–99)
Potassium: 4.7 mmol/L (ref 3.5–5.3)
SODIUM: 138 mmol/L (ref 135–146)
Total Bilirubin: 0.4 mg/dL (ref 0.2–1.2)
Total Protein: 6.5 g/dL (ref 6.1–8.1)

## 2018-01-05 LAB — CBC WITH DIFFERENTIAL/PLATELET
Absolute Monocytes: 321 cells/uL (ref 200–950)
BASOS ABS: 0 {cells}/uL (ref 0–200)
Basophils Relative: 0 %
EOS PCT: 1.1 %
Eosinophils Absolute: 69 cells/uL (ref 15–500)
HEMATOCRIT: 35.4 % (ref 35.0–45.0)
HEMOGLOBIN: 12.3 g/dL (ref 11.7–15.5)
LYMPHS ABS: 2514 {cells}/uL (ref 850–3900)
MCH: 33.4 pg — ABNORMAL HIGH (ref 27.0–33.0)
MCHC: 34.7 g/dL (ref 32.0–36.0)
MCV: 96.2 fL (ref 80.0–100.0)
MPV: 10.1 fL (ref 7.5–12.5)
Monocytes Relative: 5.1 %
Neutro Abs: 3396 cells/uL (ref 1500–7800)
Neutrophils Relative %: 53.9 %
Platelets: 389 10*3/uL (ref 140–400)
RBC: 3.68 10*6/uL — ABNORMAL LOW (ref 3.80–5.10)
RDW: 12.4 % (ref 11.0–15.0)
Total Lymphocyte: 39.9 %
WBC: 6.3 10*3/uL (ref 3.8–10.8)

## 2018-01-05 NOTE — Progress Notes (Signed)
Labs are stable.

## 2018-01-07 ENCOUNTER — Telehealth: Payer: Self-pay | Admitting: Rheumatology

## 2018-01-07 MED ORDER — METHOTREXATE SODIUM CHEMO INJECTION 250 MG/10ML: INTRAMUSCULAR | 0 refills | Status: DC

## 2018-01-07 NOTE — Telephone Encounter (Signed)
Patient called requesting prescription refill of Methotrexate to be sent to [place] on [facility].

## 2018-01-07 NOTE — Telephone Encounter (Signed)
Last Visit: 01/04/18 Next Visit: 02/15/18 Labs: 01/04/18 stable  Okay to refill per Dr. Estanislado Pandy

## 2018-02-02 NOTE — Progress Notes (Signed)
Office Visit Note  Patient: April Cox             Date of Birth: Jan 13, 1956           MRN: [id number]             PCP: Denita Lung, MD Referring: Denita Lung, MD Visit Date: 02/15/2018 Occupation: @GUAROCC @  Subjective:  Medication monitoring    History of Present Illness: April Cox is a 63 y.o. female with history of psoriatic arthritis, DDD, and osteoarthritis.  She was started on Cosentyx on 10/14/17.  She contineus to inject MTX 1.0 ml sq once weekly and takes folic acid 2 mg po daily.   She contineus to inject MTX 1.0 ml sq once weekly and takes folic acid 2 mg po daily.  She has noticed improvement since spacing the dose of Cosentyx to 150 mg sq every 14 days.  Her last injection was on Saturday.  She has not missed any doses of MTX recently.  She has not had any recent infections.  She continues to have mild plantar fasciitis bilaterally but feels the discomfort has improved.  She has intermittent bilateral shoulder pain.   She occasionally has right elbow joint pain especially at night in certain positions. She continues to have pain and swelling in the left foot and left wrist joint.  She denies any pain or swelling in her hands. She has mild left SI joint pain.  She denies any achilles tendonitis.  She denies any active psoriasis. She continues to inject Tymlos daily.  She has not had any injection site reactions or side effects.   Activities of Daily Living:  Patient reports morning stiffness for 30-60 minutes.   Patient Reports nocturnal pain.  Difficulty dressing/grooming: Reports Difficulty climbing stairs: Reports Difficulty getting out of chair: Denies Difficulty using hands for taps, buttons, cutlery, and/or writing: Reports  Review of Systems  Constitutional: Positive for fatigue.  HENT: Positive for mouth dryness. Negative for mouth sores and nose dryness.   Eyes: Positive for dryness. Negative for pain and visual disturbance.  Respiratory: Negative  for cough, hemoptysis, shortness of breath and difficulty breathing.   Cardiovascular: Negative for chest pain, palpitations, hypertension and swelling in legs/feet.  Gastrointestinal: Positive for constipation. Negative for blood in stool and diarrhea.  Endocrine: Negative for increased urination.  Genitourinary: Negative for painful urination.  Musculoskeletal: Positive for arthralgias, joint pain, joint swelling, morning stiffness and muscle tenderness. Negative for myalgias, muscle weakness and myalgias.  Skin: Negative for color change, pallor, rash, hair loss, nodules/bumps, skin tightness, ulcers and sensitivity to sunlight.  Allergic/Immunologic: Negative for susceptible to infections.  Neurological: Negative for dizziness, numbness, headaches and weakness.  Hematological: Negative for swollen glands.  Psychiatric/Behavioral: Negative for depressed mood, confusion and sleep disturbance. The patient is not nervous/anxious.     PMFS History:  Patient Active Problem List   Diagnosis Date Noted  . Sicca complex ([facility]) 04/01/2017  . DDD (degenerative disc disease), lumbar 04/01/2017  . Psoriasis 04/01/2017  . Skin ulcer of finger, limited to breakdown of skin ([place]) 01/01/2017  . Dry eye 01/01/2017  . Spondylosis of lumbar region without myelopathy or radiculopathy 01/07/2016  . High risk medication use 01/07/2016  . Essential hypertension 09/10/2014  . History of HPV infection 09/10/2014  . History of colonic polyps 09/10/2014  . Osteoporosis 09/10/2014  . Gastroesophageal reflux disease without esophagitis 09/10/2014  . Cigarette smoker 05/11/2011  . Hyperlipidemia 08/22/2010  . Hypothyroidism 08/22/2010  .  Psoriatic arthritis ([place]) 08/22/2010    Past Medical History:  Diagnosis Date  . Abnormal Pap smear of cervix   . Anemia    history of anemia  . Arthritis   . Diverticulosis of colon   . Family history of adverse reaction to anesthesia    mother PONV  . GERD  (gastroesophageal reflux disease)   . Heart murmur    asymptomatic per pt  . History of adenomatous polyp of colon    11-06-2009  tubullar  . HPV in female   . Hyperlipidemia   . Hypertension   . Hypothyroidism   . Mild aortic valve stenosis    per echo 10-09-2010  ef 60-65%  valva area 1.48cm  . Psoriasis   . Psoriatic arthritis ([place])    Dr. Estanislado Pandy  . Wears glasses     Family History  Problem Relation Age of Onset  . Arthritis Mother        OA   . Psoriasis Mother   . Other Father        accidental death/MVA at age 48  . Hypertension Brother   . Other Brother        1 brother drowned  . Hypertension Maternal Grandmother   . Diabetes Maternal Grandmother   . Hypertension Maternal Grandfather   . Heart disease Neg Hx   . Cancer Neg Hx    Past Surgical History:  Procedure Laterality Date  . ABDOMINAL HYSTERECTOMY    . ANTERIOR LAT LUMBAR FUSION  12/25/2011   Procedure: ANTERIOR LATERAL LUMBAR FUSION 1 LEVEL;  Surgeon: Eustace Moore, MD;  Location: [facility];  Service: Neurosurgery;  Laterality: Left;  LUMBAR FOUR-FIVE  . BLADDER SUSPENSION N/A 12/19/2014   Procedure: TRANSVAGINAL TAPE (TVT) PROCEDURE;  Surgeon: Everett Graff, MD;  Location: [facility];  Service: Gynecology;  Laterality: N/A;  . CARPAL TUNNEL RELEASE Right April 2015  . CERVICAL CONIZATION W/BX N/A 08/28/2014   Procedure: CONIZATION CERVIX WITH BIOPSY;  Surgeon: Ena Dawley, MD;  Location[facility];  Service: Gynecology;  Laterality: N/A;  . COLONOSCOPY  11-06-2009  . CYSTO N/A 12/19/2014   Procedure: CYSTO;  Surgeon: Everett Graff, MD;  Location: [facility];  Service: Gynecology;  Laterality: N/A;  . DENTAL SURGERY    . HYSTEROSCOPY W/D&C  04-04-2008  . KNEE ARTHROPLASTY    . LUMBAR PERCUTANEOUS PEDICLE SCREW 1 LEVEL  12/25/2011   Procedure: LUMBAR PERCUTANEOUS PEDICLE SCREW 1 LEVEL;  Surgeon: Eustace Moore, MD;  Location: [facility];  Service: Neurosurgery;  Laterality: Left;   LUMBAR FOUR-FIVE  . PERONEAL NERVE DECOMPRESSION Left 12-27-2009   for neuropathy with  foot drop  . TRANSTHORACIC ECHOCARDIOGRAM  10-09-2010   mild LVH,  ef 60-65%,  grade I diastolic dysfunction,  very mild AV stenosis (area 1.48cm/S/2(VTI) with no regurg./  mild MV calcification without stenosis or regur. /  trivial TR  . VAGINAL HYSTERECTOMY Bilateral 12/19/2014   Procedure: TOTAL VAGINAL HYSTERECTOMY WITH BILATERAL SALPINGECTOMY;  Surgeon: Ena Dawley, MD;  Location: [facility];  Service: Gynecology;  Laterality: Bilateral;   Social History   Social History Narrative  . Not on file   Immunization History  Administered Date(s) Administered  . Influenza Split 12/19/2010, 12/01/2011  . Influenza,inj,Quad PF,6+ Mos 01/21/2016  . Influenza,inj,quad, With Preservative 12/04/2017  . Pneumococcal Conjugate-13 01/20/2014  . Pneumococcal Polysaccharide-23 08/01/2010  . Tdap 01/21/2016, 09/15/2017  . Zoster Recombinat (Shingrix) 12/11/2017     Objective: Vital Signs: BP 132/82 (BP Location:  Right Arm, Patient Position: Sitting, Cuff Size: Normal)   Pulse 92   Resp 14   Ht 5\' 5"  (1.651 m)   Wt 129 lb 3.2 oz (58.6 kg)   BMI 21.50 kg/m    Physical Exam Vitals signs and nursing note reviewed.  Constitutional:      Appearance: She is well-developed.  HENT:     Head: Normocephalic and atraumatic.  Eyes:     Conjunctiva/sclera: Conjunctivae normal.  Neck:     Musculoskeletal: Normal range of motion.  Cardiovascular:     Rate and Rhythm: Normal rate and regular rhythm.     Heart sounds: Normal heart sounds.  Pulmonary:     Effort: Pulmonary effort is normal.     Breath sounds: Normal breath sounds.  Abdominal:     General: Bowel sounds are normal.     Palpations: Abdomen is soft.  Lymphadenopathy:     Cervical: No cervical adenopathy.  Skin:    General: Skin is warm and dry.     Capillary Refill: Capillary refill takes less than 2 seconds.  Neurological:     Mental Status:  She is alert and oriented to person, place, and time.  Psychiatric:        Behavior: Behavior normal.      Musculoskeletal Exam: C-spine limited ROM.  Thoracic and lumbar spine good ROM.  No midline spinal tenderness.  Left SI joint tenderness.  Shoulder joints good ROM.  Elbow joints, wrist joints, MCPs, PIPs, and DIPs good ROM.  She has incomplete fist formation bilaterally.  Hip joints, knee joints, ankle joints, MTPs, PIPs, and DIPs good ROM with no synovitis.  No warmth or effusion of knee joints.  No tenderness or swelling of ankle joints.  No tenderness over trochanteric bursa bilaterally.  No achilles tendonitis or plantar fasciitis.    CDAI Exam: CDAI Score: Not documented Patient Global Assessment: Not documented; Provider Global Assessment: Not documented Swollen: Not documented; Tender: Not documented Joint Exam   Not documented   There is currently no information documented on the homunculus. Go to the Rheumatology activity and complete the homunculus joint exam.  Investigation: No additional findings.  Imaging: No results found.  Recent Labs: Lab Results  Component Value Date   WBC 6.3 01/04/2018   HGB 12.3 01/04/2018   PLT 389 01/04/2018   NA 138 01/04/2018   K 4.7 01/04/2018   CL 104 01/04/2018   CO2 29 01/04/2018   GLUCOSE 96 01/04/2018   BUN 6 (L) 01/04/2018   CREATININE 0.62 01/04/2018   BILITOT 0.4 01/04/2018   ALKPHOS 83 08/30/2017   AST 13 01/04/2018   ALT 14 01/04/2018   PROT 6.5 01/04/2018   ALBUMIN 4.5 08/30/2017   CALCIUM 9.7 01/04/2018   GFRAA 110 01/04/2018   QFTBGOLD Negative 08/02/2015   QFTBGOLDPLUS NEGATIVE 10/05/2017    Speciality Comments: Remicade 6mg / kg every 6 weeks TB gold negative 08/06/16// infuses at [facility]  Procedures:  No procedures performed Allergies: Codeine; Other; Penicillins; and Gluten meal   Assessment / Plan:     Visit Diagnoses: Psoriatic arthritis ([place]): She has noticed significant improvement since splitting  the dose of Cosentyx to 150 mg every 14 days rather than 300 mg every 28 days.  She continues to inject MTX 1 ml sq once weekly and takes folic acid 2 mg po daily.  She has mild left SI joint tenderness and mild plantar fasciitis bilaterally.  She has no achilles tendonitis.  She has no psoriasis at this  time. She has PIP and DIP synovial thickening in hands and feet.  She has no tenderness in hands or wrist joints.  She experiences occasional discomfort in both shoulder joints.  She has tenderness of the left 1st and 2nd MTPs.  She will continue on the current treatment regimen.  She was advised to notify us if she develops increased joint pain or joint swelling.  She will follow up in 5 months.   Psoriasis: She has no active psoriasis.    High risk medication use - Cosentyx 150 mg every14 days, methotrexate 1 mL every 7 days, and folic acid 2 mg daily.  Last TB gold negative on 10/05/2017.  Most recent CBC/CMP stable on 01/04/2018 and will monitor every 3 months.  Standing orders are in place.  Patient received her flu shot in November and is up-to-date with other recommended vaccines.  She started on Cosentyx on 10/14/17.  (inadequate response to Humira and Remicade)  She has not had any recent infections.   Primary osteoarthritis of both hands: She has PIP and DIP synovial thickening.  She has incomplete fist formation bilaterally.   Sicca complex [facility]): She continues to have sicca symptoms.  She has no parotid swelling or tenderness on exam.   DDD (degenerative disc disease), lumbar: She has no midline spinal tenderness.  She has left SI joint tenderness.   Age-related osteoporosis without current pathological fracture - She is on Tymlos 80 mcg daily along with calcium and vitamin D supplementation.  Previously treated with Fosamax.  Last DEXA from 08/24/2017 showed T score of -4.  Other medical conditions are listed as follows:   History of colonic polyps  History of hypothyroidism  History of  gastroesophageal reflux (GERD)  History of hypertension  Smoker   Orders: No orders of the defined types were placed in this encounter.  No orders of the defined types were placed in this encounter.   Follow-Up Instructions: Return in about 5 months (around 07/17/2018) for Psoriatic arthritis.   Ofilia Neas, PA-C  Note - This record has been created using Dragon software.  Chart creation errors have been sought, but may not always  have been located. Such creation errors do not reflect on  the standard of medical care.

## 2018-02-03 DIAGNOSIS — H04123 Dry eye syndrome of bilateral lacrimal glands: Secondary | ICD-10-CM | POA: Diagnosis not present

## 2018-02-03 DIAGNOSIS — H16223 Keratoconjunctivitis sicca, not specified as Sjogren's, bilateral: Secondary | ICD-10-CM | POA: Diagnosis not present

## 2018-02-14 ENCOUNTER — Other Ambulatory Visit: Payer: Self-pay | Admitting: Rheumatology

## 2018-02-14 DIAGNOSIS — L4050 Arthropathic psoriasis, unspecified: Secondary | ICD-10-CM

## 2018-02-14 NOTE — Telephone Encounter (Signed)
Last Visit: 01/04/18 Next Visit: 02/15/18 Labs: 01/04/18 stable TB Gold: 10/05/17 Neg   Okay to refill per Dr. Estanislado Pandy

## 2018-02-15 ENCOUNTER — Ambulatory Visit (INDEPENDENT_AMBULATORY_CARE_PROVIDER_SITE_OTHER): Payer: BLUE CROSS/BLUE SHIELD | Admitting: Physician Assistant

## 2018-02-15 ENCOUNTER — Encounter: Payer: Self-pay | Admitting: Physician Assistant

## 2018-02-15 VITALS — BP 132/82 | HR 92 | Resp 14 | Ht 65.0 in | Wt 129.2 lb

## 2018-02-15 DIAGNOSIS — M19041 Primary osteoarthritis, right hand: Secondary | ICD-10-CM

## 2018-02-15 DIAGNOSIS — Z8639 Personal history of other endocrine, nutritional and metabolic disease: Secondary | ICD-10-CM

## 2018-02-15 DIAGNOSIS — Z79899 Other long term (current) drug therapy: Secondary | ICD-10-CM

## 2018-02-15 DIAGNOSIS — Z86010 Personal history of colonic polyps: Secondary | ICD-10-CM

## 2018-02-15 DIAGNOSIS — M19042 Primary osteoarthritis, left hand: Secondary | ICD-10-CM

## 2018-02-15 DIAGNOSIS — L4050 Arthropathic psoriasis, unspecified: Secondary | ICD-10-CM

## 2018-02-15 DIAGNOSIS — Z8719 Personal history of other diseases of the digestive system: Secondary | ICD-10-CM

## 2018-02-15 DIAGNOSIS — Z8679 Personal history of other diseases of the circulatory system: Secondary | ICD-10-CM

## 2018-02-15 DIAGNOSIS — M3500 Sicca syndrome, unspecified: Secondary | ICD-10-CM

## 2018-02-15 DIAGNOSIS — L409 Psoriasis, unspecified: Secondary | ICD-10-CM | POA: Diagnosis not present

## 2018-02-15 DIAGNOSIS — M5136 Other intervertebral disc degeneration, lumbar region: Secondary | ICD-10-CM

## 2018-02-15 DIAGNOSIS — F17200 Nicotine dependence, unspecified, uncomplicated: Secondary | ICD-10-CM

## 2018-02-15 DIAGNOSIS — M810 Age-related osteoporosis without current pathological fracture: Secondary | ICD-10-CM

## 2018-02-15 NOTE — Patient Instructions (Signed)
Standing Labs We placed an order today for your standing lab work.    Please come back and get your standing labs in March and every 3 months  We have open lab Monday through Friday from 8:30-11:30 AM and 1:30-4:00 PM  at the office of Dr. Shaili Deveshwar.   You may experience shorter wait times on Monday and Friday afternoons. The office is located at [address], [address], [place], [place] [address] No appointment is necessary.   Labs are drawn by [facility].  You may receive a bill from [facility] for your lab work.  If you wish to have your labs drawn at another location, please call the office 24 hours in advance to send orders.  If you have any questions regarding directions or hours of operation,  please call [phone].   Just as a reminder please drink plenty of water prior to coming for your lab work. Thanks!  

## 2018-03-11 DIAGNOSIS — H2513 Age-related nuclear cataract, bilateral: Secondary | ICD-10-CM | POA: Diagnosis not present

## 2018-03-11 DIAGNOSIS — H16223 Keratoconjunctivitis sicca, not specified as Sjogren's, bilateral: Secondary | ICD-10-CM | POA: Diagnosis not present

## 2018-03-11 DIAGNOSIS — H25013 Cortical age-related cataract, bilateral: Secondary | ICD-10-CM | POA: Diagnosis not present

## 2018-03-11 DIAGNOSIS — H3509 Other intraretinal microvascular abnormalities: Secondary | ICD-10-CM | POA: Diagnosis not present

## 2018-03-11 DIAGNOSIS — H04123 Dry eye syndrome of bilateral lacrimal glands: Secondary | ICD-10-CM | POA: Diagnosis not present

## 2018-03-21 ENCOUNTER — Other Ambulatory Visit: Payer: Self-pay | Admitting: Rheumatology

## 2018-03-21 NOTE — Telephone Encounter (Signed)
Last Visit: 02/15/18 Next Visit: 07/11/18 Labs: 01/04/18 stable  Okay to refill per Dr. Estanislado Pandy

## 2018-03-22 ENCOUNTER — Telehealth: Payer: Self-pay | Admitting: Rheumatology

## 2018-03-22 NOTE — Telephone Encounter (Signed)
Attempted to contact the patient and left message for patient to call the office.  

## 2018-03-22 NOTE — Telephone Encounter (Signed)
Patient called stating she went to pick up her prescription of Methotrexate and was told by [facility] that there were no refills remaining.  Patient states she was due to take her injection on 03/19/18.  Patient states the pharmacy also told her "the prescription has not been updated since Dr. Estanislado Pandy increased her dose last year and that is why she ran out."  Patient is requesting a return call.

## 2018-03-23 NOTE — Telephone Encounter (Signed)
Spoke with patient and advised we have updated prescription. Patient states she was able to pick it up from the pharmacy with updated directions.

## 2018-04-05 ENCOUNTER — Other Ambulatory Visit: Payer: Self-pay

## 2018-04-05 DIAGNOSIS — Z79899 Other long term (current) drug therapy: Secondary | ICD-10-CM

## 2018-04-06 LAB — CBC WITH DIFFERENTIAL/PLATELET
Absolute Monocytes: 317 cells/uL (ref 200–950)
Basophils Absolute: 7 cells/uL (ref 0–200)
Basophils Relative: 0.1 %
EOS PCT: 1.2 %
Eosinophils Absolute: 86 cells/uL (ref 15–500)
HEMATOCRIT: 39.6 % (ref 35.0–45.0)
HEMOGLOBIN: 13.3 g/dL (ref 11.7–15.5)
LYMPHS ABS: 2354 {cells}/uL (ref 850–3900)
MCH: 32 pg (ref 27.0–33.0)
MCHC: 33.6 g/dL (ref 32.0–36.0)
MCV: 95.4 fL (ref 80.0–100.0)
MPV: 10.6 fL (ref 7.5–12.5)
Monocytes Relative: 4.4 %
NEUTROS ABS: 4435 {cells}/uL (ref 1500–7800)
NEUTROS PCT: 61.6 %
Platelets: 368 10*3/uL (ref 140–400)
RBC: 4.15 10*6/uL (ref 3.80–5.10)
RDW: 13 % (ref 11.0–15.0)
Total Lymphocyte: 32.7 %
WBC: 7.2 10*3/uL (ref 3.8–10.8)

## 2018-04-06 LAB — COMPLETE METABOLIC PANEL WITH GFR
AG Ratio: 1.6 (calc) (ref 1.0–2.5)
ALT: 9 U/L (ref 6–29)
AST: 14 U/L (ref 10–35)
Albumin: 4.5 g/dL (ref 3.6–5.1)
Alkaline phosphatase (APISO): 90 U/L (ref 37–153)
BUN: 7 mg/dL (ref 7–25)
CALCIUM: 10 mg/dL (ref 8.6–10.4)
CO2: 27 mmol/L (ref 20–32)
CREATININE: 0.73 mg/dL (ref 0.50–0.99)
Chloride: 104 mmol/L (ref 98–110)
GFR, EST NON AFRICAN AMERICAN: 87 mL/min/{1.73_m2} (ref >=60)
GFR, Est African American: 101 mL/min/{1.73_m2} (ref >=60)
GLOBULIN: 2.9 g/dL (ref 1.9–3.7)
GLUCOSE: 113 mg/dL — AB (ref 65–99)
Potassium: 4.1 mmol/L (ref 3.5–5.3)
SODIUM: 140 mmol/L (ref 135–146)
Total Bilirubin: 0.7 mg/dL (ref 0.2–1.2)
Total Protein: 7.4 g/dL (ref 6.1–8.1)

## 2018-04-18 ENCOUNTER — Other Ambulatory Visit: Payer: Self-pay | Admitting: Family Medicine

## 2018-04-18 ENCOUNTER — Other Ambulatory Visit: Payer: Self-pay | Admitting: Rheumatology

## 2018-04-18 DIAGNOSIS — I10 Essential (primary) hypertension: Secondary | ICD-10-CM

## 2018-04-18 NOTE — Telephone Encounter (Signed)
Last Visit: 02/15/18 Next Visit: 07/11/18 Labs: 04/05/18 Glucose is 113. Rest of CMP. CBC WNL.  Okay to refill per Dr. Estanislado Pandy

## 2018-05-13 ENCOUNTER — Other Ambulatory Visit: Payer: Self-pay | Admitting: *Deleted

## 2018-05-13 DIAGNOSIS — L4050 Arthropathic psoriasis, unspecified: Secondary | ICD-10-CM

## 2018-05-13 MED ORDER — SECUKINUMAB 150 MG/ML ~~LOC~~ SOAJ: 150.0000 mg | SUBCUTANEOUS | 0 refills | Status: DC

## 2018-05-13 NOTE — Telephone Encounter (Signed)
Last Visit: 02/15/18 Next Visit: 07/11/18 Labs: 04/05/18 Glucose is 113. Rest of CMP. CBC WNL. TB Gold: 10/05/17 Neg   Okay to refill per Dr. Estanislado Pandy

## 2018-05-24 ENCOUNTER — Ambulatory Visit (INDEPENDENT_AMBULATORY_CARE_PROVIDER_SITE_OTHER): Payer: BLUE CROSS/BLUE SHIELD | Admitting: Family Medicine

## 2018-05-24 ENCOUNTER — Other Ambulatory Visit: Payer: Self-pay

## 2018-05-24 VITALS — Wt 127.2 lb

## 2018-05-24 DIAGNOSIS — W57XXXA Bitten or stung by nonvenomous insect and other nonvenomous arthropods, initial encounter: Secondary | ICD-10-CM | POA: Diagnosis not present

## 2018-05-24 DIAGNOSIS — S00261A Insect bite (nonvenomous) of right eyelid and periocular area, initial encounter: Secondary | ICD-10-CM

## 2018-05-24 NOTE — Progress Notes (Signed)
   Subjective:    Patient ID: April Cox, female    DOB: [birth year], 63 y.o.   MRN: [id number]  HPI Documentation for virtual telephone encounter.  Documentation for virtual audio and video telecommunications through [facility] encounter: The patient was located at home. The provider was located in the office. The patient did consent to this visit and is aware of possible charges through their insurance for this visit. The other persons participating in this telemedicine service were none. Time spent on call was 1 minutes and in review of previous records >8 minutes total.  This virtual service is not related to other E/M service within previous 7 days. She states that on Saturday she noted a slight irritation to the midportion of her right upper eyelid.  He got slightly more erythematous yesterday.  Today it is not hot or tender.  She notes no problems with vision.     Review of Systems     Objective:   Physical Exam Alert and in no distress.  Visual inspection of the upper eyelid does show a slight central swelling with some surrounding erythema but not the entire eyelid.       Assessment & Plan:  Insect bite of right eyelid, initial encounter I explained that I thought this was probably from an insect bite since it is very localized with the central area that probably represents is staying and some surrounding slight erythema but limited erythema not covering the entire upper eyelid. Recommend heat several times per day.  She will call if it gets worse and I might possibly place her on an antibiotic.  She was comfortable with that.

## 2018-06-23 ENCOUNTER — Telehealth: Payer: Self-pay | Admitting: Pharmacist

## 2018-06-23 NOTE — Telephone Encounter (Signed)
Received a Prior Authorization request from [facility] for [facility] [facility]. Authorization has been submitted to patient's insurance via Cover My Meds. Will update once we receive a response.  Key: [place], PharmD, Para [place], CPP Rheumatology Clinical Pharmacist  06/23/2018 8:20 AM

## 2018-06-24 ENCOUNTER — Other Ambulatory Visit: Payer: Self-pay | Admitting: *Deleted

## 2018-06-24 ENCOUNTER — Telehealth: Payer: Self-pay | Admitting: Rheumatology

## 2018-06-24 MED ORDER — CLOBETASOL PROPIONATE 0.05 % EX CREA: 1.0000 "application " | TOPICAL_CREAM | Freq: Two times a day (BID) | CUTANEOUS | 0 refills | Status: DC

## 2018-06-24 NOTE — Telephone Encounter (Signed)
Patient has a small outbreak of Psoriasis on bilateral palms of hands. Patient states it is itchy. Please call patient to advise. Patient uses [facility] on [address].

## 2018-06-24 NOTE — Telephone Encounter (Signed)
error 

## 2018-06-24 NOTE — Telephone Encounter (Signed)
Attempted to contact the patient and left message for patient to call the office.  

## 2018-06-24 NOTE — Telephone Encounter (Signed)
Okay to call in prescription for clobetasol cream 0.05% to be used twice daily for 2 weeks.

## 2018-06-24 NOTE — Telephone Encounter (Signed)
Patient states she is having an outbreak of psoriasis. Patient states the area is itching and on the palms of both hands. Patient states blisters are starting to come up. Patient is requesting a prescription for a cream to put on the area. Please advise.

## 2018-06-24 NOTE — Addendum Note (Signed)
Addended by: Carole Binning on: 06/24/2018 12:11 PM   Modules accepted: Orders

## 2018-06-24 NOTE — Telephone Encounter (Signed)
Patient advised prescription sent to the pharmacy.  

## 2018-06-27 NOTE — Progress Notes (Signed)
Office Visit Note  Patient: April Cox             Date of Birth: July 04, 1955           MRN: [id number]             PCP: Denita Lung, MD Referring: Denita Lung, MD Visit Date: 07/11/2018 Occupation: @GUAROCC @  Subjective:  Pain in both hands     History of Present Illness: April Cox is a 63 y.o. female with history of psoriatic arthritis, osteoarthritis, osteoporosis, and DDD.  She is on Cosentyx 150 mg sq injections every 14 days, MTX 1 ml sq once weekly, and folic acid 2 mg po daily.  She was started on Cosentyx in September 2019. She has not had any recent infections.  She has not missed any doses of Cosentyx or MTX recently.  She reports about 2 weeks ago she had a flare of her psoriasis on her hands and was having increased pain in her bilateral hands and bilateral wrist joints.  She states that she applied clobetasol which has resolved her psoriasis.  She states that her flare resolved on its own without prednisone.  She denies any increased morning stiffness.  She states her morning stiffness usually lasts 10-30 minutes.  She denies any achilles tendonitis or plantar fasciitis. She denies any SI joint pain currently.  She has intermittent lower back pain.      Activities of Daily Living:  Patient reports morning stiffness for 10 minutes.   Patient Reports nocturnal pain.  Difficulty dressing/grooming: Denies Difficulty climbing stairs: Denies Difficulty getting out of chair: Denies Difficulty using hands for taps, buttons, cutlery, and/or writing: Reports  Review of Systems  Constitutional: Positive for fatigue.  HENT: Positive for mouth dryness. Negative for mouth sores and nose dryness.   Eyes: Positive for dryness. Negative for pain, itching and visual disturbance.  Respiratory: Negative for cough, hemoptysis, shortness of breath, wheezing and difficulty breathing.   Cardiovascular: Negative for chest pain, palpitations, hypertension and swelling in  legs/feet.  Gastrointestinal: Negative for abdominal pain, blood in stool, constipation and diarrhea.  Endocrine: Negative for increased urination.  Genitourinary: Negative for painful urination and pelvic pain.  Musculoskeletal: Positive for arthralgias, joint pain, joint swelling and morning stiffness. Negative for myalgias, muscle weakness, muscle tenderness and myalgias.  Skin: Negative for color change, pallor, rash, hair loss, nodules/bumps, redness, skin tightness, ulcers and sensitivity to sunlight.  Allergic/Immunologic: Negative for susceptible to infections.  Neurological: Negative for dizziness, numbness, headaches, memory loss and weakness.  Hematological: Negative for swollen glands.  Psychiatric/Behavioral: Negative for depressed mood, confusion and sleep disturbance. The patient is not nervous/anxious.     PMFS History:  Patient Active Problem List   Diagnosis Date Noted  . Sicca complex ([place]) 04/01/2017  . DDD (degenerative disc disease), lumbar 04/01/2017  . Psoriasis 04/01/2017  . Skin ulcer of finger, limited to breakdown of skin ([place]) 01/01/2017  . Dry eye 01/01/2017  . Spondylosis of lumbar region without myelopathy or radiculopathy 01/07/2016  . High risk medication use 01/07/2016  . Essential hypertension 09/10/2014  . History of HPV infection 09/10/2014  . History of colonic polyps 09/10/2014  . Osteoporosis 09/10/2014  . Gastroesophageal reflux disease without esophagitis 09/10/2014  . Cigarette smoker 05/11/2011  . Hyperlipidemia 08/22/2010  . Hypothyroidism 08/22/2010  . Psoriatic arthritis ([place]) 08/22/2010    Past Medical History:  Diagnosis Date  . Abnormal Pap smear of cervix   . Anemia  history of anemia  . Arthritis   . Diverticulosis of colon   . Family history of adverse reaction to anesthesia    mother PONV  . GERD (gastroesophageal reflux disease)   . Heart murmur    asymptomatic per pt  . History of adenomatous polyp of colon     11-06-2009  tubullar  . HPV in female   . Hyperlipidemia   . Hypertension   . Hypothyroidism   . Mild aortic valve stenosis    per echo 10-09-2010  ef 60-65%  valva area 1.48cm  . Psoriasis   . Psoriatic arthritis ([place])    Dr. Estanislado Pandy  . Wears glasses     Family History  Problem Relation Age of Onset  . Arthritis Mother        OA   . Psoriasis Mother   . Other Father        accidental death/MVA at age 21  . Hypertension Brother   . Other Brother        1 brother drowned  . Hypertension Maternal Grandmother   . Diabetes Maternal Grandmother   . Hypertension Maternal Grandfather   . Heart disease Neg Hx   . Cancer Neg Hx    Past Surgical History:  Procedure Laterality Date  . ABDOMINAL HYSTERECTOMY    . ANTERIOR LAT LUMBAR FUSION  12/25/2011   Procedure: ANTERIOR LATERAL LUMBAR FUSION 1 LEVEL;  Surgeon: Eustace Moore, MD;  Location: [facility];  Service: Neurosurgery;  Laterality: Left;  LUMBAR FOUR-FIVE  . BLADDER SUSPENSION N/A 12/19/2014   Procedure: TRANSVAGINAL TAPE (TVT) PROCEDURE;  Surgeon: Everett Graff, MD;  Location: [facility];  Service: Gynecology;  Laterality: N/A;  . CARPAL TUNNEL RELEASE Right April 2015  . CERVICAL CONIZATION W/BX N/A 08/28/2014   Procedure: CONIZATION CERVIX WITH BIOPSY;  Surgeon: Ena Dawley, MD;  [facility];  Service: Gynecology;  Laterality: N/A;  . COLONOSCOPY  11-06-2009  . CYSTO N/A 12/19/2014   Procedure: CYSTO;  Surgeon: Everett Graff, MD;  Location: [place] [facility];  Service: Gynecology;  Laterality: N/A;  . DENTAL SURGERY    . HYSTEROSCOPY W/D&C  04-04-2008  . KNEE ARTHROPLASTY    . LUMBAR PERCUTANEOUS PEDICLE SCREW 1 LEVEL  12/25/2011   Procedure: LUMBAR PERCUTANEOUS PEDICLE SCREW 1 LEVEL;  Surgeon: Eustace Moore, MD;  Location: [facility];  Service: Neurosurgery;  Laterality: Left;  LUMBAR FOUR-FIVE  . PERONEAL NERVE DECOMPRESSION Left 12-27-2009   for neuropathy with  foot drop  . TRANSTHORACIC  ECHOCARDIOGRAM  10-09-2010   mild LVH,  ef 60-65%,  grade I diastolic dysfunction,  very mild AV stenosis (area 1.48cm/S/2(VTI) with no regurg./  mild MV calcification without stenosis or regur. /  trivial TR  . VAGINAL HYSTERECTOMY Bilateral 12/19/2014   Procedure: TOTAL VAGINAL HYSTERECTOMY WITH BILATERAL SALPINGECTOMY;  Surgeon: Ena Dawley, MD;  Location: [facility];  Service: Gynecology;  Laterality: Bilateral;   Social History   Social History Narrative  . Not on file   Immunization History  Administered Date(s) Administered  . Influenza Split 12/19/2010, 12/01/2011  . Influenza,inj,Quad PF,6+ Mos 01/21/2016  . Influenza,inj,quad, With Preservative 12/04/2017  . Pneumococcal Conjugate-13 01/20/2014  . Pneumococcal Polysaccharide-23 08/01/2010  . Tdap 01/21/2016, 09/15/2017  . Zoster Recombinat (Shingrix) 12/11/2017, 02/27/2018     Objective: Vital Signs: BP (!) 167/96 (BP Location: Right Arm, Patient Position: Sitting, Cuff Size: Normal)   Pulse (!) 103   Resp 12   Ht 5\' 6"  (1.676 m)  Wt 123 lb 3.2 oz (55.9 kg)   BMI 19.89 kg/m    Physical Exam Vitals signs and nursing note reviewed.  Constitutional:      Appearance: She is well-developed.  HENT:     Head: Normocephalic and atraumatic.  Eyes:     Conjunctiva/sclera: Conjunctivae normal.  Neck:     Musculoskeletal: Normal range of motion.  Cardiovascular:     Rate and Rhythm: Normal rate and regular rhythm.     Heart sounds: Normal heart sounds.  Pulmonary:     Effort: Pulmonary effort is normal.     Breath sounds: Normal breath sounds.  Abdominal:     General: Bowel sounds are normal.     Palpations: Abdomen is soft.  Lymphadenopathy:     Cervical: No cervical adenopathy.  Skin:    General: Skin is warm and dry.     Capillary Refill: Capillary refill takes less than 2 seconds.  Neurological:     Mental Status: She is alert and oriented to person, place, and time.  Psychiatric:        Behavior: Behavior  normal.      Musculoskeletal Exam: C-spine,thoracic spine, and lumbar spine good ROM.  No midline spinal tenderness.  No SI joint tenderness.  Shoulder joints and elbow joints good ROM.  Limited ROM of both wrist joints.  Synovitis of both wrist joints.  MCPs good ROM with no synovitis.  PIP and DIP synovial thickening. Tenderness and synovitis of the right 2nd and 3rd PIP joints.  Hip joints, knee joints, ankle joints, MTPs, PIPs, and DIPs good ROM.  No warmth or effusion of knee joints.  No tenderness or swelling of ankle joints.  Tenderness and synovitis of left 1st MTP and PIP joint. No achilles tendonitis or plantar fasciitis.    CDAI Exam: CDAI Score: 8.6  Patient Global: 3 mm; Provider Global: 3 mm Swollen: 6 ; Tender: 6  Joint Exam      Right  Left  Wrist  Swollen Tender  Swollen Tender  PIP 2  Swollen Tender     PIP 3  Swollen Tender     MTP 1     Swollen Tender  PIP 1 (toe)     Swollen Tender     Investigation: No additional findings.  Imaging: No results found.  Recent Labs: Lab Results  Component Value Date   WBC 7.2 04/05/2018   HGB 13.3 04/05/2018   PLT 368 04/05/2018   NA 140 04/05/2018   K 4.1 04/05/2018   CL 104 04/05/2018   CO2 27 04/05/2018   GLUCOSE 113 (H) 04/05/2018   BUN 7 04/05/2018   CREATININE 0.73 04/05/2018   BILITOT 0.7 04/05/2018   ALKPHOS 83 08/30/2017   AST 14 04/05/2018   ALT 9 04/05/2018   PROT 7.4 04/05/2018   ALBUMIN 4.5 08/30/2017   CALCIUM 10.0 04/05/2018   GFRAA 101 04/05/2018   QFTBGOLD Negative 08/02/2015   QFTBGOLDPLUS NEGATIVE 10/05/2017    Speciality Comments: Prior therapy: Remicade and Humira (inadequate response) Osteoporosis managed by PCP. Last DXA August 2019.  Procedures:  No procedures performed Allergies: Codeine, Other, Penicillins, and Gluten meal     Assessment / Plan:     Visit Diagnoses: Psoriatic arthritis ([place]) -She has active synovitis as described above.  She had a psoriatic arthritis flare  about 2 weeks ago. Marland Kitchen  She was having increased pain in bilateral hands and bilateral wrist joints.  She continues have active synovitis of bilateral wrists and the  right second and third PIP joints. She states that at that time she also experienced a flare of her psoriasis  Her psoriasis cleared with topical clobetasol.  She continues to inject Cosentyx 150 mg every 14 days and methotrexate 1 mL subcutaneously once weekly.  She has not missed any doses recently.  She denies any recent infections.  Overall she feels as though Cosentyx and methotrexate are effective combination and would like to continue.  She does not need any refills at this time.  She was advised to notify us if she develops increased joint pain or joint swelling.  She will follow-up in the office in 5 months  Psoriasis - She has no active psoriasis at this time.  She has a flare of psoriasis about 2 weeks ago.  She used clobetasol topically prn and the psoriasis resolved.   High risk medication use- Cosentyx 150 mg every 14 days (started September 2019), methotrexate 1 mL every 7 days, and folic acid 1 mg 2 tablets daily. Inadequate response to Remicade in the past. Last TB gold negative on 10/05/2017 and will monitor yearly.  Most recent CBC/CMP within normal limits on 04/05/2018.  Due for CBC/CMP today and will monitor every 3 months.  Standing orders are in place.  She received the flu vaccine in November and is up-to-date on pneumonia and shingles vaccines.-  Plan: CBC with Differential/Platelet, COMPLETE METABOLIC PANEL WITH GFR  Primary osteoarthritis of both hands -She has PIP and DIP synovial thickening consistent with osteoarthritis and psoriatic arthritis overlap.  She has active synovitis as described above.  Joint protection and muscle strengthening were discussed.   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  She uses Xiidra and Refresh eye drops PRN for eye dryness.     DDD (degenerative disc disease),  lumbar -She has intermittent lower back pain.  She has no symptoms of radiculopathy or sciatica.  She has good ROM with no midline spinal tenderness at this time.   Age-related osteoporosis without current pathological fracture - She is on Tymlos 80 mcg daily prescribed by Dr. Redmond School along with calcium and vitamin D supplementation.  Previously treated with Fosamax with unknown duration. Last DEXA from 08/24/2017 showed T score of -4.1.  Other medical conditions are listed as follows:   History of hypothyroidism  History of hypertension  History of colonic polyps   History of gastroesophageal reflux (GERD)   Smoker   Orders: Orders Placed This Encounter  Procedures  . CBC with Differential/Platelet  . COMPLETE METABOLIC PANEL WITH GFR   No orders of the defined types were placed in this encounter.    Follow-Up Instructions: Return in about 5 months (around 12/11/2018) for Psoriatic arthritis.   Ofilia Neas, PA-C   I examined and evaluated the patient with [facility] Sams PA.  Patient continues to have some synovitis on my examination as described above.  She does not want to change therapy and is satisfied with the treatment plan.  We discussed updating x-rays of her joints at the next visit.  The plan of care was discussed as noted above.  Bo Merino, MD  Note - This record has been created using Editor, commissioning.  Chart creation errors have been sought, but may not always  have been located. Such creation errors do not reflect on  the standard of medical care.

## 2018-07-11 ENCOUNTER — Ambulatory Visit (INDEPENDENT_AMBULATORY_CARE_PROVIDER_SITE_OTHER): Payer: BC Managed Care – PPO | Admitting: Physician Assistant

## 2018-07-11 ENCOUNTER — Encounter: Payer: Self-pay | Admitting: Physician Assistant

## 2018-07-11 ENCOUNTER — Other Ambulatory Visit: Payer: Self-pay

## 2018-07-11 VITALS — BP 167/96 | HR 103 | Resp 12 | Ht 66.0 in | Wt 123.2 lb

## 2018-07-11 DIAGNOSIS — M19041 Primary osteoarthritis, right hand: Secondary | ICD-10-CM | POA: Diagnosis not present

## 2018-07-11 DIAGNOSIS — L4050 Arthropathic psoriasis, unspecified: Secondary | ICD-10-CM

## 2018-07-11 DIAGNOSIS — Z8679 Personal history of other diseases of the circulatory system: Secondary | ICD-10-CM

## 2018-07-11 DIAGNOSIS — Z8639 Personal history of other endocrine, nutritional and metabolic disease: Secondary | ICD-10-CM

## 2018-07-11 DIAGNOSIS — M3500 Sicca syndrome, unspecified: Secondary | ICD-10-CM

## 2018-07-11 DIAGNOSIS — Z86010 Personal history of colonic polyps: Secondary | ICD-10-CM

## 2018-07-11 DIAGNOSIS — Z79899 Other long term (current) drug therapy: Secondary | ICD-10-CM

## 2018-07-11 DIAGNOSIS — M19042 Primary osteoarthritis, left hand: Secondary | ICD-10-CM

## 2018-07-11 DIAGNOSIS — Z8719 Personal history of other diseases of the digestive system: Secondary | ICD-10-CM

## 2018-07-11 DIAGNOSIS — M810 Age-related osteoporosis without current pathological fracture: Secondary | ICD-10-CM

## 2018-07-11 DIAGNOSIS — M5136 Other intervertebral disc degeneration, lumbar region: Secondary | ICD-10-CM

## 2018-07-11 DIAGNOSIS — L409 Psoriasis, unspecified: Secondary | ICD-10-CM | POA: Diagnosis not present

## 2018-07-11 DIAGNOSIS — F17200 Nicotine dependence, unspecified, uncomplicated: Secondary | ICD-10-CM

## 2018-07-14 ENCOUNTER — Other Ambulatory Visit: Payer: Self-pay | Admitting: Family Medicine

## 2018-07-14 DIAGNOSIS — E785 Hyperlipidemia, unspecified: Secondary | ICD-10-CM

## 2018-07-15 ENCOUNTER — Other Ambulatory Visit: Payer: Self-pay

## 2018-07-15 DIAGNOSIS — Z79899 Other long term (current) drug therapy: Secondary | ICD-10-CM

## 2018-07-16 LAB — CBC WITH DIFFERENTIAL/PLATELET
Absolute Monocytes: 269 cells/uL (ref 200–950)
Basophils Absolute: 8 cells/uL (ref 0–200)
Basophils Relative: 0.1 %
Eosinophils Absolute: 87 cells/uL (ref 15–500)
Eosinophils Relative: 1.1 %
HCT: 40.9 % (ref 35.0–45.0)
Hemoglobin: 13.7 g/dL (ref 11.7–15.5)
Lymphs Abs: 2394 cells/uL (ref 850–3900)
MCH: 32.8 pg (ref 27.0–33.0)
MCHC: 33.5 g/dL (ref 32.0–36.0)
MCV: 97.8 fL (ref 80.0–100.0)
MPV: 10.2 fL (ref 7.5–12.5)
Monocytes Relative: 3.4 %
Neutro Abs: 5143 cells/uL (ref 1500–7800)
Neutrophils Relative %: 65.1 %
Platelets: 332 10*3/uL (ref 140–400)
RBC: 4.18 10*6/uL (ref 3.80–5.10)
RDW: 14.1 % (ref 11.0–15.0)
Total Lymphocyte: 30.3 %
WBC: 7.9 10*3/uL (ref 3.8–10.8)

## 2018-07-16 LAB — COMPLETE METABOLIC PANEL WITH GFR
AG Ratio: 1.5 (calc) (ref 1.0–2.5)
ALT: 9 U/L (ref 6–29)
AST: 14 U/L (ref 10–35)
Albumin: 4.2 g/dL (ref 3.6–5.1)
Alkaline phosphatase (APISO): 83 U/L (ref 37–153)
BUN/Creatinine Ratio: 10 (calc) (ref 6–22)
BUN: 6 mg/dL — ABNORMAL LOW (ref 7–25)
CO2: 26 mmol/L (ref 20–32)
Calcium: 10 mg/dL (ref 8.6–10.4)
Chloride: 102 mmol/L (ref 98–110)
Creat: 0.61 mg/dL (ref 0.50–0.99)
GFR, Est African American: 111 mL/min/{1.73_m2} (ref >=60)
GFR, Est Non African American: 96 mL/min/{1.73_m2} (ref >=60)
Globulin: 2.8 g/dL (calc) (ref 1.9–3.7)
Glucose, Bld: 101 mg/dL — ABNORMAL HIGH (ref 65–99)
Potassium: 4.2 mmol/L (ref 3.5–5.3)
Sodium: 137 mmol/L (ref 135–146)
Total Bilirubin: 0.5 mg/dL (ref 0.2–1.2)
Total Protein: 7 g/dL (ref 6.1–8.1)

## 2018-07-18 ENCOUNTER — Ambulatory Visit: Payer: BLUE CROSS/BLUE SHIELD | Admitting: Physician Assistant

## 2018-07-23 ENCOUNTER — Other Ambulatory Visit: Payer: Self-pay | Admitting: Family Medicine

## 2018-07-23 DIAGNOSIS — K219 Gastro-esophageal reflux disease without esophagitis: Secondary | ICD-10-CM

## 2018-07-30 ENCOUNTER — Other Ambulatory Visit: Payer: Self-pay | Admitting: Rheumatology

## 2018-08-01 NOTE — Telephone Encounter (Signed)
Last Visit: 07/11/18 Next Visit: 12/13/18 Labs: 07/15/18 WNL  Okay to refill per Dr. Estanislado Pandy

## 2018-09-07 ENCOUNTER — Other Ambulatory Visit: Payer: Self-pay | Admitting: *Deleted

## 2018-09-07 DIAGNOSIS — L4050 Arthropathic psoriasis, unspecified: Secondary | ICD-10-CM

## 2018-09-07 MED ORDER — SECUKINUMAB 150 MG/ML ~~LOC~~ SOAJ: 150.0000 mg | SUBCUTANEOUS | 0 refills | Status: DC

## 2018-09-07 NOTE — Telephone Encounter (Signed)
Refill request received via fax  Last Visit: 07/11/18 Next Visit: 12/13/18 Labs: 07/15/18 WNL TB Gold: 10/06/18  Okay to refill per Dr. Estanislado Pandy

## 2018-09-17 ENCOUNTER — Other Ambulatory Visit: Payer: Self-pay | Admitting: Rheumatology

## 2018-09-19 NOTE — Telephone Encounter (Signed)
Last Visit: 07/11/18 Next Visit: 12/13/18  Okay to refill per Dr. Estanislado Pandy

## 2018-09-20 DIAGNOSIS — Z1211 Encounter for screening for malignant neoplasm of colon: Secondary | ICD-10-CM | POA: Diagnosis not present

## 2018-09-20 DIAGNOSIS — Z13820 Encounter for screening for osteoporosis: Secondary | ICD-10-CM | POA: Diagnosis not present

## 2018-09-20 DIAGNOSIS — Z1231 Encounter for screening mammogram for malignant neoplasm of breast: Secondary | ICD-10-CM | POA: Diagnosis not present

## 2018-09-20 DIAGNOSIS — Z01419 Encounter for gynecological examination (general) (routine) without abnormal findings: Secondary | ICD-10-CM | POA: Diagnosis not present

## 2018-09-20 LAB — HM DEXA SCAN

## 2018-09-21 DIAGNOSIS — M8580 Other specified disorders of bone density and structure, unspecified site: Secondary | ICD-10-CM | POA: Insufficient documentation

## 2018-09-22 ENCOUNTER — Encounter: Payer: Self-pay | Admitting: Family Medicine

## 2018-09-25 ENCOUNTER — Other Ambulatory Visit: Payer: Self-pay | Admitting: Rheumatology

## 2018-09-27 NOTE — Telephone Encounter (Signed)
Last Visit:07/11/18 Next Visit:12/13/18  Okay to refill per Dr. Estanislado Pandy

## 2018-09-30 ENCOUNTER — Telehealth: Payer: Self-pay | Admitting: Family Medicine

## 2018-09-30 NOTE — Telephone Encounter (Signed)
Pt called & states wanted to see if you got results of BDS from [place] said she was not happy with results, said arm was worse, and wanted to know your recommendations? Has been on Tymlos it for a year & due for refill if you are wanting her to stay on that.

## 2018-10-03 NOTE — Telephone Encounter (Signed)
Lvm for pt KH 

## 2018-10-03 NOTE — Telephone Encounter (Signed)
Let her know that we like to wait at least 2 years before we recheck the bone density so at the one-year mark  is not a good timeframe.

## 2018-10-04 NOTE — Telephone Encounter (Signed)
Pt wanted to know since her latest Dexa scan show worsen forearm  bone mass and hip show no change. Pt stated she is taking tymlos and would like to know what she should do or if there is a new med she could add to improve fore arm bone mass. Pt vitamin d was low and OBGYN has placed her on 50,000 units. Please advise

## 2018-10-05 MED ORDER — TYMLOS 3120 MCG/1.56ML ~~LOC~~ SOPN: 80.0000 ug | PEN_INJECTOR | Freq: Every day | SUBCUTANEOUS | 3 refills | Status: DC

## 2018-10-05 NOTE — Telephone Encounter (Signed)
Patient given message [phone]. She states she does need a refill on the tymlos. 90 day supply to [facility], is this ok?

## 2018-10-05 NOTE — Telephone Encounter (Signed)
I think improving her vitamin D and redoing this test in another year is appropriate.  Hard to really evaluate a scan after only 1 year.  Standard is always to wait 2 years because change in bones takes a long time

## 2018-10-11 ENCOUNTER — Other Ambulatory Visit: Payer: Self-pay

## 2018-10-11 ENCOUNTER — Other Ambulatory Visit (INDEPENDENT_AMBULATORY_CARE_PROVIDER_SITE_OTHER): Payer: BC Managed Care – PPO

## 2018-10-11 DIAGNOSIS — Z23 Encounter for immunization: Secondary | ICD-10-CM | POA: Diagnosis not present

## 2018-10-20 ENCOUNTER — Other Ambulatory Visit: Payer: Self-pay | Admitting: Family Medicine

## 2018-10-20 DIAGNOSIS — K219 Gastro-esophageal reflux disease without esophagitis: Secondary | ICD-10-CM

## 2018-11-10 ENCOUNTER — Other Ambulatory Visit: Payer: Self-pay

## 2018-11-29 DIAGNOSIS — Z13820 Encounter for screening for osteoporosis: Secondary | ICD-10-CM | POA: Diagnosis not present

## 2018-11-29 NOTE — Progress Notes (Deleted)
Virtual Visit via Video Note  I connected with [name] on 12/13/18 at 11:00 AM EST by a video enabled telemedicine application and verified that I am speaking with the correct person using two identifiers.  Location: Patient: Home  Provider: Clinic   This service was conducted via virtual visit.  Both audio and visual tools were used.  The patient was located at home. I was located in my office.  Consent was obtained prior to the virtual visit and is aware of possible charges through their insurance for this visit.  The patient is an established patient.  Dr. Estanislado Pandy, MD conducted the virtual visit and [place] Sams, PA-C acted as scribe during the service.  Office staff helped with scheduling follow up visits after the service was conducted.     I discussed the limitations of evaluation and management by telemedicine and the availability of in person appointments. The patient expressed understanding and agreed to proceed.   CC:  History of Present Illness: Patient is a 63 year old female with a past medical history of psoriatic arthritis.   Review of Systems  Constitutional: Negative for fever and malaise/fatigue.  Eyes: Negative for photophobia, pain, discharge and redness.  Respiratory: Negative for cough, shortness of breath and wheezing.   Cardiovascular: Negative for chest pain and palpitations.  Gastrointestinal: Negative for blood in stool, constipation and diarrhea.  Genitourinary: Negative for dysuria.  Musculoskeletal: Negative for back pain, joint pain, myalgias and neck pain.  Skin: Negative for rash.  Neurological: Negative for dizziness and headaches.  Psychiatric/Behavioral: Negative for depression. The patient is not nervous/anxious and does not have insomnia.       Observations/Objective: Physical Exam  Constitutional: She is oriented to person, place, and time and well-developed, well-nourished, and in no distress.  HENT:  Head: Normocephalic and  atraumatic.  Eyes: Conjunctivae are normal.  Pulmonary/Chest: Effort normal.  Neurological: She is alert and oriented to person, place, and time.  Psychiatric: Mood, memory, affect and judgment normal.   Patient reports morning stiffness for   {minute/hour:19697}.   Patient {Actions; denies-reports:120008} nocturnal pain.  Difficulty dressing/grooming: {ACTIONS;DENIES/REPORTS:21021675::"Denies"} Difficulty climbing stairs: {ACTIONS;DENIES/REPORTS:21021675::"Denies"} Difficulty getting out of chair: {ACTIONS;DENIES/REPORTS:21021675::"Denies"} Difficulty using hands for taps, buttons, cutlery, and/or writing: {ACTIONS;DENIES/REPORTS:21021675::"Denies"}   Assessment and Plan: Visit Diagnoses: Psoriatic arthritis ([place]) -  Psoriasis -   High risk medication use- Cosentyx 150 mg every 14 days (started September 2019), methotrexate 1 mL every 7 days, and folic acid 1 mg 2 tablets daily. Inadequate response to Remicade in the past. Last TB gold negative on 10/05/2017 and will monitor yearly.  Primary osteoarthritis of both hands   Sicca complex ([place]) -  She has chronic dry eyes and dry mouth.  She uses Xiidra and Refresh eye drops PRN for eye dryness.     DDD (degenerative disc disease), lumbar -  Age-related osteoporosis without current pathological fracture - She is on Tymlos 80 mcg daily prescribed by Dr. Redmond School along with calcium and vitamin D supplementation.  Previously treated with Fosamax with unknown duration.Last DEXA from 08/24/2017 showed T score of -4.1.  Other medical conditions are listed as follows:   History of hypothyroidism  History of hypertension  History of colonic polyps   History of gastroesophageal reflux (GERD)   Smoker   Follow Up Instructions: She will follow up in    I discussed the assessment and treatment plan with the patient. The patient was provided an opportunity to ask questions and all were answered. The patient  agreed with the plan  and demonstrated an understanding of the instructions.   The patient was advised to call back or seek an in-person evaluation if the symptoms worsen or if the condition fails to improve as anticipated.  I provided *** minutes of non-face-to-face time during this encounter.   Ofilia Neas, PA-C  Cosentyx 150 mg every 14 days, methotrexate 1 mg every 7 days, and folic acid 1 mg 2 tablets daily.  Last TB gold negative on 10/05/2017.  Due for TB gold today and will monitor yearly.  Most recent CBC/CMP within normal limits on 07/15/2018.  Due for CBC/CMP today and will monitor every 3 months.

## 2018-12-01 ENCOUNTER — Other Ambulatory Visit: Payer: Self-pay | Admitting: *Deleted

## 2018-12-01 DIAGNOSIS — L4050 Arthropathic psoriasis, unspecified: Secondary | ICD-10-CM

## 2018-12-01 NOTE — Telephone Encounter (Signed)
Refill request received via fax  Last Visit:07/11/18 Next Visit:12/13/18 Labs: 07/15/18 WNL TB Gold: 10/05/17 Neg   Patient to update labs on 12/13/18 at appointment   Okay to refill 30 day supply Cosentyx?

## 2018-12-02 MED ORDER — SECUKINUMAB 150 MG/ML ~~LOC~~ SOAJ: 150.0000 mg | SUBCUTANEOUS | 0 refills | Status: DC

## 2018-12-02 NOTE — Telephone Encounter (Signed)
ok 

## 2018-12-13 ENCOUNTER — Other Ambulatory Visit: Payer: Self-pay

## 2018-12-13 ENCOUNTER — Encounter: Payer: BC Managed Care – PPO | Admitting: Rheumatology

## 2018-12-17 ENCOUNTER — Other Ambulatory Visit: Payer: Self-pay | Admitting: Rheumatology

## 2018-12-17 DIAGNOSIS — Z79899 Other long term (current) drug therapy: Secondary | ICD-10-CM

## 2018-12-19 NOTE — Telephone Encounter (Signed)
Patient is due overdue to update lab work.  Ok to send in 30 day supply of MTX.

## 2018-12-19 NOTE — Telephone Encounter (Signed)
Notes recorded by Ofilia Neas, PA-C on 07/18/2018 at 8:13 AM EDT  Labs are WNL  Last RF 08/01/2018 Last appt 07/11/2018 Next appt 12/21/2018 TB Gold negative 10/05/2017

## 2018-12-20 NOTE — Telephone Encounter (Addendum)
Advised patient she is due to update labs, patient verbalized understanding and will [facility] to update (due to her husband having open heart surgery this week). Patient states she has medication due to pharmacy providing "emergency 30 day supply."

## 2018-12-20 NOTE — Progress Notes (Signed)
Virtual Visit via Video Note  I connected with April Cox on 12/21/18 at  2:45 PM EST by a video enabled telemedicine application and verified that I am speaking with the correct person using two identifiers.  Location: Patient: Home  Provider: Clinic  This service was conducted via virtual visit.  Both audio and visual tools were used.  The patient was located at home. I was located in my office.  Consent was obtained prior to the virtual visit and is aware of possible charges through their insurance for this visit.  The patient is an established patient.  Dr. Estanislado Pandy, MD conducted the virtual visit and Hazel Sams, PA-C acted as scribe during the service.  Office staff helped with scheduling follow up visits after the service was conducted.   I discussed the limitations of evaluation and management by telemedicine and the availability of in person appointments. The patient expressed understanding and agreed to proceed.  CC: Left trochanteric bursitis  History of Present Illness: Patient is a 63 year old female with a past medical history of psoriatic arthritis, osteoarthritis, and osteoporosis. She is on  Cosentyx 150 mg every 14 days (started September 2019), methotrexate 1 mL every 7 days, and folic acid 1 mg 2 tablets daily. She continues to have occasional flares but no severe, long-lasting flares.  she is not having any joint swelling in her hands at this time.  She is having left trochanteric bursitis.  She denies any groin pain. She states she has been sitting 8 hours per day.  She is retiring in 4 weeks. She has tried taking aleve and ibuprofen but has not had much pain relief.  She experiences nocturnal pain.   Review of Systems  Constitutional: Negative for fever and malaise/fatigue.  HENT:       +Dry mouth  Eyes: Negative for photophobia, pain, discharge and redness.       +Dry eyes  Respiratory: Negative for cough, shortness of breath and wheezing.   Cardiovascular: Negative  for chest pain and palpitations.  Gastrointestinal: Negative for blood in stool, constipation and diarrhea.  Genitourinary: Negative for dysuria.  Musculoskeletal: Positive for joint pain. Negative for back pain, myalgias and neck pain.       +Morning stiffness   Skin: Negative for rash.  Neurological: Negative for dizziness and headaches.  Psychiatric/Behavioral: Negative for depression. The patient is not nervous/anxious and does not have insomnia.       Observations/Objective: Physical Exam  Constitutional: She is oriented to person, place, and time and well-developed, well-nourished, and in no distress.  HENT:  Head: Normocephalic and atraumatic.  Eyes: Conjunctivae are normal.  Pulmonary/Chest: Effort normal.  Neurological: She is alert and oriented to person, place, and time.  Psychiatric: Mood, memory, affect and judgment normal.   Patient reports morning stiffness for  15-30  minutes.   Patient reports nocturnal pain.  Difficulty dressing/grooming: Denies Difficulty climbing stairs: Denies Difficulty getting out of chair: Denies Difficulty using hands for taps, buttons, cutlery, and/or writing: Reports   Assessment and Plan: Visit Diagnoses: Psoriatic arthritis ([place]) -She has occasional flares of psoriatic arthritis.  She has not had more frequent or severe flares recently.  She is not having any joint swelling at this time.  She continues to have pain and stiffness in both hands lasting about 15-30 minutes every morning. She has no achilles tendonitis, plantar fasciitis, SI joint pain, or psoriasis at this time.  She is on Cosentyx 150 mg sq injections every 14 days,  MTX 1 ml sq once weekly, and folic acid 2 mg po daily.  She will continue on this current treatment regimen.  She does not need any refills at this time.  She is overdue to update lab work.  She was advised to notify us if she develops increased joint pain or joint swelling.  She will follow up in 3 months.    Psoriasis: She has no psoriasis.  She has not had any patches of psoriasis in over 1 year.   High risk medication use- Cosentyx 150 mg every 14 days (started September 2019), methotrexate 1 mL every 7 days, and folic acid 1 mg 2 tablets daily. Inadequate response to Remicade in the past. Last TB gold negative on 10/05/17.  TB gold is overdue.  Order was placed today. CBC and CMP were drawn on 07/15/18.    Primary osteoarthritis of both hands: She has PIP and DIP synovial thickening.  She has stiffness in both hands for about 15-30 minutes in the morning.  Joint protection and muscle strengthening were discussed.    Trochanteric bursitis, left hip: She has been having symptoms of left trochanteric bursitis over the past several months.  She has been experiencing nocturnal pain.  She has been sitting for 8 hours a day, which is likely exacerbating her discomfort.  We discussed stretching exercises on a regular basis. She is going to look up more stretching exercises online. She was encouraged to change positions more frequently as well.   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   She has chronic sicca symptoms.   DDD (degenerative disc disease), lumbar -She has no increased lower back pain at this time.  She has no symptoms of radiculopathy at this time.   Age-related osteoporosis without current pathological fracture - She is on Tymlos 80 mcg daily prescribed by Dr. Redmond School along with calcium and vitamin D supplementation.  Previously treated with Fosamax with unknown duration.Last DEXA from 08/24/2017 showed T score of -4.1.  Other medical conditions are listed as follows:   History of hypothyroidism  History of hypertension  History of colonic polyps   History of gastroesophageal reflux (GERD)   Smoker   Follow Up Instructions: She will follow up in 3 months.    I discussed the assessment and treatment plan with the patient. The  patient was provided an opportunity to ask questions and all were answered. The patient agreed with the plan and demonstrated an understanding of the instructions.   The patient was advised to call back or seek an in-person evaluation if the symptoms worsen or if the condition fails to improve as anticipated.  I provided 15 minutes of non-face-to-face time during this encounter.   Bo Merino, MD   Scribed by-  Hazel Sams, PA-C

## 2018-12-20 NOTE — Telephone Encounter (Signed)
Please contact patient. Thank you. 

## 2018-12-20 NOTE — Progress Notes (Signed)
This encounter was created in error - please disregard.

## 2018-12-21 ENCOUNTER — Telehealth (INDEPENDENT_AMBULATORY_CARE_PROVIDER_SITE_OTHER): Payer: BC Managed Care – PPO | Admitting: Rheumatology

## 2018-12-21 ENCOUNTER — Encounter: Payer: Self-pay | Admitting: Rheumatology

## 2018-12-21 ENCOUNTER — Other Ambulatory Visit: Payer: Self-pay

## 2018-12-21 DIAGNOSIS — Z8639 Personal history of other endocrine, nutritional and metabolic disease: Secondary | ICD-10-CM

## 2018-12-21 DIAGNOSIS — Z79899 Other long term (current) drug therapy: Secondary | ICD-10-CM

## 2018-12-21 DIAGNOSIS — M19042 Primary osteoarthritis, left hand: Secondary | ICD-10-CM

## 2018-12-21 DIAGNOSIS — M5136 Other intervertebral disc degeneration, lumbar region: Secondary | ICD-10-CM

## 2018-12-21 DIAGNOSIS — F17200 Nicotine dependence, unspecified, uncomplicated: Secondary | ICD-10-CM

## 2018-12-21 DIAGNOSIS — L4050 Arthropathic psoriasis, unspecified: Secondary | ICD-10-CM | POA: Diagnosis not present

## 2018-12-21 DIAGNOSIS — Z8679 Personal history of other diseases of the circulatory system: Secondary | ICD-10-CM

## 2018-12-21 DIAGNOSIS — M19041 Primary osteoarthritis, right hand: Secondary | ICD-10-CM | POA: Diagnosis not present

## 2018-12-21 DIAGNOSIS — L409 Psoriasis, unspecified: Secondary | ICD-10-CM

## 2018-12-21 DIAGNOSIS — M3500 Sicca syndrome, unspecified: Secondary | ICD-10-CM

## 2018-12-21 DIAGNOSIS — Z86010 Personal history of colonic polyps: Secondary | ICD-10-CM

## 2018-12-21 DIAGNOSIS — M7062 Trochanteric bursitis, left hip: Secondary | ICD-10-CM

## 2018-12-21 DIAGNOSIS — Z8719 Personal history of other diseases of the digestive system: Secondary | ICD-10-CM

## 2018-12-21 DIAGNOSIS — M810 Age-related osteoporosis without current pathological fracture: Secondary | ICD-10-CM

## 2018-12-26 ENCOUNTER — Other Ambulatory Visit: Payer: Self-pay | Admitting: Rheumatology

## 2018-12-26 DIAGNOSIS — L4050 Arthropathic psoriasis, unspecified: Secondary | ICD-10-CM

## 2019-01-04 ENCOUNTER — Telehealth: Payer: Self-pay | Admitting: Rheumatology

## 2019-01-04 NOTE — Telephone Encounter (Signed)
-----   Message from Shona Needles, [place] sent at 12/22/2018  4:02 PM EST ----- Regarding: F/U IN 3 MONTHS

## 2019-01-04 NOTE — Telephone Encounter (Signed)
I LMOM for patient to call, and schedule her follow up appointment around 03/21/2019.

## 2019-01-06 ENCOUNTER — Other Ambulatory Visit: Payer: Self-pay

## 2019-01-06 DIAGNOSIS — Z111 Encounter for screening for respiratory tuberculosis: Secondary | ICD-10-CM

## 2019-01-06 DIAGNOSIS — Z79899 Other long term (current) drug therapy: Secondary | ICD-10-CM | POA: Diagnosis not present

## 2019-01-08 LAB — QUANTIFERON-TB GOLD PLUS
Mitogen-NIL: >10 IU/mL
NIL: 0.03 IU/mL
QuantiFERON-TB Gold Plus: NEGATIVE
TB1-NIL: 0.01 IU/mL
TB2-NIL: 0 IU/mL

## 2019-01-08 LAB — CBC WITH DIFFERENTIAL/PLATELET
Absolute Monocytes: 296 cells/uL (ref 200–950)
Basophils Absolute: 8 cells/uL (ref 0–200)
Basophils Relative: 0.1 %
Eosinophils Absolute: 70 cells/uL (ref 15–500)
Eosinophils Relative: 0.9 %
HCT: 39.9 % (ref 35.0–45.0)
Hemoglobin: 13.4 g/dL (ref 11.7–15.5)
Lymphs Abs: 2418 cells/uL (ref 850–3900)
MCH: 33.4 pg — ABNORMAL HIGH (ref 27.0–33.0)
MCHC: 33.6 g/dL (ref 32.0–36.0)
MCV: 99.5 fL (ref 80.0–100.0)
MPV: 10.1 fL (ref 7.5–12.5)
Monocytes Relative: 3.8 %
Neutro Abs: 5008 cells/uL (ref 1500–7800)
Neutrophils Relative %: 64.2 %
Platelets: 277 10*3/uL (ref 140–400)
RBC: 4.01 10*6/uL (ref 3.80–5.10)
RDW: 13.1 % (ref 11.0–15.0)
Total Lymphocyte: 31 %
WBC: 7.8 10*3/uL (ref 3.8–10.8)

## 2019-01-08 LAB — COMPLETE METABOLIC PANEL WITH GFR
AG Ratio: 1.5 (calc) (ref 1.0–2.5)
ALT: 12 U/L (ref 6–29)
AST: 17 U/L (ref 10–35)
Albumin: 4.1 g/dL (ref 3.6–5.1)
Alkaline phosphatase (APISO): 93 U/L (ref 37–153)
BUN/Creatinine Ratio: 9 (calc) (ref 6–22)
BUN: 5 mg/dL — ABNORMAL LOW (ref 7–25)
CO2: 24 mmol/L (ref 20–32)
Calcium: 9.8 mg/dL (ref 8.6–10.4)
Chloride: 104 mmol/L (ref 98–110)
Creat: 0.57 mg/dL (ref 0.50–0.99)
GFR, Est African American: 113 mL/min/{1.73_m2} (ref >=60)
GFR, Est Non African American: 97 mL/min/{1.73_m2} (ref >=60)
Globulin: 2.7 g/dL (calc) (ref 1.9–3.7)
Glucose, Bld: 92 mg/dL (ref 65–99)
Potassium: 4.2 mmol/L (ref 3.5–5.3)
Sodium: 139 mmol/L (ref 135–146)
Total Bilirubin: 0.5 mg/dL (ref 0.2–1.2)
Total Protein: 6.8 g/dL (ref 6.1–8.1)

## 2019-01-09 ENCOUNTER — Telehealth: Payer: Self-pay

## 2019-01-09 DIAGNOSIS — L4050 Arthropathic psoriasis, unspecified: Secondary | ICD-10-CM

## 2019-01-09 MED ORDER — SECUKINUMAB 150 MG/ML ~~LOC~~ SOAJ: 150.0000 mg | SUBCUTANEOUS | 0 refills | Status: DC

## 2019-01-09 NOTE — Telephone Encounter (Signed)
Refill request received via fax from [facility] for cosentyx.   Last Visit: 12/21/2018 telemedicine  Next Visit:message sent to the front desk to schedule.  Labs: 01/06/2019 CBC and CMP stable.  TB Gold: 01/06/2019 negative   Okay to refill per Dr. Estanislado Pandy.

## 2019-01-19 ENCOUNTER — Other Ambulatory Visit: Payer: Self-pay | Admitting: Family Medicine

## 2019-01-19 DIAGNOSIS — K219 Gastro-esophageal reflux disease without esophagitis: Secondary | ICD-10-CM

## 2019-02-27 ENCOUNTER — Ambulatory Visit: Payer: BC Managed Care – PPO | Attending: Internal Medicine

## 2019-02-27 ENCOUNTER — Ambulatory Visit: Payer: BC Managed Care – PPO

## 2019-02-27 DIAGNOSIS — Z23 Encounter for immunization: Secondary | ICD-10-CM

## 2019-02-27 NOTE — Progress Notes (Signed)
   Covid-19 Vaccination Clinic  Name:  April Cox    MRN: [id number] DOB: [birth year]  02/27/2019  April Cox was observed post Covid-19 immunization for 15 minutes without incidence. She was provided with Vaccine Information Sheet and instruction to access the V-Safe system.   April Cox was instructed to call 911 with any severe reactions post vaccine: Marland Kitchen Difficulty breathing  . Swelling of your face and throat  . A fast heartbeat  . A bad rash all over your body  . Dizziness and weakness    Immunizations Administered    Name Date Dose VIS Date Route   Pfizer COVID-19 Vaccine 02/27/2019 10:57 AM 0.3 mL 12/30/2018 Intramuscular   Manufacturer: [place]   Lot: [id number]   [place]: [id number]

## 2019-03-10 ENCOUNTER — Other Ambulatory Visit: Payer: Self-pay | Admitting: Rheumatology

## 2019-03-10 NOTE — Telephone Encounter (Signed)
Last Visit: 12/21/18 Next Visit: due March 2021. Message sent to the front to schedule patient.   Labs: 01/06/19 CBC and CMP stable.   Okay to refill per Dr. Estanislado Pandy

## 2019-03-10 NOTE — Telephone Encounter (Signed)
Please schedule patient for a follow up visit. Patient due March 2021. Thanks!  

## 2019-03-10 NOTE — Telephone Encounter (Signed)
LMOM for patient to call and schedule follow-up appointment.   °

## 2019-03-14 DIAGNOSIS — H3509 Other intraretinal microvascular abnormalities: Secondary | ICD-10-CM | POA: Diagnosis not present

## 2019-03-14 DIAGNOSIS — H25013 Cortical age-related cataract, bilateral: Secondary | ICD-10-CM | POA: Diagnosis not present

## 2019-03-14 DIAGNOSIS — H524 Presbyopia: Secondary | ICD-10-CM | POA: Diagnosis not present

## 2019-03-14 DIAGNOSIS — H35363 Drusen (degenerative) of macula, bilateral: Secondary | ICD-10-CM | POA: Diagnosis not present

## 2019-03-14 DIAGNOSIS — H04123 Dry eye syndrome of bilateral lacrimal glands: Secondary | ICD-10-CM | POA: Diagnosis not present

## 2019-03-14 DIAGNOSIS — H2513 Age-related nuclear cataract, bilateral: Secondary | ICD-10-CM | POA: Diagnosis not present

## 2019-03-14 IMAGING — CT CT HEAD W/O CM
3 series · 14 of 47 positions shown, 16 images · non-contrast
Comparison: None.

CLINICAL DATA: 62-year-old female with fall and head trauma.

EXAM:
CT HEAD WITHOUT CONTRAST
TECHNIQUE: Contiguous axial images were obtained from the base of the skull
through the vertex without intravenous contrast.

[Series 2: head wo · axial · 0.47mm/px · z∈[-102,+34]mm · 8 of 33 slices shown, 10 images]
[im 3/33  brain]
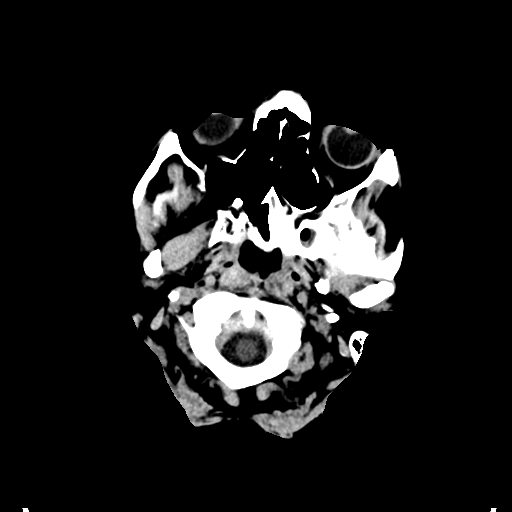
[im 3/33  bone]
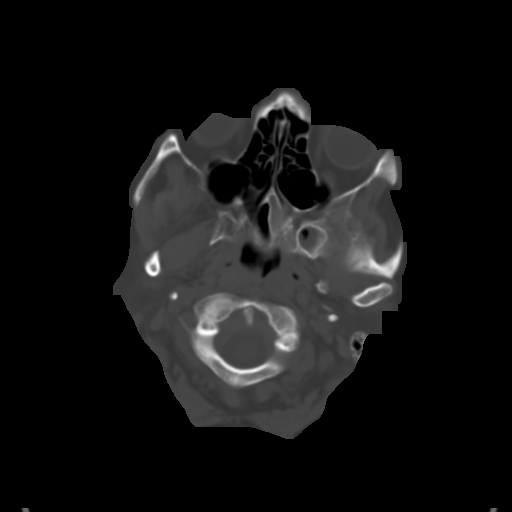
[im 7/33  brain]
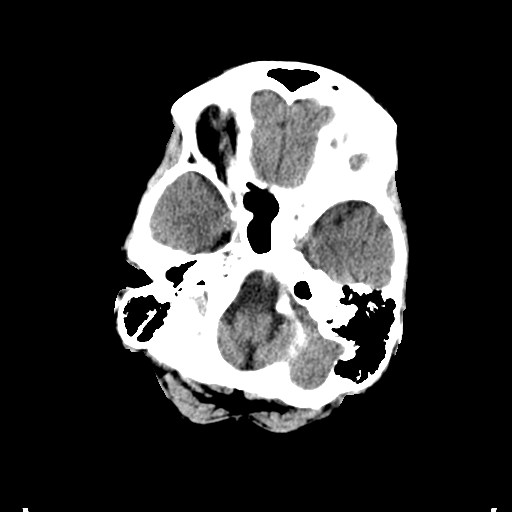
[im 10/33  brain]
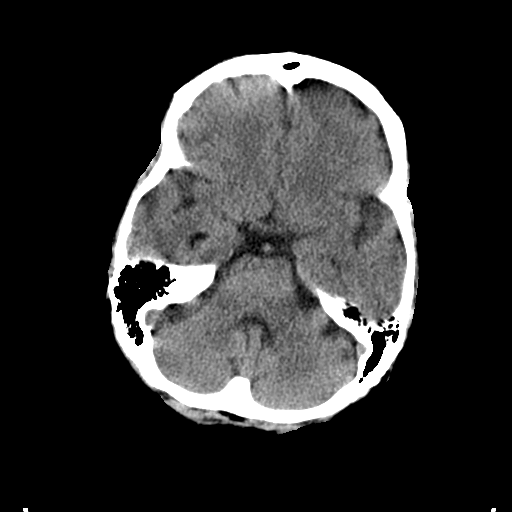
[im 15/33  brain]
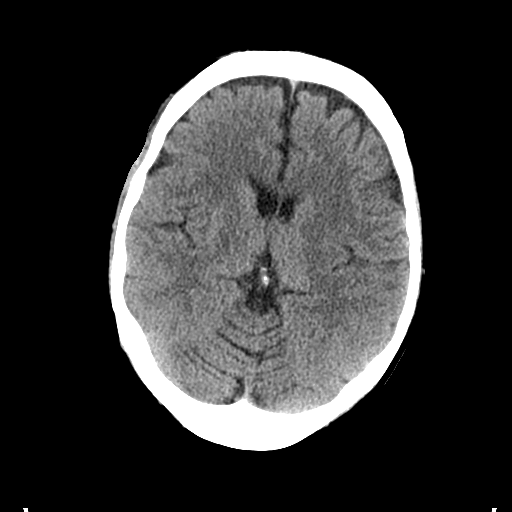
[im 18/33  brain]
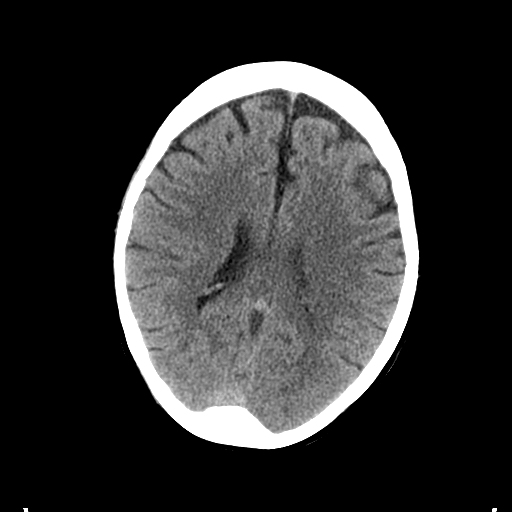
[im 18/33  bone]
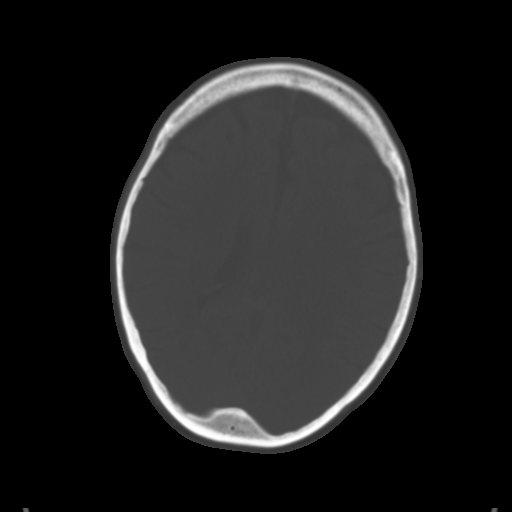
[im 23/33  brain]
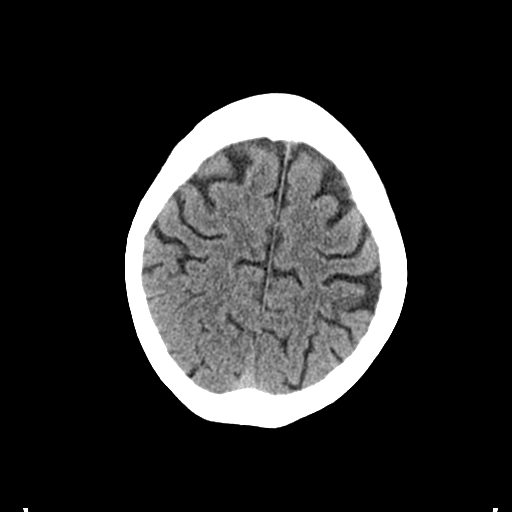
[im 26/33  brain]
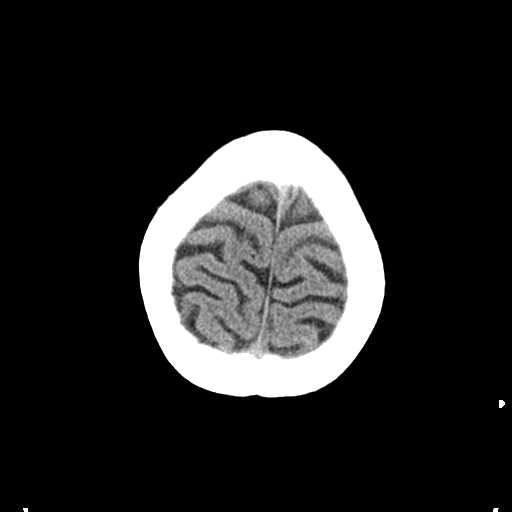
[im 30/33  brain]
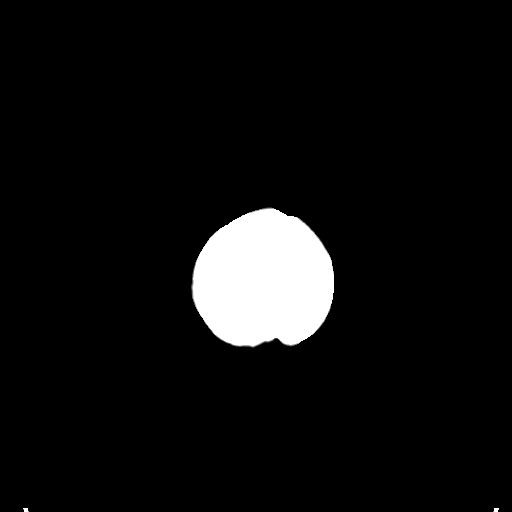

[Series 5: coronal soft tissue · coronal · 0.31mm/px · 3 of 80 slices shown]
[im 27/80  brain]
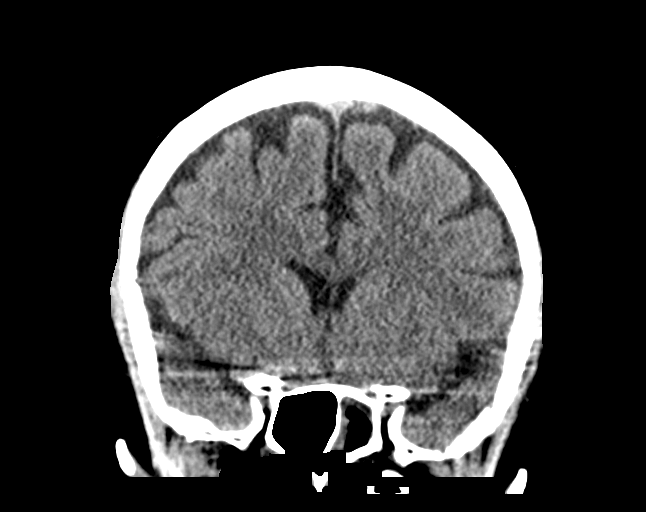
[im 36/80  brain]
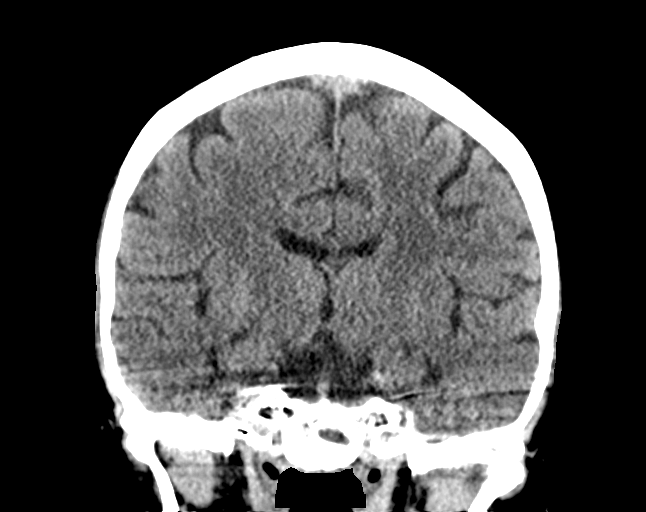
[im 44/80  brain]
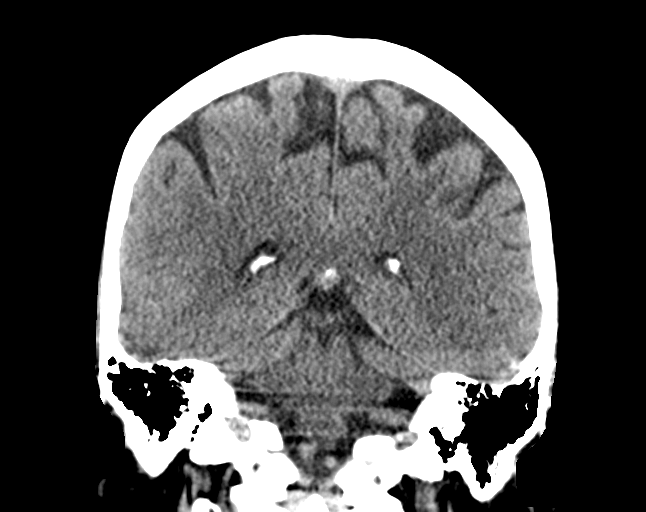

[Series 6: sagittal soft tissue · sagittal · 0.31mm/px · 3 of 59 slices shown]
[im 20/59  brain]
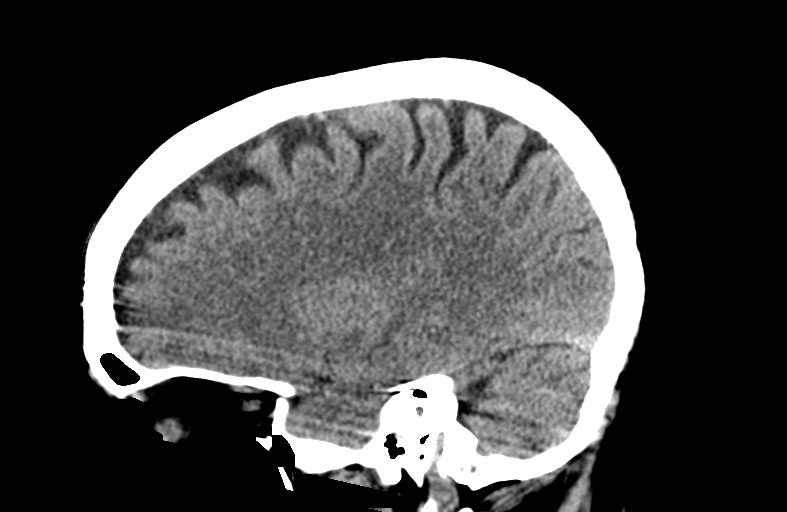
[im 30/59  brain]
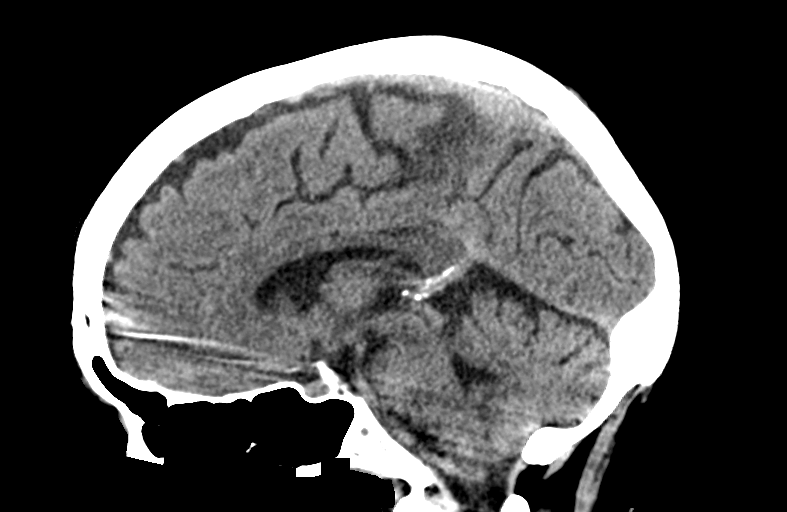
[im 39/59  brain]
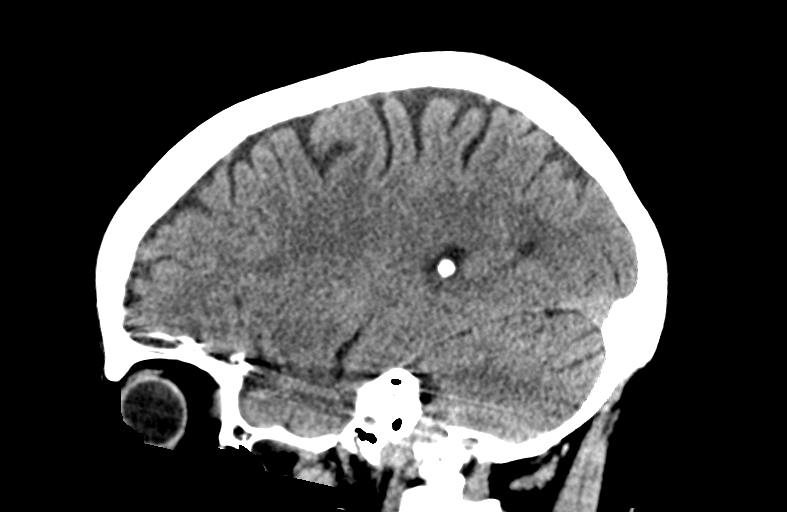

[14 of 47 positions shown; findings below may reference images not displayed]

FINDINGS: Brain: The ventricles and sulci appropriate size for patient's age.
The gray-white matter discrimination is preserved. There is no acute
intracranial hemorrhage. No mass effect or midline shift. No
extra-axial fluid collection.

Vascular: No hyperdense vessel or unexpected calcification.

Skull: Normal. Negative for fracture or focal lesion.

Sinuses/Orbits: There is mucoperiosteal thickening of the left
sphenoid sinus. No air-fluid levels. The remainder of the visualized
paranasal sinuses mastoid air cells are clear.

Other: Right frontotemporal scalp laceration.
IMPRESSION: No acute intracranial pathology.

## 2019-03-17 ENCOUNTER — Ambulatory Visit: Payer: BC Managed Care – PPO

## 2019-03-24 ENCOUNTER — Ambulatory Visit: Payer: BC Managed Care – PPO | Attending: Internal Medicine

## 2019-03-24 DIAGNOSIS — Z23 Encounter for immunization: Secondary | ICD-10-CM | POA: Insufficient documentation

## 2019-03-24 NOTE — Progress Notes (Signed)
   Covid-19 Vaccination Clinic  Name:  April Cox    MRN: [id number] DOB: 1955/12/12  03/24/2019  April Cox was observed post Covid-19 immunization for 30 minutes based on pre-vaccination screening without incident. She was provided with Vaccine Information Sheet and instruction to access the V-Safe system.   April Cox was instructed to call 911 with any severe reactions post vaccine: Marland Kitchen Difficulty breathing  . Swelling of face and throat  . A fast heartbeat  . A bad rash all over body  . Dizziness and weakness   Immunizations Administered    Name Date Dose VIS Date Route   Pfizer COVID-19 Vaccine 03/24/2019  9:30 AM 0.3 mL 12/30/2018 Intramuscular   Manufacturer: [place]   Lot: [id number]   [place]: [id number]

## 2019-04-04 ENCOUNTER — Other Ambulatory Visit: Payer: Self-pay | Admitting: Family Medicine

## 2019-04-04 DIAGNOSIS — I10 Essential (primary) hypertension: Secondary | ICD-10-CM

## 2019-04-05 ENCOUNTER — Other Ambulatory Visit: Payer: Self-pay | Admitting: Rheumatology

## 2019-04-05 DIAGNOSIS — L4050 Arthropathic psoriasis, unspecified: Secondary | ICD-10-CM

## 2019-04-05 NOTE — Telephone Encounter (Signed)
Last Visit: 12/21/18 Next Visit: 04/25/19 Labs: 01/06/20 CBC and CMP stable.  Tb Gold: 01/06/19 Neg   Okay to refill per Dr. Estanislado Pandy

## 2019-04-12 ENCOUNTER — Other Ambulatory Visit: Payer: Self-pay | Admitting: Family Medicine

## 2019-04-12 ENCOUNTER — Other Ambulatory Visit: Payer: Self-pay | Admitting: Rheumatology

## 2019-04-12 ENCOUNTER — Telehealth: Payer: Self-pay | Admitting: Rheumatology

## 2019-04-12 DIAGNOSIS — I10 Essential (primary) hypertension: Secondary | ICD-10-CM

## 2019-04-12 DIAGNOSIS — Z79899 Other long term (current) drug therapy: Secondary | ICD-10-CM

## 2019-04-12 DIAGNOSIS — E785 Hyperlipidemia, unspecified: Secondary | ICD-10-CM

## 2019-04-12 DIAGNOSIS — K219 Gastro-esophageal reflux disease without esophagitis: Secondary | ICD-10-CM

## 2019-04-12 NOTE — Telephone Encounter (Signed)
Patient left a voicemail stating she scheduled her follow-up appointment with Dr. Estanislado Pandy for 04/25/19 and was planning to have her labwork at that appointment.  Patient is requesting a return call.

## 2019-04-12 NOTE — Telephone Encounter (Signed)
Last Visit: 12/21/18 Next Visit: 04/25/19 Labs: 01/06/19 CBC and CMP stable.   Left message to advise patient she is due to update labs.   Okay to refill Folic Acid, syringes and 30 day supply MTX?

## 2019-04-12 NOTE — Telephone Encounter (Signed)
Ok to refill folic acid and syringes. Ok to refill 30 day supply of MTX

## 2019-04-12 NOTE — Telephone Encounter (Signed)
Patient advised we have sent a 30 day supply pf her MTX to the pharmacy as she is due to update labs. Patient advised her labs are due to be update every 3 months. Patient states she will be in this week to update her labs.

## 2019-04-14 ENCOUNTER — Other Ambulatory Visit: Payer: Self-pay

## 2019-04-14 DIAGNOSIS — Z79899 Other long term (current) drug therapy: Secondary | ICD-10-CM

## 2019-04-15 LAB — CBC WITH DIFFERENTIAL/PLATELET
Absolute Monocytes: 324 cells/uL (ref 200–950)
Basophils Absolute: 9 cells/uL (ref 0–200)
Basophils Relative: 0.1 %
Eosinophils Absolute: 36 cells/uL (ref 15–500)
Eosinophils Relative: 0.4 %
HCT: 39.7 % (ref 35.0–45.0)
Hemoglobin: 13.3 g/dL (ref 11.7–15.5)
Lymphs Abs: 2223 cells/uL (ref 850–3900)
MCH: 32.7 pg (ref 27.0–33.0)
MCHC: 33.5 g/dL (ref 32.0–36.0)
MCV: 97.5 fL (ref 80.0–100.0)
MPV: 10.5 fL (ref 7.5–12.5)
Monocytes Relative: 3.6 %
Neutro Abs: 6408 cells/uL (ref 1500–7800)
Neutrophils Relative %: 71.2 %
Platelets: 290 10*3/uL (ref 140–400)
RBC: 4.07 10*6/uL (ref 3.80–5.10)
RDW: 12.7 % (ref 11.0–15.0)
Total Lymphocyte: 24.7 %
WBC: 9 10*3/uL (ref 3.8–10.8)

## 2019-04-15 LAB — COMPLETE METABOLIC PANEL WITH GFR
AG Ratio: 1.6 (calc) (ref 1.0–2.5)
ALT: 16 U/L (ref 6–29)
AST: 21 U/L (ref 10–35)
Albumin: 4.5 g/dL (ref 3.6–5.1)
Alkaline phosphatase (APISO): 92 U/L (ref 37–153)
BUN/Creatinine Ratio: 8 (calc) (ref 6–22)
BUN: 4 mg/dL — ABNORMAL LOW (ref 7–25)
CO2: 26 mmol/L (ref 20–32)
Calcium: 10 mg/dL (ref 8.6–10.4)
Chloride: 103 mmol/L (ref 98–110)
Creat: 0.5 mg/dL (ref 0.50–0.99)
GFR, Est African American: 118 mL/min/{1.73_m2} (ref >=60)
GFR, Est Non African American: 102 mL/min/{1.73_m2} (ref >=60)
Globulin: 2.8 g/dL (calc) (ref 1.9–3.7)
Glucose, Bld: 86 mg/dL (ref 65–99)
Potassium: 4.1 mmol/L (ref 3.5–5.3)
Sodium: 139 mmol/L (ref 135–146)
Total Bilirubin: 0.5 mg/dL (ref 0.2–1.2)
Total Protein: 7.3 g/dL (ref 6.1–8.1)

## 2019-04-16 NOTE — Progress Notes (Signed)
WNLs

## 2019-04-20 NOTE — Progress Notes (Signed)
Office Visit Note  Patient: April Cox             Date of Birth: April 04, 1955           MRN: [id number]             PCP: Denita Lung, MD Referring: Denita Lung, MD Visit Date: 04/25/2019 Occupation: @GUAROCC @  Subjective:  Medication monitoring.   History of Present Illness: April Cox is a 64 y.o. female with history of psoriatic arthritis, psoriasis and degenerative disc disease.  She has been on Cosentyx 1 injection every 2 weeks.  She also takes methotrexate 1 mL subcu weekly  She denies any increased joint swelling or pain.  She believes her psoriatic arthritis is well controlled.  She gets occasional patches of psoriasis in her scalp.  She states her sicca symptoms are well controlled currently.  She recently saw her ophthalmologist.  She has been experiencing some diarrhea.  She is uncertain if it is related to some of the supplements she is taking.  She will schedule an appointment with her gastroenterologist.  She has been having some lower back discomfort which she relates to sitting on the desk for a long time.  Activities of Daily Living:  Patient reports morning stiffness for 20 minutes.   Patient Denies nocturnal pain.  Difficulty dressing/grooming: Denies  Difficulty climbing stairs: Denies Difficulty getting out of chair: Denies Difficulty using hands for taps, buttons, cutlery, and/or writing: Reports  Review of Systems  Constitutional: Positive for fatigue. Negative for night sweats, weight gain and weight loss.  HENT: Negative for mouth sores, trouble swallowing, trouble swallowing, mouth dryness and nose dryness.   Eyes: Positive for dryness. Negative for pain, redness and visual disturbance.  Respiratory: Negative for cough, shortness of breath and difficulty breathing.   Cardiovascular: Negative for chest pain, palpitations, hypertension, irregular heartbeat and swelling in legs/feet.  Gastrointestinal: Positive for diarrhea. Negative for blood in  stool and constipation.  Endocrine: Negative for increased urination.  Genitourinary: Negative for difficulty urinating and vaginal dryness.  Musculoskeletal: Positive for arthralgias, joint pain and morning stiffness. Negative for joint swelling, myalgias, muscle weakness, muscle tenderness and myalgias.  Skin: Positive for rash. Negative for color change, hair loss, skin tightness, ulcers and sensitivity to sunlight.       Mild psoriasis in scalp  Allergic/Immunologic: Negative for susceptible to infections.  Neurological: Positive for weakness. Negative for dizziness, numbness, memory loss and night sweats.  Hematological: Negative for bruising/bleeding tendency and swollen glands.  Psychiatric/Behavioral: Negative for depressed mood and sleep disturbance. The patient is not nervous/anxious.     PMFS History:  Patient Active Problem List   Diagnosis Date Noted  . Osteopenia 09/21/2018  . 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 DDD (degenerative disc disease), lumbar 04/01/2017  . Psoriasis 04/01/2017  . Skin ulcer of finger, limited to breakdown of skin ([place]) 01/01/2017  . Dry eye 01/01/2017  . Spondylosis of lumbar region without myelopathy or radiculopathy 01/07/2016  . High risk medication use 01/07/2016  . Essential hypertension 09/10/2014  . History of HPV infection 09/10/2014  . History of colonic polyps 09/10/2014  . Osteoporosis 09/10/2014  . Gastroesophageal reflux disease without esophagitis 09/10/2014  . Cigarette smoker 05/11/2011  . Hyperlipidemia 08/22/2010  . Hypothyroidism 08/22/2010  . Psoriatic arthritis ([place]) 08/22/2010    Past Medical History:  Diagnosis Date  . Abnormal Pap smear of cervix   . Anemia    history of anemia  . Arthritis   .  Diverticulosis of colon   . Family history of adverse reaction to anesthesia    mother PONV  . GERD (gastroesophageal reflux disease)   . Heart murmur    asymptomatic per pt  . History of adenomatous polyp of colon     11-06-2009  tubullar  . HPV in female   . Hyperlipidemia   . Hypertension   . Hypothyroidism   . Mild aortic valve stenosis    per echo 10-09-2010  ef 60-65%  valva area 1.48cm  . Psoriasis   . Psoriatic arthritis ([place])    Dr. Estanislado Pandy  . Wears glasses     Family History  Problem Relation Age of Onset  . Arthritis Mother        OA   . Psoriasis Mother   . Other Father        accidental death/MVA at age 54  . Hypertension Brother   . Other Brother        1 brother drowned  . Hypertension Maternal Grandmother   . Diabetes Maternal Grandmother   . Hypertension Maternal Grandfather   . Heart disease Neg Hx   . Cancer Neg Hx    Past Surgical History:  Procedure Laterality Date  . ABDOMINAL HYSTERECTOMY    . ANTERIOR LAT LUMBAR FUSION  12/25/2011   Procedure: ANTERIOR LATERAL LUMBAR FUSION 1 LEVEL;  Surgeon: Eustace Moore, MD;  Location: [facility];  Service: Neurosurgery;  Laterality: Left;  LUMBAR FOUR-FIVE  . BLADDER SUSPENSION N/A 12/19/2014   Procedure: TRANSVAGINAL TAPE (TVT) PROCEDURE;  Surgeon: Everett Graff, MD;  Location: [facility];  Service: Gynecology;  Laterality: N/A;  . CARPAL TUNNEL RELEASE Right April 2015  . CERVICAL CONIZATION W/BX N/A 08/28/2014   Procedure: CONIZATION CERVIX WITH BIOPSY;  Surgeon: Ena Dawley, MD;  Location: [facility];  Service: Gynecology;  Laterality: N/A;  . COLONOSCOPY  11-06-2009  . CYSTO N/A 12/19/2014   Procedure: CYSTO;  Surgeon: Everett Graff, MD;  Location: [facility];  Service: Gynecology;  Laterality: N/A;  . DENTAL SURGERY    . HYSTEROSCOPY WITH D & C  04-04-2008  . KNEE ARTHROPLASTY    . LUMBAR PERCUTANEOUS PEDICLE SCREW 1 LEVEL  12/25/2011   Procedure: LUMBAR PERCUTANEOUS PEDICLE SCREW 1 LEVEL;  Surgeon: Eustace Moore, MD;  Location: [facility];  Service: Neurosurgery;  Laterality: Left;  LUMBAR FOUR-FIVE  . PERONEAL NERVE DECOMPRESSION Left 12-27-2009   for neuropathy with  foot drop  . TRANSTHORACIC  ECHOCARDIOGRAM  10-09-2010   mild LVH,  ef 60-65%,  grade I diastolic dysfunction,  very mild AV stenosis (area 1.48cm/S/2(VTI) with no regurg./  mild MV calcification without stenosis or regur. /  trivial TR  . VAGINAL HYSTERECTOMY Bilateral 12/19/2014   Procedure: TOTAL VAGINAL HYSTERECTOMY WITH BILATERAL SALPINGECTOMY;  Surgeon: Ena Dawley, MD;  Location: [facility];  Service: Gynecology;  Laterality: Bilateral;   Social History   Social History Narrative  . Not on file   Immunization History  Administered Date(s) Administered  . Fluad Quad(high Dose 65+) 10/11/2018  . Influenza Split 12/19/2010, 12/01/2011  . Influenza,inj,Quad PF,6+ Mos 01/21/2016  . Influenza,inj,quad, With Preservative 12/04/2017  . PFIZER SARS-COV-2 Vaccination 02/27/2019, 03/24/2019  . Pneumococcal Conjugate-13 01/20/2014  . Pneumococcal Polysaccharide-23 08/01/2010  . Tdap 01/21/2016, 09/15/2017  . Zoster Recombinat (Shingrix) 12/11/2017, 02/27/2018     Objective: Vital Signs: BP 125/80 (BP Location: Left Arm, Patient Position: Sitting, Cuff Size: Normal)   Pulse 97   Resp 16  Ht 5' 5.5" (1.664 m)   Wt 115 lb (52.2 kg)   BMI 18.85 kg/m    Physical Exam Vitals and nursing note reviewed.  Constitutional:      Appearance: She is well-developed.  HENT:     Head: Normocephalic and atraumatic.  Eyes:     Conjunctiva/sclera: Conjunctivae normal.  Cardiovascular:     Rate and Rhythm: Normal rate and regular rhythm.     Heart sounds: Normal heart sounds.  Pulmonary:     Effort: Pulmonary effort is normal.     Breath sounds: Normal breath sounds.  Abdominal:     General: Bowel sounds are normal.     Palpations: Abdomen is soft.  Musculoskeletal:     Cervical back: Normal range of motion.  Lymphadenopathy:     Cervical: No cervical adenopathy.  Skin:    General: Skin is warm and dry.     Capillary Refill: Capillary refill takes less than 2 seconds.  Neurological:     Mental Status: She is  alert and oriented to person, place, and time.  Psychiatric:        Behavior: Behavior normal.      Musculoskeletal Exam: C-spine was in good range of motion.  Shoulder joints, elbow joints, wrist joints in good range of motion.  She has mild synovitis in her left wrist joint.  She has bilateral CMC PIP and DIP thickening.  She also has synovial thickening in her PIP and DIP joints with no synovitis.  Hip joints, knee joints, ankles with good range of motion.  She has no tenderness over MTPs or PIPs.  CDAI Exam: CDAI Score: 2.4  Patient Global: 2 mm; Provider Global: 2 mm Swollen: 1 ; Tender: 1  Joint Exam 04/25/2019      Right  Left  Wrist     Swollen Tender     Investigation: No additional findings.  Imaging: No results found.  Recent Labs: Lab Results  Component Value Date   WBC 9.0 04/14/2019   HGB 13.3 04/14/2019   PLT 290 04/14/2019   NA 139 04/14/2019   K 4.1 04/14/2019   CL 103 04/14/2019   CO2 26 04/14/2019   GLUCOSE 86 04/14/2019   BUN 4 (L) 04/14/2019   CREATININE 0.50 04/14/2019   BILITOT 0.5 04/14/2019   ALKPHOS 83 08/30/2017   AST 21 04/14/2019   ALT 16 04/14/2019   PROT 7.3 04/14/2019   ALBUMIN 4.5 08/30/2017   CALCIUM 10.0 04/14/2019   GFRAA 118 04/14/2019   QFTBGOLD Negative 08/02/2015   QFTBGOLDPLUS NEGATIVE 01/06/2019    Speciality Comments: Prior therapy: Remicade and Humira (inadequate response) Osteoporosis managed by PCP. Last DXA August 2019.  Procedures:  No procedures performed Allergies: Codeine, Other, Penicillins, and Gluten meal   Assessment / Plan:     Visit Diagnoses: Psoriatic arthritis ([place]) -patient is clinically doing well on Cosentyx and methotrexate combination.  She has mild synovitis in her left wrist joint.  Overall her symptoms have improved remarkably.  She had inadequate response to Remicade in the past.  Psoriasis-according to patient she has few scattered patches of psoriasis in her scalp I could not see them  today.  High risk medication use - Cosentyx 150 mg every 14 days (started September 2019), methotrexate 1 mL every 7 days, and folic acid 1 mg 2 tablets daily.  Labs in March were within normal limits.  She will have labs in June and then every 3 months to monitor for drug toxicity.  TB  Gold in December 2020 was normal.  Primary osteoarthritis of both hands-she has bilateral CMC discomfort.  Joint protection was discussed.  Trochanteric bursitis, left hip-has improved with exercises.  Age-related osteoporosis without current pathological fracture - Tymlos 80 mcg daily prescribed by Dr. 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  Her eye symptoms are under control.  DDD (degenerative disc disease), lumbar-she continues to have some lower back pain.  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  History of hypertension-her blood pressure is controlled.  History of hypothyroidism  History of gastroesophageal reflux (GERD)  History of colonic polyps-she has been experiencing some diarrhea.  She wonders if it is related to her supplements.  I have advised her to follow-up with her GI if symptoms persist.  Smoker-occasional  Orders: No orders of the defined types were placed in this encounter.  No orders of the defined types were placed in this encounter.    Follow-Up Instructions: Return in about 5 months (around 09/25/2019) for Psoriatic arthritis, Osteoarthritis.   Bo Merino, MD  Note - This record has been created using Editor, commissioning.  Chart creation errors have been sought, but may not always  have been located. Such creation errors do not reflect on  the standard of medical care.

## 2019-04-25 ENCOUNTER — Ambulatory Visit (INDEPENDENT_AMBULATORY_CARE_PROVIDER_SITE_OTHER): Payer: BC Managed Care – PPO | Admitting: Rheumatology

## 2019-04-25 ENCOUNTER — Other Ambulatory Visit: Payer: Self-pay

## 2019-04-25 ENCOUNTER — Encounter: Payer: Self-pay | Admitting: Physician Assistant

## 2019-04-25 VITALS — BP 125/80 | HR 97 | Resp 16 | Ht 65.5 in | Wt 115.0 lb

## 2019-04-25 DIAGNOSIS — M19041 Primary osteoarthritis, right hand: Secondary | ICD-10-CM | POA: Diagnosis not present

## 2019-04-25 DIAGNOSIS — L4050 Arthropathic psoriasis, unspecified: Secondary | ICD-10-CM | POA: Diagnosis not present

## 2019-04-25 DIAGNOSIS — L409 Psoriasis, unspecified: Secondary | ICD-10-CM

## 2019-04-25 DIAGNOSIS — M7062 Trochanteric bursitis, left hip: Secondary | ICD-10-CM

## 2019-04-25 DIAGNOSIS — M5136 Other intervertebral disc degeneration, lumbar region: Secondary | ICD-10-CM

## 2019-04-25 DIAGNOSIS — M3500 Sicca syndrome, unspecified: Secondary | ICD-10-CM

## 2019-04-25 DIAGNOSIS — Z8679 Personal history of other diseases of the circulatory system: Secondary | ICD-10-CM

## 2019-04-25 DIAGNOSIS — M810 Age-related osteoporosis without current pathological fracture: Secondary | ICD-10-CM

## 2019-04-25 DIAGNOSIS — Z8639 Personal history of other endocrine, nutritional and metabolic disease: Secondary | ICD-10-CM

## 2019-04-25 DIAGNOSIS — Z86010 Personal history of colonic polyps: Secondary | ICD-10-CM

## 2019-04-25 DIAGNOSIS — Z79899 Other long term (current) drug therapy: Secondary | ICD-10-CM

## 2019-04-25 DIAGNOSIS — M19042 Primary osteoarthritis, left hand: Secondary | ICD-10-CM

## 2019-04-25 DIAGNOSIS — Z8719 Personal history of other diseases of the digestive system: Secondary | ICD-10-CM

## 2019-04-25 DIAGNOSIS — F17200 Nicotine dependence, unspecified, uncomplicated: Secondary | ICD-10-CM

## 2019-04-25 NOTE — Patient Instructions (Signed)
Standing Labs We placed an order today for your standing lab work.    Please come back and get your standing labs in June and every  months  We have open lab daily Monday through Thursday from 8:30-12:30 PM and 1:30-4:30 PM and Friday from 8:30-12:30 PM and 1:30-4:00 PM at the office of Dr. Bo Merino.   You may experience shorter wait times on Monday and Friday afternoons. The office is located at [address] [place], [place], [place] [address] No appointment is necessary.   Labs are drawn by [facility].  You may receive a bill from [facility] for your lab work.  If you wish to have your labs drawn at another location, please call the office 24 hours in advance to send orders.  If you have any questions regarding directions or hours of operation,  please call [phone].   Just as a reminder please drink plenty of water prior to coming for your lab work. Thanks!

## 2019-05-03 ENCOUNTER — Other Ambulatory Visit: Payer: Self-pay | Admitting: Rheumatology

## 2019-05-03 DIAGNOSIS — L4050 Arthropathic psoriasis, unspecified: Secondary | ICD-10-CM

## 2019-05-03 NOTE — Telephone Encounter (Signed)
Last Visit: 04/25/2019 Next Visit: 09/27/2019 Labs: 04/14/2019 WNLs TB Gold: 01/06/2019 negative   Okay to refill per Dr. Estanislado Pandy.

## 2019-05-12 ENCOUNTER — Other Ambulatory Visit: Payer: Self-pay | Admitting: Family Medicine

## 2019-05-12 DIAGNOSIS — E785 Hyperlipidemia, unspecified: Secondary | ICD-10-CM

## 2019-05-12 NOTE — Telephone Encounter (Signed)
Has an appt in June 2021

## 2019-06-28 ENCOUNTER — Encounter: Payer: Self-pay | Admitting: Family Medicine

## 2019-06-28 ENCOUNTER — Other Ambulatory Visit: Payer: Self-pay

## 2019-06-28 ENCOUNTER — Ambulatory Visit (INDEPENDENT_AMBULATORY_CARE_PROVIDER_SITE_OTHER): Payer: BC Managed Care – PPO | Admitting: Family Medicine

## 2019-06-28 ENCOUNTER — Encounter: Payer: Self-pay | Admitting: Gastroenterology

## 2019-06-28 VITALS — BP 112/70 | HR 74 | Temp 97.7°F | Ht 65.0 in | Wt 115.6 lb

## 2019-06-28 DIAGNOSIS — Z860100 Personal history of colon polyps, unspecified: Secondary | ICD-10-CM

## 2019-06-28 DIAGNOSIS — M810 Age-related osteoporosis without current pathological fracture: Secondary | ICD-10-CM | POA: Diagnosis not present

## 2019-06-28 DIAGNOSIS — Z79899 Other long term (current) drug therapy: Secondary | ICD-10-CM

## 2019-06-28 DIAGNOSIS — E785 Hyperlipidemia, unspecified: Secondary | ICD-10-CM | POA: Diagnosis not present

## 2019-06-28 DIAGNOSIS — L409 Psoriasis, unspecified: Secondary | ICD-10-CM

## 2019-06-28 DIAGNOSIS — M3500 Sicca syndrome, unspecified: Secondary | ICD-10-CM

## 2019-06-28 DIAGNOSIS — K219 Gastro-esophageal reflux disease without esophagitis: Secondary | ICD-10-CM

## 2019-06-28 DIAGNOSIS — I10 Essential (primary) hypertension: Secondary | ICD-10-CM

## 2019-06-28 DIAGNOSIS — Z0000 Encounter for general adult medical examination without abnormal findings: Secondary | ICD-10-CM

## 2019-06-28 DIAGNOSIS — L4050 Arthropathic psoriasis, unspecified: Secondary | ICD-10-CM

## 2019-06-28 DIAGNOSIS — E038 Other specified hypothyroidism: Secondary | ICD-10-CM | POA: Diagnosis not present

## 2019-06-28 DIAGNOSIS — M51369 Other intervertebral disc degeneration, lumbar region without mention of lumbar back pain or lower extremity pain: Secondary | ICD-10-CM

## 2019-06-28 DIAGNOSIS — F17210 Nicotine dependence, cigarettes, uncomplicated: Secondary | ICD-10-CM

## 2019-06-28 DIAGNOSIS — M5136 Other intervertebral disc degeneration, lumbar region: Secondary | ICD-10-CM | POA: Diagnosis not present

## 2019-06-28 DIAGNOSIS — I350 Nonrheumatic aortic (valve) stenosis: Secondary | ICD-10-CM

## 2019-06-28 NOTE — Progress Notes (Signed)
   Subjective:    Patient ID: April Cox, female    DOB: Jun 28, 1955, 64 y.o.   MRN: [id number]  HPI She is here for complete examination.  She does have a history of psoriasis and psoriatic arthritis and is now on medication through her rheumatologist for this and is getting regular blood work.  She also has osteoporosis and is presently taking Tymlos.  She continues on her thyroid medication but there is a question as to which strength she is getting.  She also continues on atorvastatin and is having no aches or pains with that.  She does have a history of colonic polyps.  Review of the record shows that she did have adenomatous polyp but has not been seen in quite some time.  She was told that she can wait 10 years.  She does have reflux disease and has been using Protonix.  She does have a history of degenerative disc disease as well as x-ray as well as MRI documentation of difficulty.  She does continue to smoke but has cut down to a 4 to 5 cigarettes/day.  She will continue to work on this and does plan on quitting after a trip to the beach.  Otherwise she has no particular concerns or complaints.  Review of her record does show adenomatous polyps from 2011 and also an echocardiogram that shows aortic stenosis.  Family and social history as well as health maintenance immunizations was otherwise reviewed.  She will follow-up with gynecology 1 more time.   Review of Systems  All other systems reviewed and are negative.      Objective:   Physical Exam Alert and in no distress. Tympanic membranes and canals are normal. Pharyngeal area is normal. Neck is supple without adenopathy or thyromegaly. Cardiac exam shows a regular sinus rhythm with a 2/6 SEM heard best along the right sternal border, no gallops. Lungs are clear to auscultation.        Assessment & Plan:  Routine general medical examination at a health care facility  Osteoporosis, unspecified osteoporosis type, unspecified  pathological fracture presence  DDD (degenerative disc disease), lumbar  Psoriasis  High risk medication use  History of colonic polyps - Plan: Ambulatory referral to Gastroenterology  Aortic valve stenosis, etiology of cardiac valve disease unspecified - Plan: ECHOCARDIOGRAM COMPLETE  Hyperlipidemia, unspecified hyperlipidemia type - Plan: Lipid panel  Other specified hypothyroidism - Plan: TSH  Sicca complex (HCC)  Psoriatic arthritis ([place])  Gastroesophageal reflux disease without esophagitis  Essential hypertension  Cigarette smoker  Encouraged her to continue with her smoking cessation.  She will continue on her present medication regimen.  Did recommend that she stop taking the Protonix and try to switch to an H2 blocker.  She will keep me informed concerning that.  We will follow up on her thyroid based on the studies.

## 2019-06-28 NOTE — Patient Instructions (Signed)
Try Pepcid or Zantac for control of the reflux symptoms and again do it on an as-needed basis and see if you can stop the proton

## 2019-06-29 ENCOUNTER — Encounter: Payer: BC Managed Care – PPO | Admitting: Family Medicine

## 2019-06-29 LAB — LIPID PANEL
Chol/HDL Ratio: 5.1 ratio — ABNORMAL HIGH (ref 0.0–4.4)
Cholesterol, Total: 219 mg/dL — ABNORMAL HIGH (ref 100–199)
HDL: 43 mg/dL (ref >39)
LDL Chol Calc (NIH): 159 mg/dL — ABNORMAL HIGH (ref 0–99)
Triglycerides: 96 mg/dL (ref 0–149)
VLDL Cholesterol Cal: 17 mg/dL (ref 5–40)

## 2019-06-29 LAB — TSH: TSH: 3.01 u[IU]/mL (ref 0.450–4.500)

## 2019-07-05 ENCOUNTER — Other Ambulatory Visit: Payer: Self-pay | Admitting: Family Medicine

## 2019-07-05 DIAGNOSIS — I10 Essential (primary) hypertension: Secondary | ICD-10-CM

## 2019-07-11 ENCOUNTER — Other Ambulatory Visit: Payer: Self-pay | Admitting: Family Medicine

## 2019-07-11 DIAGNOSIS — K219 Gastro-esophageal reflux disease without esophagitis: Secondary | ICD-10-CM

## 2019-07-13 ENCOUNTER — Other Ambulatory Visit: Payer: Self-pay | Admitting: Family Medicine

## 2019-07-13 ENCOUNTER — Ambulatory Visit (HOSPITAL_COMMUNITY): Payer: BC Managed Care – PPO

## 2019-07-13 DIAGNOSIS — E785 Hyperlipidemia, unspecified: Secondary | ICD-10-CM

## 2019-07-19 ENCOUNTER — Other Ambulatory Visit: Payer: Self-pay

## 2019-07-19 ENCOUNTER — Ambulatory Visit (HOSPITAL_COMMUNITY)
Admission: RE | Admit: 2019-07-19 | Discharge: 2019-07-19 | Disposition: A | Discharge status: Home / Self Care | Payer: BC Managed Care – PPO | Source: Ambulatory Visit | Attending: Family Medicine | Admitting: Family Medicine

## 2019-07-19 DIAGNOSIS — F17200 Nicotine dependence, unspecified, uncomplicated: Secondary | ICD-10-CM | POA: Insufficient documentation

## 2019-07-19 DIAGNOSIS — R011 Cardiac murmur, unspecified: Secondary | ICD-10-CM | POA: Diagnosis not present

## 2019-07-19 DIAGNOSIS — I350 Nonrheumatic aortic (valve) stenosis: Secondary | ICD-10-CM

## 2019-07-19 DIAGNOSIS — E785 Hyperlipidemia, unspecified: Secondary | ICD-10-CM | POA: Insufficient documentation

## 2019-07-19 DIAGNOSIS — I10 Essential (primary) hypertension: Secondary | ICD-10-CM | POA: Diagnosis not present

## 2019-07-19 NOTE — Progress Notes (Signed)
  Echocardiogram 2D Echocardiogram has been performed.  Jannett Celestine 07/19/2019, 9:43 AM

## 2019-08-03 ENCOUNTER — Ambulatory Visit (AMBULATORY_SURGERY_CENTER): Payer: Self-pay | Admitting: *Deleted

## 2019-08-03 ENCOUNTER — Other Ambulatory Visit: Payer: Self-pay

## 2019-08-03 VITALS — Ht 65.0 in | Wt 118.0 lb

## 2019-08-03 DIAGNOSIS — Z86010 Personal history of colonic polyps: Secondary | ICD-10-CM

## 2019-08-03 MED ORDER — SUTAB 1479-225-188 MG PO TABS: 1.0000 | ORAL_TABLET | Freq: Once | ORAL | 0 refills | Status: AC

## 2019-08-03 NOTE — Progress Notes (Signed)
No egg or soy allergy known to patient  No issues with past sedation with any surgeries or procedures no intubation problems in the past  No diet pills per patient No home 02 use per patient  No blood thinners per patient  Pt denies issues with constipation  No A fib or A flutter  EMMI video to pt or MyChart  COVID 19 guidelines implemented in [facility] today   [employer] Coupon given to pt in [facility] today   Due to the COVID-19 pandemic we are asking patients to follow these guidelines. Please only bring one care partner. Please be aware that your care partner may wait in the car in the parking lot or if they feel like they will be too hot to wait in the car, they may wait in the lobby on the 4th floor. All care partners are required to wear a mask the entire time (we do not have any that we can provide them), they need to practice social distancing, and we will do a Covid check for all patient's and care partners when you arrive. Also we will check their temperature and your temperature. If the care partner waits in their car they need to stay in the parking lot the entire time and we will call them on their cell phone when the patient is ready for discharge so they can bring the car to the front of the building. Also all patient's will need to wear a mask into building.

## 2019-08-20 HISTORY — PX: COLONOSCOPY: SHX174

## 2019-08-21 ENCOUNTER — Encounter: Payer: Self-pay | Admitting: Gastroenterology

## 2019-08-21 ENCOUNTER — Ambulatory Visit (AMBULATORY_SURGERY_CENTER): Payer: BC Managed Care – PPO | Admitting: Gastroenterology

## 2019-08-21 ENCOUNTER — Other Ambulatory Visit: Payer: Self-pay

## 2019-08-21 VITALS — BP 135/73 | HR 72 | Temp 97.1°F | Resp 18 | Ht 65.0 in | Wt 118.0 lb

## 2019-08-21 DIAGNOSIS — Z86010 Personal history of colonic polyps: Secondary | ICD-10-CM | POA: Diagnosis not present

## 2019-08-21 DIAGNOSIS — D125 Benign neoplasm of sigmoid colon: Secondary | ICD-10-CM | POA: Diagnosis not present

## 2019-08-21 DIAGNOSIS — Z1211 Encounter for screening for malignant neoplasm of colon: Secondary | ICD-10-CM | POA: Diagnosis not present

## 2019-08-21 DIAGNOSIS — D127 Benign neoplasm of rectosigmoid junction: Secondary | ICD-10-CM | POA: Diagnosis not present

## 2019-08-21 MED ORDER — SODIUM CHLORIDE 0.9 % IV SOLN: 500.0000 mL | INTRAVENOUS | Status: DC

## 2019-08-21 NOTE — Patient Instructions (Signed)
Please read handouts provided. Continue present medications. Await pathology results. No ibuprofen, naproxen, or other non-steriodal anti-inflammatory drugs for 2 weeks.   YOU HAD AN ENDOSCOPIC PROCEDURE TODAY AT THE [facility]:   Refer to the procedure report that was given to you for any specific questions about what was found during the examination.  If the procedure report does not answer your questions, please call your gastroenterologist to clarify.  If you requested that your care partner not be given the details of your procedure findings, then the procedure report has been included in a sealed envelope for you to review at your convenience later.  YOU SHOULD EXPECT: Some feelings of bloating in the abdomen. Passage of more gas than usual.  Walking can help get rid of the air that was put into your GI tract during the procedure and reduce the bloating. If you had a lower endoscopy (such as a colonoscopy or flexible sigmoidoscopy) you may notice spotting of blood in your stool or on the toilet paper. If you underwent a bowel prep for your procedure, you may not have a normal bowel movement for a few days.  Please Note:  You might notice some irritation and congestion in your nose or some drainage.  This is from the oxygen used during your procedure.  There is no need for concern and it should clear up in a day or so.  SYMPTOMS TO REPORT IMMEDIATELY:  Following lower endoscopy (colonoscopy or flexible sigmoidoscopy):  Excessive amounts of blood in the stool  Significant tenderness or worsening of abdominal pains  Swelling of the abdomen that is new, acute  Fever of 100F or higher   For urgent or emergent issues, a gastroenterologist can be reached at any hour by calling [phone]. Do not use MyChart messaging for urgent concerns.    DIET:  We do recommend a small meal at first, but then you may proceed to your regular diet.  Drink plenty of fluids but you should  avoid alcoholic beverages for 24 hours.  ACTIVITY:  You should plan to take it easy for the rest of today and you should NOT DRIVE or use heavy machinery until tomorrow (because of the sedation medicines used during the test).    FOLLOW UP: Our staff will call the number listed on your records 48-72 hours following your procedure to check on you and address any questions or concerns that you may have regarding the information given to you following your procedure. If we do not reach you, we will leave a message.  We will attempt to reach you two times.  During this call, we will ask if you have developed any symptoms of COVID 19. If you develop any symptoms (ie: fever, flu-like symptoms, shortness of breath, cough etc.) before then, please call [phone].  If you test positive for Covid 19 in the 2 weeks post procedure, please call and report this information to us.    If any biopsies were taken you will be contacted by phone or by letter within the next 1-3 weeks.  Please call us at [phone] if you have not heard about the biopsies in 3 weeks.    SIGNATURES/CONFIDENTIALITY: You and/or your care partner have signed paperwork which will be entered into your electronic medical record.  These signatures attest to the fact that that the information above on your After Visit Summary has been reviewed and is understood.  Full responsibility of the confidentiality of this discharge information lies with   this discharge information lies with you and/or your care-partner.

## 2019-08-21 NOTE — Op Note (Addendum)
[name] Procedure Date: 08/21/2019 9:28 AM MRN: [id number] Endoscopist: Remo Lipps P. Havery Moros , MD Age: 64 Referring MD:  Date of Birth: Mar 02, 1955 Gender: Female Account #: [id number] Procedure:                Colonoscopy Indications:              High risk colon cancer surveillance: Personal                            history of colonic polyps (adenomas removed in                            2011), first surveillance exam since that time Medicines:                Monitored Anesthesia Care Procedure:                Pre-Anesthesia Assessment:                           - Prior to the procedure, a History and Physical                            was performed, and patient medications and                            allergies were reviewed. The patient's tolerance of                            previous anesthesia was also reviewed. The risks                            and benefits of the procedure and the sedation                            options and risks were discussed with the patient.                            All questions were answered, and informed consent                            was obtained. Prior Anticoagulants: The patient has                            taken no previous anticoagulant or antiplatelet                            agents. ASA Grade Assessment: II - A patient with                            mild systemic disease. After reviewing the risks                            and benefits, the patient was deemed in  satisfactory condition to undergo the procedure.                           After obtaining informed consent, the colonoscope                            was passed under direct vision. Throughout the                            procedure, the patient's blood pressure, pulse, and                            oxygen saturations were monitored continuously. The                            Colonoscope was  introduced through the anus and                            advanced to the the cecum, identified by                            appendiceal orifice and ileocecal valve. The                            colonoscopy was performed without difficulty. The                            patient tolerated the procedure well. The quality                            of the bowel preparation was good. The ileocecal                            valve, appendiceal orifice, and rectum were                            photographed. Scope In: 9:32:41 AM Scope Out: 9:53:05 AM Scope Withdrawal Time: 0 hours 16 minutes 50 seconds  Total Procedure Duration: 0 hours 20 minutes 24 seconds  Findings:                 Hemorrhoids were found on perianal exam.                           A 10 mm polyp was found in the sigmoid colon. The                            polyp was pedunculated. The polyp was removed with                            a hot snare. Resection and retrieval were complete.                           A 4 mm polyp was found in the recto-sigmoid colon.  The polyp was sessile. The polyp was removed with a                            cold snare. Resection and retrieval were complete.                           Multiple small-mouthed diverticula were found in                            the sigmoid colon and ascending colon.                           Internal hemorrhoids were found during                            retroflexion. The hemorrhoids were large.                           The exam was otherwise without abnormality. Complications:            No immediate complications. Estimated blood loss:                            Minimal. Estimated Blood Loss:     Estimated blood loss was minimal. Impression:               - Hemorrhoids found on perianal exam.                           - One 10 mm polyp in the sigmoid colon, removed                            with a hot snare. Resected and  retrieved.                           - One 4 mm polyp at the recto-sigmoid colon,                            removed with a cold snare. Resected and retrieved.                           - Diverticulosis in the sigmoid colon and ascending                            colon.                           - Internal hemorrhoids.                           - The examination was otherwise normal. Recommendation:           - Patient has a contact number available for                            emergencies. The signs and symptoms of potential  delayed complications were discussed with the                            patient. Return to normal activities tomorrow.                            Written discharge instructions were provided to the                            patient.                           - Resume previous diet.                           - Continue present medications.                           - Await pathology results.                           - No ibuprofen, naproxen, or other non-steroidal                            anti-inflammatory drugs for 2 weeks after polyp                            removal. Remo Lipps P. [name], MD 08/21/2019 9:57:44 AM This report has been signed electronically.

## 2019-08-21 NOTE — Progress Notes (Signed)
Vs WR I have reviewed the patient's medical history in detail and updated the computerized patient record.

## 2019-08-21 NOTE — Progress Notes (Signed)
A and O x3. Report to RN. Tolerated MAC anesthesia well.

## 2019-08-21 NOTE — Progress Notes (Signed)
Called to room to assist during endoscopic procedure.  Patient ID and intended procedure confirmed with present staff. Received instructions for my participation in the procedure from the performing physician.  

## 2019-08-23 ENCOUNTER — Telehealth: Payer: Self-pay

## 2019-08-23 NOTE — Telephone Encounter (Signed)
Left message on follow up call. 

## 2019-08-29 ENCOUNTER — Other Ambulatory Visit: Payer: Self-pay | Admitting: Physician Assistant

## 2019-08-29 NOTE — Telephone Encounter (Signed)
Last Visit: 04/25/2019 Next Visit: 09/27/2019 Labs: 04/14/2019 WNLs  Current Dose per office note on 04/25/2019: methotrexate 1 mL every 7 days DX: Psoriatic arthritis   Left message to advise patient she is due to update labs.   Okay to refill 30 day supply MTX?

## 2019-09-13 NOTE — Progress Notes (Signed)
Office Visit Note  Patient: April Cox             Date of Birth: 09-29-1955           MRN: [id number]             PCP: Denita Lung, MD Referring: Denita Lung, MD Visit Date: 09/27/2019 Occupation: @GUAROCC @  Subjective:  Medication monitoring   History of Present Illness: April Cox is a 64 y.o. female with history of psoriatic arthritis, osteoarthritis, and DDD.  Patient is on Cosentyx 150 mg at least injections every 14 days, methotrexate 1.0 mL subcu injections once weekly, folic acid 2 mg by mouth daily.  Patient has been under tremendous amount of stress for the past 6 months and continues to have recurrent flares.  She denies any joint pain or joint swelling today.  She experiences intermittent discomfort in both hands and her left knee joint.  She continues to have chronic lower back pain.  She denies any Achilles tendinitis.  She has occasional plantar fasciitis which is resolved by rolling the plantar fascia. She is planning on retiring in December and moving to [facility] to be closer to family.   She denies any recent infections.  She has received both covid-19 vaccinations and is planning on receiving the 3rd dose.    Activities of Daily Living:  Patient reports morning stiffness for 15-60  minutes.   Patient Reports nocturnal pain.  Difficulty dressing/grooming: Denies Difficulty climbing stairs: Denies Difficulty getting out of chair: Denies Difficulty using hands for taps, buttons, cutlery, and/or writing: Reports  Review of Systems  Constitutional: Positive for fatigue.  HENT: Positive for mouth dryness and nose dryness. Negative for mouth sores.   Eyes: Positive for dryness.  Respiratory: Negative for shortness of breath and difficulty breathing.   Cardiovascular: Negative for chest pain and palpitations.  Gastrointestinal: Positive for diarrhea. Negative for blood in stool and constipation.  Endocrine: Negative for increased urination.    Genitourinary: Negative for difficulty urinating.  Musculoskeletal: Positive for arthralgias, joint pain, joint swelling and morning stiffness. Negative for myalgias, muscle tenderness and myalgias.  Skin: Negative for color change, rash and redness.  Allergic/Immunologic: Negative for susceptible to infections.  Neurological: Negative for dizziness, numbness, headaches, memory loss and weakness.  Hematological: Positive for bruising/bleeding tendency.  Psychiatric/Behavioral: Negative for confusion.    PMFS History:  Patient Active Problem List   Diagnosis Date Noted  . Sicca complex ([place]) 04/01/2017  . DDD (degenerative disc disease), lumbar 04/01/2017  . Psoriasis 04/01/2017  . Dry eye 01/01/2017  . Spondylosis of lumbar region without myelopathy or radiculopathy 01/07/2016  . High risk medication use 01/07/2016  . Essential hypertension 09/10/2014  . History of HPV infection 09/10/2014  . History of colonic polyps 09/10/2014  . Osteoporosis 09/10/2014  . Gastroesophageal reflux disease without esophagitis 09/10/2014  . Cigarette smoker 05/11/2011  . Hyperlipidemia 08/22/2010  . Hypothyroidism 08/22/2010  . Psoriatic arthritis ([place]) 08/22/2010    Past Medical History:  Diagnosis Date  . Abnormal Pap smear of cervix   . Anemia    history of anemia  . Arthritis   . Cataract   . Diverticulosis of colon   . Family history of adverse reaction to anesthesia    mother PONV  . GERD (gastroesophageal reflux disease)   . Heart murmur    asymptomatic per pt  . History of adenomatous polyp of colon    11-06-2009  tubullar  . HPV in  female   . Hyperlipidemia   . Hypertension   . Hypothyroidism   . Mild aortic valve stenosis    per echo 10-09-2010  ef 60-65%  valva area 1.48cm  . Osteoporosis   . Psoriasis   . Psoriatic arthritis ([place])    Dr. Estanislado Pandy  . Wears glasses     Family History  Problem Relation Age of Onset  . Arthritis Mother        OA   . Psoriasis  Mother   . Other Father        accidental death/MVA at age 38  . Hypertension Brother   . Other Brother        1 brother drowned  . Hypertension Maternal Grandmother   . Diabetes Maternal Grandmother   . Hypertension Maternal Grandfather   . Heart disease Neg Hx   . Cancer Neg Hx   . Colon cancer Neg Hx   . Colon polyps Neg Hx   . Stomach cancer Neg Hx   . Esophageal cancer Neg Hx    Past Surgical History:  Procedure Laterality Date  . ABDOMINAL HYSTERECTOMY    . ANTERIOR LAT LUMBAR FUSION  12/25/2011   Procedure: ANTERIOR LATERAL LUMBAR FUSION 1 LEVEL;  Surgeon: Eustace Moore, MD;  Location: [facility];  Service: Neurosurgery;  Laterality: Left;  LUMBAR FOUR-FIVE  . BLADDER SUSPENSION N/A 12/19/2014   Procedure: TRANSVAGINAL TAPE (TVT) PROCEDURE;  Surgeon: Everett Graff, MD;  Location: [facility];  Service: Gynecology;  Laterality: N/A;  . CARPAL TUNNEL RELEASE Right April 2015  . CERVICAL CONIZATION W/BX N/A 08/28/2014   Procedure: CONIZATION CERVIX WITH BIOPSY;  Surgeon: Ena Dawley, MD;  Location: [facility];  Service: Gynecology;  Laterality: N/A;  . COLONOSCOPY  11-06-2009  . COLONOSCOPY  08/2019  . CYSTO N/A 12/19/2014   Procedure: CYSTO;  Surgeon: Everett Graff, MD;  Location: [facility];  Service: Gynecology;  Laterality: N/A;  . DENTAL SURGERY    . HYSTEROSCOPY WITH D & C  04-04-2008  . KNEE ARTHROPLASTY    . LUMBAR PERCUTANEOUS PEDICLE SCREW 1 LEVEL  12/25/2011   Procedure: LUMBAR PERCUTANEOUS PEDICLE SCREW 1 LEVEL;  Surgeon: Eustace Moore, MD;  Location: [facility];  Service: Neurosurgery;  Laterality: Left;  LUMBAR FOUR-FIVE  . PERONEAL NERVE DECOMPRESSION Left 12-27-2009   for neuropathy with  foot drop  . POLYPECTOMY    . TRANSTHORACIC ECHOCARDIOGRAM  10-09-2010   mild LVH,  ef 60-65%,  grade I diastolic dysfunction,  very mild AV stenosis (area 1.48cm/S/2(VTI) with no regurg./  mild MV calcification without stenosis or regur. /  trivial TR  .  VAGINAL HYSTERECTOMY Bilateral 12/19/2014   Procedure: TOTAL VAGINAL HYSTERECTOMY WITH BILATERAL SALPINGECTOMY;  Surgeon: Ena Dawley, MD;  Location: [facility];  Service: Gynecology;  Laterality: Bilateral;   Social History   Social History Narrative  . Not on file   Immunization History  Administered Date(s) Administered  . Fluad Quad(high Dose 65+) 10/11/2018  . Influenza Split 12/19/2010, 12/01/2011  . Influenza,inj,Quad PF,6+ Mos 01/21/2016  . Influenza,inj,quad, With Preservative 12/04/2017  . PFIZER SARS-COV-2 Vaccination 02/27/2019, 03/24/2019  . Pneumococcal Conjugate-13 01/20/2014  . Pneumococcal Polysaccharide-23 08/01/2010  . Tdap 01/21/2016, 09/15/2017  . Zoster Recombinat (Shingrix) 12/11/2017, 02/27/2018     Objective: Vital Signs: BP (!) 187/98 (BP Location: Left Arm, Patient Position: Sitting, Cuff Size: Normal)   Pulse 91   Resp 14   Ht 5\' 5"  (1.651 m)   Wt 108 lb  9.6 oz (49.3 kg)   BMI 18.07 kg/m    Physical Exam Vitals and nursing note reviewed.  Constitutional:      Appearance: She is well-developed.  HENT:     Head: Normocephalic and atraumatic.  Eyes:     Conjunctiva/sclera: Conjunctivae normal.  Pulmonary:     Effort: Pulmonary effort is normal.  Abdominal:     Palpations: Abdomen is soft.  Musculoskeletal:     Cervical back: Normal range of motion.  Skin:    General: Skin is warm and dry.     Capillary Refill: Capillary refill takes less than 2 seconds.  Neurological:     Mental Status: She is alert and oriented to person, place, and time.  Psychiatric:        Behavior: Behavior normal.      Musculoskeletal Exam: C-spine, thoracic spine, and lumbar spine good ROM.  Shoulder joints, elbow joints, wrist joints, MCPs, PIPs, and DIPs good ROM.  Severe PIP and DIP thickening. Complete fist formation bilaterally.  Hip joints good ROM with no discomfort.  Knee joints good ROM with no warmth or effusion.  Ankle joints good ROM with no tenderness  or inflammation.  Dorsal spurs noted bilaterally.  PIP and DIP thickening consistent with osteoarthritis of both feet.  No tenderness of MTP joints.   CDAI Exam: CDAI Score: -- Patient Global: --; Provider Global: -- Swollen: --; Tender: -- Joint Exam 09/27/2019   No joint exam has been documented for this visit   There is currently no information documented on the homunculus. Go to the Rheumatology activity and complete the homunculus joint exam.  Investigation: No additional findings.  Imaging: No results found.  Recent Labs: Lab Results  Component Value Date   WBC 9.0 04/14/2019   HGB 13.3 04/14/2019   PLT 290 04/14/2019   NA 139 04/14/2019   K 4.1 04/14/2019   CL 103 04/14/2019   CO2 26 04/14/2019   GLUCOSE 86 04/14/2019   BUN 4 (L) 04/14/2019   CREATININE 0.50 04/14/2019   BILITOT 0.5 04/14/2019   ALKPHOS 83 08/30/2017   AST 21 04/14/2019   ALT 16 04/14/2019   PROT 7.3 04/14/2019   ALBUMIN 4.5 08/30/2017   CALCIUM 10.0 04/14/2019   GFRAA 118 04/14/2019   QFTBGOLD Negative 08/02/2015   QFTBGOLDPLUS NEGATIVE 01/06/2019    Speciality Comments: Prior therapy: Remicade and Humira (inadequate response) Osteoporosis managed by PCP. Last DXA August 2019.  Procedures:  No procedures performed Allergies: Codeine, Other, Penicillins, and Gluten meal   Assessment / Plan:     Visit Diagnoses: Psoriatic arthritis ([place]): She has severe thickening of all PIP and DIP joints but no active synovitis was noted on exam today.  She was able to make a complete fist bilaterally.  She experiences intermittent pain and inflammation in both hands and her left knee joint.  She has occasional plantar fasciitis bilaterally has no tenderness palpation on exam today.  No Achilles tendinitis.  She has no active psoriasis at this time but has occasional patches on her scalp and uses clobetasol topically as needed.  She has been under tremendous amount of stress for the past 6 months and has had  an increased recurrence of flares.  She is not having any joint pain or joint swelling today.  Overall she has clinically been doing well on Cosentyx 150 mg subcutaneous injections every 14 days, methotrexate 1.0 mL sq injections once weekly, folic acid 2 mg by mouth daily.  She will continue on  the current treatment regimen.  She was advised to notify us if she starts having recurrent flares.  She will follow-up in the office in 5 months.   Psoriasis: She has no active psoriasis at this time.  She has occasional patches on her scalp and uses clobetasol as needed.  She does not need a refill at this time.  She will continue on Cosentyx and methotrexate as prescribed.  High risk medication use - Cosentyx 150 mg every 14 days (started September 2019), methotrexate 1 mL every 7 days, and folic acid 1 mg 2 tablets daily.  CBC and CMP were drawn on 04/14/2019.  She is overdue to update lab work today.  Orders for CBC and CMP were released.  She will be due to update lab work in December and every 3 months to monitor for drug toxicity.  TB gold was negative on 01/06/2019 and will continue to be monitored yearly.  A future order for TB gold will be placed today.- Plan: COMPLETE METABOLIC PANEL WITH GFR, CBC with Differential/Platelet  She has received both COVID-19 vaccinations and is planning on receiving the third dose.  She was advised to hold methotrexate 1 week after receiving a third dose.  She was also encouraged to avoid taking NSAIDs and Tylenol 24 hours prior to the third dose.  She was advised to notify us or PCP if she develops a COVID-19 infection in order to receive the antibody infusion.  She will hold Cosentyx and methotrexate if she develops signs or symptoms of an infection and to resume once the infection has completely cleared.  She voiced understanding.  She was encouraged to continue to wear a mask and social distance.  Primary osteoarthritis of both hands: She has severe PIP and DIP  thickening.  She is able to make a complete fist today.  She experiences intermittent pain and inflammation in her hands.  Joint protection and muscle strengthening were discussed.  Trochanteric bursitis, left hip: She has tenderness palpation on exam today.  She experiences intermittent discomfort especially when lying on her left side at night.  She was encouraged to perform stretching exercises daily.  Age-related osteoporosis without current pathological fracture -DEXA on 09/20/2018  1/3 distal radius BMD 0.381 with T-score -5.2.  She has not had any recent falls or fractures. She is on Tymlos 80 mcg daily prescribed by Dr. Redmond School along with calcium and vitamin D.   Sicca complex ([place]) - She uses Xiidra and Refresh eye drops PRN for eye dryness.   DDD (degenerative disc disease), lumbar: Chronic pain.  She has no symptoms of radiculopathy at this time.   Other medical conditions are listed as follows:   History of hypothyroidism  History of hypertension  History of gastroesophageal reflux (GERD)  History of colonic polyps  Smoker  Orders: Orders Placed This Encounter  Procedures  . COMPLETE METABOLIC PANEL WITH GFR  . CBC with Differential/Platelet  . QuantiFERON-TB Gold Plus   No orders of the defined types were placed in this encounter.    Follow-Up Instructions: Return in about 5 months (around 02/27/2020) for Psoriatic arthritis.   Ofilia Neas, PA-C  Note - This record has been created using Dragon software.  Chart creation errors have been sought, but may not always  have been located. Such creation errors do not reflect on  the standard of medical care.

## 2019-09-27 ENCOUNTER — Ambulatory Visit (INDEPENDENT_AMBULATORY_CARE_PROVIDER_SITE_OTHER): Payer: BC Managed Care – PPO | Admitting: Physician Assistant

## 2019-09-27 ENCOUNTER — Other Ambulatory Visit: Payer: Self-pay

## 2019-09-27 ENCOUNTER — Encounter: Payer: Self-pay | Admitting: Physician Assistant

## 2019-09-27 VITALS — BP 187/98 | HR 91 | Resp 14 | Ht 65.0 in | Wt 108.6 lb

## 2019-09-27 DIAGNOSIS — L409 Psoriasis, unspecified: Secondary | ICD-10-CM | POA: Diagnosis not present

## 2019-09-27 DIAGNOSIS — Z79899 Other long term (current) drug therapy: Secondary | ICD-10-CM | POA: Diagnosis not present

## 2019-09-27 DIAGNOSIS — Z8719 Personal history of other diseases of the digestive system: Secondary | ICD-10-CM

## 2019-09-27 DIAGNOSIS — M810 Age-related osteoporosis without current pathological fracture: Secondary | ICD-10-CM

## 2019-09-27 DIAGNOSIS — L4050 Arthropathic psoriasis, unspecified: Secondary | ICD-10-CM | POA: Diagnosis not present

## 2019-09-27 DIAGNOSIS — M5136 Other intervertebral disc degeneration, lumbar region: Secondary | ICD-10-CM

## 2019-09-27 DIAGNOSIS — Z8679 Personal history of other diseases of the circulatory system: Secondary | ICD-10-CM

## 2019-09-27 DIAGNOSIS — M7062 Trochanteric bursitis, left hip: Secondary | ICD-10-CM

## 2019-09-27 DIAGNOSIS — Z8639 Personal history of other endocrine, nutritional and metabolic disease: Secondary | ICD-10-CM

## 2019-09-27 DIAGNOSIS — M19041 Primary osteoarthritis, right hand: Secondary | ICD-10-CM

## 2019-09-27 DIAGNOSIS — F17200 Nicotine dependence, unspecified, uncomplicated: Secondary | ICD-10-CM

## 2019-09-27 DIAGNOSIS — M19042 Primary osteoarthritis, left hand: Secondary | ICD-10-CM

## 2019-09-27 DIAGNOSIS — M3500 Sicca syndrome, unspecified: Secondary | ICD-10-CM

## 2019-09-27 DIAGNOSIS — Z86010 Personal history of colonic polyps: Secondary | ICD-10-CM

## 2019-09-27 NOTE — Patient Instructions (Addendum)
COVID-19 vaccine recommendations:  ° °COVID-19 vaccine is recommended for everyone (unless you are allergic to a vaccine component), even if you are on a medication that suppresses your immune system.  ° °If you are on Methotrexate, Cellcept (mycophenolate), Rinvoq, Xeljanz, and Olumiant- hold the medication for 1 week after each vaccine. Hold Methotrexate for 2 weeks after the single dose COVID-19 vaccine.  ° °If you are on Orencia subcutaneous injection - hold medication one week prior to and one week after the first COVID-19 vaccine dose (only).  ° °If you are on Orencia IV infusions- time vaccination administration so that the first COVID-19 vaccination will occur four weeks after the infusion and postpone the subsequent infusion by one week.  ° °If you are on Cyclophosphamide or Rituxan infusions please contact your doctor prior to receiving the COVID-19 vaccine.  ° °Do not take Tylenol or any anti-inflammatory medications (NSAIDs) 24 hours prior to the COVID-19 vaccination.  ° °There is no direct evidence about the efficacy of the COVID-19 vaccine in individuals who are on medications that suppress the immune system.  ° °Even if you are fully vaccinated, and you are on any medications that suppress your immune system, please continue to wear a mask, maintain at least six feet social distance and practice hand hygiene.  ° °If you develop a COVID-19 infection, please contact your PCP or our office to determine if you need antibody infusion. ° °The booster vaccine is now available for immunocompromised patients. It is advised that if you had Pfizer vaccine you should get Pfizer booster.  If you had a Moderna vaccine then you should get a Moderna booster. Johnson and Johnson does not have a booster vaccine at this time. ° °Please see the following web sites for updated information.   ° °https://www.rheumatology.org/Portals/0/Files/COVID-19-Vaccination-Patient-Resources.pdf ° °https://www.rheumatology.org/About-Us/Newsroom/Press-Releases/ID/1159 ° °Standing Labs °We placed an order today for your standing lab work.  ° °Please have your standing labs drawn in December and every 3 months  ° °If possible, please have your labs drawn 2 weeks prior to your appointment so that the provider can discuss your results at your appointment. ° °We have open lab daily °Monday through Thursday from 8:30-12:30 PM and 1:30-4:30 PM and Friday from 8:30-12:30 PM and 1:30-4:00 PM °at the office of Dr. Shaili Deveshwar, [facility].   °Please be advised, patients with office appointments requiring lab work will take precedents over walk-in lab work.  °If possible, please come for your lab work on Monday and Friday afternoons, as you may experience shorter wait times. °The office is located at [address], [address], [place], [place] [address] °No appointment is necessary.   °Labs are drawn by [facility]. Please bring your co-pay at the time of your lab draw.  You may receive a bill from [facility] for your lab work. ° °If you wish to have your labs drawn at another location, please call the office 24 hours in advance to send orders. ° °If you have any questions regarding directions or hours of operation,  °please call [phone].   °As a reminder, please drink plenty of water prior to coming for your lab work. Thanks! ° ° °

## 2019-09-28 LAB — CBC WITH DIFFERENTIAL/PLATELET
Absolute Monocytes: 315 cells/uL (ref 200–950)
Basophils Absolute: 8 cells/uL (ref 0–200)
Basophils Relative: 0.1 %
Eosinophils Absolute: 60 cells/uL (ref 15–500)
Eosinophils Relative: 0.8 %
HCT: 43 % (ref 35.0–45.0)
Hemoglobin: 14.2 g/dL (ref 11.7–15.5)
Lymphs Abs: 2123 cells/uL (ref 850–3900)
MCH: 31.7 pg (ref 27.0–33.0)
MCHC: 33 g/dL (ref 32.0–36.0)
MCV: 96 fL (ref 80.0–100.0)
MPV: 10.7 fL (ref 7.5–12.5)
Monocytes Relative: 4.2 %
Neutro Abs: 4995 cells/uL (ref 1500–7800)
Neutrophils Relative %: 66.6 %
Platelets: 308 10*3/uL (ref 140–400)
RBC: 4.48 10*6/uL (ref 3.80–5.10)
RDW: 13.1 % (ref 11.0–15.0)
Total Lymphocyte: 28.3 %
WBC: 7.5 10*3/uL (ref 3.8–10.8)

## 2019-09-28 LAB — COMPLETE METABOLIC PANEL WITH GFR
AG Ratio: 1.4 (calc) (ref 1.0–2.5)
ALT: 9 U/L (ref 6–29)
AST: 14 U/L (ref 10–35)
Albumin: 4 g/dL (ref 3.6–5.1)
Alkaline phosphatase (APISO): 91 U/L (ref 37–153)
BUN/Creatinine Ratio: 5 (calc) — ABNORMAL LOW (ref 6–22)
BUN: 3 mg/dL — ABNORMAL LOW (ref 7–25)
CO2: 27 mmol/L (ref 20–32)
Calcium: 9.8 mg/dL (ref 8.6–10.4)
Chloride: 104 mmol/L (ref 98–110)
Creat: 0.62 mg/dL (ref 0.50–0.99)
GFR, Est African American: 109 mL/min/{1.73_m2} (ref >=60)
GFR, Est Non African American: 94 mL/min/{1.73_m2} (ref >=60)
Globulin: 2.9 g/dL (calc) (ref 1.9–3.7)
Glucose, Bld: 91 mg/dL (ref 65–99)
Potassium: 4.4 mmol/L (ref 3.5–5.3)
Sodium: 139 mmol/L (ref 135–146)
Total Bilirubin: 0.8 mg/dL (ref 0.2–1.2)
Total Protein: 6.9 g/dL (ref 6.1–8.1)

## 2019-09-28 NOTE — Progress Notes (Signed)
BUN is low.  Creatinine and GFR are WNL.  Please forward labs to PCP.  Rest of CMP WNL.  CBC WNL.

## 2019-09-29 ENCOUNTER — Other Ambulatory Visit: Payer: Self-pay | Admitting: *Deleted

## 2019-09-29 DIAGNOSIS — L4050 Arthropathic psoriasis, unspecified: Secondary | ICD-10-CM

## 2019-09-29 MED ORDER — METHOTREXATE SODIUM CHEMO INJECTION 250 MG/10ML
INTRAMUSCULAR | 0 refills | Status: DC
Start: 2019-09-29 — End: 2020-01-08

## 2019-09-29 MED ORDER — COSENTYX SENSOREADY (300 MG) 150 MG/ML ~~LOC~~ SOAJ: SUBCUTANEOUS | 0 refills | Status: DC

## 2019-09-29 NOTE — Telephone Encounter (Signed)
Per your note refill Cosentyx and MTX after labs

## 2019-09-29 NOTE — Telephone Encounter (Signed)
-----   Message from Ofilia Neas, PA-C sent at 09/28/2019 12:26 PM EDT ----- BUN is low.  Creatinine and GFR are WNL.  Please forward labs to PCP.  Rest of CMP WNL.  CBC WNL.

## 2019-09-30 ENCOUNTER — Other Ambulatory Visit: Payer: Self-pay | Admitting: Family Medicine

## 2019-09-30 DIAGNOSIS — I10 Essential (primary) hypertension: Secondary | ICD-10-CM

## 2019-10-09 ENCOUNTER — Other Ambulatory Visit: Payer: Self-pay | Admitting: Family Medicine

## 2019-10-09 DIAGNOSIS — E785 Hyperlipidemia, unspecified: Secondary | ICD-10-CM

## 2019-10-23 DIAGNOSIS — Z1231 Encounter for screening mammogram for malignant neoplasm of breast: Secondary | ICD-10-CM | POA: Diagnosis not present

## 2019-10-23 DIAGNOSIS — Z1211 Encounter for screening for malignant neoplasm of colon: Secondary | ICD-10-CM | POA: Diagnosis not present

## 2019-10-23 DIAGNOSIS — Z01419 Encounter for gynecological examination (general) (routine) without abnormal findings: Secondary | ICD-10-CM | POA: Diagnosis not present

## 2019-10-23 DIAGNOSIS — M810 Age-related osteoporosis without current pathological fracture: Secondary | ICD-10-CM | POA: Diagnosis not present

## 2019-10-26 DIAGNOSIS — M810 Age-related osteoporosis without current pathological fracture: Secondary | ICD-10-CM | POA: Diagnosis not present

## 2019-12-07 ENCOUNTER — Other Ambulatory Visit: Payer: Self-pay

## 2019-12-07 DIAGNOSIS — E038 Other specified hypothyroidism: Secondary | ICD-10-CM

## 2019-12-07 MED ORDER — LEVOTHYROXINE SODIUM 25 MCG PO TABS: 25.0000 ug | ORAL_TABLET | Freq: Every day | ORAL | 3 refills | Status: DC

## 2019-12-13 NOTE — Progress Notes (Signed)
Office Visit Note  Patient: April Cox             Date of Birth: 19-Jun-1955           MRN: [id number]             PCP: Denita Lung, MD Referring: Denita Lung, MD Visit Date: 12/27/2019 Occupation: @GUAROCC @  Subjective:  Medication monitoring  History of Present Illness: April Cox is a 64 y.o. female with history of psoriatic arthritis, osteoarthritis, and osteoporosis. She is on cosentyx 150 mg sq injections every 14 days, MTX 1.0 ml sq injections once weekly, and folic acid 2 mg po daily.  She has not missed any doses of Cosentyx or methotrexate recently.  She denies any recent flares.  She states that she has occasional pain and inflammation in both hands but has not had any swelling at this time.  She continues to have chronic left SI joint pain.  She denies any Achilles tendinitis or plantar fasciitis at this time.  She denies any active psoriasis. She has not had any recent infections.  She has received both COVID-19 vaccinations as well as the booster dose.  She is also receiving annual influenza vaccine. She is on tymlos daily injections and is taking calcium and vitamin D for management of osteoporosis.  She had an updated bone density this fall ordered by her gynecologist.  Activities of Daily Living:  Patient reports morning stiffness for 5-30  minutes.   Patient Denies nocturnal pain.  Difficulty dressing/grooming: Denies Difficulty climbing stairs: Denies Difficulty getting out of chair: Denies Difficulty using hands for taps, buttons, cutlery, and/or writing: Reports  Review of Systems  Constitutional: Negative for fatigue.  HENT: Positive for mouth dryness and nose dryness. Negative for mouth sores.   Eyes: Positive for dryness. Negative for pain and visual disturbance.  Respiratory: Negative for cough, hemoptysis, shortness of breath and difficulty breathing.   Cardiovascular: Negative for chest pain, palpitations, hypertension and swelling in  legs/feet.  Gastrointestinal: Positive for diarrhea. Negative for blood in stool and constipation.  Endocrine: Negative for increased urination.  Genitourinary: Negative for painful urination.  Musculoskeletal: Positive for arthralgias, joint pain, joint swelling and morning stiffness. Negative for myalgias, muscle weakness, muscle tenderness and myalgias.  Skin: Negative for color change, pallor, rash, hair loss, nodules/bumps, skin tightness, ulcers and sensitivity to sunlight.  Allergic/Immunologic: Negative for susceptible to infections.  Neurological: Negative for dizziness, numbness, headaches and weakness.  Hematological: Negative for swollen glands.  Psychiatric/Behavioral: Positive for depressed mood. Negative for sleep disturbance. The patient is nervous/anxious.     PMFS History:  Patient Active Problem List   Diagnosis Date Noted  . Sicca complex ([place]) 04/01/2017  . DDD (degenerative disc disease), lumbar 04/01/2017  . Psoriasis 04/01/2017  . Dry eye 01/01/2017  . Spondylosis of lumbar region without myelopathy or radiculopathy 01/07/2016  . High risk medication use 01/07/2016  . Essential hypertension 09/10/2014  . History of HPV infection 09/10/2014  . History of colonic polyps 09/10/2014  . Osteoporosis 09/10/2014  . Gastroesophageal reflux disease without esophagitis 09/10/2014  . Cigarette smoker 05/11/2011  . Hyperlipidemia 08/22/2010  . Hypothyroidism 08/22/2010  . Psoriatic arthritis ([place]) 08/22/2010    Past Medical History:  Diagnosis Date  . Abnormal Pap smear of cervix   . Anemia    history of anemia  . Arthritis   . Cataract   . Diverticulosis of colon   . Family history of adverse reaction to anesthesia  mother PONV  . GERD (gastroesophageal reflux disease)   . Heart murmur    asymptomatic per pt  . History of adenomatous polyp of colon    11-06-2009  tubullar  . HPV in female   . Hyperlipidemia   . Hypertension   . Hypothyroidism   . Mild  aortic valve stenosis    per echo 10-09-2010  ef 60-65%  valva area 1.48cm  . Osteoporosis   . Psoriasis   . Psoriatic arthritis ([place])    Dr. Estanislado Pandy  . Wears glasses     Family History  Problem Relation Age of Onset  . Arthritis Mother        OA   . Psoriasis Mother   . Other Father        accidental death/MVA at age 5  . Hypertension Brother   . Other Brother        1 brother drowned  . Cancer Brother   . Hypertension Maternal Grandmother   . Diabetes Maternal Grandmother   . Hypertension Maternal Grandfather   . Heart disease Neg Hx   . Colon cancer Neg Hx   . Colon polyps Neg Hx   . Stomach cancer Neg Hx   . Esophageal cancer Neg Hx    Past Surgical History:  Procedure Laterality Date  . ABDOMINAL HYSTERECTOMY    . ANTERIOR LAT LUMBAR FUSION  12/25/2011   Procedure: ANTERIOR LATERAL LUMBAR FUSION 1 LEVEL;  Surgeon: Eustace Moore, MD;  Location: [facility];  Service: Neurosurgery;  Laterality: Left;  LUMBAR FOUR-FIVE  . BLADDER SUSPENSION N/A 12/19/2014   Procedure: TRANSVAGINAL TAPE (TVT) PROCEDURE;  Surgeon: Everett Graff, MD;  Location: [facility];  Service: Gynecology;  Laterality: N/A;  . CARPAL TUNNEL RELEASE Right April 2015  . CERVICAL CONIZATION W/BX N/A 08/28/2014   Procedure: CONIZATION CERVIX WITH BIOPSY;  Surgeon: Ena Dawley, MD;  Location: [facility];  Service: Gynecology;  Laterality: N/A;  . COLONOSCOPY  11-06-2009  . COLONOSCOPY  08/2019  . CYSTO N/A 12/19/2014   Procedure: CYSTO;  Surgeon: Everett Graff, MD;  Location: [facility];  Service: Gynecology;  Laterality: N/A;  . DENTAL SURGERY    . HYSTEROSCOPY WITH D & C  04-04-2008  . KNEE ARTHROPLASTY    . LUMBAR PERCUTANEOUS PEDICLE SCREW 1 LEVEL  12/25/2011   Procedure: LUMBAR PERCUTANEOUS PEDICLE SCREW 1 LEVEL;  Surgeon: Eustace Moore, MD;  Location: [facility];  Service: Neurosurgery;  Laterality: Left;  LUMBAR FOUR-FIVE  . PERONEAL NERVE DECOMPRESSION Left 12-27-2009   for  neuropathy with  foot drop  . POLYPECTOMY    . TRANSTHORACIC ECHOCARDIOGRAM  10-09-2010   mild LVH,  ef 60-65%,  grade I diastolic dysfunction,  very mild AV stenosis (area 1.48cm/S/2(VTI) with no regurg./  mild MV calcification without stenosis or regur. /  trivial TR  . VAGINAL HYSTERECTOMY Bilateral 12/19/2014   Procedure: TOTAL VAGINAL HYSTERECTOMY WITH BILATERAL SALPINGECTOMY;  Surgeon: Ena Dawley, MD;  Location: [facility];  Service: Gynecology;  Laterality: Bilateral;   Social History   Social History Narrative  . Not on file   Immunization History  Administered Date(s) Administered  . Fluad Quad(high Dose 65+) 10/11/2018  . Influenza Split 12/19/2010, 12/01/2011  . Influenza,inj,Quad PF,6+ Mos 01/21/2016  . Influenza,inj,quad, With Preservative 12/04/2017  . PFIZER SARS-COV-2 Vaccination 02/27/2019, 03/24/2019, 10/02/2019  . Pneumococcal Conjugate-13 01/20/2014  . Pneumococcal Polysaccharide-23 08/01/2010  . Tdap 01/21/2016, 09/15/2017  . Zoster Recombinat (Shingrix) 12/11/2017, 02/27/2018  Objective: Vital Signs: BP (!) 170/94 (BP Location: Left Arm, Patient Position: Sitting, Cuff Size: Normal)   Pulse 71   Resp 14   Ht 5\' 5"  (1.651 m)   Wt 106 lb 9.6 oz (48.4 kg)   BMI 17.74 kg/m    Physical Exam Vitals and nursing note reviewed.  Constitutional:      Appearance: She is well-developed.  HENT:     Head: Normocephalic and atraumatic.  Eyes:     Conjunctiva/sclera: Conjunctivae normal.  Pulmonary:     Effort: Pulmonary effort is normal.  Abdominal:     Palpations: Abdomen is soft.  Musculoskeletal:     Cervical back: Normal range of motion.  Skin:    General: Skin is warm and dry.     Capillary Refill: Capillary refill takes less than 2 seconds.  Neurological:     Mental Status: She is alert and oriented to person, place, and time.  Psychiatric:        Behavior: Behavior normal.      Musculoskeletal Exam:  C-spine, thoracic spine, and lumbar spine  good ROM. No midline spinal tenderness.  Left SI joint tenderness.  Shoulder joints, elbow joints, wrist joints, MCPs, PIPs, and DIPs good ROM with no synovitis.  Complete fist formation bilaterally.  Severe PIP and DIP thickening but no tenderness or inflammation. CMC joint thickening bilaterally.  Hip joints, knee joints, and ankle joints good ROM with no discomfort.  No warmth or effusion of knee joints.  No tenderness or swelling of ankle joints.  No achilles tendonitis or plantar fasciitis.  Tenderness of the left 1st MTP joint.    CDAI Exam: CDAI Score: -- Patient Global: --; Provider Global: -- Swollen: --; Tender: -- Joint Exam 12/27/2019   No joint exam has been documented for this visit   There is currently no information documented on the homunculus. Go to the Rheumatology activity and complete the homunculus joint exam.  Investigation: No additional findings.  Imaging: No results found.  Recent Labs: Lab Results  Component Value Date   WBC 7.5 09/27/2019   HGB 14.2 09/27/2019   PLT 308 09/27/2019   NA 139 09/27/2019   K 4.4 09/27/2019   CL 104 09/27/2019   CO2 27 09/27/2019   GLUCOSE 91 09/27/2019   BUN 3 (L) 09/27/2019   CREATININE 0.62 09/27/2019   BILITOT 0.8 09/27/2019   ALKPHOS 83 08/30/2017   AST 14 09/27/2019   ALT 9 09/27/2019   PROT 6.9 09/27/2019   ALBUMIN 4.5 08/30/2017   CALCIUM 9.8 09/27/2019   GFRAA 109 09/27/2019   QFTBGOLD Negative 08/02/2015   QFTBGOLDPLUS NEGATIVE 01/06/2019    Speciality Comments: Prior therapy: Remicade and Humira (inadequate response) Osteoporosis managed by PCP. Last DXA August 2019.  Procedures:  No procedures performed Allergies: Codeine, Other, Penicillins, and Gluten meal   Assessment / Plan:     Visit Diagnoses: Psoriatic arthritis ([place]): She has no synovitis or dactylitis on exam.  She has not had any recent psoriatic arthritis flares.  She is clinically doing well on Cosentyx 150 mg subcutaneous injections  every 14 days, methotrexate 1 mL sq injections once weekly, and folic acid 2 mg by mouth daily.  She has not missed any doses of Cosentyx or methotrexate recently.  She experiences intermittent pain and inflammation in both hands but overall she has noticed significant clinical improvement on combination therapy.  She has tenderness over the left SI joint on exam today and continues to have persistent discomfort and  stiffness.  She has not had any episodes of Achilles denies or plantar fasciitis recently.  She has no active psoriasis currently.  We discussed the importance of regular exercise.  She will continue on the current treatment regimen.  She does not need any refills at this time.  She was advised to notify us if she develops increased joint pain or joint swelling.  She will follow-up in the office in 3 months.  Psoriasis: She has no active psoriasis at this time.   High risk medication use - Cosentyx 150 mg every 14 days (started September 2019), methotrexate 1 mL every 7 days, and folic acid 1 mg 2 tablets daily. Inadequate response to remicade in the past.  TB gold negative on 01/06/19.  She is due to update TB gold today.  CBC and CMP updated on 09/27/19.  She is due to update CBC and CMP today.  Orders were released.  Her next lab work will be due in March and every 3 months.  Standing orders for CBC and CMP remain in place. - Plan: CBC with Differential/Platelet, COMPLETE METABOLIC PANEL WITH GFR, QuantiFERON-TB Gold Plus She has not had any recent infections.  She was advised to hold Cosentyx and methotrexate if she develops signs or symptoms of an infection and to resume once the infection has completely cleared. She has received both COVID-19 vaccinations as well as the booster dose.  She has also receiving annual influenza vaccine.  Primary osteoarthritis of both hands: She has severe PIP and DIP thickening.  CMC joint thickening noted bilaterally.  No inflammation was noted on examination  today.  Trochanteric bursitis, left hip: Resolved.  She has no tenderness to palpation on exam.   Age-related osteoporosis without current pathological fracture - DEXA on 09/20/2018  1/3 distal radius BMD 0.381 with T-score -5.2.  She had a recent updated DEXA ordered by her gynecologist.  We will call to obtain these records.  She continues to inject Tymlos 80 mcg into skin daily and takes calcium and vitamin D as recommended.  She has not had any recent falls or fractures. No midline spinal tenderness.   Sicca complex ([place]) - She has ongoing mouth, nose, and eye dryness.  She uses Xiidra and Refresh eye drops PRN for eye dryness.   DDD (degenerative disc disease), lumbar: No midline spinal tenderness.  No symptoms of radiculopathy.  Other medical conditions are listed as follows:  History of hypothyroidism  History of gastroesophageal reflux (GERD)  History of hypertension  History of colonic polyps  Smoker  Screening for tuberculosis - Plan: QuantiFERON-TB Gold Plus  Orders: Orders Placed This Encounter  Procedures  . CBC with Differential/Platelet  . COMPLETE METABOLIC PANEL WITH GFR  . QuantiFERON-TB Gold Plus   No orders of the defined types were placed in this encounter.     Follow-Up Instructions: Return in about 3 months (around 03/26/2020) for Psoriatic arthritis, Osteoarthritis, Osteoporosis.   Ofilia Neas, PA-C  Note - This record has been created using Dragon software.  Chart creation errors have been sought, but may not always  have been located. Such creation errors do not reflect on  the standard of medical care.

## 2019-12-25 ENCOUNTER — Other Ambulatory Visit: Payer: Self-pay | Admitting: Family Medicine

## 2019-12-25 NOTE — Telephone Encounter (Signed)
April Cox is requesting to fill pt epinephrine  Pen. Please [facility]

## 2019-12-27 ENCOUNTER — Other Ambulatory Visit: Payer: Self-pay

## 2019-12-27 ENCOUNTER — Ambulatory Visit (INDEPENDENT_AMBULATORY_CARE_PROVIDER_SITE_OTHER): Payer: BC Managed Care – PPO | Admitting: Physician Assistant

## 2019-12-27 ENCOUNTER — Encounter: Payer: Self-pay | Admitting: Physician Assistant

## 2019-12-27 VITALS — BP 170/94 | HR 71 | Resp 14 | Ht 65.0 in | Wt 106.6 lb

## 2019-12-27 DIAGNOSIS — M810 Age-related osteoporosis without current pathological fracture: Secondary | ICD-10-CM

## 2019-12-27 DIAGNOSIS — M7062 Trochanteric bursitis, left hip: Secondary | ICD-10-CM

## 2019-12-27 DIAGNOSIS — L4050 Arthropathic psoriasis, unspecified: Secondary | ICD-10-CM

## 2019-12-27 DIAGNOSIS — Z86010 Personal history of colonic polyps: Secondary | ICD-10-CM

## 2019-12-27 DIAGNOSIS — M3500 Sicca syndrome, unspecified: Secondary | ICD-10-CM

## 2019-12-27 DIAGNOSIS — Z79899 Other long term (current) drug therapy: Secondary | ICD-10-CM | POA: Diagnosis not present

## 2019-12-27 DIAGNOSIS — Z8639 Personal history of other endocrine, nutritional and metabolic disease: Secondary | ICD-10-CM

## 2019-12-27 DIAGNOSIS — M19041 Primary osteoarthritis, right hand: Secondary | ICD-10-CM | POA: Diagnosis not present

## 2019-12-27 DIAGNOSIS — Z8719 Personal history of other diseases of the digestive system: Secondary | ICD-10-CM

## 2019-12-27 DIAGNOSIS — M5136 Other intervertebral disc degeneration, lumbar region: Secondary | ICD-10-CM

## 2019-12-27 DIAGNOSIS — L409 Psoriasis, unspecified: Secondary | ICD-10-CM

## 2019-12-27 DIAGNOSIS — Z8679 Personal history of other diseases of the circulatory system: Secondary | ICD-10-CM

## 2019-12-27 DIAGNOSIS — M19042 Primary osteoarthritis, left hand: Secondary | ICD-10-CM

## 2019-12-27 DIAGNOSIS — Z111 Encounter for screening for respiratory tuberculosis: Secondary | ICD-10-CM | POA: Diagnosis not present

## 2019-12-27 DIAGNOSIS — F17200 Nicotine dependence, unspecified, uncomplicated: Secondary | ICD-10-CM

## 2019-12-28 NOTE — Progress Notes (Signed)
BUN is low but stable. Rest of CMP WNL. CBC WNL.

## 2019-12-30 ENCOUNTER — Other Ambulatory Visit: Payer: Self-pay | Admitting: Physician Assistant

## 2019-12-30 LAB — COMPLETE METABOLIC PANEL WITH GFR
AG Ratio: 1.4 (calc) (ref 1.0–2.5)
ALT: 11 U/L (ref 6–29)
AST: 15 U/L (ref 10–35)
Albumin: 4 g/dL (ref 3.6–5.1)
Alkaline phosphatase (APISO): 87 U/L (ref 37–153)
BUN/Creatinine Ratio: 5 (calc) — ABNORMAL LOW (ref 6–22)
BUN: 3 mg/dL — ABNORMAL LOW (ref 7–25)
CO2: 30 mmol/L (ref 20–32)
Calcium: 9.7 mg/dL (ref 8.6–10.4)
Chloride: 105 mmol/L (ref 98–110)
Creat: 0.61 mg/dL (ref 0.50–0.99)
GFR, Est African American: 109 mL/min/{1.73_m2} (ref >=60)
GFR, Est Non African American: 94 mL/min/{1.73_m2} (ref >=60)
Globulin: 2.8 g/dL (calc) (ref 1.9–3.7)
Glucose, Bld: 83 mg/dL (ref 65–99)
Potassium: 4.5 mmol/L (ref 3.5–5.3)
Sodium: 139 mmol/L (ref 135–146)
Total Bilirubin: 0.6 mg/dL (ref 0.2–1.2)
Total Protein: 6.8 g/dL (ref 6.1–8.1)

## 2019-12-30 LAB — CBC WITH DIFFERENTIAL/PLATELET
Absolute Monocytes: 353 cells/uL (ref 200–950)
Basophils Absolute: 8 cells/uL (ref 0–200)
Basophils Relative: 0.1 %
Eosinophils Absolute: 41 cells/uL (ref 15–500)
Eosinophils Relative: 0.5 %
HCT: 40.3 % (ref 35.0–45.0)
Hemoglobin: 13.7 g/dL (ref 11.7–15.5)
Lymphs Abs: 2919 cells/uL (ref 850–3900)
MCH: 32.2 pg (ref 27.0–33.0)
MCHC: 34 g/dL (ref 32.0–36.0)
MCV: 94.8 fL (ref 80.0–100.0)
MPV: 10.3 fL (ref 7.5–12.5)
Monocytes Relative: 4.3 %
Neutro Abs: 4879 cells/uL (ref 1500–7800)
Neutrophils Relative %: 59.5 %
Platelets: 326 10*3/uL (ref 140–400)
RBC: 4.25 10*6/uL (ref 3.80–5.10)
RDW: 12.9 % (ref 11.0–15.0)
Total Lymphocyte: 35.6 %
WBC: 8.2 10*3/uL (ref 3.8–10.8)

## 2019-12-30 LAB — QUANTIFERON-TB GOLD PLUS
Mitogen-NIL: 8.62 IU/mL
NIL: 0.02 IU/mL
QuantiFERON-TB Gold Plus: NEGATIVE
TB1-NIL: <0 IU/mL
TB2-NIL: 0 IU/mL

## 2020-01-01 NOTE — Telephone Encounter (Signed)
Last Visit: 12/27/2019 Next Visit: 04/02/2020  Okay to refill per Dr. Estanislado Pandy

## 2020-01-01 NOTE — Progress Notes (Signed)
TB gold negative

## 2020-01-02 ENCOUNTER — Other Ambulatory Visit: Payer: Self-pay | Admitting: Family Medicine

## 2020-01-02 DIAGNOSIS — I10 Essential (primary) hypertension: Secondary | ICD-10-CM

## 2020-01-08 ENCOUNTER — Other Ambulatory Visit: Payer: Self-pay | Admitting: Physician Assistant

## 2020-01-08 ENCOUNTER — Other Ambulatory Visit: Payer: Self-pay | Admitting: Family Medicine

## 2020-01-08 DIAGNOSIS — E785 Hyperlipidemia, unspecified: Secondary | ICD-10-CM

## 2020-01-08 NOTE — Telephone Encounter (Signed)
Last Visit: 12/27/2019 Next Visit: 04/02/2020 Labs: 12/27/2019 BUN is low but stable. Rest of CMP WNL. CBC WNL.   Current Dose per office note on 12/27/2019: methotrexate 1 mL sq injections once weekly Dx: Psoriatic arthritis   Okay to refill MTX?

## 2020-01-09 ENCOUNTER — Telehealth: Payer: Self-pay

## 2020-01-09 NOTE — Telephone Encounter (Signed)
Pt called to find out why her atorvastatin was denied, says she already had her CPE & has her next years already scheduled. Dr. Redmond School has been filling this for a long time.  I could not explain why it was denied.  Looks like it was D/C by an Therapist, sports during a endoscopy procedure put I don't see where Dr. Redmond School took her off this.   Please call patient and let her know why you denied her cholesterol medication.

## 2020-01-10 ENCOUNTER — Other Ambulatory Visit: Payer: Self-pay

## 2020-01-10 MED ORDER — ATORVASTATIN CALCIUM 20 MG PO TABS: 20.0000 mg | ORAL_TABLET | Freq: Every day | ORAL | 1 refills | Status: DC

## 2020-01-10 NOTE — Telephone Encounter (Signed)
Sent pt a mychart message and lvm . [place]

## 2020-01-16 NOTE — Telephone Encounter (Signed)
done

## 2020-03-18 ENCOUNTER — Other Ambulatory Visit: Payer: Self-pay | Admitting: Physician Assistant

## 2020-03-18 NOTE — Telephone Encounter (Signed)
Last Visit: 12/27/2019 Next Visit: 04/02/2020  Current Dose per office note on 82/05/7491, folic acid 1 mg 2 tablets daily. Dx: Psoriatic arthritis   Last Fill:04/12/2019  Okay to refill folic acid?

## 2020-03-19 NOTE — Progress Notes (Signed)
Office Visit Note  Patient: April Cox             Date of Birth: 1955/11/05           MRN: [id number]             PCP: Denita Lung, MD Referring: Denita Lung, MD Visit Date: 04/02/2020 Occupation: @GUAROCC @  Subjective:  Medication management.   History of Present Illness: April Cox is a 65 y.o. female with history of psoriatic arthritis and psoriasis.  She states she has been doing very well on combination of Cosentyx and methotrexate.  She has not had a major flare.  She states she has occasional discomfort in her joint which lasts for a day or 2 and then resolves.  She denies any psoriasis flares recently.  She continues to have sicca symptoms which is manageable.  She has occasional discomfort in the left trochanteric bursa and the lower back.  Activities of Daily Living:  Patient reports morning stiffness for 10-40 minutes.   Patient Denies nocturnal pain.  Difficulty dressing/grooming: Denies Difficulty climbing stairs: Denies Difficulty getting out of chair: Denies Difficulty using hands for taps, buttons, cutlery, and/or writing: Reports  Review of Systems  Constitutional: Negative for fatigue.  HENT: Positive for mouth dryness and nose dryness. Negative for mouth sores.   Eyes: Positive for dryness. Negative for pain and itching.  Respiratory: Negative for shortness of breath and difficulty breathing.   Cardiovascular: Negative for chest pain and palpitations.  Gastrointestinal: Negative for blood in stool, constipation and diarrhea.  Endocrine: Negative for increased urination.  Genitourinary: Negative for difficulty urinating.  Musculoskeletal: Positive for arthralgias, joint pain, joint swelling and morning stiffness. Negative for myalgias, muscle tenderness and myalgias.  Skin: Positive for color change. Negative for rash and redness.  Allergic/Immunologic: Negative for susceptible to infections.  Neurological: Negative for dizziness, numbness,  headaches, memory loss and weakness.  Hematological: Positive for bruising/bleeding tendency.  Psychiatric/Behavioral: Negative for confusion.    PMFS History:  Patient Active Problem List   Diagnosis Date Noted   Sicca complex ([place]) 04/01/2017   DDD (degenerative disc disease), lumbar 04/01/2017   Psoriasis 04/01/2017   Dry eye 01/01/2017   Spondylosis of lumbar region without myelopathy or radiculopathy 01/07/2016   High risk medication use 01/07/2016   Essential hypertension 09/10/2014   History of HPV infection 09/10/2014   History of colonic polyps 09/10/2014   Osteoporosis 09/10/2014   Gastroesophageal reflux disease without esophagitis 09/10/2014   Cigarette smoker 05/11/2011   Hyperlipidemia 08/22/2010   Hypothyroidism 08/22/2010   Psoriatic arthritis ([place]) 08/22/2010    Past Medical History:  Diagnosis Date   Abnormal Pap smear of cervix    Anemia    history of anemia   Arthritis    Cataract    Diverticulosis of colon    Family history of adverse reaction to anesthesia    mother PONV   GERD (gastroesophageal reflux disease)    Heart murmur    asymptomatic per pt   History of adenomatous polyp of colon    11-06-2009  tubullar   HPV in female    Hyperlipidemia    Hypertension    Hypothyroidism    Mild aortic valve stenosis    per echo 10-09-2010  ef 60-65%  valva area 1.48cm   Osteoporosis    Psoriasis    Psoriatic arthritis ([place])    Dr. Estanislado Pandy   Wears glasses     Family History  Problem Relation  Age of Onset   Arthritis Mother        OA    Psoriasis Mother    Other Father        accidental death/MVA at age 66   Hypertension Brother    Other Brother        1 brother drowned   Cancer Brother    Hypertension Maternal Grandmother    Diabetes Maternal Grandmother    Hypertension Maternal Grandfather    Heart disease Neg Hx    Colon cancer Neg Hx    Colon polyps Neg Hx    Stomach cancer Neg Hx     Esophageal cancer Neg Hx    Past Surgical History:  Procedure Laterality Date   ABDOMINAL HYSTERECTOMY     ANTERIOR LAT LUMBAR FUSION  12/25/2011   Procedure: ANTERIOR LATERAL LUMBAR FUSION 1 LEVEL;  Surgeon: Eustace Moore, MD;  Location: [facility];  Service: Neurosurgery;  Laterality: Left;  LUMBAR FOUR-FIVE   BLADDER SUSPENSION N/A 12/19/2014   Procedure: TRANSVAGINAL TAPE (TVT) PROCEDURE;  Surgeon: Everett Graff, MD;  Location: [facility];  Service: Gynecology;  Laterality: N/A;   CARPAL TUNNEL RELEASE Right April 2015   CERVICAL CONIZATION W/BX N/A 08/28/2014   Procedure: CONIZATION CERVIX WITH BIOPSY;  Surgeon: Ena Dawley, MD;  Location: [facility];  Service: Gynecology;  Laterality: N/A;   COLONOSCOPY  11-06-2009   COLONOSCOPY  08/2019   CYSTO N/A 12/19/2014   Procedure: CYSTO;  Surgeon: Everett Graff, MD;  Location: [facility];  Service: Gynecology;  Laterality: N/A;   DENTAL SURGERY     HYSTEROSCOPY WITH D & C  04-04-2008   KNEE ARTHROPLASTY     LUMBAR PERCUTANEOUS PEDICLE SCREW 1 LEVEL  12/25/2011   Procedure: LUMBAR PERCUTANEOUS PEDICLE SCREW 1 LEVEL;  Surgeon: Eustace Moore, MD;  Location: [facility];  Service: Neurosurgery;  Laterality: Left;  LUMBAR FOUR-FIVE   PERONEAL NERVE DECOMPRESSION Left 12-27-2009   for neuropathy with  foot drop   POLYPECTOMY     TRANSTHORACIC ECHOCARDIOGRAM  10-09-2010   mild LVH,  ef 60-65%,  grade I diastolic dysfunction,  very mild AV stenosis (area 1.48cm/S/2(VTI) with no regurg./  mild MV calcification without stenosis or regur. /  trivial TR   VAGINAL HYSTERECTOMY Bilateral 12/19/2014   Procedure: TOTAL VAGINAL HYSTERECTOMY WITH BILATERAL SALPINGECTOMY;  Surgeon: Ena Dawley, MD;  Location: [facility];  Service: Gynecology;  Laterality: Bilateral;   Social History   Social History Narrative   Not on file   Immunization History  Administered Date(s) Administered   Fluad Quad(high Dose 65+) 10/11/2018    Influenza Split 12/19/2010, 12/01/2011   Influenza,inj,Quad PF,6+ Mos 01/21/2016   Influenza,inj,quad, With Preservative 12/04/2017   PFIZER(Purple Top)SARS-COV-2 Vaccination 02/27/2019, 03/24/2019, 10/02/2019   Pneumococcal Conjugate-13 01/20/2014   Pneumococcal Polysaccharide-23 08/01/2010   Tdap 01/21/2016, 09/15/2017   Zoster Recombinat (Shingrix) 12/11/2017, 02/27/2018     Objective: Vital Signs: BP (!) 151/79 (BP Location: Right Arm, Patient Position: Sitting, Cuff Size: Normal)    Pulse 82    Resp 14    Ht 5\' 6"  (1.676 m)    Wt 111 lb (50.3 kg)    BMI 17.92 kg/m    Physical Exam Vitals and nursing note reviewed.  Constitutional:      Appearance: She is well-developed.  HENT:     Head: Normocephalic and atraumatic.  Eyes:     Conjunctiva/sclera: Conjunctivae normal.  Cardiovascular:     Rate and Rhythm: Normal rate and  regular rhythm.     Heart sounds: Normal heart sounds.  Pulmonary:     Effort: Pulmonary effort is normal.     Breath sounds: Normal breath sounds.  Abdominal:     General: Bowel sounds are normal.     Palpations: Abdomen is soft.  Musculoskeletal:     Cervical back: Normal range of motion.  Lymphadenopathy:     Cervical: No cervical adenopathy.  Skin:    General: Skin is warm and dry.     Capillary Refill: Capillary refill takes less than 2 seconds.  Neurological:     Mental Status: She is alert and oriented to person, place, and time.  Psychiatric:        Behavior: Behavior normal.      Musculoskeletal Exam: C-spine was in good range of motion.  Shoulder joints, elbow joints, wrist joints with good range of motion.  She has synovial thickening of bilateral PIP and DIP joints.  Hip joints and knee joints with good range of motion.  She had no tenderness over ankles or MTPs or PIPs.  CDAI Exam: CDAI Score: 0.4  Patient Global: 2 mm; Provider Global: 2 mm Swollen: 0 ; Tender: 0  Joint Exam 04/02/2020   No joint exam has been  documented for this visit   There is currently no information documented on the homunculus. Go to the Rheumatology activity and complete the homunculus joint exam.  Investigation: No additional findings.  Imaging: No results found.  Recent Labs: Lab Results  Component Value Date   WBC 8.2 12/27/2019   HGB 13.7 12/27/2019   PLT 326 12/27/2019   NA 139 12/27/2019   K 4.5 12/27/2019   CL 105 12/27/2019   CO2 30 12/27/2019   GLUCOSE 83 12/27/2019   BUN 3 (L) 12/27/2019   CREATININE 0.61 12/27/2019   BILITOT 0.6 12/27/2019   ALKPHOS 83 08/30/2017   AST 15 12/27/2019   ALT 11 12/27/2019   PROT 6.8 12/27/2019   ALBUMIN 4.5 08/30/2017   CALCIUM 9.7 12/27/2019   GFRAA 109 12/27/2019   QFTBGOLD Negative 08/02/2015   QFTBGOLDPLUS NEGATIVE 12/27/2019    Speciality Comments: Prior therapy: Remicade and Humira (inadequate response) Osteoporosis managed by PCP. Last DXA August 2019.  Procedures:  No procedures performed Allergies: Codeine, Other, Penicillins, and Gluten meal   Assessment / Plan:     Visit Diagnoses: Psoriatic arthritis (HCC)-she did not have synovitis on examination.  She has synovial thickening over PIP and DIP joints.  Overall her psoriatic arthritis is quite well controlled on Cosentyx and and 50mg  twice a month and methotrexate 1 mL subcu weekly.  Her labs have been stable.  Psoriasis-she has not had any recent psoriasis flares.  High risk medication use - Cosentyx 150 mg every 14 days (started September 2019), methotrexate 1 mL every 7 days, and folic acid 1 mg 2 tablets daily. Inadequate response to remicade in the past- Plan: CBC with Differential/Platelet, COMPLETE METABOLIC PANEL WITH GFR today and then every 3 months to monitor for drug toxicity.  Her TB gold was negative in December 2021.  Side effects of Cosentyx and methotrexate were reviewed.  She is advised to discontinue medication in case she develops any infection and to resume when the infection  resolves.  She has had COVID-19 vaccination and also the third dose.  A fourth dose was recommended 6 months after the third dose.  Sicca complex ([place]).  Over-the-counter products were discussed.-  Primary osteoarthritis of both hands-she continues to have  some stiffness in her hands.  Trochanteric bursitis, left hip-she has off-and-on discomfort.  Stretching exercises were emphasized.  DDD (degenerative disc disease), lumbar-she has occasional discomfort.  Age-related osteoporosis without current pathological fracture - on Tymlos prescribed by Dr. Redmond School.  Other medical problems are listed as follows:  History of hypothyroidism  History of gastroesophageal reflux (GERD)  History of hypertension-blood pressure is elevated today.  Patient states she missed her blood pressure medications today.  Increased risk of heart disease with psoriatic arthritis was discussed.  Dietary modifications and the need for exercise was placed in the AVS.  History of colonic polyps  Smoker-smoking cessation was emphasized.  Orders: Orders Placed This Encounter  Procedures   CBC with Differential/Platelet   COMPLETE METABOLIC PANEL WITH GFR   No orders of the defined types were placed in this encounter.     Follow-Up Instructions: Return in about 5 months (around 09/02/2020) for Psoriatic arthritis.   Bo Merino, MD  Note - This record has been created using Editor, commissioning.  Chart creation errors have been sought, but may not always  have been located. Such creation errors do not reflect on  the standard of medical care.

## 2020-03-25 ENCOUNTER — Other Ambulatory Visit: Payer: Self-pay | Admitting: Family Medicine

## 2020-04-02 ENCOUNTER — Encounter: Payer: Self-pay | Admitting: Rheumatology

## 2020-04-02 ENCOUNTER — Ambulatory Visit: Payer: Medicare Other | Admitting: Rheumatology

## 2020-04-02 ENCOUNTER — Other Ambulatory Visit: Payer: Self-pay | Admitting: Family Medicine

## 2020-04-02 ENCOUNTER — Other Ambulatory Visit: Payer: Self-pay

## 2020-04-02 VITALS — BP 151/79 | HR 82 | Resp 14 | Ht 66.0 in | Wt 111.0 lb

## 2020-04-02 DIAGNOSIS — M5136 Other intervertebral disc degeneration, lumbar region: Secondary | ICD-10-CM

## 2020-04-02 DIAGNOSIS — M19042 Primary osteoarthritis, left hand: Secondary | ICD-10-CM

## 2020-04-02 DIAGNOSIS — L4050 Arthropathic psoriasis, unspecified: Secondary | ICD-10-CM | POA: Diagnosis not present

## 2020-04-02 DIAGNOSIS — Z8639 Personal history of other endocrine, nutritional and metabolic disease: Secondary | ICD-10-CM | POA: Diagnosis not present

## 2020-04-02 DIAGNOSIS — M7062 Trochanteric bursitis, left hip: Secondary | ICD-10-CM | POA: Diagnosis not present

## 2020-04-02 DIAGNOSIS — L409 Psoriasis, unspecified: Secondary | ICD-10-CM

## 2020-04-02 DIAGNOSIS — M810 Age-related osteoporosis without current pathological fracture: Secondary | ICD-10-CM

## 2020-04-02 DIAGNOSIS — Z8679 Personal history of other diseases of the circulatory system: Secondary | ICD-10-CM | POA: Diagnosis not present

## 2020-04-02 DIAGNOSIS — F17200 Nicotine dependence, unspecified, uncomplicated: Secondary | ICD-10-CM

## 2020-04-02 DIAGNOSIS — M19041 Primary osteoarthritis, right hand: Secondary | ICD-10-CM

## 2020-04-02 DIAGNOSIS — Z8719 Personal history of other diseases of the digestive system: Secondary | ICD-10-CM

## 2020-04-02 DIAGNOSIS — Z86010 Personal history of colonic polyps: Secondary | ICD-10-CM

## 2020-04-02 DIAGNOSIS — Z79899 Other long term (current) drug therapy: Secondary | ICD-10-CM

## 2020-04-02 DIAGNOSIS — I10 Essential (primary) hypertension: Secondary | ICD-10-CM

## 2020-04-02 DIAGNOSIS — M3500 Sicca syndrome, unspecified: Secondary | ICD-10-CM | POA: Diagnosis not present

## 2020-04-02 NOTE — Patient Instructions (Signed)
Standing Labs We placed an order today for your standing lab work.   Please have your standing labs drawn in June and every 84months  If possible, please have your labs drawn 2 weeks prior to your appointment so that the provider can discuss your results at your appointment.  We have open lab daily Monday through Thursday from 1:30-4:30 PM and Friday from 1:30-4:00 PM at the office of Dr. Bo Merino, [facility].   Please be advised, all patients with office appointments requiring lab work will take precedents over walk-in lab work.  If possible, please come for your lab work on Monday and Friday afternoons, as you may experience shorter wait times. The office is located at [address], [place], [place], [place] [address] No appointment is necessary.   Labs are drawn by [facility]. Please bring your co-pay at the time of your lab draw.  You may receive a bill from [facility] for your lab work.  If you wish to have your labs drawn at another location, please call the office 24 hours in advance to send orders.  If you have any questions regarding directions or hours of operation,  please call [phone].   As a reminder, please drink plenty of water prior to coming for your lab work. Thanks!   COVID-19 vaccine recommendations:   COVID-19 vaccine is recommended for everyone (unless you are allergic to a vaccine component), even if you are on a medication that suppresses your immune system.   If you are on Methotrexate, Cellcept (mycophenolate), Rinvoq, Morrie Sheldon, and Olumiant- hold the medication for 1 week after each vaccine. Hold Methotrexate for 2 weeks after the single dose COVID-19 vaccine.  The recommendations are that patients on immunosuppressive medications should get first 3 doses of COVID-19 vaccine 1 month apart and then a fourth dose (booster) 6 months after the third dose.   Do not take Tylenol or any anti-inflammatory medications (NSAIDs) 24 hours prior to the  COVID-19 vaccination.   There is no direct evidence about the efficacy of the COVID-19 vaccine in individuals who are on medications that suppress the immune system.   Even if you are fully vaccinated, and you are on any medications that suppress your immune system, please continue to wear a mask, maintain at least six feet social distance and practice hand hygiene.   If you develop a COVID-19 infection, please contact your PCP or our office to determine if you need monoclonal antibody infusion.  The booster vaccine is now available for immunocompromised patients.   Please see the following web sites for updated information.   [email or web address]://[email or web address]/[email or web address]-Vaccination-Patient-Resources.[email or web address]  Heart Disease Prevention   Your inflammatory disease increases your risk of heart disease which includes heart attack, stroke, atrial fibrillation (irregular heartbeats), high blood pressure, heart failure and atherosclerosis (plaque in the arteries).  It is important to reduce your risk by:   . Keep blood pressure, cholesterol, and blood sugar at healthy levels   . Smoking Cessation   . Maintain a healthy weight  o BMI 20-25   . Eat a healthy diet  o Plenty of fresh fruit, vegetables, and whole grains  o Limit saturated fats, foods high in sodium, and added sugars  o DASH and Mediterranean diet   . Increase physical activity  o Recommend moderate physically activity for 150 minutes per week/ 30 minutes a day for five days a week These can be broken up into three separate ten-minute sessions during the day.   . Reduce Stress  .  Meditation, slow breathing exercises, yoga, coloring books  . Dental visits twice a year

## 2020-04-03 LAB — COMPLETE METABOLIC PANEL WITH GFR
AG Ratio: 1.5 (calc) (ref 1.0–2.5)
ALT: 13 U/L (ref 6–29)
AST: 16 U/L (ref 10–35)
Albumin: 4 g/dL (ref 3.6–5.1)
Alkaline phosphatase (APISO): 77 U/L (ref 37–153)
BUN: 7 mg/dL (ref 7–25)
CO2: 28 mmol/L (ref 20–32)
Calcium: 9.8 mg/dL (ref 8.6–10.4)
Chloride: 108 mmol/L (ref 98–110)
Creat: 0.55 mg/dL (ref 0.50–0.99)
GFR, Est African American: 113 mL/min/{1.73_m2} (ref >=60)
GFR, Est Non African American: 98 mL/min/{1.73_m2} (ref >=60)
Globulin: 2.7 g/dL (calc) (ref 1.9–3.7)
Glucose, Bld: 78 mg/dL (ref 65–99)
Potassium: 4.3 mmol/L (ref 3.5–5.3)
Sodium: 142 mmol/L (ref 135–146)
Total Bilirubin: 0.4 mg/dL (ref 0.2–1.2)
Total Protein: 6.7 g/dL (ref 6.1–8.1)

## 2020-04-03 LAB — CBC WITH DIFFERENTIAL/PLATELET
Absolute Monocytes: 473 cells/uL (ref 200–950)
Basophils Absolute: 9 cells/uL (ref 0–200)
Basophils Relative: 0.1 %
Eosinophils Absolute: 73 cells/uL (ref 15–500)
Eosinophils Relative: 0.8 %
HCT: 37.5 % (ref 35.0–45.0)
Hemoglobin: 12.4 g/dL (ref 11.7–15.5)
Lymphs Abs: 2885 cells/uL (ref 850–3900)
MCH: 32.1 pg (ref 27.0–33.0)
MCHC: 33.1 g/dL (ref 32.0–36.0)
MCV: 97.2 fL (ref 80.0–100.0)
MPV: 10 fL (ref 7.5–12.5)
Monocytes Relative: 5.2 %
Neutro Abs: 5660 cells/uL (ref 1500–7800)
Neutrophils Relative %: 62.2 %
Platelets: 333 10*3/uL (ref 140–400)
RBC: 3.86 10*6/uL (ref 3.80–5.10)
RDW: 12.6 % (ref 11.0–15.0)
Total Lymphocyte: 31.7 %
WBC: 9.1 10*3/uL (ref 3.8–10.8)

## 2020-04-03 NOTE — Progress Notes (Signed)
CBC and CMP are normal.

## 2020-04-08 ENCOUNTER — Telehealth: Payer: Self-pay

## 2020-04-08 NOTE — Telephone Encounter (Signed)
Pt. Called stating she recently saw her Rheumatologist Dr. Katherina Right and she told her she should get a second covid booster and gave her the ok to get it. She said she got her first booster on 10/02/19 so it has been 6 months. She wanted to know if she could get it here, she tried to get it at the pharmacy but they wouldn't allow her to.

## 2020-04-08 NOTE — Telephone Encounter (Signed)
Right now the standard is 1 booster.  The FDA has not officially approved the fourth shot.

## 2020-04-09 NOTE — Telephone Encounter (Signed)
Pt aware.

## 2020-04-12 DIAGNOSIS — H25013 Cortical age-related cataract, bilateral: Secondary | ICD-10-CM | POA: Diagnosis not present

## 2020-04-12 DIAGNOSIS — H04123 Dry eye syndrome of bilateral lacrimal glands: Secondary | ICD-10-CM | POA: Diagnosis not present

## 2020-04-12 DIAGNOSIS — H35033 Hypertensive retinopathy, bilateral: Secondary | ICD-10-CM | POA: Diagnosis not present

## 2020-04-12 DIAGNOSIS — H2513 Age-related nuclear cataract, bilateral: Secondary | ICD-10-CM | POA: Diagnosis not present

## 2020-04-12 DIAGNOSIS — H524 Presbyopia: Secondary | ICD-10-CM | POA: Diagnosis not present

## 2020-04-12 DIAGNOSIS — H35363 Drusen (degenerative) of macula, bilateral: Secondary | ICD-10-CM | POA: Diagnosis not present

## 2020-04-12 LAB — HM DIABETES EYE EXAM

## 2020-04-18 ENCOUNTER — Encounter: Payer: Self-pay | Admitting: Family Medicine

## 2020-04-18 DIAGNOSIS — H269 Unspecified cataract: Secondary | ICD-10-CM | POA: Insufficient documentation

## 2020-04-18 DIAGNOSIS — H25013 Cortical age-related cataract, bilateral: Secondary | ICD-10-CM

## 2020-04-19 ENCOUNTER — Encounter: Payer: Self-pay | Admitting: Family Medicine

## 2020-05-03 DIAGNOSIS — H35033 Hypertensive retinopathy, bilateral: Secondary | ICD-10-CM | POA: Diagnosis not present

## 2020-05-03 DIAGNOSIS — H2512 Age-related nuclear cataract, left eye: Secondary | ICD-10-CM | POA: Diagnosis not present

## 2020-05-03 DIAGNOSIS — H25013 Cortical age-related cataract, bilateral: Secondary | ICD-10-CM | POA: Diagnosis not present

## 2020-05-03 DIAGNOSIS — H2513 Age-related nuclear cataract, bilateral: Secondary | ICD-10-CM | POA: Diagnosis not present

## 2020-05-27 ENCOUNTER — Encounter: Payer: Self-pay | Admitting: Family Medicine

## 2020-05-27 ENCOUNTER — Ambulatory Visit (INDEPENDENT_AMBULATORY_CARE_PROVIDER_SITE_OTHER): Payer: Medicare Other | Admitting: Family Medicine

## 2020-05-27 ENCOUNTER — Other Ambulatory Visit: Payer: Self-pay

## 2020-05-27 VITALS — BP 132/82 | HR 74 | Temp 97.6°F | Ht 66.0 in | Wt 109.2 lb

## 2020-05-27 DIAGNOSIS — L723 Sebaceous cyst: Secondary | ICD-10-CM | POA: Diagnosis not present

## 2020-05-27 DIAGNOSIS — L089 Local infection of the skin and subcutaneous tissue, unspecified: Secondary | ICD-10-CM

## 2020-05-27 MED ORDER — LIDOCAINE-EPINEPHRINE (PF) 1 %-1:200000 IJ SOLN
10.0000 mL | Freq: Once | INTRAMUSCULAR | Status: AC
  Administered 2020-05-27: 10 mL via INTRADERMAL

## 2020-05-27 NOTE — Progress Notes (Signed)
   Subjective:    Patient ID: April Cox, female    DOB: October 18, 1955, 65 y.o.   MRN: [id number]  HPI She is here for evaluation of a lesion on the left upper back that has gotten more red swollen and tender over the last several days.   Review of Systems     Objective:   Physical Exam Exam of the back shows a 4 cm round red tender lesion with central fluctuation.       Assessment & Plan:  Infected sebaceous cyst The lesion was injected with Xylocaine and epinephrine.  A 2 cm incision was made.  Purulent material was expressed as well as cyst sac.  The wound had to be manually cleaned of the cyst sac as it was stuck to the wall.  The wound appeared clear and iodoform gauze was placed in there.  She will return here in 2 days.  Explained the fact that this and can recur in spite of no evidence of retained cyst sac.

## 2020-05-29 ENCOUNTER — Encounter: Payer: Self-pay | Admitting: Medical

## 2020-05-29 ENCOUNTER — Ambulatory Visit: Payer: Medicare Other | Admitting: Medical

## 2020-05-29 VITALS — BP 156/82 | HR 65 | Ht 66.0 in | Wt 108.8 lb

## 2020-05-29 DIAGNOSIS — L089 Local infection of the skin and subcutaneous tissue, unspecified: Secondary | ICD-10-CM

## 2020-05-29 DIAGNOSIS — Z5189 Encounter for other specified aftercare: Secondary | ICD-10-CM

## 2020-05-29 DIAGNOSIS — L723 Sebaceous cyst: Secondary | ICD-10-CM

## 2020-05-29 MED ORDER — HYDROCODONE-ACETAMINOPHEN 5-325 MG PO TABS: 1.0000 | ORAL_TABLET | Freq: Four times a day (QID) | ORAL | 0 refills | Status: DC | PRN

## 2020-05-29 MED ORDER — CLINDAMYCIN HCL 300 MG PO CAPS: 300.0000 mg | ORAL_CAPSULE | Freq: Three times a day (TID) | ORAL | 0 refills | Status: DC

## 2020-05-29 NOTE — Progress Notes (Signed)
   Subjective:    Patient ID: April Cox, female    DOB: 1955/09/06, 65 y.o.   MRN: [id number]  HPI Here for packing removal.  She came in 2 days ago to see Dr. Redmond School for wound on her left upper back.  Incision and drainage was made, packing was placed.    She denies fever, redness, pain.  No other complaint.  Review of Systems As in subjective     Objective:   Physical Exam   Gen: wd, wn, nad Skin: left upper back with 3cm somewhat erythematous raised, quite tender area with surgical wound and packing in place.  No fluctuance, no warmth.   Slight induration      Assessment & Plan:   Cleaned and prepped wound.   Removed packing.   She declines additional packing.   We discussed wound care, flushing with saline.   Given her level of pain, add medicaiton below.     Clindamycin chosen due to PCN allergy and interactions of bactrim with methotrexate.     Discussed risks/benefits of medicaiton.     F/u if not much improved in the next week

## 2020-06-06 ENCOUNTER — Other Ambulatory Visit: Payer: Self-pay

## 2020-06-06 ENCOUNTER — Telehealth: Payer: Self-pay | Admitting: Family Medicine

## 2020-06-06 DIAGNOSIS — I10 Essential (primary) hypertension: Secondary | ICD-10-CM

## 2020-06-06 MED ORDER — METOPROLOL SUCCINATE ER 50 MG PO TB24: ORAL_TABLET | ORAL | 1 refills | Status: DC

## 2020-06-06 NOTE — Telephone Encounter (Signed)
Done Kh 

## 2020-06-06 NOTE — Telephone Encounter (Signed)
Pharmacy send refill and is requesting a refill on metoprolol please send to the [facility] [address], [place] [address]

## 2020-06-12 DIAGNOSIS — H2511 Age-related nuclear cataract, right eye: Secondary | ICD-10-CM | POA: Diagnosis not present

## 2020-06-12 DIAGNOSIS — H25811 Combined forms of age-related cataract, right eye: Secondary | ICD-10-CM | POA: Diagnosis not present

## 2020-06-18 DIAGNOSIS — H25012 Cortical age-related cataract, left eye: Secondary | ICD-10-CM | POA: Diagnosis not present

## 2020-06-18 DIAGNOSIS — H2512 Age-related nuclear cataract, left eye: Secondary | ICD-10-CM | POA: Diagnosis not present

## 2020-06-26 DIAGNOSIS — H2512 Age-related nuclear cataract, left eye: Secondary | ICD-10-CM | POA: Diagnosis not present

## 2020-06-26 DIAGNOSIS — H25012 Cortical age-related cataract, left eye: Secondary | ICD-10-CM | POA: Diagnosis not present

## 2020-06-28 ENCOUNTER — Ambulatory Visit (INDEPENDENT_AMBULATORY_CARE_PROVIDER_SITE_OTHER): Payer: Medicare Other | Admitting: Family Medicine

## 2020-06-28 ENCOUNTER — Encounter: Payer: Self-pay | Admitting: Family Medicine

## 2020-06-28 VITALS — BP 142/88 | HR 95 | Temp 98.6°F | Ht 65.0 in | Wt 103.2 lb

## 2020-06-28 DIAGNOSIS — I10 Essential (primary) hypertension: Secondary | ICD-10-CM

## 2020-06-28 DIAGNOSIS — Z79899 Other long term (current) drug therapy: Secondary | ICD-10-CM

## 2020-06-28 DIAGNOSIS — Z9849 Cataract extraction status, unspecified eye: Secondary | ICD-10-CM

## 2020-06-28 DIAGNOSIS — K219 Gastro-esophageal reflux disease without esophagitis: Secondary | ICD-10-CM | POA: Diagnosis not present

## 2020-06-28 DIAGNOSIS — H04129 Dry eye syndrome of unspecified lacrimal gland: Secondary | ICD-10-CM | POA: Diagnosis not present

## 2020-06-28 DIAGNOSIS — M3500 Sicca syndrome, unspecified: Secondary | ICD-10-CM | POA: Diagnosis not present

## 2020-06-28 DIAGNOSIS — M5136 Other intervertebral disc degeneration, lumbar region: Secondary | ICD-10-CM | POA: Diagnosis not present

## 2020-06-28 DIAGNOSIS — M810 Age-related osteoporosis without current pathological fracture: Secondary | ICD-10-CM | POA: Diagnosis not present

## 2020-06-28 DIAGNOSIS — E038 Other specified hypothyroidism: Secondary | ICD-10-CM | POA: Diagnosis not present

## 2020-06-28 DIAGNOSIS — F17210 Nicotine dependence, cigarettes, uncomplicated: Secondary | ICD-10-CM

## 2020-06-28 DIAGNOSIS — E785 Hyperlipidemia, unspecified: Secondary | ICD-10-CM | POA: Diagnosis not present

## 2020-06-28 DIAGNOSIS — M47816 Spondylosis without myelopathy or radiculopathy, lumbar region: Secondary | ICD-10-CM

## 2020-06-28 DIAGNOSIS — L4050 Arthropathic psoriasis, unspecified: Secondary | ICD-10-CM | POA: Diagnosis not present

## 2020-06-28 DIAGNOSIS — Z86010 Personal history of colonic polyps: Secondary | ICD-10-CM

## 2020-06-28 DIAGNOSIS — Z0000 Encounter for general adult medical examination without abnormal findings: Secondary | ICD-10-CM

## 2020-06-28 NOTE — Progress Notes (Signed)
April Cox is a 65 y.o. female who presents for her welcome to Medicare visit and follow-up on chronic medical conditions.  She is presently being treated for psoriatic arthritis as well as probable Sjogren's syndrome.  She is followed by rheumatology for this.  She seems to be doing well with that.  She does have a history of tubular colonic polyps and is seeing GI for this.  She does have a history of osteopenia but has had fractures in her gynecologist have placed her on Tymlos.  She is due for follow-up DEXA scan.  She has been using vitamin D and calcium supplementation.  She continues on metoprolol for her blood pressure.  She does have some difficulty with reflux and does use Protonix on an as-needed basis.  Continues on atorvastatin without trouble.  She also has hypothyroidism and is on Synthroid.  She smokes but is not interested in quitting.  Immunizations and Health Maintenance Immunization History  Administered Date(s) Administered   Fluad Quad(high Dose 65+) 10/11/2018   Influenza Split 12/19/2010, 12/01/2011   Influenza,inj,Quad PF,6+ Mos 01/21/2016   Influenza,inj,quad, With Preservative 12/04/2017   PFIZER(Purple Top)SARS-COV-2 Vaccination 02/27/2019, 03/24/2019, 10/02/2019   Pneumococcal Conjugate-13 01/20/2014   Pneumococcal Polysaccharide-23 08/01/2010   Tdap 01/21/2016, 09/15/2017   Zoster Recombinat (Shingrix) 12/11/2017, 02/27/2018   Health Maintenance Due  Topic Date Due   COVID-19 Vaccine (4 - Booster for Pfizer series) 01/01/2020    Last Pap smear: Hysterectomy 10/26/14 Last mammogram: 09/20/2018 Last colonoscopy:08/21/19 Last DEXA: 09/20/18 Dentist: Q six months Ophtho: Q year Exercise: walking 20 a day  Other doctors caring for patient include: Dr.Deveshwar rheumatology, Dr. Havery [facility]os GI, Dr.Weaver eye  Advanced directives: Does Patient Have a Medical Advance Directive?: Yes Type of Advance Directive: Living will, Healthcare Power of Attorney Does patient  want to make changes to medical advance directive?: No - Patient declined Copy of [place] in Chart?: Yes - validated most recent copy scanned in chart (See row information)  Depression screen:  See questionnaire below.  Depression screen [facility] 2/9 06/28/2020 05/27/2020 06/28/2019 09/09/2016 09/10/2014  Decreased Interest 0 0 0 0 0  Down, Depressed, Hopeless 0 0 0 0 0  PHQ - 2 Score 0 0 0 0 0  Some recent data might be hidden    Fall Risk Screen: see questionnaire below. Fall Risk  06/28/2020 05/27/2020 09/09/2016 09/10/2014  Falls in the past year? 0 0 No No  Number falls in past yr: 0 0 - -  Injury with Fall? 0 0 - -  Risk for fall due to : No Fall Risks No Fall Risks - -  Follow up Falls evaluation completed Falls evaluation completed - -    ADL screen:  See questionnaire below Functional Status Survey: Is the patient deaf or have difficulty hearing?: No Does the patient have difficulty seeing, even when wearing glasses/contacts?: No Does the patient have difficulty concentrating, remembering, or making decisions?: No Does the patient have difficulty walking or climbing stairs?: No Does the patient have difficulty dressing or bathing?: No Does the patient have difficulty doing errands alone such as visiting a doctor's office or shopping?: No   Review of Systems Constitutional: -, -unexpected weight change, -anorexia, -fatigue Allergy: -sneezing, -itching, -congestion Dermatology: denies changing moles, rash, lumps ENT: -runny nose, -ear pain, -sore throat,  Cardiology:  -chest pain, -palpitations, -orthopnea, Respiratory: -cough, -shortness of breath, -dyspnea on exertion, -wheezing,  Gastroenterology: -abdominal pain, -nausea, -vomiting, -diarrhea, -constipation, -dysphagia Hematology: -bleeding or bruising problems  Musculoskeletal: -arthralgias, -myalgias, -joint swelling, -back pain, - Ophthalmology: -vision changes,  Urology: -dysuria, -difficulty urinating,   -urinary frequency, -urgency, incontinence Neurology: -, -numbness, , -memory loss, -falls, -dizziness    PHYSICAL EXAM:   General Appearance: Alert, cooperative, no distress, appears stated age Head: Normocephalic, without obvious abnormality, atraumatic Eyes: PERRL, conjunctiva/corneas clear, EOM's intact,  Ears: Normal TM's and external ear canals Nose: Nares normal, mucosa normal, no drainage or sinus tenderness Throat: Lips, mucosa, and tongue normal; teeth and gums normal Neck: Supple, no lymphadenopathy;  thyroid:  no enlargement/tenderness/nodules; no carotid bruit or JVD Lungs: Clear to auscultation bilaterally without wheezes, rales or ronchi; respirations unlabored Heart: Regular rate and rhythm, S1 and S2 normal, no murmur, rubor gallop Abdomen: Soft, non-tender, nondistended, normoactive bowel sounds,  no masses, no hepatosplenomegaly Extremities: No clubbing, cyanosis or edema Pulses: 2+ and symmetric all extremities Skin:  Skin color, texture, turgor normal, no rashes or lesions Lymph nodes: Cervical, supraclavicular, and axillary nodes normal Neurologic:  CNII-XII intact, normal strength, sensation and gait; reflexes 2+ and symmetric throughout Psych: Normal mood, affect, hygiene and grooming.  ASSESSMENT/PLAN: Hyperlipidemia, unspecified hyperlipidemia type - Plan: Lipid panel  Other specified hypothyroidism - Plan: TSH  Psoriatic arthritis ([place])  Cigarette smoker  Essential hypertension  History of colonic polyps  Age-related osteoporosis without current pathological fracture  Gastroesophageal reflux disease without esophagitis  Dry eye  Sicca complex (HCC)  DDD (degenerative disc disease), lumbar  High risk medication use  Spondylosis of lumbar region without myelopathy or radiculopathy  History of cataract extraction, unspecified laterality  Discussed monthly self breast exams and yearly mammograms; at least 30 minutes of aerobic activity at  least 5 days/week and weight-bearing exercise 2x/week; p healthy diet, including goals of calcium and vitamin D intake.  Immunization recommendations discussed.  Colonoscopy recommendations reviewed.  Discussed returning for her gynecologist since she has had a hysterectomy.  She needs to discuss the mammogram and follow-up DEXA scan symptoms now time for that   Medicare Attestation I have personally reviewed: The patient's medical and social history Their use of alcohol, tobacco or illicit drugs Their current medications and supplements The patient's functional ability including ADLs,fall risks, home safety risks, cognitive, and hearing and visual impairment Diet and physical activities Evidence for depression or mood disorders  The patient's weight, height, and BMI have been recorded in the chart.  I have made referrals, counseling, and provided education to the patient based on review of the above and I have provided the patient with a written personalized care plan for preventive services.     Jill Alexanders, MD   06/28/2020

## 2020-06-29 LAB — LIPID PANEL
Chol/HDL Ratio: 4.2 ratio (ref 0.0–4.4)
Cholesterol, Total: 251 mg/dL — ABNORMAL HIGH (ref 100–199)
HDL: 60 mg/dL (ref >39)
LDL Chol Calc (NIH): 168 mg/dL — ABNORMAL HIGH (ref 0–99)
Triglycerides: 128 mg/dL (ref 0–149)
VLDL Cholesterol Cal: 23 mg/dL (ref 5–40)

## 2020-06-29 LAB — TSH: TSH: 1.11 u[IU]/mL (ref 0.450–4.500)

## 2020-07-11 ENCOUNTER — Encounter: Payer: Self-pay | Admitting: Family Medicine

## 2020-07-12 ENCOUNTER — Other Ambulatory Visit: Payer: Self-pay | Admitting: Family Medicine

## 2020-08-03 ENCOUNTER — Other Ambulatory Visit: Payer: Self-pay | Admitting: Family Medicine

## 2020-08-03 DIAGNOSIS — I10 Essential (primary) hypertension: Secondary | ICD-10-CM

## 2020-08-30 NOTE — Progress Notes (Signed)
Office Visit Note  Patient: April Cox             Date of Birth: 17-Nov-1955           MRN: [id number]             PCP: Denita Lung, MD Referring: Denita Lung, MD Visit Date: 09/12/2020 Occupation: '@GUAROCC'$ @  Subjective:  Medication monitoring   History of Present Illness: April Cox is a 65 y.o. female with history of psoriatic arthritis, osteoarthritis, and DDD.  She is prescribed cosentyx 150 mg sq injections every 14 days, methotrexate 1.0 ml sq injections once weekly, and folic acid 2 mg daily.  On Friday she started to experience symptoms of UTI so she hold her methotrexate and Cosentyx dose on Saturday.  She is currently taking Bactrim as prescribed and her symptoms have resolved.  She plans on restarting her medications once she has completed the antibiotics.  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  She states that she has had some increased discomfort in her hands which she attributes to please overuse activities.  She denies any increased joint swelling at this time.  She states she continues to have chronic pain in the left SI joint.  She denies any Achilles tendinitis or plantar fasciitis at this time.  She has no active psoriasis currently.  Overall she continues to find combination therapy to be effective at managing her arthritis. She remains on Tymlos and is taking a calcium and vitamin D supplement on a daily basis as recommended.  She denies any recent fractures.  She will be due to update a DEXA in September 2022.    Activities of Daily Living:  Patient reports morning stiffness for 10-60 minutes.   Patient Denies nocturnal pain.  Difficulty dressing/grooming: Denies Difficulty climbing stairs: Denies Difficulty getting out of chair: Denies Difficulty using hands for taps, buttons, cutlery, and/or writing: Reports  Review of Systems  Constitutional:  Negative for fatigue.  HENT:   Positive for mouth dryness. Negative for mouth sores and nose dryness.   Eyes:  Positive for dryness. Negative for pain and itching.  Respiratory:  Negative for shortness of breath and difficulty breathing.   Cardiovascular:  Negative for chest pain and palpitations.  Gastrointestinal:  Negative for blood in stool, constipation and diarrhea.  Endocrine: Positive for increased urination.  Genitourinary:  Positive for difficulty urinating.  Musculoskeletal:  Positive for joint pain, joint pain and morning stiffness. Negative for joint swelling, myalgias, muscle tenderness and myalgias.  Skin:  Negative for color change, rash and redness.  Allergic/Immunologic: Negative for susceptible to infections.  Neurological:  Negative for dizziness, numbness, headaches, memory loss and weakness.  Hematological:  Positive for bruising/bleeding tendency.  Psychiatric/Behavioral:  Negative for confusion.    PMFS History:  Patient Active Problem List   Diagnosis Date Noted   Sicca complex ([place]) 04/01/2017   DDD (degenerative disc disease), lumbar 04/01/2017   Psoriasis 04/01/2017   Dry eye 01/01/2017   Spondylosis of lumbar region without myelopathy or radiculopathy 01/07/2016   High risk medication use 01/07/2016   Essential hypertension 09/10/2014   History of HPV infection 09/10/2014   History of colonic polyps 09/10/2014   Osteoporosis 09/10/2014   Gastroesophageal reflux disease without esophagitis 09/10/2014   Cigarette smoker 05/11/2011   Hyperlipidemia 08/22/2010   Hypothyroidism 08/22/2010   Psoriatic arthritis ([place]) 08/22/2010    Past Medical  History:  Diagnosis Date   Abnormal Pap smear of cervix    Anemia    history of anemia   Arthritis    Cataract    Diverticulosis of colon    Family history of adverse reaction to anesthesia    mother PONV   GERD (gastroesophageal reflux disease)    Heart murmur    asymptomatic per pt   History of adenomatous polyp of colon    11-06-2009   tubullar   HPV in female    Hyperlipidemia    Hypertension    Hypothyroidism    Mild aortic valve stenosis    per echo 10-09-2010  ef 60-65%  valva area 1.48cm   Osteoporosis    Psoriasis    Psoriatic arthritis ([place])    Dr. Estanislado Pandy   Wears glasses     Family History  Problem Relation Age of Onset   Arthritis Mother        OA    Psoriasis Mother    Other Father        accidental death/MVA at age 26   Hypertension Brother    Other Brother        1 brother drowned   Cancer Brother    Hypertension Maternal Grandmother    Diabetes Maternal Grandmother    Hypertension Maternal Grandfather    Heart disease Neg Hx    Colon cancer Neg Hx    Colon polyps Neg Hx    Stomach cancer Neg Hx    Esophageal cancer Neg Hx    Past Surgical History:  Procedure Laterality Date   ABDOMINAL HYSTERECTOMY     ANTERIOR LAT LUMBAR FUSION  12/25/2011   Procedure: ANTERIOR LATERAL LUMBAR FUSION 1 LEVEL;  Surgeon: Eustace Moore, MD;  Location: [place] [facility];  Service: Neurosurgery;  Laterality: Left;  LUMBAR FOUR-FIVE   BLADDER SUSPENSION N/A 12/19/2014   Procedure: TRANSVAGINAL TAPE (TVT) PROCEDURE;  Surgeon: Everett Graff, MD;  Location: [place] [facility];  Service: Gynecology;  Laterality: N/A;   CARPAL TUNNEL RELEASE Right 04/2013   CATARACT EXTRACTION, BILATERAL     05/2020, 06/2020   CERVICAL CONIZATION W/BX N/A 08/28/2014   Procedure: CONIZATION CERVIX WITH BIOPSY;  Surgeon: Ena Dawley, MD;  Location: [facility];  Service: Gynecology;  Laterality: N/A;   COLONOSCOPY  11/06/2009   COLONOSCOPY  08/2019   CYSTO N/A 12/19/2014   Procedure: Kathrene Alu;  Surgeon: Everett Graff, MD;  Location: [facility];  Service: Gynecology;  Laterality: N/A;   DENTAL SURGERY     HYSTEROSCOPY WITH D & C  04/04/2008   KNEE ARTHROPLASTY     LUMBAR PERCUTANEOUS PEDICLE SCREW 1 LEVEL  12/25/2011   Procedure: LUMBAR PERCUTANEOUS PEDICLE SCREW 1 LEVEL;  Surgeon: Eustace Moore, MD;  Location: [place] [facility];   Service: Neurosurgery;  Laterality: Left;  LUMBAR FOUR-FIVE   PERONEAL NERVE DECOMPRESSION Left 12/27/2009   for neuropathy with  foot drop   POLYPECTOMY     TRANSTHORACIC ECHOCARDIOGRAM  10/09/2010   mild LVH,  ef 60-65%,  grade I diastolic dysfunction,  very mild AV stenosis (area 1.48cm/S/2(VTI) with no regurg./  mild MV calcification without stenosis or regur. /  trivial TR   VAGINAL HYSTERECTOMY Bilateral 12/19/2014   Procedure: TOTAL VAGINAL HYSTERECTOMY WITH BILATERAL SALPINGECTOMY;  Surgeon: Ena Dawley, MD;  Location: [place] [facility];  Service: Gynecology;  Laterality: Bilateral;   Social History   Social History Narrative   Not on file   Immunization History  Administered Date(s) Administered  Fluad Quad(high Dose 65+) 10/11/2018   Influenza Split 12/19/2010, 12/01/2011   Influenza,inj,Quad PF,6+ Mos 01/21/2016   Influenza,inj,quad, With Preservative 12/04/2017   PFIZER(Purple Top)SARS-COV-2 Vaccination 02/27/2019, 03/24/2019, 10/02/2019, 05/23/2020   Pneumococcal Conjugate-13 01/20/2014   Pneumococcal Polysaccharide-23 08/01/2010   Tdap 01/21/2016, 09/15/2017   Zoster Recombinat (Shingrix) 12/11/2017, 02/27/2018     Objective: Vital Signs: BP 134/82 (BP Location: Left Arm, Patient Position: Sitting, Cuff Size: Normal)   Pulse 65   Ht '5\' 6"'$  (1.676 m)   Wt 108 lb (49 kg)   BMI 17.43 kg/m    Physical Exam Vitals and nursing note reviewed.  Constitutional:      Appearance: She is well-developed.  HENT:     Head: Normocephalic and atraumatic.  Eyes:     Conjunctiva/sclera: Conjunctivae normal.  Pulmonary:     Effort: Pulmonary effort is normal.  Abdominal:     Palpations: Abdomen is soft.  Musculoskeletal:     Cervical back: Normal range of motion.  Skin:    General: Skin is warm and dry.     Capillary Refill: Capillary refill takes less than 2 seconds.  Neurological:     Mental Status: She is alert and oriented to person, place, and time.  Psychiatric:         Behavior: Behavior normal.     Musculoskeletal Exam: C-spine, thoracic spine, and lumbar spine have good range of motion with no discomfort.  No midline spinal tenderness.  Tenderness over the left SI joint.  Shoulder joints, elbow joints, wrist joints have good range of motion with no discomfort.  Severe PIP and DIP thickening but no synovitis was noted.  Some tenderness over the right first MCP joint.  Tenderness over bilateral CMC joints.  Complete fist formation bilaterally.  Hip joints have good range of motion with no discomfort.  No tenderness over trochanter bursa bilaterally.  Knee joints have good range of motion with no warmth or effusion.  Ankle joints have good range of motion with no tenderness or joint swelling.  No evidence of Achilles tendinitis.  CDAI Exam: CDAI Score: -- Patient Global: --; Provider Global: -- Swollen: --; Tender: -- Joint Exam 09/12/2020   No joint exam has been documented for this visit   There is currently no information documented on the homunculus. Go to the Rheumatology activity and complete the homunculus joint exam.  Investigation: No additional findings.  Imaging: No results found.  Recent Labs: Lab Results  Component Value Date   WBC 9.1 04/02/2020   HGB 12.4 04/02/2020   PLT 333 04/02/2020   NA 142 04/02/2020   K 4.3 04/02/2020   CL 108 04/02/2020   CO2 28 04/02/2020   GLUCOSE 78 04/02/2020   BUN 7 04/02/2020   CREATININE 0.55 04/02/2020   BILITOT 0.4 04/02/2020   ALKPHOS 83 08/30/2017   AST 16 04/02/2020   ALT 13 04/02/2020   PROT 6.7 04/02/2020   ALBUMIN 4.5 08/30/2017   CALCIUM 9.8 04/02/2020   GFRAA 113 04/02/2020   QFTBGOLD Negative 08/02/2015   QFTBGOLDPLUS NEGATIVE 12/27/2019    Speciality Comments: Prior therapy: Remicade and Humira (inadequate response) Osteoporosis managed by PCP. Last DXA August 2019.  Procedures:  No procedures performed Allergies: Codeine, Other, Penicillins, and Gluten meal     Assessment / Plan:     Visit Diagnoses: Psoriatic arthritis ([place]): She has no synovitis or dactylitis on examination.  She has clinically been doing well on Cosentyx 150 mg sq injections every 14 days, methotrexate 1 ml  sq injections once weekly, and folic acid 2 mg daily.  She held her dose of Cosentyx and methotrexate on Saturday due to experiencing symptoms of UTI.  She is currently taking Bactrim DS for management of UTI.  She plans on resuming both medications when she has completed the course of antibiotics since her symptoms have completely resolved.  She has been experiencing some increased pain and stiffness in both hands but she attributes the increased discomfort due to canning vegetables for the past few weeks.  She has some tenderness over both CMC joints and the right first MCP joint but no inflammation was noted.  She was able to make a complete fist bilaterally.  She continues to have ongoing left SI joint pain but has not noticed any increased nocturnal pain or morning stiffness.  We discussed the importance of regular exercise.  She will remain on combination therapy as prescribed.  She was advised to notify us if she develops increased joint pain or joint swelling.  She will follow-up in the office in 5 months.  Psoriasis: She has no active psoriasis at this time.   High risk medication use - Cosentyx 150 mg every 14 days (started September 2019), methotrexate 1 mL every 7 days, and folic acid 1 mg 2 tablets daily. Inadequate response to remicade   CBC and CMP drawn on 04/02/20.  She is overdue to update lab work.  Orders for CBC and CMP released.  Her next lab work will be due in November and every 3 months.  Standing orders for CBC and CMP are in place.  TB gold negative on 12/27/19. - Plan: CBC with Differential/Platelet, COMPLETE METABOLIC PANEL WITH GFR, QuantiFERON-TB Gold Plus, CBC with Differential/Platelet, COMPLETE METABOLIC PANEL WITH GFR She is currently taking Bactrim DS  for management of UTI.  She held her dose of Cosentyx and methotrexate on Saturday until she was able to be evaluated by her PCP.  She plans on continuing to hold Cosentyx and methotrexate until she completes the course of antibiotics.  Her symptoms have completely resolved.   Screening for tuberculosis -Future order for TB gold placed today.  Plan: QuantiFERON-TB Gold Plus  Sicca complex ([place]): Unchanged   Primary osteoarthritis of both hands: Severe PIP and DIP thickening noted.  Joint protection and muscle strengthening were discussed.   Trochanteric bursitis, left hip: She has occasional tenderness over the left trochanteric bursa.   DDD (degenerative disc disease), lumbar: She has good ROM with no discomfort.  No midline spinal tenderness.    Age-related osteoporosis without current pathological fracture - DEXA on 09/20/2018  1/3 distal radius BMD 0.381 with T-score -5.2.  She is currently on Tymlos prescribed by Dr. Redmond School.  She continues to take a calcium and vitamin D supplement daily.  DEXA due in September 2022-ordered by gynecologist.   Other medical conditions are listed as follows:   History of hypothyroidism  History of hypertension  History of gastroesophageal reflux (GERD)  History of colonic polyps  Smoker   Orders: Orders Placed This Encounter  Procedures   CBC with Differential/Platelet   COMPLETE METABOLIC PANEL WITH GFR   QuantiFERON-TB Gold Plus   CBC with Differential/Platelet   COMPLETE METABOLIC PANEL WITH GFR   No orders of the defined types were placed in this encounter.     Follow-Up Instructions: Return in about 5 months (around 02/12/2021) for Psoriatic arthritis.   Ofilia Neas, PA-C  Note - This record has been created using Dragon software.  Chart  creation errors have been sought, but may not always  have been located. Such creation errors do not reflect on  the standard of medical care.

## 2020-09-03 ENCOUNTER — Ambulatory Visit: Payer: Medicare Other | Admitting: Physician Assistant

## 2020-09-09 ENCOUNTER — Ambulatory Visit (INDEPENDENT_AMBULATORY_CARE_PROVIDER_SITE_OTHER): Payer: Medicare Other | Admitting: Family Medicine

## 2020-09-09 ENCOUNTER — Encounter: Payer: Self-pay | Admitting: Family Medicine

## 2020-09-09 ENCOUNTER — Other Ambulatory Visit: Payer: Self-pay

## 2020-09-09 VITALS — BP 120/82 | HR 76 | Temp 97.8°F | Wt 107.4 lb

## 2020-09-09 DIAGNOSIS — N3001 Acute cystitis with hematuria: Secondary | ICD-10-CM | POA: Diagnosis not present

## 2020-09-09 DIAGNOSIS — L4050 Arthropathic psoriasis, unspecified: Secondary | ICD-10-CM

## 2020-09-09 DIAGNOSIS — R300 Dysuria: Secondary | ICD-10-CM | POA: Diagnosis not present

## 2020-09-09 LAB — POCT URINALYSIS DIP (CLINITEK)
Bilirubin, UA: NEGATIVE
Glucose, UA: NEGATIVE mg/dL
Ketones, POC UA: NEGATIVE mg/dL
Nitrite, UA: NEGATIVE
Spec Grav, UA: 1.015 (ref 1.010–1.025)
Urobilinogen, UA: 0.2 E.U./dL
pH, UA: 6 (ref 5.0–8.0)

## 2020-09-09 MED ORDER — SULFAMETHOXAZOLE-TRIMETHOPRIM 800-160 MG PO TABS: 1.0000 | ORAL_TABLET | Freq: Two times a day (BID) | ORAL | 0 refills | Status: DC

## 2020-09-09 NOTE — Progress Notes (Signed)
   Subjective:    Patient ID: April Cox, female    DOB: 08/23/1955, 65 y.o.   MRN: [id number]  HPI She complains of a several day history of frequency, dysuria and seeing blood in her urine.  No fever or chills.  She has had previous hysterectomy and procedure.     Review of Systems     Objective:   Physical Exam Alert and in no distress otherwise not examined.  Urine dipstick was positive for blood.  Microscopic showed RBCs TNTC       Assessment & Plan:  Acute cystitis with hematuria - Plan: sulfamethoxazole-trimethoprim (BACTRIM DS) 800-160 MG tablet  Burning with urination - Plan: POCT URINALYSIS DIP (CLINITEK)  Psoriatic arthritis ([place]) Cautioned to not take methotrexate while taking the antibiotic.  Return here in 2 weeks for recheck on her urine.

## 2020-09-12 ENCOUNTER — Other Ambulatory Visit: Payer: Self-pay

## 2020-09-12 ENCOUNTER — Ambulatory Visit: Payer: Medicare Other | Admitting: Physician Assistant

## 2020-09-12 ENCOUNTER — Encounter: Payer: Self-pay | Admitting: Physician Assistant

## 2020-09-12 VITALS — BP 134/82 | HR 65 | Ht 66.0 in | Wt 108.0 lb

## 2020-09-12 DIAGNOSIS — M7062 Trochanteric bursitis, left hip: Secondary | ICD-10-CM

## 2020-09-12 DIAGNOSIS — Z8679 Personal history of other diseases of the circulatory system: Secondary | ICD-10-CM | POA: Diagnosis not present

## 2020-09-12 DIAGNOSIS — L4050 Arthropathic psoriasis, unspecified: Secondary | ICD-10-CM

## 2020-09-12 DIAGNOSIS — M3500 Sicca syndrome, unspecified: Secondary | ICD-10-CM

## 2020-09-12 DIAGNOSIS — M5136 Other intervertebral disc degeneration, lumbar region: Secondary | ICD-10-CM

## 2020-09-12 DIAGNOSIS — Z860100 Personal history of colon polyps, unspecified: Secondary | ICD-10-CM

## 2020-09-12 DIAGNOSIS — Z8639 Personal history of other endocrine, nutritional and metabolic disease: Secondary | ICD-10-CM

## 2020-09-12 DIAGNOSIS — Z79899 Other long term (current) drug therapy: Secondary | ICD-10-CM | POA: Diagnosis not present

## 2020-09-12 DIAGNOSIS — L409 Psoriasis, unspecified: Secondary | ICD-10-CM

## 2020-09-12 DIAGNOSIS — Z111 Encounter for screening for respiratory tuberculosis: Secondary | ICD-10-CM

## 2020-09-12 DIAGNOSIS — M810 Age-related osteoporosis without current pathological fracture: Secondary | ICD-10-CM

## 2020-09-12 DIAGNOSIS — Z8719 Personal history of other diseases of the digestive system: Secondary | ICD-10-CM

## 2020-09-12 DIAGNOSIS — M19041 Primary osteoarthritis, right hand: Secondary | ICD-10-CM | POA: Diagnosis not present

## 2020-09-12 DIAGNOSIS — M51369 Other intervertebral disc degeneration, lumbar region without mention of lumbar back pain or lower extremity pain: Secondary | ICD-10-CM

## 2020-09-12 DIAGNOSIS — F17200 Nicotine dependence, unspecified, uncomplicated: Secondary | ICD-10-CM

## 2020-09-12 DIAGNOSIS — M19042 Primary osteoarthritis, left hand: Secondary | ICD-10-CM

## 2020-09-12 LAB — CBC WITH DIFFERENTIAL/PLATELET
Absolute Monocytes: 331 cells/uL (ref 200–950)
Basophils Absolute: 23 cells/uL (ref 0–200)
Basophils Relative: 0.3 %
Eosinophils Absolute: 39 cells/uL (ref 15–500)
Eosinophils Relative: 0.5 %
HCT: 39.7 % (ref 35.0–45.0)
Hemoglobin: 13.2 g/dL (ref 11.7–15.5)
Lymphs Abs: 3165 cells/uL (ref 850–3900)
MCH: 31.5 pg (ref 27.0–33.0)
MCHC: 33.2 g/dL (ref 32.0–36.0)
MCV: 94.7 fL (ref 80.0–100.0)
MPV: 10.2 fL (ref 7.5–12.5)
Monocytes Relative: 4.3 %
Neutro Abs: 4143 cells/uL (ref 1500–7800)
Neutrophils Relative %: 53.8 %
Platelets: 297 10*3/uL (ref 140–400)
RBC: 4.19 10*6/uL (ref 3.80–5.10)
RDW: 12.7 % (ref 11.0–15.0)
Total Lymphocyte: 41.1 %
WBC: 7.7 10*3/uL (ref 3.8–10.8)

## 2020-09-12 LAB — COMPLETE METABOLIC PANEL WITH GFR
AG Ratio: 1.6 (calc) (ref 1.0–2.5)
ALT: 7 U/L (ref 6–29)
AST: 11 U/L (ref 10–35)
Albumin: 4.2 g/dL (ref 3.6–5.1)
Alkaline phosphatase (APISO): 65 U/L (ref 37–153)
BUN/Creatinine Ratio: 6 (calc) (ref 6–22)
BUN: 4 mg/dL — ABNORMAL LOW (ref 7–25)
CO2: 22 mmol/L (ref 20–32)
Calcium: 9.7 mg/dL (ref 8.6–10.4)
Chloride: 105 mmol/L (ref 98–110)
Creat: 0.66 mg/dL (ref 0.50–1.05)
Globulin: 2.6 g/dL (calc) (ref 1.9–3.7)
Glucose, Bld: 85 mg/dL (ref 65–99)
Potassium: 4.1 mmol/L (ref 3.5–5.3)
Sodium: 137 mmol/L (ref 135–146)
Total Bilirubin: 0.4 mg/dL (ref 0.2–1.2)
Total Protein: 6.8 g/dL (ref 6.1–8.1)
eGFR: 96 mL/min/{1.73_m2} (ref >=60)

## 2020-09-12 NOTE — Patient Instructions (Signed)

## 2020-09-13 NOTE — Progress Notes (Signed)
CBC and CMP are normal.

## 2020-09-19 ENCOUNTER — Other Ambulatory Visit: Payer: Medicare Other

## 2020-09-19 ENCOUNTER — Other Ambulatory Visit: Payer: Self-pay

## 2020-10-03 ENCOUNTER — Other Ambulatory Visit: Payer: Self-pay | Admitting: Family Medicine

## 2020-10-03 DIAGNOSIS — I10 Essential (primary) hypertension: Secondary | ICD-10-CM

## 2020-10-23 ENCOUNTER — Other Ambulatory Visit: Payer: Self-pay | Admitting: Family Medicine

## 2020-10-23 ENCOUNTER — Ambulatory Visit (INDEPENDENT_AMBULATORY_CARE_PROVIDER_SITE_OTHER): Payer: Medicare Other

## 2020-10-23 ENCOUNTER — Other Ambulatory Visit: Payer: Self-pay

## 2020-10-23 DIAGNOSIS — Z23 Encounter for immunization: Secondary | ICD-10-CM

## 2020-11-28 ENCOUNTER — Other Ambulatory Visit: Payer: Self-pay | Admitting: Physician Assistant

## 2020-11-28 ENCOUNTER — Other Ambulatory Visit: Payer: Self-pay | Admitting: Family Medicine

## 2020-11-28 DIAGNOSIS — E038 Other specified hypothyroidism: Secondary | ICD-10-CM

## 2020-11-28 NOTE — Telephone Encounter (Signed)
Next Visit: 02/12/2021  Last Visit: 09/12/2020  Last Fill: 01/08/2020  DX: Psoriatic arthritis   Current Dose per office note 09/12/2020: methotrexate 1 mL every 7 days  Labs: 09/12/2020 CBC and CMP are normal.  Okay to refill MTX?

## 2020-11-28 NOTE — Telephone Encounter (Signed)
Patient states she is not on Bactrim. Patient states she has not been on it in months.

## 2020-11-28 NOTE — Telephone Encounter (Signed)
Please clarify if she is taking bactrim.  Sulfa antibiotics cannot be taken while on MTX.

## 2020-11-28 NOTE — Telephone Encounter (Signed)
Attempted to contact the patient and left message for patient to call the office.  

## 2020-12-09 ENCOUNTER — Telehealth: Payer: Self-pay | Admitting: Family Medicine

## 2020-12-09 NOTE — Telephone Encounter (Signed)
Pt wants to know if she needs any pneumonia shots if so can she come in for those

## 2020-12-10 DIAGNOSIS — Z1231 Encounter for screening mammogram for malignant neoplasm of breast: Secondary | ICD-10-CM | POA: Diagnosis not present

## 2020-12-10 NOTE — Telephone Encounter (Signed)
Pt was advised April Cox 

## 2020-12-17 ENCOUNTER — Other Ambulatory Visit: Payer: Self-pay | Admitting: Obstetrics and Gynecology

## 2020-12-17 DIAGNOSIS — M810 Age-related osteoporosis without current pathological fracture: Secondary | ICD-10-CM

## 2021-01-05 ENCOUNTER — Other Ambulatory Visit: Payer: Self-pay | Admitting: Family Medicine

## 2021-01-29 NOTE — Progress Notes (Deleted)
Office Visit Note  Patient: April Cox             Date of Birth: 10-08-1955           MRN: [id number]             PCP: Denita Lung, MD Referring: Denita Lung, MD Visit Date: 02/12/2021 Occupation: @GUAROCC @  Subjective:  No chief complaint on file.   History of Present Illness: April Cox is a 66 y.o. female ***   Activities of Daily Living:  Patient reports morning stiffness for *** {minute/hour:19697}.   Patient {ACTIONS;DENIES/REPORTS:21021675::"Denies"} nocturnal pain.  Difficulty dressing/grooming: {ACTIONS;DENIES/REPORTS:21021675::"Denies"} Difficulty climbing stairs: {ACTIONS;DENIES/REPORTS:21021675::"Denies"} Difficulty getting out of chair: {ACTIONS;DENIES/REPORTS:21021675::"Denies"} Difficulty using hands for taps, buttons, cutlery, and/or writing: {ACTIONS;DENIES/REPORTS:21021675::"Denies"}  No Rheumatology ROS completed.   PMFS History:  Patient Active Problem List   Diagnosis Date Noted   Sicca complex ([place]) 04/01/2017   DDD (degenerative disc disease), lumbar 04/01/2017   Psoriasis 04/01/2017   Dry eye 01/01/2017   Spondylosis of lumbar region without myelopathy or radiculopathy 01/07/2016   High risk medication use 01/07/2016   Essential hypertension 09/10/2014   History of HPV infection 09/10/2014   History of colonic polyps 09/10/2014   Osteoporosis 09/10/2014   Gastroesophageal reflux disease without esophagitis 09/10/2014   Cigarette smoker 05/11/2011   Hyperlipidemia 08/22/2010   Hypothyroidism 08/22/2010   Psoriatic arthritis ([place]) 08/22/2010    Past Medical History:  Diagnosis Date   Abnormal Pap smear of cervix    Anemia    history of anemia   Arthritis    Cataract    Diverticulosis of colon    Family history of adverse reaction to anesthesia    mother PONV   GERD (gastroesophageal reflux disease)    Heart murmur    asymptomatic per pt   History of adenomatous polyp of colon    11-06-2009   tubullar   HPV in female    Hyperlipidemia    Hypertension    Hypothyroidism    Mild aortic valve stenosis    per echo 10-09-2010  ef 60-65%  valva area 1.48cm   Osteoporosis    Psoriasis    Psoriatic arthritis ([place])    Dr. Estanislado Pandy   Wears glasses     Family History  Problem Relation Age of Onset   Arthritis Mother        OA    Psoriasis Mother    Other Father        accidental death/MVA at age 70   Hypertension Brother    Other Brother        1 brother drowned   Cancer Brother    Hypertension Maternal Grandmother    Diabetes Maternal Grandmother    Hypertension Maternal Grandfather    Heart disease Neg Hx    Colon cancer Neg Hx    Colon polyps Neg Hx    Stomach cancer Neg Hx    Esophageal cancer Neg Hx    Past Surgical History:  Procedure Laterality Date   ABDOMINAL HYSTERECTOMY     ANTERIOR LAT LUMBAR FUSION  12/25/2011   Procedure: ANTERIOR LATERAL LUMBAR FUSION 1 LEVEL;  Surgeon: Eustace Moore, MD;  Location: [facility];  Service: Neurosurgery;  Laterality: Left;  LUMBAR FOUR-FIVE   BLADDER SUSPENSION N/A 12/19/2014   Procedure: TRANSVAGINAL TAPE (TVT) PROCEDURE;  Surgeon: Everett Graff, MD;  Location: [facility];  Service: Gynecology;  Laterality: N/A;   CARPAL TUNNEL RELEASE Right 04/2013   CATARACT EXTRACTION,  BILATERAL     05/2020, 06/2020   CERVICAL CONIZATION W/BX N/A 08/28/2014   Procedure: CONIZATION CERVIX WITH BIOPSY;  Surgeon: Ena Dawley, MD;  [facility];  Service: Gynecology;  Laterality: N/A;   COLONOSCOPY  11/06/2009   COLONOSCOPY  08/2019   CYSTO N/A 12/19/2014   Procedure: Kathrene Alu;  Surgeon: Everett Graff, MD;  Location: [facility];  Service: Gynecology;  Laterality: N/A;   DENTAL SURGERY     HYSTEROSCOPY WITH D & C  04/04/2008   KNEE ARTHROPLASTY     LUMBAR PERCUTANEOUS PEDICLE SCREW 1 LEVEL  12/25/2011   Procedure: LUMBAR PERCUTANEOUS PEDICLE SCREW 1 LEVEL;  Surgeon: Eustace Moore,  MD;  Location: [facility] [facility];  Service: Neurosurgery;  Laterality: Left;  LUMBAR FOUR-FIVE   PERONEAL NERVE DECOMPRESSION Left 12/27/2009   for neuropathy with  foot drop   POLYPECTOMY     TRANSTHORACIC ECHOCARDIOGRAM  10/09/2010   mild LVH,  ef 60-65%,  grade I diastolic dysfunction,  very mild AV stenosis (area 1.48cm/S/2(VTI) with no regurg./  mild MV calcification without stenosis or regur. /  trivial TR   VAGINAL HYSTERECTOMY Bilateral 12/19/2014   Procedure: TOTAL VAGINAL HYSTERECTOMY WITH BILATERAL SALPINGECTOMY;  Surgeon: Ena Dawley, MD;  Location: [facility];  Service: Gynecology;  Laterality: Bilateral;   Social History   Social History Narrative   Not on file   Immunization History  Administered Date(s) Administered   Fluad Quad(high Dose 65+) 10/11/2018   Influenza Split 12/19/2010, 12/01/2011   Influenza, High Dose Seasonal PF 11/16/2020   Influenza,inj,Quad PF,6+ Mos 01/21/2016   Influenza,inj,quad, With Preservative 12/04/2017   PFIZER(Purple Top)SARS-COV-2 Vaccination 02/27/2019, 03/24/2019, 10/02/2019, 05/23/2020   Pfizer Covid-19 Vaccine Bivalent Booster 62yrs & up 10/23/2020   Pneumococcal Conjugate-13 01/20/2014   Pneumococcal Polysaccharide-23 08/01/2010   Tdap 01/21/2016, 09/15/2017   Zoster Recombinat (Shingrix) 12/11/2017, 02/27/2018     Objective: Vital Signs: There were no vitals taken for this visit.   Physical Exam   Musculoskeletal Exam: ***  CDAI Exam: CDAI Score: -- Patient Global: --; Provider Global: -- Swollen: --; Tender: -- Joint Exam 02/12/2021   No joint exam has been documented for this visit   There is currently no information documented on the homunculus. Go to the Rheumatology activity and complete the homunculus joint exam.  Investigation: No additional findings.  Imaging: No results found.  Recent Labs: Lab Results  Component Value Date   WBC 7.7 09/12/2020   HGB 13.2 09/12/2020   PLT 297 09/12/2020    NA 137 09/12/2020   K 4.1 09/12/2020   CL 105 09/12/2020   CO2 22 09/12/2020   GLUCOSE 85 09/12/2020   BUN 4 (L) 09/12/2020   CREATININE 0.66 09/12/2020   BILITOT 0.4 09/12/2020   ALKPHOS 83 08/30/2017   AST 11 09/12/2020   ALT 7 09/12/2020   PROT 6.8 09/12/2020   ALBUMIN 4.5 08/30/2017   CALCIUM 9.7 09/12/2020   GFRAA 113 04/02/2020   QFTBGOLD Negative 08/02/2015   QFTBGOLDPLUS NEGATIVE 12/27/2019    Speciality Comments: Prior therapy: Remicade and Humira (inadequate response) Osteoporosis managed by PCP. Last DXA August 2019.  Procedures:  No procedures performed Allergies: Codeine, Other, Penicillins, and Gluten meal   Assessment / Plan:     Visit Diagnoses: No diagnosis found.  Orders: No orders of the defined types were placed in this encounter.  No orders of the defined types were placed in this encounter.   Face-to-face time spent with patient was *** minutes. Greater  than 50% of time was spent in counseling and coordination of care.  Follow-Up Instructions: No follow-ups on file.   Earnestine Mealing, CMA  Note - This record has been created using Editor, commissioning.  Chart creation errors have been sought, but may not always  have been located. Such creation errors do not reflect on  the standard of medical care.

## 2021-02-12 ENCOUNTER — Ambulatory Visit: Payer: Medicare Other | Admitting: Rheumatology

## 2021-02-12 DIAGNOSIS — M19041 Primary osteoarthritis, right hand: Secondary | ICD-10-CM

## 2021-02-12 DIAGNOSIS — F17200 Nicotine dependence, unspecified, uncomplicated: Secondary | ICD-10-CM

## 2021-02-12 DIAGNOSIS — Z8679 Personal history of other diseases of the circulatory system: Secondary | ICD-10-CM

## 2021-02-12 DIAGNOSIS — Z8719 Personal history of other diseases of the digestive system: Secondary | ICD-10-CM

## 2021-02-12 DIAGNOSIS — Z8639 Personal history of other endocrine, nutritional and metabolic disease: Secondary | ICD-10-CM

## 2021-02-12 DIAGNOSIS — M7062 Trochanteric bursitis, left hip: Secondary | ICD-10-CM

## 2021-02-12 DIAGNOSIS — Z86010 Personal history of colonic polyps: Secondary | ICD-10-CM

## 2021-02-12 DIAGNOSIS — M3500 Sicca syndrome, unspecified: Secondary | ICD-10-CM

## 2021-02-12 DIAGNOSIS — M5136 Other intervertebral disc degeneration, lumbar region: Secondary | ICD-10-CM

## 2021-02-12 DIAGNOSIS — M810 Age-related osteoporosis without current pathological fracture: Secondary | ICD-10-CM

## 2021-02-12 DIAGNOSIS — L4050 Arthropathic psoriasis, unspecified: Secondary | ICD-10-CM

## 2021-02-12 DIAGNOSIS — L409 Psoriasis, unspecified: Secondary | ICD-10-CM

## 2021-02-12 DIAGNOSIS — Z79899 Other long term (current) drug therapy: Secondary | ICD-10-CM

## 2021-02-13 NOTE — Progress Notes (Signed)
Office Visit Note  Patient: April Cox             Date of Birth: 03/04/1955           MRN: [id number]             PCP: Denita Lung, MD Referring: Denita Lung, MD Visit Date: 02/27/2021 Occupation: @GUAROCC @  Subjective:  Intermittent pain in both hands  History of Present Illness: TANAYAH SQUITIERI is a 66 y.o. female with history of psoriatic arthritis, osteoarthritis, DDD, and osteoporosis.  Patient is on Cosentyx 150 mg subcutaneous injections every 14 days, methotrexate 1 mL sq injections once weekly, and folic acid 2 mg daily.  She is tolerating these medications without any side effects or injection site reactions.  Patient reports that she continues to experience intermittent pain in both hands and both feet but overall her psoriatic arthritis has been well controlled.  She denies any joint swelling at this time.  She denies any Achilles tendinitis or planter fasciitis.  She continues to experience intermittent discomfort in her SI joints.  She has not had any nocturnal pain.  She has been sleeping well at night.  Her morning stiffness has been lasting about 30 to 60 minutes daily. She has no active psoriasis at this time. She remains on Tymlos injections on a daily basis.  She is taking a calcium and vitamin D supplement daily.  She denies any recent falls or fractures. She denies any recent infections.     Activities of Daily Living:  Patient reports morning stiffness for 30-60 minutes.   Patient Denies nocturnal pain.  Difficulty dressing/grooming: Denies Difficulty climbing stairs: Denies Difficulty getting out of chair: Denies Difficulty using hands for taps, buttons, cutlery, and/or writing: Reports  Review of Systems  Constitutional:  Positive for fatigue.  HENT:  Positive for mouth dryness. Negative for mouth sores and nose dryness.   Eyes:  Positive for itching and dryness. Negative for pain.  Respiratory:  Negative for shortness of breath and difficulty  breathing.   Cardiovascular:  Negative for chest pain and palpitations.  Gastrointestinal:  Negative for blood in stool, constipation and diarrhea.  Endocrine: Negative for increased urination.  Genitourinary:  Negative for difficulty urinating.  Musculoskeletal:  Positive for joint pain, joint pain, myalgias, morning stiffness and myalgias. Negative for joint swelling and muscle tenderness.  Skin:  Negative for color change, rash and redness.  Allergic/Immunologic: Negative for susceptible to infections.  Neurological:  Negative for dizziness, numbness, headaches, memory loss and weakness.  Hematological:  Positive for bruising/bleeding tendency.  Psychiatric/Behavioral:  Negative for confusion.    PMFS History:  Patient Active Problem List   Diagnosis Date Noted   Sicca complex ([place]) 04/01/2017   DDD (degenerative disc disease), lumbar 04/01/2017   Psoriasis 04/01/2017   Dry eye 01/01/2017   Spondylosis of lumbar region without myelopathy or radiculopathy 01/07/2016   High risk medication use 01/07/2016   Essential hypertension 09/10/2014   History of HPV infection 09/10/2014   History of colonic polyps 09/10/2014   Osteoporosis 09/10/2014   Gastroesophageal reflux disease without esophagitis 09/10/2014   Cigarette smoker 05/11/2011   Hyperlipidemia 08/22/2010   Hypothyroidism 08/22/2010   Psoriatic arthritis ([place]) 08/22/2010    Past Medical History:  Diagnosis Date   Abnormal Pap smear of cervix    Anemia    history of anemia   Arthritis    Cataract    Diverticulosis of colon    Family history of adverse  reaction to anesthesia    mother PONV   GERD (gastroesophageal reflux disease)    Heart murmur    asymptomatic per pt   History of adenomatous polyp of colon    11-06-2009  tubullar   HPV in female    Hyperlipidemia    Hypertension    Hypothyroidism    Mild aortic valve stenosis    per echo 10-09-2010  ef 60-65%  valva area 1.48cm   Osteoporosis    Psoriasis     Psoriatic arthritis ([place])    Dr. Estanislado Pandy   Wears glasses     Family History  Problem Relation Age of Onset   Arthritis Mother        OA    Psoriasis Mother    Other Father        accidental death/MVA at age 15   Hypertension Brother    Other Brother        1 brother drowned   Cancer Brother    Esophageal cancer Brother    Brain cancer Brother    Hypertension Maternal Grandmother    Diabetes Maternal Grandmother    Hypertension Maternal Grandfather    Heart disease Neg Hx    Colon cancer Neg Hx    Colon polyps Neg Hx    Stomach cancer Neg Hx    Past Surgical History:  Procedure Laterality Date   ABDOMINAL HYSTERECTOMY     ANTERIOR LAT LUMBAR FUSION  12/25/2011   Procedure: ANTERIOR LATERAL LUMBAR FUSION 1 LEVEL;  Surgeon: Eustace Moore, MD;  Location: [facility];  Service: Neurosurgery;  Laterality: Left;  LUMBAR FOUR-FIVE   BLADDER SUSPENSION N/A 12/19/2014   Procedure: TRANSVAGINAL TAPE (TVT) PROCEDURE;  Surgeon: Everett Graff, MD;  Location: [facility];  Service: Gynecology;  Laterality: N/A;   CARPAL TUNNEL RELEASE Right 04/2013   CATARACT EXTRACTION, BILATERAL     05/2020, 06/2020   CERVICAL CONIZATION W/BX N/A 08/28/2014   Procedure: CONIZATION CERVIX WITH BIOPSY;  Surgeon: Ena Dawley, MD;  Location: [facility];  Service: Gynecology;  Laterality: N/A;   COLONOSCOPY  11/06/2009   COLONOSCOPY  08/2019   CYSTO N/A 12/19/2014   Procedure: Kathrene Alu;  Surgeon: Everett Graff, MD;  Location: [facility];  Service: Gynecology;  Laterality: N/A;   DENTAL SURGERY     HYSTEROSCOPY WITH D & C  04/04/2008   KNEE ARTHROPLASTY     LUMBAR PERCUTANEOUS PEDICLE SCREW 1 LEVEL  12/25/2011   Procedure: LUMBAR PERCUTANEOUS PEDICLE SCREW 1 LEVEL;  Surgeon: Eustace Moore, MD;  Location: [facility] [facility];  Service: Neurosurgery;  Laterality: Left;  LUMBAR FOUR-FIVE   PERONEAL NERVE DECOMPRESSION Left 12/27/2009   for neuropathy with  foot drop   POLYPECTOMY     TRANSTHORACIC  ECHOCARDIOGRAM  10/09/2010   mild LVH,  ef 60-65%,  grade I diastolic dysfunction,  very mild AV stenosis (area 1.48cm/S/2(VTI) with no regurg./  mild MV calcification without stenosis or regur. /  trivial TR   VAGINAL HYSTERECTOMY Bilateral 12/19/2014   Procedure: TOTAL VAGINAL HYSTERECTOMY WITH BILATERAL SALPINGECTOMY;  Surgeon: Ena Dawley, MD;  Location: [facility];  Service: Gynecology;  Laterality: Bilateral;   Social History   Social History Narrative   Not on file   Immunization History  Administered Date(s) Administered   Fluad Quad(high Dose 65+) 10/11/2018   Influenza Split 12/19/2010, 12/01/2011   Influenza, High Dose Seasonal PF 11/16/2020   Influenza,inj,Quad PF,6+ Mos 01/21/2016   Influenza,inj,quad, With Preservative 12/04/2017   PFIZER(Purple Top)SARS-COV-2  Vaccination 02/27/2019, 03/24/2019, 10/02/2019, 05/23/2020   Pfizer Covid-19 Vaccine Bivalent Booster 58yrs & up 10/23/2020   Pneumococcal Conjugate-13 01/20/2014   Pneumococcal Polysaccharide-23 08/01/2010   Tdap 01/21/2016, 09/15/2017   Zoster Recombinat (Shingrix) 12/11/2017, 02/27/2018     Objective: Vital Signs: BP (!) 180/78 (BP Location: Left Arm, Patient Position: Sitting, Cuff Size: Normal)    Pulse 67    Ht 5' 5.5" (1.664 m)    Wt 111 lb (50.3 kg)    BMI 18.19 kg/m    Physical Exam Vitals and nursing note reviewed.  Constitutional:      Appearance: She is well-developed.  HENT:     Head: Normocephalic and atraumatic.  Eyes:     Conjunctiva/sclera: Conjunctivae normal.  Pulmonary:     Effort: Pulmonary effort is normal.  Abdominal:     Palpations: Abdomen is soft.  Musculoskeletal:     Cervical back: Normal range of motion.  Skin:    General: Skin is warm and dry.     Capillary Refill: Capillary refill takes less than 2 seconds.  Neurological:     Mental Status: She is alert and oriented to person, place, and time.  Psychiatric:        Behavior: Behavior normal.     Musculoskeletal  Exam: C-spine has slightly limited ROM with lateral rotation.  No midline spinal tenderness or SI joint tenderness.  Shoulder joints, elbow joints, and wrist joints have good range of motion with no discomfort. Severe PIP and DIP thickening consistent with osteoarthritis.  CMC joint thickening consistent.  Hip joints, knee joints, and ankle joints good ROM.  No warmth or effusion of knee joints.  No tenderness or swelling of ankle joints. No evidence achilles tendonitis or plantar fasciitis.  Some tenderness over the right trochanteric bursa was noted.  No tenderness over MTP joints.  CDAI Exam: CDAI Score: -- Patient Global: --; Provider Global: -- Swollen: --; Tender: -- Joint Exam 02/27/2021   No joint exam has been documented for this visit   There is currently no information documented on the homunculus. Go to the Rheumatology activity and complete the homunculus joint exam.  Investigation: No additional findings.  Imaging: No results found.  Recent Labs: Lab Results  Component Value Date   WBC 7.7 09/12/2020   HGB 13.2 09/12/2020   PLT 297 09/12/2020   NA 137 09/12/2020   K 4.1 09/12/2020   CL 105 09/12/2020   CO2 22 09/12/2020   GLUCOSE 85 09/12/2020   BUN 4 (L) 09/12/2020   CREATININE 0.66 09/12/2020   BILITOT 0.4 09/12/2020   ALKPHOS 83 08/30/2017   AST 11 09/12/2020   ALT 7 09/12/2020   PROT 6.8 09/12/2020   ALBUMIN 4.5 08/30/2017   CALCIUM 9.7 09/12/2020   GFRAA 113 04/02/2020   QFTBGOLD Negative 08/02/2015   QFTBGOLDPLUS NEGATIVE 12/27/2019    Speciality Comments: Prior therapy: Remicade and Humira (inadequate response) Osteoporosis managed by PCP. Last DXA August 2019.  Procedures:  No procedures performed Allergies: Codeine, Other, Penicillins, and Gluten meal   Assessment / Plan:     Visit Diagnoses: Psoriatic arthritis ([place]): She has no synovitis or dactylitis on examination today.  She is clinically doing well on Cosentyx 150 mg subcutaneous  injections every 14 days, methotrexate 1 mL sq injections once weekly, and folic acid 2 mg daily.  She is tolerating these medications without any side effects.  She continues to experience intermittent pain and stiffness in both hands and both feet but has no inflammation  on examination today.  She has occasional SI joint discomfort.  No evidence of Achilles tendinitis or planter fasciitis on examination today.  She has no active psoriasis.  She has found her psoriatic arthritis to be well controlled on the current treatment regimen.  No medication changes will be made at this time.  She was advised to notify us if she develops signs or symptoms of a flare.  She will follow-up in the office in 5 months.  Psoriasis: She has no active psoriasis at this time.   High risk medication use - Cosentyx 150 mg every 14 days (started September 2019), methotrexate 1 mL every 7 days, and folic acid 1 mg 2 tablets daily. Inadequate response to remicade.  CBC and CMP drawn on 09/12/2020.  She is overdue to update lab work.  Orders for CBC and CMP were released.  Her next lab work will be due in May and every 3 months to monitor for drug toxicity.  TB Gold negative on 12/27/2019.  Order for TB gold released today. - Plan: CBC with Differential/Platelet, COMPLETE METABOLIC PANEL WITH GFR, QuantiFERON-TB Gold Plus No recent infections.  Discussed the importance of holding Cosentyx and methotrexate if she develops signs or symptoms of an infection and to resume once the infection is completely cleared.  Screening for tuberculosis - Order for TB gold released. .  Plan: QuantiFERON-TB Gold Plus  Sicca complex ([place]): Unchanged. Tolerable.   Primary osteoarthritis of both hands: She has severe PIP and DIP thickening consistent with osteoarthritis of both hands. No inflammation was noted on examination today.  Discussed the importance of joint protection and muscle strengthening.   Trochanteric bursitis of right hip: She has  some tenderness to palpation over the right trochanteric bursa.  Discussed the importance of performing stretching exercises on a daily basis.  DDD (degenerative disc disease), lumbar: She is not experiencing any increased discomfort in her lower back at this time.  She has no symptoms of radiculopathy.  Age-related osteoporosis without current pathological fracture: DEXA on 09/20/2018  1/3 distal radius BMD 0.381 with T-score -5.2.  She is currently on Tymlos prescribed by Dr. Redmond School.  She continues to take a calcium and vitamin D supplement daily. No recent falls or fractures. She is scheduled to update DEXA in April 2023.   Other medical conditions are listed as follows:   History of hypothyroidism  History of hypertension  History of gastroesophageal reflux (GERD)  History of colonic polyps  Smoker   Orders: Orders Placed This Encounter  Procedures   CBC with Differential/Platelet   COMPLETE METABOLIC PANEL WITH GFR   QuantiFERON-TB Gold Plus   Meds ordered this encounter  Medications   methotrexate 250 MG/10ML injection    Sig: INJECT ONE MILLILITER UNDER THE SKIN ONCE WEEKLY    Dispense:  10 mL    Refill:  0   TUBERCULIN SYR 1CC/27GX1/2" (MONOJECT 1CC TB SYR 27GX1/2") 27G X 1/2" 1 ML MISC    Sig: USE AS DIRECTED    Dispense:  12 each    Refill:  3    Follow-Up Instructions: Return in about 5 months (around 07/27/2021) for Psoriatic arthritis, Osteoarthritis, DDD.   Ofilia Neas, PA-C  Note - This record has been created using Dragon software.  Chart creation errors have been sought, but may not always  have been located. Such creation errors do not reflect on  the standard of medical care.

## 2021-02-18 ENCOUNTER — Other Ambulatory Visit: Payer: Self-pay | Admitting: Physician Assistant

## 2021-02-18 NOTE — Telephone Encounter (Signed)
Next Visit: 02/27/2021   Last Visit: 09/12/2020   Last Fill:    DX: Psoriatic arthritis    Current Dose per office note 0/03/7046: folic acid 1 mg 2 tablets daily   Okay to refill Folic Acid?

## 2021-02-20 ENCOUNTER — Other Ambulatory Visit: Payer: Self-pay | Admitting: Physician Assistant

## 2021-02-27 ENCOUNTER — Encounter: Payer: Self-pay | Admitting: Physician Assistant

## 2021-02-27 ENCOUNTER — Other Ambulatory Visit: Payer: Self-pay

## 2021-02-27 ENCOUNTER — Ambulatory Visit: Payer: Medicare Other | Admitting: Physician Assistant

## 2021-02-27 VITALS — BP 180/78 | HR 67 | Ht 65.5 in | Wt 111.0 lb

## 2021-02-27 DIAGNOSIS — Z79899 Other long term (current) drug therapy: Secondary | ICD-10-CM

## 2021-02-27 DIAGNOSIS — M19042 Primary osteoarthritis, left hand: Secondary | ICD-10-CM

## 2021-02-27 DIAGNOSIS — Z86010 Personal history of colonic polyps: Secondary | ICD-10-CM

## 2021-02-27 DIAGNOSIS — M3500 Sicca syndrome, unspecified: Secondary | ICD-10-CM

## 2021-02-27 DIAGNOSIS — M5136 Other intervertebral disc degeneration, lumbar region: Secondary | ICD-10-CM

## 2021-02-27 DIAGNOSIS — Z8719 Personal history of other diseases of the digestive system: Secondary | ICD-10-CM

## 2021-02-27 DIAGNOSIS — M7061 Trochanteric bursitis, right hip: Secondary | ICD-10-CM

## 2021-02-27 DIAGNOSIS — M19041 Primary osteoarthritis, right hand: Secondary | ICD-10-CM | POA: Diagnosis not present

## 2021-02-27 DIAGNOSIS — L4050 Arthropathic psoriasis, unspecified: Secondary | ICD-10-CM

## 2021-02-27 DIAGNOSIS — L409 Psoriasis, unspecified: Secondary | ICD-10-CM | POA: Diagnosis not present

## 2021-02-27 DIAGNOSIS — Z8639 Personal history of other endocrine, nutritional and metabolic disease: Secondary | ICD-10-CM | POA: Diagnosis not present

## 2021-02-27 DIAGNOSIS — Z8679 Personal history of other diseases of the circulatory system: Secondary | ICD-10-CM

## 2021-02-27 DIAGNOSIS — M810 Age-related osteoporosis without current pathological fracture: Secondary | ICD-10-CM

## 2021-02-27 DIAGNOSIS — F17200 Nicotine dependence, unspecified, uncomplicated: Secondary | ICD-10-CM

## 2021-02-27 DIAGNOSIS — Z111 Encounter for screening for respiratory tuberculosis: Secondary | ICD-10-CM

## 2021-02-27 DIAGNOSIS — M7062 Trochanteric bursitis, left hip: Secondary | ICD-10-CM

## 2021-02-27 MED ORDER — METHOTREXATE SODIUM CHEMO INJECTION 250 MG/10ML
INTRAMUSCULAR | 0 refills | Status: DC
Start: 2021-02-27 — End: 2021-06-18

## 2021-02-27 MED ORDER — "MONOJECT TB SYRINGE 27G X 1/2"" 1 ML MISC": 3 refills | Status: DC

## 2021-02-27 NOTE — Patient Instructions (Signed)
Standing Labs °We placed an order today for your standing lab work.  ° °Please have your standing labs drawn in May and every 3 months  ° ° °If possible, please have your labs drawn 2 weeks prior to your appointment so that the provider can discuss your results at your appointment. ° °Please note that you may see your imaging and lab results in MyChart before we have reviewed them. °We may be awaiting multiple results to interpret others before contacting you. °Please allow our office up to 72 hours to thoroughly review all of the results before contacting the office for clarification of your results. ° °We have open lab daily: °Monday through Thursday from 1:30-4:30 PM and Friday from 1:30-4:00 PM °at the office of Dr. Shaili Deveshwar, [facility].   °Please be advised, all patients with office appointments requiring lab work will take precedent over walk-in lab work.  °If possible, please come for your lab work on Monday and Friday afternoons, as you may experience shorter wait times. °The office is located at [address], [address], [place], [place] [address] °No appointment is necessary.   °Labs are drawn by [facility]. Please bring your co-pay at the time of your lab draw.  You may receive a bill from [facility] for your lab work. ° °Please note if you are on Hydroxychloroquine and and an order has been placed for a Hydroxychloroquine level, you will need to have it drawn 4 hours or more after your last dose. ° °If you wish to have your labs drawn at another location, please call the office 24 hours in advance to send orders. ° °If you have any questions regarding directions or hours of operation,  °please call [phone].   °As a reminder, please drink plenty of water prior to coming for your lab work. Thanks! ° °

## 2021-02-28 NOTE — Progress Notes (Signed)
CBC and CMP are normal.

## 2021-03-03 LAB — CBC WITH DIFFERENTIAL/PLATELET
Absolute Monocytes: 391 cells/uL (ref 200–950)
Basophils Absolute: 7 cells/uL (ref 0–200)
Basophils Relative: 0.1 %
Eosinophils Absolute: 50 cells/uL (ref 15–500)
Eosinophils Relative: 0.7 %
HCT: 38.5 % (ref 35.0–45.0)
Hemoglobin: 12.7 g/dL (ref 11.7–15.5)
Lymphs Abs: 1882 cells/uL (ref 850–3900)
MCH: 32.6 pg (ref 27.0–33.0)
MCHC: 33 g/dL (ref 32.0–36.0)
MCV: 98.7 fL (ref 80.0–100.0)
MPV: 9.9 fL (ref 7.5–12.5)
Monocytes Relative: 5.5 %
Neutro Abs: 4771 cells/uL (ref 1500–7800)
Neutrophils Relative %: 67.2 %
Platelets: 320 10*3/uL (ref 140–400)
RBC: 3.9 10*6/uL (ref 3.80–5.10)
RDW: 13.4 % (ref 11.0–15.0)
Total Lymphocyte: 26.5 %
WBC: 7.1 10*3/uL (ref 3.8–10.8)

## 2021-03-03 LAB — COMPLETE METABOLIC PANEL WITH GFR
AG Ratio: 1.5 (calc) (ref 1.0–2.5)
ALT: 13 U/L (ref 6–29)
AST: 15 U/L (ref 10–35)
Albumin: 4.3 g/dL (ref 3.6–5.1)
Alkaline phosphatase (APISO): 76 U/L (ref 37–153)
BUN/Creatinine Ratio: 8 (calc) (ref 6–22)
BUN: 5 mg/dL — ABNORMAL LOW (ref 7–25)
CO2: 29 mmol/L (ref 20–32)
Calcium: 10 mg/dL (ref 8.6–10.4)
Chloride: 105 mmol/L (ref 98–110)
Creat: 0.66 mg/dL (ref 0.50–1.05)
Globulin: 2.8 g/dL (calc) (ref 1.9–3.7)
Glucose, Bld: 72 mg/dL (ref 65–99)
Potassium: 4.5 mmol/L (ref 3.5–5.3)
Sodium: 139 mmol/L (ref 135–146)
Total Bilirubin: 0.4 mg/dL (ref 0.2–1.2)
Total Protein: 7.1 g/dL (ref 6.1–8.1)
eGFR: 96 mL/min/{1.73_m2} (ref >=60)

## 2021-03-03 LAB — QUANTIFERON-TB GOLD PLUS
Mitogen-NIL: 8.06 IU/mL
NIL: 0.05 IU/mL
QuantiFERON-TB Gold Plus: NEGATIVE
TB1-NIL: <0 IU/mL
TB2-NIL: 0 IU/mL

## 2021-03-03 NOTE — Progress Notes (Signed)
TB gold negative

## 2021-04-17 ENCOUNTER — Other Ambulatory Visit: Payer: Self-pay | Admitting: Family Medicine

## 2021-04-21 DIAGNOSIS — Z1231 Encounter for screening mammogram for malignant neoplasm of breast: Secondary | ICD-10-CM | POA: Diagnosis not present

## 2021-04-21 DIAGNOSIS — Z681 Body mass index (BMI) 19 or less, adult: Secondary | ICD-10-CM | POA: Diagnosis not present

## 2021-04-21 DIAGNOSIS — Z1211 Encounter for screening for malignant neoplasm of colon: Secondary | ICD-10-CM | POA: Diagnosis not present

## 2021-04-21 DIAGNOSIS — M810 Age-related osteoporosis without current pathological fracture: Secondary | ICD-10-CM | POA: Diagnosis not present

## 2021-04-22 DIAGNOSIS — H353132 Nonexudative age-related macular degeneration, bilateral, intermediate dry stage: Secondary | ICD-10-CM | POA: Diagnosis not present

## 2021-04-22 DIAGNOSIS — H04123 Dry eye syndrome of bilateral lacrimal glands: Secondary | ICD-10-CM | POA: Diagnosis not present

## 2021-04-22 DIAGNOSIS — H26493 Other secondary cataract, bilateral: Secondary | ICD-10-CM | POA: Diagnosis not present

## 2021-04-22 DIAGNOSIS — H35033 Hypertensive retinopathy, bilateral: Secondary | ICD-10-CM | POA: Diagnosis not present

## 2021-04-23 DIAGNOSIS — M810 Age-related osteoporosis without current pathological fracture: Secondary | ICD-10-CM | POA: Diagnosis not present

## 2021-04-24 ENCOUNTER — Ambulatory Visit
Admission: RE | Admit: 2021-04-24 | Discharge: 2021-04-24 | Disposition: A | Discharge status: Home / Self Care | Payer: Medicare Other | Source: Ambulatory Visit | Attending: Obstetrics and Gynecology | Admitting: Obstetrics and Gynecology

## 2021-04-24 DIAGNOSIS — M810 Age-related osteoporosis without current pathological fracture: Secondary | ICD-10-CM | POA: Diagnosis not present

## 2021-04-24 DIAGNOSIS — M85851 Other specified disorders of bone density and structure, right thigh: Secondary | ICD-10-CM | POA: Diagnosis not present

## 2021-04-24 DIAGNOSIS — Z780 Asymptomatic menopausal state: Secondary | ICD-10-CM | POA: Diagnosis not present

## 2021-04-24 LAB — HM DEXA SCAN

## 2021-04-29 ENCOUNTER — Telehealth: Payer: Self-pay

## 2021-04-29 NOTE — Telephone Encounter (Signed)
Lvm for pt to call back to schedule a virtual visit to go over her dexa scan results of osteoporosis. [place] ?

## 2021-04-30 ENCOUNTER — Encounter: Payer: Self-pay | Admitting: Family Medicine

## 2021-05-17 ENCOUNTER — Other Ambulatory Visit: Payer: Self-pay | Admitting: Family Medicine

## 2021-05-17 DIAGNOSIS — E038 Other specified hypothyroidism: Secondary | ICD-10-CM

## 2021-06-18 ENCOUNTER — Telehealth: Payer: Self-pay | Admitting: Family Medicine

## 2021-06-18 ENCOUNTER — Other Ambulatory Visit: Payer: Self-pay | Admitting: Physician Assistant

## 2021-06-18 NOTE — Telephone Encounter (Signed)
Left message for patient to call back and schedule Medicare Annual Wellness Visit (AWV) either virtually or in office. I left my number for patient to call [phone].  Last WTM 06/28/21 ; please schedule at anytime with health coach  This should be a 45 minute visit.    I wanted to see of patient would schedule with [facility] prior to 07/11/21 appt with provider

## 2021-06-18 NOTE — Telephone Encounter (Signed)
Next Visit: 07/31/2021  Last Visit: 02/27/2021  Last Fill: 02/27/2021  DX: Psoriatic arthritis   Current Dose per office note 02/27/2021: methotrexate 1 mL every 7 days  Labs: 02/27/2021 CBC and CMP are normal.  Attempted to contact the patient and left message to advise patient she is due to update labs.   Okay to refill MTX?

## 2021-07-03 ENCOUNTER — Ambulatory Visit: Payer: Medicare Other | Admitting: Family Medicine

## 2021-07-08 ENCOUNTER — Other Ambulatory Visit: Payer: Self-pay | Admitting: Family Medicine

## 2021-07-11 ENCOUNTER — Ambulatory Visit: Payer: Medicare Other | Admitting: Family Medicine

## 2021-07-12 ENCOUNTER — Other Ambulatory Visit: Payer: Self-pay | Admitting: Family Medicine

## 2021-07-13 ENCOUNTER — Other Ambulatory Visit: Payer: Medicare Other

## 2021-07-18 ENCOUNTER — Telehealth: Payer: Self-pay | Admitting: Family Medicine

## 2021-07-18 NOTE — Telephone Encounter (Signed)
Left message for patient to call back and schedule Medicare Annual Wellness Visit (AWV) either virtually or in office. I left my number for patient to call [phone].  Last WTM 06/28/20  please schedule at anytime with health coach  This should be a 45 minute visit.    I wanted to see if patient would do AWV with Pamala Hurry prior to appt with dr Redmond School

## 2021-07-23 NOTE — Progress Notes (Signed)
Office Visit Note  Patient: April Cox             Date of Birth: 03-21-1955           MRN: [id number]             PCP: Denita Lung, MD Referring: Denita Lung, MD Visit Date: 07/31/2021 Occupation: '@GUAROCC'$ @  Subjective:  Medication management  History of Present Illness: ADIN Cox is a 66 y.o. female with history of psoriatic arthritis, psoriasis and sicca symptoms.  She states she has been taking Cosentyx 150 mg every other week and methotrexate 1 mL subcu weekly without any side effects.  She has not had any increased joint swelling or pain.  She continues to have some stiffness due to arthritis.  She complains of dry mouth and dry eyes.  She states she had recent cataract surgery and also was diagnosed with macular degeneration.  She is taking vitamins now.  Trochanteric bursitis has resolved.  She continues to have some lower back pain.  She finished a course of Tymlos and had recent DEXA scan.  She will be discussing future treatment with her PCP.  Activities of Daily Living:  Patient reports morning stiffness for 30 minutes.   Patient Denies nocturnal pain.  Difficulty dressing/grooming: Denies Difficulty climbing stairs: Denies Difficulty getting out of chair: Denies Difficulty using hands for taps, buttons, cutlery, and/or writing: Reports  Review of Systems  Constitutional:  Negative for fatigue.  HENT:  Positive for mouth dryness. Negative for mouth sores.   Eyes:  Positive for dryness.  Respiratory:  Negative for shortness of breath.   Cardiovascular:  Negative for chest pain and palpitations.  Gastrointestinal:  Negative for blood in stool, constipation and diarrhea.  Endocrine: Negative for increased urination.  Genitourinary:  Negative for involuntary urination.  Musculoskeletal:  Positive for myalgias, morning stiffness and myalgias. Negative for joint pain, joint pain, joint swelling, muscle weakness and muscle tenderness.  Skin:  Positive for  rash. Negative for color change, hair loss and sensitivity to sunlight.  Allergic/Immunologic: Negative for susceptible to infections.  Neurological:  Negative for dizziness and headaches.  Hematological:  Negative for swollen glands.  Psychiatric/Behavioral:  Negative for depressed mood and sleep disturbance. The patient is not nervous/anxious.     PMFS History:  Patient Active Problem List   Diagnosis Date Noted   Sicca complex ([place]) 04/01/2017   DDD (degenerative disc disease), lumbar 04/01/2017   Psoriasis 04/01/2017   Dry eye 01/01/2017   Spondylosis of lumbar region without myelopathy or radiculopathy 01/07/2016   High risk medication use 01/07/2016   Essential hypertension 09/10/2014   History of HPV infection 09/10/2014   History of colonic polyps 09/10/2014   Osteoporosis 09/10/2014   Gastroesophageal reflux disease without esophagitis 09/10/2014   Cigarette smoker 05/11/2011   Hyperlipidemia 08/22/2010   Hypothyroidism 08/22/2010   Psoriatic arthritis ([place]) 08/22/2010    Past Medical History:  Diagnosis Date   Abnormal Pap smear of cervix    Anemia    history of anemia   Arthritis    Cataract    Diverticulosis of colon    Family history of adverse reaction to anesthesia    mother PONV   GERD (gastroesophageal reflux disease)    Heart murmur    asymptomatic per pt   History of adenomatous polyp of colon    11-06-2009  tubullar   HPV in female    Hyperlipidemia    Hypertension    Hypothyroidism  Mild aortic valve stenosis    per echo 10-09-2010  ef 60-65%  valva area April48cm   Osteoporosis    Psoriasis    Psoriatic arthritis ([place])    Dr. Estanislado Pandy   Wears glasses     Family History  Problem Relation Age of Onset   Arthritis Mother        OA    Psoriasis Mother    Other Father        accidental death/MVA at age 28   Hypertension Brother    Other Brother        1 brother drowned   Cancer Brother    Esophageal cancer Brother    Brain cancer  Brother    Hypertension Maternal Grandmother    Diabetes Maternal Grandmother    Hypertension Maternal Grandfather    Heart disease Neg Hx    Colon cancer Neg Hx    Colon polyps Neg Hx    Stomach cancer Neg Hx    Past Surgical History:  Procedure Laterality Date   ABDOMINAL HYSTERECTOMY     ANTERIOR LAT LUMBAR FUSION  12/25/2011   Procedure: ANTERIOR LATERAL LUMBAR FUSION 1 LEVEL;  Surgeon: Eustace Moore, MD;  Location: [facility];  Service: Neurosurgery;  Laterality: Left;  LUMBAR FOUR-FIVE   BLADDER SUSPENSION N/A 12/19/2014   Procedure: TRANSVAGINAL TAPE (TVT) PROCEDURE;  Surgeon: Everett Graff, MD;  Location: [facility];  Service: Gynecology;  Laterality: N/A;   CARPAL TUNNEL RELEASE Right 04/2013   CATARACT EXTRACTION, BILATERAL     05/2020, 06/2020   CERVICAL CONIZATION W/BX N/A 08/28/2014   Procedure: CONIZATION CERVIX WITH BIOPSY;  Surgeon: Ena Dawley, MD;  Location: [facility];  Service: Gynecology;  Laterality: N/A;   COLONOSCOPY  11/06/2009   COLONOSCOPY  08/2019   CYSTO N/A 12/19/2014   Procedure: Kathrene Alu;  Surgeon: Everett Graff, MD;  Location: [facility];  Service: Gynecology;  Laterality: N/A;   DENTAL SURGERY     HYSTEROSCOPY WITH D & C  04/04/2008   KNEE ARTHROPLASTY     LUMBAR PERCUTANEOUS PEDICLE SCREW 1 LEVEL  12/25/2011   Procedure: LUMBAR PERCUTANEOUS PEDICLE SCREW 1 LEVEL;  Surgeon: Eustace Moore, MD;  Location: [facility];  Service: Neurosurgery;  Laterality: Left;  LUMBAR FOUR-FIVE   PERONEAL NERVE DECOMPRESSION Left 12/27/2009   for neuropathy with  foot drop   POLYPECTOMY     TRANSTHORACIC ECHOCARDIOGRAM  10/09/2010   mild LVH,  ef 60-65%,  grade I diastolic dysfunction,  very mild AV stenosis (area April48cm/S/2(VTI) with no regurg./  mild MV calcification without stenosis or regur. /  trivial TR   VAGINAL HYSTERECTOMY Bilateral 12/19/2014   Procedure: TOTAL VAGINAL HYSTERECTOMY WITH BILATERAL SALPINGECTOMY;  Surgeon: Ena Dawley, MD;   Location: [facility];  Service: Gynecology;  Laterality: Bilateral;   Social History   Social History Narrative   Not on file   Immunization History  Administered Date(s) Administered   Fluad Quad(high Dose 65+) 10/11/2018   Influenza Split 12/19/2010, 12/01/2011   Influenza, High Dose Seasonal PF 11/16/2020   Influenza,inj,Quad PF,6+ Mos 01/21/2016   Influenza,inj,quad, With Preservative 12/04/2017   PFIZER(Purple Top)SARS-COV-2 Vaccination 02/27/2019, 03/24/2019, 10/02/2019, 05/23/2020   Pfizer Covid-19 Vaccine Bivalent Booster 15yr & up 10/23/2020   Pneumococcal Conjugate-13 01/20/2014   Pneumococcal Polysaccharide-23 08/01/2010   Tdap 01/21/2016, 09/15/2017   Zoster Recombinat (Shingrix) 12/11/2017, 02/27/2018     Objective: Vital Signs: BP (!) 192/97 (BP Location: Left Arm, Patient Position: Sitting, Cuff Size: Normal)  Pulse 77   Ht '5\' 5"'$  (April651 m)   Wt 113 lb 12.8 oz (51.6 kg)   BMI 18.94 kg/m    Physical Exam Vitals and nursing note reviewed.  Constitutional:      Appearance: She is well-developed.  HENT:     Head: Normocephalic and atraumatic.  Eyes:     Conjunctiva/sclera: Conjunctivae normal.  Cardiovascular:     Rate and Rhythm: Normal rate and regular rhythm.     Heart sounds: Normal heart sounds.  Pulmonary:     Effort: Pulmonary effort is normal.     Breath sounds: Normal breath sounds.  Abdominal:     General: Bowel sounds are normal.     Palpations: Abdomen is soft.  Musculoskeletal:     Cervical back: Normal range of motion.  Lymphadenopathy:     Cervical: No cervical adenopathy.  Skin:    General: Skin is warm and dry.     Capillary Refill: Capillary refill takes less than 2 seconds.  Neurological:     Mental Status: She is alert and oriented to person, place, and time.  Psychiatric:        Behavior: Behavior normal.      Musculoskeletal Exam: C-spine was in good range of motion.  Thoracic and lumbar spine were in good range of motion.   She had no tenderness over SI joints.  Shoulder joints, elbow joints, wrist joints with good range of motion.  She had bilateral PIP and DIP thickening with no synovitis.  Hip joints and knee joints with good range of motion.  She had no tenderness over ankles, MTPs PIPs and DIPs.  CDAI Exam: CDAI Score: -- Patient Global: --; Provider Global: -- Swollen: --; Tender: -- Joint Exam 07/31/2021   No joint exam has been documented for this visit   There is currently no information documented on the homunculus. Go to the Rheumatology activity and complete the homunculus joint exam.  Investigation: No additional findings.  Imaging: No results found.  Recent Labs: Lab Results  Component Value Date   WBC 7.1 02/27/2021   HGB 12.7 02/27/2021   PLT 320 02/27/2021   NA 139 02/27/2021   K 4.5 02/27/2021   CL 105 02/27/2021   CO2 29 02/27/2021   GLUCOSE 72 02/27/2021   BUN 5 (L) 02/27/2021   CREATININE 0.66 02/27/2021   BILITOT 0.4 02/27/2021   ALKPHOS 83 08/30/2017   AST 15 02/27/2021   ALT 13 02/27/2021   PROT 7.1 02/27/2021   ALBUMIN 4.5 08/30/2017   CALCIUM 10.0 02/27/2021   GFRAA 113 04/02/2020   QFTBGOLD Negative 08/02/2015   QFTBGOLDPLUS NEGATIVE 02/27/2021    Speciality Comments: Prior therapy: Remicade and Humira (inadequate response) Osteoporosis managed by PCP. Last DXA August 2019.  Procedures:  No procedures performed Allergies: Codeine, Other, Penicillins, and Gluten meal   Assessment / Plan:     Visit Diagnoses: Psoriatic arthritis (HCC)-she has severe psoriatic arthritis but no active synovitis now.  She is doing well on the combination of Cosentyx and methotrexate.  She denies any recent flare.  Psoriasis-she had no active psoriasis lesions today.  High risk medication use - Cosentyx 150 mg every 14 days (started September 2019), methotrexate 1 mL every 7 days, and folic acid 1 mg 2 tablets daily. Inadequate response to remicade.  - Plan: CBC with  Differential/Platelet, COMPLETE METABOLIC PANEL WITH GFR today and every 3 months to monitor for drug toxicity.  TB gold was negative on February 27, 2021.  She  was advised to stop Cosentyx and methotrexate if she develops an infection and resume after the infection resolves.  Information about immunization was placed in the AVS.  Sicca complex (HCC)-she continues to have dry mouth and dry eyes.  Over-the-counter products were discussed at length.  Primary osteoarthritis of both hands-she has bilateral PIP and DIP thickening.  Joint protection muscle strengthening was discussed.  DDD (degenerative disc disease), lumbar-she has off-and-on discomfort in the lower back.  Core strengthening exercises were discussed.  Age-related osteoporosis without current pathological fracture - DEXA on 09/20/2018  1/3 distal radius BMD 0.381 with T-score -5.2.  She is currently on Tymlos prescribed by Dr. Redmond School.  She had repeat DEXA scan on April 24, 2021 according to the report T score was -3.8 in the left radius.  History of gastroesophageal reflux (GERD)  History of hypertension-blood pressure was elevated today.  Patient states that she will monitor blood pressure at home.  She did not take her blood pressure medication this morning.  I advised her to monitor her blood pressure closely and take the readings to her PCP.  History of hypothyroidism  History of colonic polyps  Smoker-smoking cessation was discussed.  Orders: Orders Placed This Encounter  Procedures   CBC with Differential/Platelet   COMPLETE METABOLIC PANEL WITH GFR   No orders of the defined types were placed in this encounter.    Follow-Up Instructions: Return in about 5 months (around 12/31/2021) for Psoriatic arthritis.   Bo Merino, MD  Note - This record has been created using Editor, commissioning.  Chart creation errors have been sought, but may not always  have been located. Such creation errors do not reflect on  the  standard of medical care.

## 2021-07-28 NOTE — Telephone Encounter (Signed)
Returned patients call  Pt returned my call week of 7/2

## 2021-07-31 ENCOUNTER — Encounter: Payer: Self-pay | Admitting: Rheumatology

## 2021-07-31 ENCOUNTER — Ambulatory Visit: Payer: Medicare Other | Admitting: Rheumatology

## 2021-07-31 VITALS — BP 192/97 | HR 77 | Ht 65.0 in | Wt 113.8 lb

## 2021-07-31 DIAGNOSIS — L4050 Arthropathic psoriasis, unspecified: Secondary | ICD-10-CM

## 2021-07-31 DIAGNOSIS — Z8719 Personal history of other diseases of the digestive system: Secondary | ICD-10-CM | POA: Diagnosis not present

## 2021-07-31 DIAGNOSIS — M3500 Sicca syndrome, unspecified: Secondary | ICD-10-CM

## 2021-07-31 DIAGNOSIS — F17200 Nicotine dependence, unspecified, uncomplicated: Secondary | ICD-10-CM

## 2021-07-31 DIAGNOSIS — Z86010 Personal history of colonic polyps: Secondary | ICD-10-CM

## 2021-07-31 DIAGNOSIS — M19041 Primary osteoarthritis, right hand: Secondary | ICD-10-CM | POA: Diagnosis not present

## 2021-07-31 DIAGNOSIS — M5136 Other intervertebral disc degeneration, lumbar region: Secondary | ICD-10-CM

## 2021-07-31 DIAGNOSIS — Z8639 Personal history of other endocrine, nutritional and metabolic disease: Secondary | ICD-10-CM

## 2021-07-31 DIAGNOSIS — M810 Age-related osteoporosis without current pathological fracture: Secondary | ICD-10-CM | POA: Diagnosis not present

## 2021-07-31 DIAGNOSIS — M7061 Trochanteric bursitis, right hip: Secondary | ICD-10-CM

## 2021-07-31 DIAGNOSIS — L409 Psoriasis, unspecified: Secondary | ICD-10-CM | POA: Diagnosis not present

## 2021-07-31 DIAGNOSIS — Z8679 Personal history of other diseases of the circulatory system: Secondary | ICD-10-CM

## 2021-07-31 DIAGNOSIS — M19042 Primary osteoarthritis, left hand: Secondary | ICD-10-CM

## 2021-07-31 DIAGNOSIS — Z79899 Other long term (current) drug therapy: Secondary | ICD-10-CM

## 2021-07-31 NOTE — Patient Instructions (Signed)
Standing Labs We placed an order today for your standing lab work.   Please have your standing labs drawn in October and every 3 months  If possible, please have your labs drawn 2 weeks prior to your appointment so that the provider can discuss your results at your appointment.  Please note that you may see your imaging and lab results in MyChart before we have reviewed them. We may be awaiting multiple results to interpret others before contacting you. Please allow our office up to 72 hours to thoroughly review all of the results before contacting the office for clarification of your results.  We have open lab daily: Monday through Thursday from 1:30-4:30 PM and Friday from 1:30-4:00 PM at the office of Dr. Lennyn Gange, [facility].   Please be advised, all patients with office appointments requiring lab work will take precedent over walk-in lab work.  If possible, please come for your lab work on Monday and Friday afternoons, as you may experience shorter wait times. The office is located at [address], [address], [place], [place] [address] No appointment is necessary.   Labs are drawn by [facility]. Please bring your co-pay at the time of your lab draw.  You may receive a bill from [facility] for your lab work.  Please note if you are on Hydroxychloroquine and and an order has been placed for a Hydroxychloroquine level, you will need to have it drawn 4 hours or more after your last dose.  If you wish to have your labs drawn at another location, please call the office 24 hours in advance to send orders.  If you have any questions regarding directions or hours of operation,  please call [phone].   As a reminder, please drink plenty of water prior to coming for your lab work. Thanks!   Vaccines You are taking a medication(s) that can suppress your immune system.  The following immunizations are recommended: Flu annually Covid-19  Td/Tdap (tetanus, diphtheria,  pertussis) every 10 years Pneumonia (Prevnar 15 then Pneumovax 23 at least 1 year apart.  Alternatively, can take Prevnar 20 without needing additional dose) Shingrix: 2 doses from 4 weeks to 6 months apart  Please check with your PCP to make sure you are up to date.   If you have signs or symptoms of an infection or start antibiotics: First, call your PCP for workup of your infection. Hold your medication through the infection, until you complete your antibiotics, and until symptoms resolve if you take the following: Injectable medication (Actemra, Benlysta, Cimzia, Cosentyx, Enbrel, Humira, Kevzara, Orencia, Remicade, Simponi, Stelara, Taltz, Tremfya) Methotrexate Leflunomide (Arava) Mycophenolate (Cellcept) Xeljanz, Olumiant, or Rinvoq  

## 2021-08-01 ENCOUNTER — Ambulatory Visit (INDEPENDENT_AMBULATORY_CARE_PROVIDER_SITE_OTHER): Payer: Medicare Other

## 2021-08-01 VITALS — Ht 65.0 in | Wt 114.8 lb

## 2021-08-01 DIAGNOSIS — Z0000 Encounter for general adult medical examination without abnormal findings: Secondary | ICD-10-CM

## 2021-08-01 LAB — CBC WITH DIFFERENTIAL/PLATELET
Absolute Monocytes: 336 cells/uL (ref 200–950)
Basophils Absolute: 12 cells/uL (ref 0–200)
Basophils Relative: 0.2 %
Eosinophils Absolute: 43 cells/uL (ref 15–500)
Eosinophils Relative: 0.7 %
HCT: 37.7 % (ref 35.0–45.0)
Hemoglobin: 12.8 g/dL (ref 11.7–15.5)
Lymphs Abs: 2355 cells/uL (ref 850–3900)
MCH: 33.2 pg — ABNORMAL HIGH (ref 27.0–33.0)
MCHC: 34 g/dL (ref 32.0–36.0)
MCV: 97.7 fL (ref 80.0–100.0)
MPV: 10 fL (ref 7.5–12.5)
Monocytes Relative: 5.5 %
Neutro Abs: 3355 cells/uL (ref 1500–7800)
Neutrophils Relative %: 55 %
Platelets: 351 10*3/uL (ref 140–400)
RBC: 3.86 10*6/uL (ref 3.80–5.10)
RDW: 12.4 % (ref 11.0–15.0)
Total Lymphocyte: 38.6 %
WBC: 6.1 10*3/uL (ref 3.8–10.8)

## 2021-08-01 LAB — COMPLETE METABOLIC PANEL WITH GFR
AG Ratio: 1.7 (calc) (ref 1.0–2.5)
ALT: 16 U/L (ref 6–29)
AST: 21 U/L (ref 10–35)
Albumin: 4.3 g/dL (ref 3.6–5.1)
Alkaline phosphatase (APISO): 72 U/L (ref 37–153)
BUN/Creatinine Ratio: 10 (calc) (ref 6–22)
BUN: 6 mg/dL — ABNORMAL LOW (ref 7–25)
CO2: 27 mmol/L (ref 20–32)
Calcium: 9.5 mg/dL (ref 8.6–10.4)
Chloride: 104 mmol/L (ref 98–110)
Creat: 0.58 mg/dL (ref 0.50–1.05)
Globulin: 2.6 g/dL (calc) (ref 1.9–3.7)
Glucose, Bld: 91 mg/dL (ref 65–99)
Potassium: 4.7 mmol/L (ref 3.5–5.3)
Sodium: 138 mmol/L (ref 135–146)
Total Bilirubin: 0.5 mg/dL (ref 0.2–1.2)
Total Protein: 6.9 g/dL (ref 6.1–8.1)
eGFR: 99 mL/min/{1.73_m2} (ref >=60)

## 2021-08-01 NOTE — Progress Notes (Signed)
CBC and CMP are normal.

## 2021-08-01 NOTE — Progress Notes (Signed)
I connected with April Cox today by telephone and verified that I am speaking with the correct person using two identifiers. Location patient: home Location provider: work Persons participating in the virtual visit: April Cox, Brocks LPN.   I discussed the limitations, risks, security and privacy concerns of performing an evaluation and management service by telephone and the availability of in person appointments. I also discussed with the patient that there may be a patient responsible charge related to this service. The patient expressed understanding and verbally consented to this telephonic visit.    Interactive audio and video telecommunications were attempted between this provider and patient, however failed, due to patient having technical difficulties OR patient did not have access to video capability.  We continued and completed visit with audio only.     Vital signs may be patient reported or missing.  Subjective:   April Cox is a 66 y.o. female who presents for an Initial Medicare Annual Wellness Visit.  Review of Systems     Cardiac Risk Factors include: advanced age (>72mn, >>31women);dyslipidemia;hypertension     Objective:    Today's Vitals   08/01/21 0931 08/01/21 0933  Weight: 114 lb 12.8 oz (52.1 kg)   Height: '5\' 5"'$  (1.651 m)   PainSc:  4    Body mass index is 19.1 kg/m.     08/01/2021    9:38 AM 06/28/2020    9:37 AM 09/19/2017   11:11 AM 09/15/2017    2:32 AM 12/02/2016    9:12 PM 04/18/2016    9:48 PM 07/29/2015   11:26 PM  Advanced Directives  Does Patient Have a Medical Advance Directive? Yes Yes No No No No No  Type of AParamedicof AEldredLiving will Living will;Healthcare Power of Attorney       Does patient want to make changes to medical advance directive?  No - Patient declined       Copy of [place] Chart? Yes - validated most recent copy scanned in chart (See row information)  Yes - validated most recent copy scanned in chart (See row information)       Would patient like information on creating a medical advance directive?   No - Patient declined  No - Patient declined  No - patient declined information    Current Medications (verified) Outpatient Encounter Medications as of 08/01/2021  Medication Sig   atorvastatin (LIPITOR) 20 MG tablet TAKE ONE TABLET BY MOUTH DAILY   calcium-vitamin D (OSCAL WITH D) 500-200 MG-UNIT per tablet Take 1 tablet by mouth 2 (two) times daily.   Cholecalciferol (VITAMIN D PO) Take 500 mg by mouth daily.    clobetasol ointment (TEMOVATE) 08.67% Apply 1 application topically as needed (psiorisis).   EPINEPHRINE 0.3 mg/0.3 mL IJ SOAJ injection INJECT INTO THE MIDDLE OF THE OUTER THIGH AND HOLD FOR 3 SECONDS AS NEEDED FOR SEVERE ALLERGIC REACTION, THEN CALL [id number] USED   folic acid (FOLVITE) 1 MG tablet TAKE TWO TABLETS BY MOUTH DAILY   levothyroxine (SYNTHROID) 25 MCG tablet TAKE ONE TABLET BY MOUTH DAILY BEFORE BREAKFAST   methotrexate 250 MG/10ML injection INJECT 1 MILLILITER (CC) UNDER THE SKIN ONCE A WEEK   metoprolol succinate (TOPROL-XL) 50 MG 24 hr tablet TAKE ONE TABLET BY MOUTH DAILY IMMEDIATELY FOLLOWING A MEAL   Multiple Vitamins-Minerals (OCUVITE PO) Take 1 tablet by mouth 2 (two) times daily.   pantoprazole (PROTONIX) 40 MG tablet TAKE ONE TABLET BY MOUTH  DAILY   Polyvinyl Alcohol-Povidone (REFRESH OP) Apply to eye daily.   Secukinumab, 300 MG Dose, (COSENTYX SENSOREADY, 300 MG,) 150 MG/ML SOAJ INJECT 1 PEN UNDER THE SKIN (SUBCUTANEOUS INJECTION) EVERY 14 DAYS   TUBERCULIN SYR 1CC/27GX1/2" (MONOJECT 1CC TB SYR 27GX1/2") 27G X 1/2" 1 ML MISC USE AS DIRECTED   vitamin C (ASCORBIC ACID) 250 MG tablet Take 250 mg by mouth daily.   XIIDRA 5 % SOLN 2 (two) times daily.   Abaloparatide (TYMLOS) 3120 MCG/1.56ML SOPN Inject 80 mcg into the skin daily. (Patient not taking: Reported on 07/31/2021)   clindamycin (CLEOCIN) 300 MG capsule  Take 1 capsule (300 mg total) by mouth 3 (three) times daily. (Patient not taking: No sig reported)   COPPER PO Take by mouth. (Patient not taking: No sig reported)   melatonin 5 MG TABS Take 5 mg by mouth. (Patient not taking: Reported on 07/31/2021)   No facility-administered encounter medications on file as of 08/01/2021.    Allergies (verified) Codeine, Other, Penicillins, and Gluten meal   History: Past Medical History:  Diagnosis Date   Abnormal Pap smear of cervix    Anemia    history of anemia   Arthritis    Cataract    Diverticulosis of colon    Family history of adverse reaction to anesthesia    mother PONV   GERD (gastroesophageal reflux disease)    Heart murmur    asymptomatic per pt   History of adenomatous polyp of colon    11-06-2009  tubullar   HPV in female    Hyperlipidemia    Hypertension    Hypothyroidism    Mild aortic valve stenosis    per echo 10-09-2010  ef 60-65%  valva area 1.48cm   Osteoporosis    Psoriasis    Psoriatic arthritis ([place])    Dr. Estanislado Pandy   Wears glasses    Past Surgical History:  Procedure Laterality Date   ABDOMINAL HYSTERECTOMY     ANTERIOR LAT LUMBAR FUSION  12/25/2011   Procedure: ANTERIOR LATERAL LUMBAR FUSION 1 LEVEL;  Surgeon: Eustace Moore, MD;  Location: [place] [facility];  Service: Neurosurgery;  Laterality: Left;  LUMBAR FOUR-FIVE   BLADDER SUSPENSION N/A 12/19/2014   Procedure: TRANSVAGINAL TAPE (TVT) PROCEDURE;  Surgeon: Everett Graff, MD;  Location: [place];  Service: Gynecology;  Laterality: N/A;   CARPAL TUNNEL RELEASE Right 04/2013   CATARACT EXTRACTION, BILATERAL     05/2020, 06/2020   CERVICAL CONIZATION W/BX N/A 08/28/2014   Procedure: CONIZATION CERVIX WITH BIOPSY;  Surgeon: Ena Dawley, MD;  Location: [facility];  Service: Gynecology;  Laterality: N/A;   COLONOSCOPY  11/06/2009   COLONOSCOPY  08/2019   CYSTO N/A 12/19/2014   Procedure: Kathrene Alu;  Surgeon: Everett Graff, MD;  Location: [place] [facility];   Service: Gynecology;  Laterality: N/A;   DENTAL SURGERY     HYSTEROSCOPY WITH D & C  04/04/2008   KNEE ARTHROPLASTY     LUMBAR PERCUTANEOUS PEDICLE SCREW 1 LEVEL  12/25/2011   Procedure: LUMBAR PERCUTANEOUS PEDICLE SCREW 1 LEVEL;  Surgeon: Eustace Moore, MD;  Location: [place] [facility];  Service: Neurosurgery;  Laterality: Left;  LUMBAR FOUR-FIVE   PERONEAL NERVE DECOMPRESSION Left 12/27/2009   for neuropathy with  foot drop   POLYPECTOMY     TRANSTHORACIC ECHOCARDIOGRAM  10/09/2010   mild LVH,  ef 60-65%,  grade I diastolic dysfunction,  very mild AV stenosis (area 1.48cm/S/2(VTI) with no regurg./  mild MV calcification without stenosis  or regur. /  trivial TR   VAGINAL HYSTERECTOMY Bilateral 12/19/2014   Procedure: TOTAL VAGINAL HYSTERECTOMY WITH BILATERAL SALPINGECTOMY;  Surgeon: Ena Dawley, MD;  Location: [place] [facility];  Service: Gynecology;  Laterality: Bilateral;   Family History  Problem Relation Age of Onset   Arthritis Mother        OA    Psoriasis Mother    Other Father        accidental death/MVA at age 22   Hypertension Brother    Other Brother        1 brother drowned   Cancer Brother    Esophageal cancer Brother    Brain cancer Brother    Hypertension Maternal Grandmother    Diabetes Maternal Grandmother    Hypertension Maternal Grandfather    Heart disease Neg Hx    Colon cancer Neg Hx    Colon polyps Neg Hx    Stomach cancer Neg Hx    Social History   Socioeconomic History   Marital status: Married    Spouse name: Not on file   Number of children: Not on file   Years of education: Not on file   Highest education level: Not on file  Occupational History   Not on file  Tobacco Use   Smoking status: Some Days    Packs/day: 0.25    Years: 16.00    Total pack years: 4.00    Types: Cigarettes    Passive exposure: Current   Smokeless tobacco: Never   Tobacco comments:    3-4 cigs / day 08/03/19  Vaping Use   Vaping Use: Never used  Substance and Sexual  Activity   Alcohol use: Yes    Comment: OCCASIONAL   Drug use: Never   Sexual activity: Yes  Other Topics Concern   Not on file  Social History Narrative   Not on file   Social Determinants of Health   Financial Resource Strain: Low Risk  (08/01/2021)   Overall Financial Resource Strain (CARDIA)    Difficulty of Paying Living Expenses: Not hard at all  Food Insecurity: No Food Insecurity (08/01/2021)   Hunger Vital Sign    Worried About Running Out of Food in the Last Year: Never true    [place] in the Last Year: Never true  Transportation Needs: No Transportation Needs (08/01/2021)   PRAPARE - Hydrologist (Medical): No    Lack of Transportation (Non-Medical): No  Physical Activity: Insufficiently Active (08/01/2021)   Exercise Vital Sign    Days of Exercise per Week: 3 days    Minutes of Exercise per Session: 30 min  Stress: No Stress Concern Present (08/01/2021)   [place]    Feeling of Stress : Not at all  Social Connections: Not on file    Tobacco Counseling Ready to quit: Yes Counseling given: Not Answered Tobacco comments: 3-4 cigs / day 08/03/19   Clinical Intake:  Pre-visit preparation completed: Yes  Pain : 0-10 Pain Score: 4  Pain Type: Chronic pain Pain Location: Other (Comment) (thumb) Pain Orientation: Right Pain Descriptors / Indicators: Aching Pain Onset: More than a month ago Pain Frequency: Constant     Nutritional Status: BMI of 19-24  Normal Nutritional Risks: None Diabetes: No  How often do you need to have someone help you when you read instructions, pamphlets, or other written materials from your doctor or pharmacy?: 1 - Never What is the  last grade level you completed in school?: college  Diabetic? no  Interpreter Needed?: No  Information entered by :: NAllen LPN   Activities of Daily Living    08/01/2021    9:40 AM 07/31/2021    10:43 AM  In your present state of health, do you have any difficulty performing the following activities:  Hearing? 0 0  Vision? 0 0  Difficulty concentrating or making decisions? 0 0  Walking or climbing stairs? 0 0  Dressing or bathing? 0 0  Doing errands, shopping? 0 0  Preparing Food and eating ? N N  Using the Toilet? N N  In the past six months, have you accidently leaked urine? Y Y  Comment occasionally   Do you have problems with loss of bowel control? N N  Managing your Medications? N N  Managing your Finances? N N  Housekeeping or managing your Housekeeping? N N    Patient Care Team: Denita Lung, MD as PCP - General (Family Medicine)  Indicate any recent Medical Services you may have received from other than [facility] providers in the past year (date may be approximate).     Assessment:   This is a routine wellness examination for [facility].  Hearing/Vision screen Vision Screening - Comments:: Regular eye exams, [facility]  Dietary issues and exercise activities discussed: Current Exercise Habits: Home exercise routine, Type of exercise: walking, Time (Minutes): 30, Frequency (Times/Week): 3, Weekly Exercise (Minutes/Week): 90   Goals Addressed             This Visit's Progress    Patient Stated       08/01/2021, wants to gain weight       Depression Screen    08/01/2021    9:40 AM 06/28/2020    9:38 AM 05/27/2020    4:11 PM 06/28/2019    8:45 AM 09/09/2016    2:16 PM 09/10/2014   11:37 AM  PHQ 2/9 Scores  PHQ - 2 Score 0 0 0 0 0 0  PHQ- 9 Score 0         Fall Risk    08/01/2021    9:39 AM 07/31/2021   10:43 AM 06/28/2020    9:37 AM 05/27/2020    4:11 PM 09/09/2016    2:16 PM  Fall Risk   Falls in the past year? 0 0 0 0 No  Number falls in past yr: 0 0 0 0   Injury with Fall? 0 0 0 0   Risk for fall due to : Medication side effect  No Fall Risks No Fall Risks   Follow up Falls evaluation completed;Education provided;Falls prevention discussed   Falls evaluation completed Falls evaluation completed     FALL RISK PREVENTION PERTAINING TO THE HOME:  Any stairs in or around the home? Yes  If so, are there any without handrails? No  Home free of loose throw rugs in walkways, pet beds, electrical cords, etc? Yes  Adequate lighting in your home to reduce risk of falls? Yes   ASSISTIVE DEVICES UTILIZED TO PREVENT FALLS:  Life alert? No  Use of a cane, walker or w/c? No  Grab bars in the bathroom? No  Shower chair or bench in shower? No  Elevated toilet seat or a handicapped toilet? Yes   TIMED UP AND GO:  Was the test performed? No .      Cognitive Function:        08/01/2021    9:42 AM  6CIT  Screen  What Year? 0 points  What month? 0 points  What time? 0 points  Count back from 20 0 points  Months in reverse 0 points  Repeat phrase 0 points  Total Score 0 points    Immunizations Immunization History  Administered Date(s) Administered   Fluad Quad(high Dose 65+) 10/11/2018   Influenza Split 12/19/2010, 12/01/2011   Influenza, High Dose Seasonal PF 11/16/2020   Influenza,inj,Quad PF,6+ Mos 01/21/2016   Influenza,inj,quad, With Preservative 12/04/2017   PFIZER(Purple Top)SARS-COV-2 Vaccination 02/27/2019, 03/24/2019, 10/02/2019, 05/23/2020   Pfizer Covid-19 Vaccine Bivalent Booster 19yr & up 10/23/2020   Pneumococcal Conjugate-13 01/20/2014   Pneumococcal Polysaccharide-23 08/01/2010   Tdap 01/21/2016, 09/15/2017   Zoster Recombinat (Shingrix) 12/11/2017, 02/27/2018    TDAP status: Up to date  Flu Vaccine status: Up to date  Pneumococcal vaccine status: Up to date  Covid-19 vaccine status: Completed vaccines  Qualifies for Shingles Vaccine? Yes   Zostavax completed Yes   Shingrix Completed?: Yes  Screening Tests Health Maintenance  Topic Date Due   INFLUENZA VACCINE  08/19/2021   COLONOSCOPY (Pts 45-47yrInsurance coverage will need to be confirmed)  08/21/2022   MAMMOGRAM  12/11/2022    TETANUS/TDAP  09/16/2027   DEXA SCAN  Completed   COVID-19 Vaccine  Completed   Hepatitis C Screening  Completed   Zoster Vaccines- Shingrix  Completed   HPV VACCINES  Aged Out   Pneumonia Vaccine 6549Years old  Discontinued    Health Maintenance  There are no preventive care reminders to display for this patient.  Colorectal cancer screening: Type of screening: Colonoscopy. Completed 08/21/2019. Repeat every 3 years  Mammogram status: Completed 12/10/2020. Repeat every year  Bone Density status: Completed 04/24/2021.   Lung Cancer Screening: (Low Dose CT Chest recommended if Age 68-80ears, 30 pack-year currently smoking OR have quit w/in 15years.) does not qualify.   Lung Cancer Screening Referral: no  Additional Screening:  Hepatitis C Screening: does qualify; Completed 10/05/2017  Vision Screening: Recommended annual ophthalmology exams for early detection of glaucoma and other disorders of the eye. Is the patient up to date with their annual eye exam?  Yes  Who is the provider or what is the name of the office in which the patient attends annual eye exams? [facility] with [facility] provider, would they like to be referred to a provider to establish care? No .   Dental Screening: Recommended annual dental exams for proper oral hygiene  Community Resource Referral / Chronic Care Management: CRR required this visit?  No   CCM required this visit?  No      Plan:     I have personally reviewed and noted the following in the patient's chart:   Medical and social history Use of alcohol, tobacco or illicit drugs  Current medications and supplements including opioid prescriptions. Patient is not currently taking opioid prescriptions. Functional ability and status Nutritional status Physical activity Advanced directives List of other physicians Hospitalizations, surgeries, and ER visits in previous 12 months Vitals Screenings to include cognitive,  depression, and falls Referrals and appointments  In addition, I have reviewed and discussed with patient certain preventive protocols, quality metrics, and best practice recommendations. A written personalized care plan for preventive services as well as general preventive health recommendations were provided to patient.     NiKellie SimmeringLPN   07/24/71/2202 Nurse Notes: none  Due to this being a virtual visit, the after visit  summary with patients personalized plan was offered to patient via mail or my-chart. Patient would like to access on my-chart

## 2021-08-01 NOTE — Patient Instructions (Signed)
[name] , Thank you for taking time to come for your Medicare Wellness Visit. I appreciate your ongoing commitment to your health goals. Please review the following plan we discussed and let me know if I can assist you in the future.   Screening recommendations/referrals: Colonoscopy: completed 08/21/2019, due 08/21/2022 Mammogram: completed 12/10/2020, due 12/11/2021 Bone Density: completed 04/24/2021 Recommended yearly ophthalmology/optometry visit for glaucoma screening and checkup Recommended yearly dental visit for hygiene and checkup  Vaccinations: Influenza vaccine: due 08/19/2021 Pneumococcal vaccine: completed 01/20/2014 Tdap vaccine: completed 09/15/2017, due 09/16/2027 Shingles vaccine: completed   Covid-19: 10/23/2020, 05/23/2020, 10/02/2019, 03/24/2019, 02/27/2019  Advanced directives: copy in chart  Conditions/risks identified: smoking  Next appointment: Follow up in one year for your annual wellness visit    Preventive Care 58 Years and Older, Female Preventive care refers to lifestyle choices and visits with your health care provider that can promote health and wellness. What does preventive care include? A yearly physical exam. This is also called an annual well check. Dental exams once or twice a year. Routine eye exams. Ask your health care provider how often you should have your eyes checked. Personal lifestyle choices, including: Daily care of your teeth and gums. Regular physical activity. Eating a healthy diet. Avoiding tobacco and drug use. Limiting alcohol use. Practicing safe sex. Taking low-dose aspirin every day. Taking vitamin and mineral supplements as recommended by your health care provider. What happens during an annual well check? The services and screenings done by your health care provider during your annual well check will depend on your age, overall health, lifestyle risk factors, and family history of disease. Counseling  Your health care provider may  ask you questions about your: Alcohol use. Tobacco use. Drug use. Emotional well-being. Home and relationship well-being. Sexual activity. Eating habits. History of falls. Memory and ability to understand (cognition). Work and work Statistician. Reproductive health. Screening  You may have the following tests or measurements: Height, weight, and BMI. Blood pressure. Lipid and cholesterol levels. These may be checked every 5 years, or more frequently if you are over 102 years old. Skin check. Lung cancer screening. You may have this screening every year starting at age 68 if you have a 30-pack-year history of smoking and currently smoke or have quit within the past 15 years. Fecal occult blood test (FOBT) of the stool. You may have this test every year starting at age 68. Flexible sigmoidoscopy or colonoscopy. You may have a sigmoidoscopy every 5 years or a colonoscopy every 10 years starting at age 68. Hepatitis C blood test. Hepatitis B blood test. Sexually transmitted disease (STD) testing. Diabetes screening. This is done by checking your blood sugar (glucose) after you have not eaten for a while (fasting). You may have this done every 1-3 years. Bone density scan. This is done to screen for osteoporosis. You may have this done starting at age 68. Mammogram. This may be done every 1-2 years. Talk to your health care provider about how often you should have regular mammograms. Talk with your health care provider about your test results, treatment options, and if necessary, the need for more tests. Vaccines  Your health care provider may recommend certain vaccines, such as: Influenza vaccine. This is recommended every year. Tetanus, diphtheria, and acellular pertussis (Tdap, Td) vaccine. You may need a Td booster every 10 years. Zoster vaccine. You may need this after age 68. Pneumococcal 13-valent conjugate (PCV13) vaccine. One dose is recommended after age 39. Pneumococcal  polysaccharide (PPSV23)  vaccine. One dose is recommended after age 28. Talk to your health care provider about which screenings and vaccines you need and how often you need them. This information is not intended to replace advice given to you by your health care provider. Make sure you discuss any questions you have with your health care provider. Document Released: 02/01/2015 Document Revised: 09/25/2015 Document Reviewed: 11/06/2014 Elsevier Interactive Patient Education  2017 [employer] Prevention in the Home Falls can cause injuries. They can happen to people of all ages. There are many things you can do to make your home safe and to help prevent falls. What can I do on the outside of my home? Regularly fix the edges of walkways and driveways and fix any cracks. Remove anything that might make you trip as you walk through a door, such as a raised step or threshold. Trim any bushes or trees on the path to your home. Use bright outdoor lighting. Clear any walking paths of anything that might make someone trip, such as rocks or tools. Regularly check to see if handrails are loose or broken. Make sure that both sides of any steps have handrails. Any raised decks and porches should have guardrails on the edges. Have any leaves, snow, or ice cleared regularly. Use sand or salt on walking paths during winter. Clean up any spills in your garage right away. This includes oil or grease spills. What can I do in the bathroom? Use night lights. Install grab bars by the toilet and in the tub and shower. Do not use towel bars as grab bars. Use non-skid mats or decals in the tub or shower. If you need to sit down in the shower, use a plastic, non-slip stool. Keep the floor dry. Clean up any water that spills on the floor as soon as it happens. Remove soap buildup in the tub or shower regularly. Attach bath mats securely with double-sided non-slip rug tape. Do not have throw rugs and other  things on the floor that can make you trip. What can I do in the bedroom? Use night lights. Make sure that you have a light by your bed that is easy to reach. Do not use any sheets or blankets that are too big for your bed. They should not hang down onto the floor. Have a firm chair that has side arms. You can use this for support while you get dressed. Do not have throw rugs and other things on the floor that can make you trip. What can I do in the kitchen? Clean up any spills right away. Avoid walking on wet floors. Keep items that you use a lot in easy-to-reach places. If you need to reach something above you, use a strong step stool that has a grab bar. Keep electrical cords out of the way. Do not use floor polish or wax that makes floors slippery. If you must use wax, use non-skid floor wax. Do not have throw rugs and other things on the floor that can make you trip. What can I do with my stairs? Do not leave any items on the stairs. Make sure that there are handrails on both sides of the stairs and use them. Fix handrails that are broken or loose. Make sure that handrails are as long as the stairways. Check any carpeting to make sure that it is firmly attached to the stairs. Fix any carpet that is loose or worn. Avoid having throw rugs at the top or bottom of  the stairs. If you do have throw rugs, attach them to the floor with carpet tape. Make sure that you have a light switch at the top of the stairs and the bottom of the stairs. If you do not have them, ask someone to add them for you. What else can I do to help prevent falls? Wear shoes that: Do not have high heels. Have rubber bottoms. Are comfortable and fit you well. Are closed at the toe. Do not wear sandals. If you use a stepladder: Make sure that it is fully opened. Do not climb a closed stepladder. Make sure that both sides of the stepladder are locked into place. Ask someone to hold it for you, if possible. Clearly  mark and make sure that you can see: Any grab bars or handrails. First and last steps. Where the edge of each step is. Use tools that help you move around (mobility aids) if they are needed. These include: Canes. Walkers. Scooters. Crutches. Turn on the lights when you go into a dark area. Replace any light bulbs as soon as they burn out. Set up your furniture so you have a clear path. Avoid moving your furniture around. If any of your floors are uneven, fix them. If there are any pets around you, be aware of where they are. Review your medicines with your doctor. Some medicines can make you feel dizzy. This can increase your chance of falling. Ask your doctor what other things that you can do to help prevent falls. This information is not intended to replace advice given to you by your health care provider. Make sure you discuss any questions you have with your health care provider. Document Released: 11/01/2008 Document Revised: 06/13/2015 Document Reviewed: 02/09/2014 Elsevier Interactive Patient Education  2017 [employer].

## 2021-08-11 ENCOUNTER — Other Ambulatory Visit: Payer: Medicare Other

## 2021-08-11 DIAGNOSIS — M810 Age-related osteoporosis without current pathological fracture: Secondary | ICD-10-CM

## 2021-08-11 DIAGNOSIS — I10 Essential (primary) hypertension: Secondary | ICD-10-CM

## 2021-08-11 DIAGNOSIS — E785 Hyperlipidemia, unspecified: Secondary | ICD-10-CM | POA: Diagnosis not present

## 2021-08-11 DIAGNOSIS — Z8639 Personal history of other endocrine, nutritional and metabolic disease: Secondary | ICD-10-CM

## 2021-08-11 DIAGNOSIS — E038 Other specified hypothyroidism: Secondary | ICD-10-CM

## 2021-08-12 ENCOUNTER — Ambulatory Visit (INDEPENDENT_AMBULATORY_CARE_PROVIDER_SITE_OTHER): Payer: Medicare Other | Admitting: Family Medicine

## 2021-08-12 ENCOUNTER — Encounter: Payer: Self-pay | Admitting: Family Medicine

## 2021-08-12 VITALS — BP 184/102 | HR 83 | Temp 98.4°F | Ht 65.5 in | Wt 111.0 lb

## 2021-08-12 DIAGNOSIS — F17210 Nicotine dependence, cigarettes, uncomplicated: Secondary | ICD-10-CM | POA: Diagnosis not present

## 2021-08-12 DIAGNOSIS — M3500 Sicca syndrome, unspecified: Secondary | ICD-10-CM | POA: Diagnosis not present

## 2021-08-12 DIAGNOSIS — L409 Psoriasis, unspecified: Secondary | ICD-10-CM | POA: Diagnosis not present

## 2021-08-12 DIAGNOSIS — L4050 Arthropathic psoriasis, unspecified: Secondary | ICD-10-CM

## 2021-08-12 DIAGNOSIS — M810 Age-related osteoporosis without current pathological fracture: Secondary | ICD-10-CM | POA: Diagnosis not present

## 2021-08-12 DIAGNOSIS — I10 Essential (primary) hypertension: Secondary | ICD-10-CM | POA: Diagnosis not present

## 2021-08-12 DIAGNOSIS — Z860100 Personal history of colon polyps, unspecified: Secondary | ICD-10-CM

## 2021-08-12 DIAGNOSIS — K219 Gastro-esophageal reflux disease without esophagitis: Secondary | ICD-10-CM | POA: Diagnosis not present

## 2021-08-12 DIAGNOSIS — E785 Hyperlipidemia, unspecified: Secondary | ICD-10-CM | POA: Diagnosis not present

## 2021-08-12 DIAGNOSIS — I350 Nonrheumatic aortic (valve) stenosis: Secondary | ICD-10-CM

## 2021-08-12 DIAGNOSIS — Z79899 Other long term (current) drug therapy: Secondary | ICD-10-CM | POA: Diagnosis not present

## 2021-08-12 DIAGNOSIS — E038 Other specified hypothyroidism: Secondary | ICD-10-CM | POA: Diagnosis not present

## 2021-08-12 LAB — LIPID PANEL
Chol/HDL Ratio: 3.9 ratio (ref 0.0–4.4)
Cholesterol, Total: 265 mg/dL — ABNORMAL HIGH (ref 100–199)
HDL: 68 mg/dL (ref >39)
LDL Chol Calc (NIH): 173 mg/dL — ABNORMAL HIGH (ref 0–99)
Triglycerides: 135 mg/dL (ref 0–149)
VLDL Cholesterol Cal: 24 mg/dL (ref 5–40)

## 2021-08-12 LAB — CBC WITH DIFFERENTIAL/PLATELET
Basophils Absolute: 0 10*3/uL (ref 0.0–0.2)
Basos: 0 %
EOS (ABSOLUTE): 0.1 10*3/uL (ref 0.0–0.4)
Eos: 1 %
Hematocrit: 43.2 % (ref 34.0–46.6)
Hemoglobin: 13.7 g/dL (ref 11.1–15.9)
Immature Grans (Abs): 0 10*3/uL (ref 0.0–0.1)
Immature Granulocytes: 0 %
Lymphocytes Absolute: 2.2 10*3/uL (ref 0.7–3.1)
Lymphs: 23 %
MCH: 31.2 pg (ref 26.6–33.0)
MCHC: 31.7 g/dL (ref 31.5–35.7)
MCV: 98 fL — ABNORMAL HIGH (ref 79–97)
Monocytes Absolute: 0.5 10*3/uL (ref 0.1–0.9)
Monocytes: 6 %
Neutrophils Absolute: 6.5 10*3/uL (ref 1.4–7.0)
Neutrophils: 70 %
Platelets: 309 10*3/uL (ref 150–450)
RBC: 4.39 x10E6/uL (ref 3.77–5.28)
RDW: 12.3 % (ref 11.7–15.4)
WBC: 9.3 10*3/uL (ref 3.4–10.8)

## 2021-08-12 LAB — COMPREHENSIVE METABOLIC PANEL
ALT: 13 IU/L (ref 0–32)
AST: 19 IU/L (ref 0–40)
Albumin/Globulin Ratio: 1.7 (ref 1.2–2.2)
Albumin: 4.9 g/dL (ref 3.9–4.9)
Alkaline Phosphatase: 105 IU/L (ref 44–121)
BUN/Creatinine Ratio: 9 — ABNORMAL LOW (ref 12–28)
BUN: 6 mg/dL — ABNORMAL LOW (ref 8–27)
Bilirubin Total: 0.8 mg/dL (ref 0.0–1.2)
CO2: 26 mmol/L (ref 20–29)
Calcium: 10.4 mg/dL — ABNORMAL HIGH (ref 8.7–10.3)
Chloride: 99 mmol/L (ref 96–106)
Creatinine, Ser: 0.67 mg/dL (ref 0.57–1.00)
Globulin, Total: 2.9 g/dL (ref 1.5–4.5)
Glucose: 91 mg/dL (ref 70–99)
Potassium: 4.4 mmol/L (ref 3.5–5.2)
Sodium: 139 mmol/L (ref 134–144)
Total Protein: 7.8 g/dL (ref 6.0–8.5)
eGFR: 95 mL/min/{1.73_m2} (ref >59)

## 2021-08-12 LAB — TSH: TSH: 3.08 u[IU]/mL (ref 0.450–4.500)

## 2021-08-12 MED ORDER — LEVOTHYROXINE SODIUM 25 MCG PO TABS: ORAL_TABLET | ORAL | 3 refills | Status: DC

## 2021-08-12 MED ORDER — PANTOPRAZOLE SODIUM 40 MG PO TBEC: 40.0000 mg | DELAYED_RELEASE_TABLET | Freq: Every day | ORAL | 0 refills | Status: DC

## 2021-08-12 MED ORDER — METOPROLOL SUCCINATE ER 50 MG PO TB24: ORAL_TABLET | ORAL | 5 refills | Status: DC

## 2021-08-12 MED ORDER — ATORVASTATIN CALCIUM 40 MG PO TABS: 40.0000 mg | ORAL_TABLET | Freq: Every day | ORAL | 3 refills | Status: DC

## 2021-08-12 NOTE — Progress Notes (Signed)
Complete physical exam  Patient: April Cox   DOB: [birth year]   67 y.o. Female  MRN: [id number]  Subjective:       April Cox is a 66 y.o. female who presents today for a complete physical exam. She reports consuming a general diet.  Staying active at least 20 min a day.  She generally feels well. She reports sleeping well. She does have additional problems to discuss today.  She continues to be followed for her psoriatic arthritis and is doing quite well on her present medication regimen.  She uses Protonix on an as-needed basis for her reflux symptoms.  Continues on Lipitor.  She will is taking Synthroid without difficulty.  She is scheduled for repeat colonoscopy in 2025 she has cut back on her cigarette use.  Presently she is taking Tymlos.  In the past she had been on Fosamax but this was unsuccessful.  Her insurance denied Forteo and we placed her on Tymlos.  She also has evidence of AF but presently is having no chest pain, shortness of breath, syncopal episodes.   Most recent fall risk assessment:    08/01/2021    9:39 AM  Fall Risk   Falls in the past year? 0  Number falls in past yr: 0  Injury with Fall? 0  Risk for fall due to : Medication side effect  Follow up Falls evaluation completed;Education provided;Falls prevention discussed     Most recent depression screenings:    08/01/2021    9:40 AM 06/28/2020    9:38 AM  PHQ 2/9 Scores  PHQ - 2 Score 0 0  PHQ- 9 Score 0       Patient Active Problem List   Diagnosis Date Noted   Nonrheumatic aortic valve stenosis 08/12/2021   Sicca complex ([place]) 04/01/2017   DDD (degenerative disc disease), lumbar 04/01/2017   Psoriasis 04/01/2017   Dry eye 01/01/2017   Spondylosis of lumbar region without myelopathy or radiculopathy 01/07/2016   High risk medication use 01/07/2016   Essential hypertension 09/10/2014   History of HPV infection 09/10/2014   History of colonic polyps 09/10/2014   Osteoporosis 09/10/2014    Gastroesophageal reflux disease without esophagitis 09/10/2014   Cigarette smoker 05/11/2011   Hyperlipidemia 08/22/2010   Hypothyroidism 08/22/2010   Psoriatic arthritis ([place]) 08/22/2010   Past Medical History:  Diagnosis Date   Abnormal Pap smear of cervix    Anemia    history of anemia   Arthritis    Cataract    Diverticulosis of colon    Family history of adverse reaction to anesthesia    mother PONV   GERD (gastroesophageal reflux disease)    Heart murmur    asymptomatic per pt   History of adenomatous polyp of colon    11-06-2009  tubullar   HPV in female    Hyperlipidemia    Hypertension    Hypothyroidism    Mild aortic valve stenosis    per echo 10-09-2010  ef 60-65%  valva area 1.48cm   Osteoporosis    Psoriasis    Psoriatic arthritis ([place])    Dr. Estanislado Pandy   Wears glasses    Past Surgical History:  Procedure Laterality Date   ABDOMINAL HYSTERECTOMY     ANTERIOR LAT LUMBAR FUSION  12/25/2011   Procedure: ANTERIOR LATERAL LUMBAR FUSION 1 LEVEL;  Surgeon: Eustace Moore, MD;  Location: [facility];  Service: Neurosurgery;  Laterality: Left;  LUMBAR FOUR-FIVE   BLADDER SUSPENSION N/A 12/19/2014  Procedure: TRANSVAGINAL TAPE (TVT) PROCEDURE;  Surgeon: Everett Graff, MD;  Location: [facility];  Service: Gynecology;  Laterality: N/A;   CARPAL TUNNEL RELEASE Right 04/2013   CATARACT EXTRACTION, BILATERAL     05/2020, 06/2020   CERVICAL CONIZATION W/BX N/A 08/28/2014   Procedure: CONIZATION CERVIX WITH BIOPSY;  Surgeon: Ena Dawley, MD;  Location: [facility];  Service: Gynecology;  Laterality: N/A;   COLONOSCOPY  11/06/2009   COLONOSCOPY  08/2019   CYSTO N/A 12/19/2014   Procedure: Kathrene Alu;  Surgeon: Everett Graff, MD;  Location: [facility];  Service: Gynecology;  Laterality: N/A;   DENTAL SURGERY     HYSTEROSCOPY WITH D & C  04/04/2008   KNEE ARTHROPLASTY     LUMBAR PERCUTANEOUS PEDICLE SCREW 1 LEVEL  12/25/2011   Procedure: LUMBAR PERCUTANEOUS  PEDICLE SCREW 1 LEVEL;  Surgeon: Eustace Moore, MD;  Location: [facility];  Service: Neurosurgery;  Laterality: Left;  LUMBAR FOUR-FIVE   PERONEAL NERVE DECOMPRESSION Left 12/27/2009   for neuropathy with  foot drop   POLYPECTOMY     TRANSTHORACIC ECHOCARDIOGRAM  10/09/2010   mild LVH,  ef 60-65%,  grade I diastolic dysfunction,  very mild AV stenosis (area 1.48cm/S/2(VTI) with no regurg./  mild MV calcification without stenosis or regur. /  trivial TR   VAGINAL HYSTERECTOMY Bilateral 12/19/2014   Procedure: TOTAL VAGINAL HYSTERECTOMY WITH BILATERAL SALPINGECTOMY;  Surgeon: Ena Dawley, MD;  Location: [facility];  Service: Gynecology;  Laterality: Bilateral;   Social History   Tobacco Use   Smoking status: Some Days    Packs/day: 0.25    Years: 16.00    Total pack years: 4.00    Types: Cigarettes    Passive exposure: Current   Smokeless tobacco: Never   Tobacco comments:    3-4 cigs / day 08/03/19  Vaping Use   Vaping Use: Never used  Substance Use Topics   Alcohol use: Yes    Comment: OCCASIONAL   Drug use: Never   Family History  Problem Relation Age of Onset   Arthritis Mother        OA    Psoriasis Mother    Other Father        accidental death/MVA at age 96   Hypertension Brother    Other Brother        1 brother drowned   Cancer Brother    Esophageal cancer Brother    Brain cancer Brother    Hypertension Maternal Grandmother    Diabetes Maternal Grandmother    Hypertension Maternal Grandfather    Heart disease Neg Hx    Colon cancer Neg Hx    Colon polyps Neg Hx    Stomach cancer Neg Hx    Allergies  Allergen Reactions   Codeine Hives and Nausea And Vomiting    REACTION: severe    Other Anaphylaxis    Bee stings   Penicillins Shortness Of Breath, Swelling and Rash   Gluten Meal Swelling and Other (See Comments)    Inflammation  (this a trigger due to Psoriatic arthritis      Patient Care Team: Denita Lung, MD as PCP - General (Family Medicine)    Outpatient Medications Prior to Visit  Medication Sig   calcium-vitamin D (OSCAL WITH D) 500-200 MG-UNIT per tablet Take 1 tablet by mouth 2 (two) times daily.   Cholecalciferol (VITAMIN D PO) Take 500 mg by mouth daily.    clobetasol ointment (TEMOVATE) 0.96 % Apply 1 application topically as  needed (psiorisis).   EPINEPHRINE 0.3 mg/0.3 mL IJ SOAJ injection INJECT INTO THE MIDDLE OF THE OUTER THIGH AND HOLD FOR 3 SECONDS AS NEEDED FOR SEVERE ALLERGIC REACTION, THEN CALL 383 IF USED   folic acid (FOLVITE) 1 MG tablet TAKE TWO TABLETS BY MOUTH DAILY   methotrexate 250 MG/10ML injection INJECT 1 MILLILITER (CC) UNDER THE SKIN ONCE A WEEK   Multiple Vitamins-Minerals (OCUVITE PO) Take 1 tablet by mouth 2 (two) times daily.   Polyvinyl Alcohol-Povidone (REFRESH OP) Apply to eye daily.   Secukinumab, 300 MG Dose, (COSENTYX SENSOREADY, 300 MG,) 150 MG/ML SOAJ INJECT 1 PEN UNDER THE SKIN (SUBCUTANEOUS INJECTION) EVERY 14 DAYS   TUBERCULIN SYR 1CC/27GX1/2" (MONOJECT 1CC TB SYR 27GX1/2") 27G X 1/2" 1 ML MISC USE AS DIRECTED   vitamin C (ASCORBIC ACID) 250 MG tablet Take 250 mg by mouth daily.   XIIDRA 5 % SOLN 2 (two) times daily.   [DISCONTINUED] atorvastatin (LIPITOR) 20 MG tablet TAKE ONE TABLET BY MOUTH DAILY   [DISCONTINUED] levothyroxine (SYNTHROID) 25 MCG tablet TAKE ONE TABLET BY MOUTH DAILY BEFORE BREAKFAST   [DISCONTINUED] metoprolol succinate (TOPROL-XL) 50 MG 24 hr tablet TAKE ONE TABLET BY MOUTH DAILY IMMEDIATELY FOLLOWING A MEAL   [DISCONTINUED] pantoprazole (PROTONIX) 40 MG tablet TAKE ONE TABLET BY MOUTH DAILY   Abaloparatide (TYMLOS) 3120 MCG/1.56ML SOPN Inject 80 mcg into the skin daily. (Patient not taking: Reported on 07/31/2021)   melatonin 5 MG TABS Take 5 mg by mouth. (Patient not taking: Reported on 07/31/2021)   [DISCONTINUED] clindamycin (CLEOCIN) 300 MG capsule Take 1 capsule (300 mg total) by mouth 3 (three) times daily. (Patient not taking: No sig reported)   [DISCONTINUED]  COPPER PO Take by mouth. (Patient not taking: No sig reported)   No facility-administered medications prior to visit.    Review of Systems  All other systems reviewed and are negative.         Objective:  Alert and in no distress. Tympanic membranes and canals are normal. Pharyngeal area is normal. Neck is supple without adenopathy or thyromegaly. Cardiac exam shows a regular sinus rhythm with a grade 1 systolic murmur. Lungs are clear to auscultation.    BP (!) 184/102   Pulse 83   Temp 98.4 F (36.9 C)   Ht 5' 5.5" (1.664 m)   Wt 111 lb (50.3 kg)   SpO2 98%   BMI 18.19 kg/m  BP Readings from Last 3 Encounters:  08/12/21 (!) 184/102  07/31/21 (!) 192/97  02/27/21 (!) 180/78   Wt Readings from Last 3 Encounters:  08/12/21 111 lb (50.3 kg)  08/01/21 114 lb 12.8 oz (52.1 kg)  07/31/21 113 lb 12.8 oz (51.6 kg)      Physical Exam  Alert and in no distress. Tympanic membranes and canals are normal. Pharyngeal area is normal. Neck is supple without adenopathy or thyromegaly. Cardiac exam shows a regular sinus rhythm without murmurs or gallops. Lungs are clear to auscultation.  Last CBC Lab Results  Component Value Date   WBC 9.3 08/11/2021   HGB 13.7 08/11/2021   HCT 43.2 08/11/2021   MCV 98 (H) 08/11/2021   MCH 31.2 08/11/2021   RDW 12.3 08/11/2021   PLT 309 29/19/1660   Last metabolic panel Lab Results  Component Value Date   GLUCOSE 91 08/11/2021   NA 139 08/11/2021   K 4.4 08/11/2021   CL 99 08/11/2021   CO2 26 08/11/2021   BUN 6 (L) 08/11/2021   CREATININE 0.67 08/11/2021  EGFR 95 08/11/2021   CALCIUM 10.4 (H) 08/11/2021   PROT 7.8 08/11/2021   ALBUMIN 4.9 08/11/2021   LABGLOB 2.9 08/11/2021   AGRATIO 1.7 08/11/2021   BILITOT 0.8 08/11/2021   ALKPHOS 105 08/11/2021   AST 19 08/11/2021   ALT 13 08/11/2021   ANIONGAP 8 04/10/2016   Last lipids Lab Results  Component Value Date   CHOL 265 (H) 08/11/2021   HDL 68 08/11/2021   LDLCALC 173 (H)  08/11/2021   TRIG 135 08/11/2021   CHOLHDL 3.9 08/11/2021   Last thyroid functions Lab Results  Component Value Date   TSH 3.080 08/11/2021   T4TOTAL 9.2 07/16/2011  Recent DEXA scan showed a T score of -3.8.       Assessment & Plan:   Essential hypertension - Plan: metoprolol succinate (TOPROL-XL) 50 MG 24 hr tablet  Gastroesophageal reflux disease without esophagitis - Plan: pantoprazole (PROTONIX) 40 MG tablet  Sicca complex ([place])  Other specified hypothyroidism - Plan: levothyroxine (SYNTHROID) 25 MCG tablet  Age-related osteoporosis without current pathological fracture  Psoriatic arthritis (H[facility])  Psoriasis  Cigarette smoker  High risk medication use  History of colonic polyps  Hyperlipidemia, unspecified hyperlipidemia type - Plan: atorvastatin (LIPITOR) 40 MG tablet  Nonrheumatic aortic valve stenosis   Immunization History  Administered Date(s) Administered   Fluad Quad(high Dose 65+) 10/11/2018   Influenza Split 12/19/2010, 12/01/2011   Influenza, High Dose Seasonal PF 11/16/2020   Influenza,inj,Quad PF,6+ Mos 01/21/2016   Influenza,inj,quad, With Preservative 12/04/2017   PFIZER(Purple Top)SARS-COV-2 Vaccination 02/27/2019, 03/24/2019, 10/02/2019, 05/23/2020   Pfizer Covid-19 Vaccine Bivalent Booster 28yr & up 10/23/2020   Pneumococcal Conjugate-13 01/20/2014   Pneumococcal Polysaccharide-23 08/01/2010   Tdap 01/21/2016, 09/15/2017   Zoster Recombinat (Shingrix) 12/11/2017, 02/27/2018    Health Maintenance  Topic Date Due   INFLUENZA VACCINE  08/19/2021   COLONOSCOPY (Pts 45-425yrInsurance coverage will need to be confirmed)  08/21/2022   MAMMOGRAM  12/11/2022   TETANUS/TDAP  09/16/2027   DEXA SCAN  Completed   COVID-19 Vaccine  Completed   Hepatitis C Screening  Completed   Zoster Vaccines- Shingrix  Completed   HPV VACCINES  Aged Out   Pneumonia Vaccine 6555Years old  Discontinued  I will increase her Lipitor to 40 mg.  Continue to be  followed by rheumatology.  I will check a vitamin D level on her and possibly refer for further evaluation of her osteoporosis.  Discussed health benefits of physical activity, and encouraged her to engage in regular exercise appropriate for her age and condition.  Problem List Items Addressed This Visit     Cigarette smoker   Essential hypertension - Primary   Relevant Medications   metoprolol succinate (TOPROL-XL) 50 MG 24 hr tablet   atorvastatin (LIPITOR) 40 MG tablet   Gastroesophageal reflux disease without esophagitis   Relevant Medications   pantoprazole (PROTONIX) 40 MG tablet   High risk medication use   History of colonic polyps   Hyperlipidemia   Relevant Medications   metoprolol succinate (TOPROL-XL) 50 MG 24 hr tablet   atorvastatin (LIPITOR) 40 MG tablet   Hypothyroidism   Relevant Medications   metoprolol succinate (TOPROL-XL) 50 MG 24 hr tablet   levothyroxine (SYNTHROID) 25 MCG tablet   Nonrheumatic aortic valve stenosis   Relevant Medications   metoprolol succinate (TOPROL-XL) 50 MG 24 hr tablet   atorvastatin (LIPITOR) 40 MG tablet   Osteoporosis   Psoriasis   Psoriatic arthritis ([facility])   Sicca complex ([facility]  Return in about 1 year (around 08/13/2022) for awv/cpe.     Jill Alexanders, MD

## 2021-08-12 NOTE — Patient Instructions (Signed)

## 2021-08-13 LAB — VITAMIN D 25 HYDROXY (VIT D DEFICIENCY, FRACTURES): Vit D, 25-Hydroxy: 88.8 ng/mL (ref 30.0–100.0)

## 2021-08-13 LAB — SPECIMEN STATUS REPORT

## 2021-08-14 ENCOUNTER — Encounter: Payer: Self-pay | Admitting: Medical

## 2021-08-14 ENCOUNTER — Ambulatory Visit (INDEPENDENT_AMBULATORY_CARE_PROVIDER_SITE_OTHER): Payer: Medicare Other | Admitting: Medical

## 2021-08-14 VITALS — BP 128/82 | HR 60 | Wt 112.2 lb

## 2021-08-14 DIAGNOSIS — H938X1 Other specified disorders of right ear: Secondary | ICD-10-CM

## 2021-08-14 DIAGNOSIS — H6123 Impacted cerumen, bilateral: Secondary | ICD-10-CM | POA: Diagnosis not present

## 2021-08-14 NOTE — Progress Notes (Signed)
Subjective:  April Cox is a 66 y.o. female who presents for Chief Complaint  Patient presents with   other    Wax in rt. Ear feels stopped up notices it in the morning      Here for ear pressure, feels stopped up.  No pain, but wax feels sticky at times ,and wants this looked at.  Hearing feels decreased.  No prior lavage for ear wax.  No other aggravating or relieving factors.    No other c/o.  The following portions of the patient's history were reviewed and updated as appropriate: allergies, current medications, past family history, past medical history, past social history, past surgical history and problem list.  ROS Otherwise as in subjective above    Objective: BP 128/82   Pulse 60   Wt 112 lb 3.2 oz (50.9 kg)   BMI 18.39 kg/m   General appearance: alert, no distress, well developed, well nourished Moderate cerumen left ear canal, impacted cerumen right ear canal, ear nontender   Assessment: Encounter Diagnoses  Name Primary?   Ear pressure, right Yes   Impacted cerumen of both ears      Plan: Discussed findings.  Discussed risk/benefits of procedure and patient agrees to procedure. Successfully used warm water lavage to remove impacted cerumen from bilat ear canal. Patient tolerated procedure well. Advised they avoid using any cotton swabs or other devices to clean the ear canals.  Use basic hygiene as discussed.  Follow up prn.    April Cox was seen today for other.  Diagnoses and all orders for this visit:  Ear pressure, right  Impacted cerumen of both ears    Follow up: prn

## 2021-09-24 ENCOUNTER — Encounter: Payer: Self-pay | Admitting: Internal Medicine

## 2021-10-14 ENCOUNTER — Telehealth: Payer: Self-pay | Admitting: Rheumatology

## 2021-10-14 ENCOUNTER — Telehealth (INDEPENDENT_AMBULATORY_CARE_PROVIDER_SITE_OTHER): Payer: Medicare Other | Admitting: Family Medicine

## 2021-10-14 ENCOUNTER — Encounter: Payer: Self-pay | Admitting: Family Medicine

## 2021-10-14 VITALS — Temp 98.8°F | Wt 112.0 lb

## 2021-10-14 DIAGNOSIS — M3500 Sicca syndrome, unspecified: Secondary | ICD-10-CM | POA: Diagnosis not present

## 2021-10-14 DIAGNOSIS — U071 COVID-19: Secondary | ICD-10-CM | POA: Diagnosis not present

## 2021-10-14 DIAGNOSIS — J309 Allergic rhinitis, unspecified: Secondary | ICD-10-CM | POA: Diagnosis not present

## 2021-10-14 DIAGNOSIS — L4050 Arthropathic psoriasis, unspecified: Secondary | ICD-10-CM

## 2021-10-14 MED ORDER — NIRMATRELVIR/RITONAVIR (PAXLOVID)TABLET: 3.0000 | ORAL_TABLET | Freq: Two times a day (BID) | ORAL | 0 refills | Status: AC

## 2021-10-14 NOTE — Telephone Encounter (Signed)
Returned call to the patient and reviewed the following:   If you test POSITIVE for COVID19 and have MILD to MODERATE symptoms: First, call your PCP if you would like to receive COVID19 treatment AND Hold your medications during the infection and for at least 1 week after your symptoms have resolved: Injectable medication (Benlysta, Cimzia, Cosentyx, Enbrel, Humira, Orencia, Remicade, Simponi, Stelara, Taltz, Tremfya) Methotrexate Leflunomide (Arava) Azathioprine Mycophenolate (Cellcept) April Cox, or Rinvoq Otezla If you take Actemra or Kevzara, you DO NOT need to hold these for COVID19 infection.  If you test POSITIVE for COVID19 and have NO symptoms: First, call your PCP if you would like to receive COVID19 treatment AND Hold your medications for at least 10 days after the day that you tested positive Injectable medication (Benlysta, Cimzia, Cosentyx, Enbrel, Humira, Orencia, Remicade, Simponi, Stelara, Taltz, Tremfya) Methotrexate Leflunomide (Arava) Azathioprine Mycophenolate (Cellcept) April Cox, or Rinvoq Otezla If you take Actemra or Kevzara, you DO NOT need to hold these for COVID19 infection.

## 2021-10-14 NOTE — Progress Notes (Signed)
   Subjective:    Patient ID: April Cox, female    DOB: 28-Jan-1955, 66 y.o.   MRN: [id number]  HPI Documentation for virtual audio and video telecommunications through [place] encounter: The patient was located at home. 2 patient identifiers used.  The provider was located in the office. The patient did consent to this visit and is aware of possible charges through their insurance for this visit. The other persons participating in this telemedicine service were none. Time spent on call was 5 minutes and in review of previous records >20 minutes total for counseling and coordination of care. This virtual service is not related to other E/M service within previous 7 days.  She states that on Saturday she developed a slight nasal congestion and coughing.  She does have underlying allergies.  She also has underlying psoriatic arthritis but presently is not on a DMARD.  On Monday the cough got worse as well as nasal congestion and headache as well as a temperature of 100.7.  She tested herself that day and was positive for COVID.  Review of Systems     Objective:   Physical Exam Alert and in no distress otherwise not examined       Assessment & Plan:  COVID - Plan: nirmatrelvir/ritonavir EUA (PAXLOVID) 20 x 150 MG & 10 x '100MG'$  TABS  Psoriatic arthritis ([place])  Sicca complex (HCC)  Allergic rhinitis, unspecified seasonality, unspecified trigger Under the circumstances I think is appropriate to place her on Paxlovid.  She was instructed if the coughing fever and shortness of breath got worse, she would need to go to the hospital.  She is to continue with symptomatic care and call if any problems.

## 2021-10-14 NOTE — Telephone Encounter (Signed)
Patient called stating she tested positive for Covid last night, 10/13/21.  Patient has cough, congestion, low grade fever, and fatigue.  Patient requested a return call to let her know if she needs to stop taking her medication.

## 2021-10-17 ENCOUNTER — Other Ambulatory Visit: Payer: Self-pay | Admitting: Medical

## 2021-10-17 ENCOUNTER — Telehealth: Payer: Self-pay | Admitting: Family Medicine

## 2021-10-17 MED ORDER — PROMETHAZINE-DM 6.25-15 MG/5ML PO SYRP: 5.0000 mL | ORAL_SOLUTION | Freq: Four times a day (QID) | ORAL | 0 refills | Status: DC | PRN

## 2021-10-17 NOTE — Telephone Encounter (Signed)
Pt called in and states the medication you prescribed for covid is helping with everything but the cough. She says the cough is hurting her chest muscles and wants to know if you recommend anything over the counter that will help.

## 2021-10-17 NOTE — Telephone Encounter (Signed)
Pt was notified.  

## 2021-10-21 ENCOUNTER — Telehealth: Payer: Self-pay | Admitting: Family Medicine

## 2021-10-21 MED ORDER — BENZONATATE 100 MG PO CAPS: 200.0000 mg | ORAL_CAPSULE | Freq: Two times a day (BID) | ORAL | 0 refills | Status: DC | PRN

## 2021-10-21 NOTE — Telephone Encounter (Signed)
Pt called and states that she is still having a bad dry non productive cough, she is has been taking the cough syrup promethazine-dm that was sent it and used it today is her last day for it, states when she coughs it makes her SOB and that is the only time she gets that way  She wants to know if you will send her something else in,   Pt uses [facility] [id number], [address]  Pt can be reached at [phone]

## 2021-10-21 NOTE — Telephone Encounter (Signed)
Pt called back to check on earlier message

## 2021-10-28 ENCOUNTER — Encounter: Payer: Self-pay | Admitting: Internal Medicine

## 2021-11-10 ENCOUNTER — Encounter: Payer: Self-pay | Admitting: Internal Medicine

## 2021-11-16 ENCOUNTER — Other Ambulatory Visit: Payer: Self-pay | Admitting: Physician Assistant

## 2021-11-17 NOTE — Telephone Encounter (Signed)
Next Visit: 01/01/2022  Last Visit: 07/31/2021  Last Fill: 02/18/2021  Dx: Psoriatic arthritis   Current Dose per office note on 7/62/8315: folic acid 1 mg 2 tablets daily  Okay to refill Folic Acid?

## 2021-12-09 DIAGNOSIS — K219 Gastro-esophageal reflux disease without esophagitis: Secondary | ICD-10-CM | POA: Diagnosis not present

## 2021-12-09 DIAGNOSIS — L4050 Arthropathic psoriasis, unspecified: Secondary | ICD-10-CM | POA: Diagnosis not present

## 2021-12-09 DIAGNOSIS — Z720 Tobacco use: Secondary | ICD-10-CM | POA: Diagnosis not present

## 2021-12-09 DIAGNOSIS — I10 Essential (primary) hypertension: Secondary | ICD-10-CM | POA: Diagnosis not present

## 2021-12-09 DIAGNOSIS — E039 Hypothyroidism, unspecified: Secondary | ICD-10-CM | POA: Diagnosis not present

## 2021-12-09 DIAGNOSIS — M810 Age-related osteoporosis without current pathological fracture: Secondary | ICD-10-CM | POA: Diagnosis not present

## 2021-12-19 NOTE — Progress Notes (Unsigned)
Office Visit Note  Patient: April Cox             Date of Birth: 06/17/1955           MRN: [id number]             PCP: April Lung, MD Referring: April Lung, MD Visit Date: 01/01/2022 Occupation: '@GUAROCC'$ @  Subjective:  Medication monitoring   History of Present Illness: April Cox is a 66 y.o. female with history of psoriatic arthritis, osteoarthritis, DDD, and osteoporosis.  She remains on Cosentyx 150 mg every 14 days (started September 2019), methotrexate 1 mL every 7 days, and folic acid 1 mg 2 tablets daily.  She is tolerating combination therapy without any side effects or injection site reactions.  Patient reports she had a gap in therapy during the month of October due to receiving the RSV vaccine, flu vaccine, and covid-19 booster. She reports being diagnosed with covid-19 infection prior to getting the covid-19 booster so she continued to hold both cosentyx and MTX.  Patient reports that she had a flare in October but since resuming her medications her symptoms have resolved.  Overall her psoriatic arthritis remains stable on the current treatment regimen.  She had a mild flare of psoriasis on her scalp in October but these patches have since resolved.  She continues to experience intermittent pain in both SI joints but denies any nocturnal symptoms at this time.  She has not had any Achilles tendinitis or plantar fasciitis.  She denies any joint swelling currently. Patient reports that she is no longer on Tymlos but has been transition to IV Reclast.  Her first IV Reclast infusion was administered last week.  She tolerated Reclast without any side effects.  She continues to take a calcium and vitamin D supplement daily as advised.     Activities of Daily Living:  Patient reports morning stiffness for 1 hour.   Patient Denies nocturnal pain.  Difficulty dressing/grooming: Denies Difficulty climbing stairs: Denies Difficulty getting out of chair:  Denies Difficulty using hands for taps, buttons, cutlery, and/or writing: Reports  Review of Systems  Constitutional:  Negative for fatigue.  HENT:  Positive for mouth dryness. Negative for mouth sores.   Eyes:  Positive for dryness.  Respiratory:  Negative for shortness of breath.   Cardiovascular:  Negative for chest pain and palpitations.  Gastrointestinal:  Negative for blood in stool, constipation and diarrhea.  Endocrine: Negative for increased urination.  Genitourinary:  Positive for involuntary urination.  Musculoskeletal:  Positive for joint pain, joint pain and morning stiffness. Negative for gait problem, joint swelling, myalgias, muscle weakness, muscle tenderness and myalgias.  Skin:  Negative for color change, rash, hair loss and sensitivity to sunlight.  Allergic/Immunologic: Negative for susceptible to infections.  Neurological:  Negative for dizziness and headaches.  Hematological:  Negative for swollen glands.  Psychiatric/Behavioral:  Negative for depressed mood and sleep disturbance. The patient is not nervous/anxious.     PMFS History:  Patient Active Problem List   Diagnosis Date Noted   Nonrheumatic aortic valve stenosis 08/12/2021   Sicca complex ([place]) 04/01/2017   DDD (degenerative disc disease), lumbar 04/01/2017   Psoriasis 04/01/2017   Dry eye 01/01/2017   Spondylosis of lumbar region without myelopathy or radiculopathy 01/07/2016   High risk medication use 01/07/2016   Essential hypertension 09/10/2014   History of HPV infection 09/10/2014   History of colonic polyps 09/10/2014   Osteoporosis 09/10/2014   Gastroesophageal reflux disease  without esophagitis 09/10/2014   Cigarette smoker 05/11/2011   Hyperlipidemia 08/22/2010   Hypothyroidism 08/22/2010   Psoriatic arthritis ([place]) 08/22/2010    Past Medical History:  Diagnosis Date   Abnormal Pap smear of cervix    Anemia    history of anemia   Arthritis    Cataract    Diverticulosis of colon     Family history of adverse reaction to anesthesia    mother PONV   GERD (gastroesophageal reflux disease)    Heart murmur    asymptomatic per pt   History of adenomatous polyp of colon    11-06-2009  tubullar   HPV in female    Hyperlipidemia    Hypertension    Hypothyroidism    Mild aortic valve stenosis    per echo 10-09-2010  ef 60-65%  valva area 1.48cm   Osteoporosis    Psoriasis    Psoriatic arthritis ([place])    Dr. Estanislado Pandy   Wears glasses     Family History  Problem Relation Age of Onset   Arthritis Mother        OA    Psoriasis Mother    Other Father        accidental death/MVA at age 23   Hypertension Brother    Other Brother        1 brother drowned   Cancer Brother    Esophageal cancer Brother    Brain cancer Brother    Hypertension Maternal Grandmother    Diabetes Maternal Grandmother    Hypertension Maternal Grandfather    Heart disease Neg Hx    Colon cancer Neg Hx    Colon polyps Neg Hx    Stomach cancer Neg Hx    Past Surgical History:  Procedure Laterality Date   ABDOMINAL HYSTERECTOMY     ANTERIOR LAT LUMBAR FUSION  12/25/2011   Procedure: ANTERIOR LATERAL LUMBAR FUSION 1 LEVEL;  Surgeon: Eustace Moore, MD;  Location: [facility];  Service: Neurosurgery;  Laterality: Left;  LUMBAR FOUR-FIVE   BLADDER SUSPENSION N/A 12/19/2014   Procedure: TRANSVAGINAL TAPE (TVT) PROCEDURE;  Surgeon: Everett Graff, MD;  Location: [facility];  Service: Gynecology;  Laterality: N/A;   CARPAL TUNNEL RELEASE Right 04/2013   CATARACT EXTRACTION, BILATERAL     05/2020, 06/2020   CERVICAL CONIZATION W/BX N/A 08/28/2014   Procedure: CONIZATION CERVIX WITH BIOPSY;  Surgeon: Ena Dawley, MD;  Location: [facility];  Service: Gynecology;  Laterality: N/A;   COLONOSCOPY  11/06/2009   COLONOSCOPY  08/2019   CYSTO N/A 12/19/2014   Procedure: Kathrene Alu;  Surgeon: Everett Graff, MD;  Location: [facility];  Service: Gynecology;  Laterality: N/A;   DENTAL SURGERY      HYSTEROSCOPY WITH D & C  04/04/2008   KNEE ARTHROPLASTY     LUMBAR PERCUTANEOUS PEDICLE SCREW 1 LEVEL  12/25/2011   Procedure: LUMBAR PERCUTANEOUS PEDICLE SCREW 1 LEVEL;  Surgeon: Eustace Moore, MD;  Location: [facility];  Service: Neurosurgery;  Laterality: Left;  LUMBAR FOUR-FIVE   PERONEAL NERVE DECOMPRESSION Left 12/27/2009   for neuropathy with  foot drop   POLYPECTOMY     TRANSTHORACIC ECHOCARDIOGRAM  10/09/2010   mild LVH,  ef 60-65%,  grade I diastolic dysfunction,  very mild AV stenosis (area 1.48cm/S/2(VTI) with no regurg./  mild MV calcification without stenosis or regur. /  trivial TR   VAGINAL HYSTERECTOMY Bilateral 12/19/2014   Procedure: TOTAL VAGINAL HYSTERECTOMY WITH BILATERAL SALPINGECTOMY;  Surgeon: Ena Dawley, MD;  Location: [facility];  Service: Gynecology;  Laterality: Bilateral;   Social History   Social History Narrative   Not on file   Immunization History  Administered Date(s) Administered   Covid-19, Mrna,Vaccine(Spikevax)28yr and older 11/11/2021   Fluad Quad(high Dose 65+) 10/11/2018   Influenza Split 12/19/2010, 12/01/2011   Influenza, High Dose Seasonal PF 11/16/2020, 11/06/2021   Influenza,inj,Quad PF,6+ Mos 01/21/2016   Influenza,inj,quad, With Preservative 12/04/2017   PFIZER(Purple Top)SARS-COV-2 Vaccination 02/27/2019, 03/24/2019, 10/02/2019, 05/23/2020   Pfizer Covid-19 Vaccine Bivalent Booster 146yr& up 10/23/2020   Pneumococcal Conjugate-13 01/20/2014   Pneumococcal Polysaccharide-23 08/01/2010   Rsv, Bivalent, Protein Subunit Rsvpref,pf (AEvans Lance09/18/2023   Tdap 01/21/2016, 09/15/2017   Zoster Recombinat (Shingrix) 12/11/2017, 02/27/2018     Objective: Vital Signs: BP (!) 165/95 (BP Location: Left Arm, Patient Position: Sitting, Cuff Size: Normal)   Pulse 78   Ht '5\' 5"'$  (1.651 m)   Wt 98 lb 6.4 oz (44.6 kg)   BMI 16.37 kg/m    Physical Exam Vitals and nursing note reviewed.  Constitutional:      Appearance: She is  well-developed.  HENT:     Head: Normocephalic and atraumatic.  Eyes:     Conjunctiva/sclera: Conjunctivae normal.  Cardiovascular:     Rate and Rhythm: Normal rate and regular rhythm.     Heart sounds: Normal heart sounds.  Pulmonary:     Effort: Pulmonary effort is normal.     Breath sounds: Normal breath sounds.  Abdominal:     General: Bowel sounds are normal.     Palpations: Abdomen is soft.  Musculoskeletal:     Cervical back: Normal range of motion.  Skin:    General: Skin is warm and dry.     Capillary Refill: Capillary refill takes less than 2 seconds.  Neurological:     Mental Status: She is alert and oriented to person, place, and time.  Psychiatric:        Behavior: Behavior normal.      Musculoskeletal Exam: C-spine has good range of motion.  No midline spinal tenderness.  She has some tenderness over both SI joints.  Shoulder joints, elbow joints, and wrist joints have good range of motion.  PIP and DIP thickening consistent with osteoarthritis and psoriatic arthritis overlap.  Hip joints have good range of motion with no groin pain.  Knee joints have good range of motion with no warmth or effusion.  Ankle joints have good ROM with no tenderness or joint swelling.  No evidence of achilles tendonitis or plantar fasciitis.   CDAI Exam: CDAI Score: -- Patient Global: --; Provider Global: -- Swollen: --; Tender: -- Joint Exam 01/01/2022   No joint exam has been documented for this visit   There is currently no information documented on the homunculus. Go to the Rheumatology activity and complete the homunculus joint exam.  Investigation: No additional findings.  Imaging: No results found.  Recent Labs: Lab Results  Component Value Date   WBC 9.3 08/11/2021   HGB 13.7 08/11/2021   PLT 309 08/11/2021   NA 139 08/11/2021   K 4.4 08/11/2021   CL 99 08/11/2021   CO2 26 08/11/2021   GLUCOSE 91 08/11/2021   BUN 6 (L) 08/11/2021   CREATININE 0.67 08/11/2021    BILITOT 0.8 08/11/2021   ALKPHOS 105 08/11/2021   AST 19 08/11/2021   ALT 13 08/11/2021   PROT 7.8 08/11/2021   ALBUMIN 4.9 08/11/2021   CALCIUM 10.4 (H) 08/11/2021   GFRAA 113 04/02/2020  QFTBGOLD Negative 08/02/2015   QFTBGOLDPLUS NEGATIVE 02/27/2021    Speciality Comments: Prior therapy: Remicade and Humira (inadequate response) Osteoporosis managed by PCP. Last DXA August 2019.  Procedures:  No procedures performed Allergies: Codeine, Other, Penicillins, and Gluten meal   Assessment / Plan:     Visit Diagnoses: Psoriatic arthritis ([place]): No synovitis or dactylitis was noted on examination today.  She continues to have some SI joint tenderness bilaterally.  No evidence of Achilles tendinitis or plantar fasciitis.  She is currently on Cosentyx 150 mg subcutaneous injections every 14 days and methotrexate 1 mL sq injections once weekly.  Patient had a gap in therapy during the month of October due to being diagnosed with COVID-19 as well as while she was receiving her RSV, annual flu shot, and COVID-19 booster.  She had a flare of psoriasis on her scalp during that time and also experienced a flare in her joints which has since resolved.  Overall when when she remains on Cosentyx and methotrexate without interruption her symptoms have been well-controlled.  She will remain on Cosentyx and methotrexate as prescribed.  She was advised to notify us if she develops signs or symptoms of a flare.  She will follow-up in the office in 5 months or sooner if needed.  Psoriasis: Scalp-No active psoriasis at this time.  She had a flare in October 2023 while there was a gap in therapy.    High risk medication use - Cosentyx 150 mg every 14 days (started September 2019), methotrexate 1 mL every 7 days, and folic acid 1 mg 2 tablets daily. Inadequate response to remicade. - Plan: CBC with Differential/Platelet, COMPLETE METABOLIC PANEL WITH GFR, QuantiFERON-TB Gold Plus, CBC with Differential/Platelet,  COMPLETE METABOLIC PANEL WITH GFR CBC and CMP updated on 08/11/21. Orders for CBC and CMP released today.  Her next lab work will be due in march and every 3 months.  Standing orders for CBC and CMP were placed today. TB gold negative on 02/27/21.  Future order for TB gold placed today.  She was diagnosed with COVID-19 in October 2023 at which time she held Cosentyx and methotrexate until her symptoms had completely resolved.  Discussed the importance of holding cosentyx and methotrexate if she develops signs or symptoms of an infection and to resume once the infection has completely cleared.   Sicca complex ([place]): Chronic, unchanged.   Primary osteoarthritis of both hands: She has severe PIP and DIP thickening and prominence consistent with psoriatic arthritis and osteoarthritis overlap.  No active inflammation noted on examination today. Discussed the importance of joint protection and muscle strengthening.   DDD (degenerative disc disease), lumbar: No midline spinal tenderness at this time.   Age-related osteoporosis without current pathological fracture - DEXA scan on April 24, 2021 according to the report T score was -3.8 in the left radius.  Completed Tymlos prescribed by Dr. Redmond School.  Transitioned to IV reclast-infusion last week.  Tolerated reclast without any side effects. She continues to take a calcium and vitamin D supplement daily.   Other medical conditions are listed as follows:   History of gastroesophageal reflux (GERD)  History of hypothyroidism  History of hypertension: Blood pressure was 165/98 today in the office.  Blood pressure was rechecked prior to the patient leaving today.  Advised patient to monitor her blood pressure closely.    History of colonic polyps  Smoker    Orders: Orders Placed This Encounter  Procedures   CBC with Differential/Platelet   COMPLETE METABOLIC PANEL  WITH GFR   QuantiFERON-TB Gold Plus   CBC with Differential/Platelet   COMPLETE  METABOLIC PANEL WITH GFR   No orders of the defined types were placed in this encounter.    Follow-Up Instructions: Return in about 5 months (around 06/02/2022) for Psoriatic arthritis, DDD, Osteoporosis.   Ofilia Neas, PA-C  Note - This record has been created using Dragon software.  Chart creation errors have been sought, but may not always  have been located. Such creation errors do not reflect on  the standard of medical care.

## 2021-12-22 ENCOUNTER — Other Ambulatory Visit: Payer: Self-pay | Admitting: Family Medicine

## 2021-12-25 DIAGNOSIS — M810 Age-related osteoporosis without current pathological fracture: Secondary | ICD-10-CM | POA: Diagnosis not present

## 2022-01-01 ENCOUNTER — Encounter: Payer: Self-pay | Admitting: Physician Assistant

## 2022-01-01 ENCOUNTER — Ambulatory Visit: Payer: Medicare Other | Attending: Physician Assistant | Admitting: Physician Assistant

## 2022-01-01 VITALS — BP 165/95 | HR 78 | Ht 65.0 in | Wt 98.4 lb

## 2022-01-01 DIAGNOSIS — Z8719 Personal history of other diseases of the digestive system: Secondary | ICD-10-CM | POA: Diagnosis not present

## 2022-01-01 DIAGNOSIS — Z8639 Personal history of other endocrine, nutritional and metabolic disease: Secondary | ICD-10-CM | POA: Diagnosis not present

## 2022-01-01 DIAGNOSIS — Z79899 Other long term (current) drug therapy: Secondary | ICD-10-CM | POA: Diagnosis not present

## 2022-01-01 DIAGNOSIS — M5136 Other intervertebral disc degeneration, lumbar region: Secondary | ICD-10-CM | POA: Diagnosis not present

## 2022-01-01 DIAGNOSIS — Z86010 Personal history of colonic polyps: Secondary | ICD-10-CM

## 2022-01-01 DIAGNOSIS — M3500 Sicca syndrome, unspecified: Secondary | ICD-10-CM

## 2022-01-01 DIAGNOSIS — M810 Age-related osteoporosis without current pathological fracture: Secondary | ICD-10-CM | POA: Diagnosis not present

## 2022-01-01 DIAGNOSIS — L409 Psoriasis, unspecified: Secondary | ICD-10-CM | POA: Diagnosis not present

## 2022-01-01 DIAGNOSIS — Z8679 Personal history of other diseases of the circulatory system: Secondary | ICD-10-CM | POA: Diagnosis not present

## 2022-01-01 DIAGNOSIS — M19042 Primary osteoarthritis, left hand: Secondary | ICD-10-CM

## 2022-01-01 DIAGNOSIS — M19041 Primary osteoarthritis, right hand: Secondary | ICD-10-CM | POA: Diagnosis not present

## 2022-01-01 DIAGNOSIS — L4050 Arthropathic psoriasis, unspecified: Secondary | ICD-10-CM

## 2022-01-01 DIAGNOSIS — Z111 Encounter for screening for respiratory tuberculosis: Secondary | ICD-10-CM

## 2022-01-01 DIAGNOSIS — F17200 Nicotine dependence, unspecified, uncomplicated: Secondary | ICD-10-CM

## 2022-01-01 NOTE — Patient Instructions (Signed)
Standing Labs We placed an order today for your standing lab work.   Please have your standing labs drawn in March and every 3 months   Please have your labs drawn 2 weeks prior to your appointment so that the provider can discuss your lab results at your appointment.  Please note that you may see your imaging and lab results in MyChart before we have reviewed them. We will contact you once all results are reviewed. Please allow our office up to 72 hours to thoroughly review all of the results before contacting the office for clarification of your results.  Lab hours are:   Monday through Thursday from 8:00 am -12:30 pm and 1:00 pm-5:00 pm and Friday from 8:00 am-12:00 pm.  Please be advised, all patients with office appointments requiring lab work will take precedent over walk-in lab work.   Labs are drawn by [facility]. Please bring your co-pay at the time of your lab draw.  You may receive a bill from [facility] for your lab work.  Please note if you are on Hydroxychloroquine and and an order has been placed for a Hydroxychloroquine level, you will need to have it drawn 4 hours or more after your last dose.  If you wish to have your labs drawn at another location, please call the office 24 hours in advance so we can fax the orders.  The office is located at [address], [address], [place], [place] [address] No appointment is necessary.    If you have any questions regarding directions or hours of operation,  please call [phone].   As a reminder, please drink plenty of water prior to coming for your lab work. Thanks!  If you have signs or symptoms of an infection or start antibiotics: First, call your PCP for workup of your infection. Hold your medication through the infection, until you complete your antibiotics, and until symptoms resolve if you take the following: Injectable medication (Actemra, Benlysta, Cimzia, Cosentyx, Enbrel, Humira, Kevzara, Orencia, Remicade, Simponi,  Stelara, Taltz, Tremfya) Methotrexate Leflunomide (Arava) Mycophenolate (Cellcept) Xeljanz, Olumiant, or Rinvoq   Vaccines You are taking a medication(s) that can suppress your immune system.  The following immunizations are recommended: Flu annually Covid-19  Td/Tdap (tetanus, diphtheria, pertussis) every 10 years Pneumonia (Prevnar 15 then Pneumovax 23 at least 1 year apart.  Alternatively, can take Prevnar 20 without needing additional dose) Shingrix: 2 doses from 4 weeks to 6 months apart  Please check with your PCP to make sure you are up to date.   

## 2022-01-01 NOTE — Progress Notes (Signed)
Plt count is slightly elevated. Rest of CBC WNL.

## 2022-01-02 LAB — CBC WITH DIFFERENTIAL/PLATELET
Absolute Monocytes: 333 cells/uL (ref 200–950)
Basophils Absolute: 10 cells/uL (ref 0–200)
Basophils Relative: 0.1 %
Eosinophils Absolute: 29 cells/uL (ref 15–500)
Eosinophils Relative: 0.3 %
HCT: 39.1 % (ref 35.0–45.0)
Hemoglobin: 13.4 g/dL (ref 11.7–15.5)
Lymphs Abs: 2546 cells/uL (ref 850–3900)
MCH: 32.6 pg (ref 27.0–33.0)
MCHC: 34.3 g/dL (ref 32.0–36.0)
MCV: 95.1 fL (ref 80.0–100.0)
MPV: 10 fL (ref 7.5–12.5)
Monocytes Relative: 3.5 %
Neutro Abs: 6584 cells/uL (ref 1500–7800)
Neutrophils Relative %: 69.3 %
Platelets: 429 10*3/uL — ABNORMAL HIGH (ref 140–400)
RBC: 4.11 10*6/uL (ref 3.80–5.10)
RDW: 12.1 % (ref 11.0–15.0)
Total Lymphocyte: 26.8 %
WBC: 9.5 10*3/uL (ref 3.8–10.8)

## 2022-01-02 LAB — COMPLETE METABOLIC PANEL WITH GFR
AG Ratio: 1.3 (calc) (ref 1.0–2.5)
ALT: 13 U/L (ref 6–29)
AST: 22 U/L (ref 10–35)
Albumin: 4 g/dL (ref 3.6–5.1)
Alkaline phosphatase (APISO): 118 U/L (ref 37–153)
BUN/Creatinine Ratio: 8 (calc) (ref 6–22)
BUN: 6 mg/dL — ABNORMAL LOW (ref 7–25)
CO2: 27 mmol/L (ref 20–32)
Calcium: 9.2 mg/dL (ref 8.6–10.4)
Chloride: 107 mmol/L (ref 98–110)
Creat: 0.71 mg/dL (ref 0.50–1.05)
Globulin: 3.2 g/dL (calc) (ref 1.9–3.7)
Glucose, Bld: 70 mg/dL (ref 65–99)
Potassium: 4.2 mmol/L (ref 3.5–5.3)
Sodium: 142 mmol/L (ref 135–146)
Total Bilirubin: 0.5 mg/dL (ref 0.2–1.2)
Total Protein: 7.2 g/dL (ref 6.1–8.1)
eGFR: 93 mL/min/{1.73_m2} (ref >=60)

## 2022-01-02 NOTE — Progress Notes (Signed)
CMP WNL

## 2022-01-08 ENCOUNTER — Other Ambulatory Visit: Payer: Self-pay | Admitting: Family Medicine

## 2022-01-08 DIAGNOSIS — K219 Gastro-esophageal reflux disease without esophagitis: Secondary | ICD-10-CM

## 2022-01-14 ENCOUNTER — Other Ambulatory Visit: Payer: Self-pay | Admitting: *Deleted

## 2022-01-14 MED ORDER — METHOTREXATE SODIUM CHEMO INJECTION 250 MG/10ML: INTRAMUSCULAR | 0 refills | Status: DC

## 2022-01-14 NOTE — Telephone Encounter (Signed)
Next Visit: 06/10/2022  Last Visit: 01/01/2022  Last Fill: 06/18/2021  DX:  Psoriatic arthritis   Current Dose per office note 01/01/2022: methotrexate 1 mL every 7 days,   Labs: 01/01/2022  CMP WNL. Plt count is slightly elevated. Rest of CBC WNL.   Okay to refill MTX?

## 2022-01-17 ENCOUNTER — Other Ambulatory Visit: Payer: Self-pay | Admitting: Family Medicine

## 2022-01-17 DIAGNOSIS — I10 Essential (primary) hypertension: Secondary | ICD-10-CM

## 2022-03-24 ENCOUNTER — Encounter: Payer: Self-pay | Admitting: Obstetrics and Gynecology

## 2022-03-24 DIAGNOSIS — R3915 Urgency of urination: Secondary | ICD-10-CM | POA: Diagnosis not present

## 2022-03-24 DIAGNOSIS — Z1211 Encounter for screening for malignant neoplasm of colon: Secondary | ICD-10-CM | POA: Diagnosis not present

## 2022-03-24 DIAGNOSIS — Z1231 Encounter for screening mammogram for malignant neoplasm of breast: Secondary | ICD-10-CM | POA: Diagnosis not present

## 2022-03-24 DIAGNOSIS — Z681 Body mass index (BMI) 19 or less, adult: Secondary | ICD-10-CM | POA: Diagnosis not present

## 2022-03-24 DIAGNOSIS — M810 Age-related osteoporosis without current pathological fracture: Secondary | ICD-10-CM

## 2022-03-25 ENCOUNTER — Other Ambulatory Visit: Payer: Self-pay | Admitting: Obstetrics and Gynecology

## 2022-03-25 DIAGNOSIS — M810 Age-related osteoporosis without current pathological fracture: Secondary | ICD-10-CM

## 2022-03-30 ENCOUNTER — Other Ambulatory Visit: Payer: Self-pay | Admitting: *Deleted

## 2022-03-30 ENCOUNTER — Telehealth: Payer: Self-pay | Admitting: Rheumatology

## 2022-03-30 MED ORDER — PREDNISONE 5 MG PO TABS
ORAL_TABLET | ORAL | 0 refills | Status: DC
Start: 2022-03-30 — End: 2022-06-10

## 2022-03-30 NOTE — Telephone Encounter (Signed)
Ok to send in prednisone 20 mg tapering by 5 mg every 4 days.  Take prednisone in the morning with food.  Avoid the use of NSAIDs while taking prednisone. If her symptoms do not improve please advise the patient to schedule a sooner follow up visit.

## 2022-03-30 NOTE — Telephone Encounter (Signed)
I called patient, patient verbalized understanding, patient offered appt today but declined.

## 2022-03-30 NOTE — Telephone Encounter (Signed)
Patient called stating she is experiencing pain in her right shoulder and left knee.  Patient requested a prescription of Prednisone or something to alleviate the pain to be sent to [facility] on [address] Patient states she was prescribed Prednisone in the past.  Patient states she wants to try Prednisone before scheduling an appointment.

## 2022-04-11 ENCOUNTER — Other Ambulatory Visit: Payer: Self-pay | Admitting: Family Medicine

## 2022-04-11 DIAGNOSIS — K219 Gastro-esophageal reflux disease without esophagitis: Secondary | ICD-10-CM

## 2022-04-13 ENCOUNTER — Other Ambulatory Visit: Payer: Self-pay | Admitting: Physician Assistant

## 2022-04-13 NOTE — Telephone Encounter (Signed)
Last Fill: 02/27/2021  Next Visit: 06/10/2022  Last Visit: 01/01/2022  Dx: Psoriatic arthritis   Current Dose per office note on 01/01/2022:  Cosentyx 150 mg subcutaneous injections every 14 days and methotrexate 1 mL sq injections once weekly.   Okay to refill Syringes?

## 2022-04-27 DIAGNOSIS — H04123 Dry eye syndrome of bilateral lacrimal glands: Secondary | ICD-10-CM | POA: Diagnosis not present

## 2022-04-27 DIAGNOSIS — H524 Presbyopia: Secondary | ICD-10-CM | POA: Diagnosis not present

## 2022-04-27 DIAGNOSIS — H353132 Nonexudative age-related macular degeneration, bilateral, intermediate dry stage: Secondary | ICD-10-CM | POA: Diagnosis not present

## 2022-04-27 DIAGNOSIS — H26493 Other secondary cataract, bilateral: Secondary | ICD-10-CM | POA: Diagnosis not present

## 2022-04-27 DIAGNOSIS — H35033 Hypertensive retinopathy, bilateral: Secondary | ICD-10-CM | POA: Diagnosis not present

## 2022-05-04 DIAGNOSIS — N390 Urinary tract infection, site not specified: Secondary | ICD-10-CM | POA: Diagnosis not present

## 2022-05-19 DIAGNOSIS — R300 Dysuria: Secondary | ICD-10-CM | POA: Diagnosis not present

## 2022-05-19 DIAGNOSIS — R319 Hematuria, unspecified: Secondary | ICD-10-CM | POA: Diagnosis not present

## 2022-05-27 NOTE — Progress Notes (Signed)
Office Visit Note  Patient: April Cox             Date of Birth: Jun 25, 1955           MRN: [id number]             PCP: Ronnald Nian, MD Referring: Ronnald Nian, MD Visit Date: 06/10/2022 Occupation: @GUAROCC @  Subjective:  Medication management  History of Present Illness: April Cox is a 67 y.o. female with history of psoriatic arthritis, osteoarthritis, degenerative disc disease and osteoporosis.  She states she has not had any flares of psoriatic arthritis or psoriasis since the last visit.  She states that she had an episode of right shoulder joint pain which responded to prednisone use with no recurrence.  She is uncertain if it was a psoriatic arthritis flare.  She has been taking Cosentyx 150 mg subcu every 14 days and methotrexate 1 mL subcu weekly along with folic acid 2 mg p.o. daily.  She denies any episodes of Achilles tendinitis of Planter fasciitis.  She denies any history of uveitis.  She started IV Reclast in December 2023 by her PCP for the treatment of osteoporosis.  She finished a course of Tymlos.  She has been exercising on a regular basis.    Activities of Daily Living:  Patient reports morning stiffness for no more than 10 minutes.   Patient Denies nocturnal pain.  Difficulty dressing/grooming: Denies Difficulty climbing stairs: Denies Difficulty getting out of chair: Denies Difficulty using hands for taps, buttons, cutlery, and/or writing: Reports  Review of Systems  Constitutional:  Negative for fatigue.  HENT:  Positive for mouth dryness. Negative for mouth sores.   Eyes:  Positive for dryness.  Respiratory:  Negative for shortness of breath.   Cardiovascular:  Negative for chest pain and palpitations.  Gastrointestinal:  Negative for blood in stool, constipation and diarrhea.  Endocrine: Negative for increased urination.  Genitourinary:  Positive for involuntary urination.  Musculoskeletal:  Positive for morning stiffness. Negative for  joint pain, gait problem, joint pain, joint swelling, myalgias, muscle weakness, muscle tenderness and myalgias.  Skin:  Negative for color change, rash, hair loss and sensitivity to sunlight.  Allergic/Immunologic: Negative for susceptible to infections.  Neurological:  Negative for dizziness and headaches.  Hematological:  Negative for swollen glands.  Psychiatric/Behavioral:  Negative for depressed mood and sleep disturbance. The patient is not nervous/anxious.     PMFS History:  Patient Active Problem List   Diagnosis Date Noted   Nonrheumatic aortic valve stenosis 08/12/2021   Sicca complex (HCC) 04/01/2017   DDD (degenerative disc disease), lumbar 04/01/2017   Psoriasis 04/01/2017   Dry eye 01/01/2017   Spondylosis of lumbar region without myelopathy or radiculopathy 01/07/2016   High risk medication use 01/07/2016   Essential hypertension 09/10/2014   History of HPV infection 09/10/2014   History of colonic polyps 09/10/2014   Osteoporosis 09/10/2014   Gastroesophageal reflux disease without esophagitis 09/10/2014   Cigarette smoker 05/11/2011   Hyperlipidemia 08/22/2010   Hypothyroidism 08/22/2010   Psoriatic arthritis (HCC) 08/22/2010    Past Medical History:  Diagnosis Date   Abnormal Pap smear of cervix    Anemia    history of anemia   Arthritis    Cataract    Diverticulosis of colon    Family history of adverse reaction to anesthesia    mother PONV   GERD (gastroesophageal reflux disease)    Heart murmur    asymptomatic per pt  History of adenomatous polyp of colon    11-06-2009  tubullar   HPV in female    Hyperlipidemia    Hypertension    Hypothyroidism    Irritable bladder    per patient   Mild aortic valve stenosis    per echo 10-09-2010  ef 60-65%  valva area 1.48cm   Osteoporosis    Psoriasis    Psoriatic arthritis (HCC)    Dr. Corliss Skains   Wears glasses     Family History  Problem Relation Age of Onset   Arthritis Mother        OA     Psoriasis Mother    Other Father        accidental death/MVA at age 72   Hypertension Brother    Other Brother        1 brother drowned   Cancer Brother    Esophageal cancer Brother    Brain cancer Brother    Hypertension Maternal Grandmother    Diabetes Maternal Grandmother    Hypertension Maternal Grandfather    Heart disease Neg Hx    Colon cancer Neg Hx    Colon polyps Neg Hx    Stomach cancer Neg Hx    Past Surgical History:  Procedure Laterality Date   ABDOMINAL HYSTERECTOMY     ANTERIOR LAT LUMBAR FUSION  12/25/2011   Procedure: ANTERIOR LATERAL LUMBAR FUSION 1 LEVEL;  Surgeon: Tia Alert, MD;  Location: [facility];  Service: Neurosurgery;  Laterality: Left;  LUMBAR FOUR-FIVE   BLADDER SUSPENSION N/A 12/19/2014   Procedure: TRANSVAGINAL TAPE (TVT) PROCEDURE;  Surgeon: Osborn Coho, MD;  Location: [facility];  Service: Gynecology;  Laterality: N/A;   CARPAL TUNNEL RELEASE Right 04/2013   CATARACT EXTRACTION, BILATERAL     05/2020, 06/2020   CERVICAL CONIZATION W/BX N/A 08/28/2014   Procedure: CONIZATION CERVIX WITH BIOPSY;  Surgeon: Kirkland Hun, MD;  Location[facility];  Service: Gynecology;  Laterality: N/A;   COLONOSCOPY  11/06/2009   COLONOSCOPY  08/2019   CYSTO N/A 12/19/2014   Procedure: Clearnce Sorrel;  Surgeon: Osborn Coho, MD;  Location: [facility];  Service: Gynecology;  Laterality: N/A;   DENTAL SURGERY     HYSTEROSCOPY WITH D & C  04/04/2008   KNEE ARTHROPLASTY     LUMBAR PERCUTANEOUS PEDICLE SCREW 1 LEVEL  12/25/2011   Procedure: LUMBAR PERCUTANEOUS PEDICLE SCREW 1 LEVEL;  Surgeon: Tia Alert, MD;  Location: [facility];  Service: Neurosurgery;  Laterality: Left;  LUMBAR FOUR-FIVE   PERONEAL NERVE DECOMPRESSION Left 12/27/2009   for neuropathy with  foot drop   POLYPECTOMY     TRANSTHORACIC ECHOCARDIOGRAM  10/09/2010   mild LVH,  ef 60-65%,  grade I diastolic dysfunction,  very mild AV stenosis (area 1.48cm/S/2(VTI) with no regurg./  mild  MV calcification without stenosis or regur. /  trivial TR   VAGINAL HYSTERECTOMY Bilateral 12/19/2014   Procedure: TOTAL VAGINAL HYSTERECTOMY WITH BILATERAL SALPINGECTOMY;  Surgeon: Kirkland Hun, MD;  Location: [facility];  Service: Gynecology;  Laterality: Bilateral;   Social History   Social History Narrative   Not on file   Immunization History  Administered Date(s) Administered   Covid-19, Mrna,Vaccine(Spikevax)80yrs and older 11/11/2021   Fluad Quad(high Dose 65+) 10/11/2018   Influenza Split 12/19/2010, 12/01/2011   Influenza, High Dose Seasonal PF 11/16/2020, 11/06/2021   Influenza,inj,Quad PF,6+ Mos 01/21/2016   Influenza,inj,quad, With Preservative 12/04/2017   PFIZER(Purple Top)SARS-COV-2 Vaccination 02/27/2019, 03/24/2019, 10/02/2019, 05/23/2020   Pfizer Covid-19 Vaccine  Bivalent Booster 32yrs & up 10/23/2020   Pneumococcal Conjugate-13 01/20/2014   Pneumococcal Polysaccharide-23 08/01/2010   Rsv, Bivalent, Protein Subunit Rsvpref,pf Verdis Frederickson) 10/06/2021   Tdap 01/21/2016, 09/15/2017   Zoster Recombinat (Shingrix) 12/11/2017, 02/27/2018     Objective: Vital Signs: BP 130/74 (BP Location: Left Arm, Patient Position: Sitting, Cuff Size: Normal)   Pulse 76   Resp 14   Ht 5\' 5"  (1.651 m)   Wt 107 lb 3.2 oz (48.6 kg)   BMI 17.84 kg/m    Physical Exam Vitals and nursing note reviewed.  Constitutional:      Appearance: She is well-developed.  HENT:     Head: Normocephalic and atraumatic.  Eyes:     Conjunctiva/sclera: Conjunctivae normal.  Cardiovascular:     Rate and Rhythm: Normal rate and regular rhythm.     Heart sounds: Normal heart sounds.  Pulmonary:     Effort: Pulmonary effort is normal.     Breath sounds: Normal breath sounds.  Abdominal:     General: Bowel sounds are normal.     Palpations: Abdomen is soft.  Musculoskeletal:     Cervical back: Normal range of motion.  Lymphadenopathy:     Cervical: No cervical adenopathy.  Skin:    General: Skin  is warm and dry.     Capillary Refill: Capillary refill takes less than 2 seconds.  Neurological:     Mental Status: She is alert and oriented to person, place, and time.  Psychiatric:        Behavior: Behavior normal.      Musculoskeletal Exam: She had limited lateral rotation of the cervical spine.  She had full extension and flexion.  Shoulder joints, elbow joints, wrist joints, MCPs PIPs and DIPs were in good range of motion.  She had bilateral PIP and DIP thickening with synovial thickening in the right second and third PIP joints.  No synovitis was noted.  Hip joints and knee joints were in good range of motion without any warmth swelling or effusion.  There was no tenderness over ankles or MTPs.  There was no Achilles tendinitis of Planter fasciitis.  CDAI Exam: CDAI Score: -- Patient Global: --; Provider Global: -- Swollen: --; Tender: -- Joint Exam 06/10/2022   No joint exam has been documented for this visit   There is currently no information documented on the homunculus. Go to the Rheumatology activity and complete the homunculus joint exam.  Investigation: No additional findings.  Imaging: No results found.  Recent Labs: Lab Results  Component Value Date   WBC 9.5 01/01/2022   HGB 13.4 01/01/2022   PLT 429 (H) 01/01/2022   NA 142 01/01/2022   K 4.2 01/01/2022   CL 107 01/01/2022   CO2 27 01/01/2022   GLUCOSE 70 01/01/2022   BUN 6 (L) 01/01/2022   CREATININE 0.71 01/01/2022   BILITOT 0.5 01/01/2022   ALKPHOS 105 08/11/2021   AST 22 01/01/2022   ALT 13 01/01/2022   PROT 7.2 01/01/2022   ALBUMIN 4.9 08/11/2021   CALCIUM 9.2 01/01/2022   GFRAA 113 04/02/2020   QFTBGOLD Negative 08/02/2015   QFTBGOLDPLUS NEGATIVE 02/27/2021    Speciality Comments: Prior therapy: Remicade and Humira (inadequate response) Osteoporosis managed by PCP. Last DXA August 2019.  Procedures:  No procedures performed Allergies: Codeine, Other, Penicillins, and Gluten meal    Assessment / Plan:     Visit Diagnoses: Psoriatic arthritis (HCC)-patient denies having a flare of psoriatic arthritis since the last visit.  She states she  had 1 episode of right shoulder joint discomfort which responded to prednisone taper.  She had no recurrence of shoulder joint pain.  She denies any history of dactylitis, Planter fasciitis, Achilles tendinitis or uveitis.  She has been taking Cosentyx and methotrexate on a regular basis without any interruption.  We also discussed possibly tapering methotrexate to 0.8 mL subcu weekly in the future.  Psoriasis -no flares since the last visit per patient.  No psoriasis lesions were noted today.  She had a flare in October 2023 while there was a gap in therapy.  High risk medication use - Cosentyx 150 mg every 14 days (started September 2019), methotrexate 1 mL every 7 days, and folic acid 1 mg 2 tablets daily. Inadequate response to remicade.  Labs are past due.  Labs from December 2023 were reviewed CBC and CMP were stable.  TB Gold was negative on February 27, 2021.  Obtaining labs every 3 months to monitor for drug toxicity was emphasized.  Will obtain labs today and then every 3 months.  Information on immunization was placed in the AVS.  She was advised to hold Centrix and methotrexate if she develops an infection.  She should resume both medications after the infection resolves.  Sicca complex (HCC)-she continues to have dry mouth and dry eyes.  Over-the-counter products were discussed.  Primary osteoarthritis of both hands she-psoriatic arthritis and osteoarthritis overlap with thickening and subluxation of several of the PIP and DIP joints.  DDD (degenerative disc disease), lumbar-she has intermittent disc vert in the lumbar spine.She had good mobility without any point tenderness today.  Age-related osteoporosis without current pathological fracture - DEXA scan on April 24, 2021 according to the report T score was -3.8 in the left radius.   She completed Tymlos prescribed by Dr. Susann Givens.  She had her first infusion of IV Reclast in December 2023.  She had no side effects.  Use of calcium rich diet, vitamin D and exercise was emphasized.  History of gastroesophageal reflux (GERD)  History of hypertension-blood pressure was elevated at  146/81 today.  Patient states she forgot to take her blood pressure medication today.  She will take the dose when she gets home.  History of hypothyroidism  History of colonic polyps  Smoker-smoking cessation was discussed.  Orders: Orders Placed This Encounter  Procedures   CBC with Differential/Platelet   COMPLETE METABOLIC PANEL WITH GFR   QuantiFERON-TB Gold Plus   No orders of the defined types were placed in this encounter.   Follow-Up Instructions: Return in about 5 months (around 11/10/2022) for Psoriatic arthritis, Osteoporosis.   Pollyann Savoy, MD  Note - This record has been created using Animal nutritionist.  Chart creation errors have been sought, but may not always  have been located. Such creation errors do not reflect on  the standard of medical care.

## 2022-06-10 ENCOUNTER — Ambulatory Visit: Payer: Medicare Other | Attending: Rheumatology | Admitting: Rheumatology

## 2022-06-10 ENCOUNTER — Encounter: Payer: Self-pay | Admitting: Rheumatology

## 2022-06-10 VITALS — BP 130/74 | HR 76 | Resp 14 | Ht 65.0 in | Wt 107.2 lb

## 2022-06-10 DIAGNOSIS — Z8679 Personal history of other diseases of the circulatory system: Secondary | ICD-10-CM | POA: Diagnosis not present

## 2022-06-10 DIAGNOSIS — Z8639 Personal history of other endocrine, nutritional and metabolic disease: Secondary | ICD-10-CM

## 2022-06-10 DIAGNOSIS — L4050 Arthropathic psoriasis, unspecified: Secondary | ICD-10-CM | POA: Diagnosis not present

## 2022-06-10 DIAGNOSIS — Z8719 Personal history of other diseases of the digestive system: Secondary | ICD-10-CM

## 2022-06-10 DIAGNOSIS — L409 Psoriasis, unspecified: Secondary | ICD-10-CM | POA: Diagnosis not present

## 2022-06-10 DIAGNOSIS — Z86010 Personal history of colonic polyps: Secondary | ICD-10-CM

## 2022-06-10 DIAGNOSIS — M5136 Other intervertebral disc degeneration, lumbar region: Secondary | ICD-10-CM

## 2022-06-10 DIAGNOSIS — M19041 Primary osteoarthritis, right hand: Secondary | ICD-10-CM | POA: Diagnosis not present

## 2022-06-10 DIAGNOSIS — M3500 Sicca syndrome, unspecified: Secondary | ICD-10-CM

## 2022-06-10 DIAGNOSIS — Z79899 Other long term (current) drug therapy: Secondary | ICD-10-CM | POA: Diagnosis not present

## 2022-06-10 DIAGNOSIS — M810 Age-related osteoporosis without current pathological fracture: Secondary | ICD-10-CM | POA: Diagnosis not present

## 2022-06-10 DIAGNOSIS — M19042 Primary osteoarthritis, left hand: Secondary | ICD-10-CM

## 2022-06-10 DIAGNOSIS — F17200 Nicotine dependence, unspecified, uncomplicated: Secondary | ICD-10-CM | POA: Diagnosis not present

## 2022-06-10 NOTE — Patient Instructions (Addendum)
Standing Labs We placed an order today for your standing lab work.   Please have your standing labs drawn in August and every 3 months  Please have your labs drawn 2 weeks prior to your appointment so that the provider can discuss your lab results at your appointment, if possible.  Please note that you may see your imaging and lab results in MyChart before we have reviewed them. We will contact you once all results are reviewed. Please allow our office up to 72 hours to thoroughly review all of the results before contacting the office for clarification of your results.  WALK-IN LAB HOURS  Monday through Thursday from 8:00 am -12:30 pm and 1:00 pm-5:00 pm and Friday from 8:00 am-12:00 pm.  Patients with office visits requiring labs will be seen before walk-in labs.  You may encounter longer than normal wait times. Please allow additional time. Wait times may be shorter on  Monday and Thursday afternoons.  We do not book appointments for walk-in labs. We appreciate your patience and understanding with our staff.   Labs are drawn by [facility]. Please bring your co-pay at the time of your lab draw.  You may receive a bill from [facility] for your lab work.  Please note if you are on Hydroxychloroquine and and an order has been placed for a Hydroxychloroquine level,  you will need to have it drawn 4 hours or more after your last dose.  If you wish to have your labs drawn at another location, please call the office 24 hours in advance so we can fax the orders.  The office is located at [address], [address], [place], [place] [address]   If you have any questions regarding directions or hours of operation,  please call [phone].   As a reminder, please drink plenty of water prior to coming for your lab work. Thanks!   If you have signs or symptoms of an infection or start antibiotics: First, call your PCP for workup of your infection. Hold your medication through the infection, until you  complete your antibiotics, and until symptoms resolve if you take the following: Injectable medication (Actemra, Benlysta, Cimzia, Cosentyx, Enbrel, Humira, Kevzara, Orencia, Remicade, Simponi, Stelara, Taltz, Tremfya) Methotrexate Leflunomide (Arava) Mycophenolate (Cellcept) Harriette Ohara, Olumiant, or Rinvoq  Vaccines You are taking a medication(s) that can suppress your immune system.  The following immunizations are recommended: Flu annually Covid-19  Td/Tdap (tetanus, diphtheria, pertussis) every 10 years Pneumonia (Prevnar 15 then Pneumovax 23 at least 1 year apart.  Alternatively, can take Prevnar 20 without needing additional dose) Shingrix: 2 doses from 4 weeks to 6 months apart  Please check with your PCP to make sure you are up to date.  Shoulder Exercises Ask your health care provider which exercises are safe for you. Do exercises exactly as told by your health care provider and adjust them as directed. It is normal to feel mild stretching, pulling, tightness, or discomfort as you do these exercises. Stop right away if you feel sudden pain or your pain gets worse. Do not begin these exercises until told by your health care provider. Stretching exercises External rotation and abduction This exercise is sometimes called corner stretch. The exercise rotates your arm outward (external rotation) and moves your arm out from your body (abduction). Stand in a doorway with one of your feet slightly in front of the other. This is called a staggered stance. If you cannot reach your forearms to the door frame, stand facing a corner  of a room. Choose one of the following positions as told by your health care provider: Place your hands and forearms on the door frame above your head. Place your hands and forearms on the door frame at the height of your head. Place your hands on the door frame at the height of your elbows. Slowly move your weight onto your front foot until you feel a stretch across  your chest and in the front of your shoulders. Keep your head and chest upright and keep your abdominal muscles tight. Hold for __________ seconds. To release the stretch, shift your weight to your back foot. Repeat __________ times. Complete this exercise __________ times a day. Extension, standing  Stand and hold a broomstick, a cane, or a similar object behind your back. Your hands should be a little wider than shoulder-width apart. Your palms should face away from your back. Keeping your elbows straight and your shoulder muscles relaxed, move the stick away from your body until you feel a stretch in your shoulders (extension). Avoid shrugging your shoulders while you move the stick. Keep your shoulder blades tucked down toward the middle of your back. Hold for __________ seconds. Slowly return to the starting position. Repeat __________ times. Complete this exercise __________ times a day. Range-of-motion exercises Pendulum  Stand near a wall or a surface that you can hold onto for balance. Bend at the waist and let your left / right arm hang straight down. Use your other arm to support you. Keep your back straight and do not lock your knees. Relax your left / right arm and shoulder muscles, and move your hips and your trunk so your left / right arm swings freely. Your arm should swing because of the motion of your body, not because you are using your arm or shoulder muscles. Keep moving your hips and trunk so your arm swings in the following directions, as told by your health care provider: Side to side. Forward and backward. In clockwise and counterclockwise circles. Continue each motion for __________ seconds, or for as long as told by your health care provider. Slowly return to the starting position. Repeat __________ times. Complete this exercise __________ times a day. Shoulder flexion, standing  Stand and hold a broomstick, a cane, or a similar object. Place your hands a  little more than shoulder-width apart on the object. Your left / right hand should be palm-up, and your other hand should be palm-down. Keep your elbow straight and your shoulder muscles relaxed. Push the stick up with your healthy arm to raise your left / right arm in front of your body, and then over your head until you feel a stretch in your shoulder (flexion). Avoid shrugging your shoulder while you raise your arm. Keep your shoulder blade tucked down toward the middle of your back. Hold for __________ seconds. Slowly return to the starting position. Repeat __________ times. Complete this exercise __________ times a day. Shoulder abduction, standing  Stand and hold a broomstick, a cane, or a similar object. Place your hands a little more than shoulder-width apart on the object. Your left / right hand should be palm-up, and your other hand should be palm-down. Keep your elbow straight and your shoulder muscles relaxed. Push the object across your body toward your left / right side. Raise your left / right arm to the side of your body (abduction) until you feel a stretch in your shoulder. Do not raise your arm above shoulder height unless your health care  provider tells you to do that. If directed, raise your arm over your head. Avoid shrugging your shoulder while you raise your arm. Keep your shoulder blade tucked down toward the middle of your back. Hold for __________ seconds. Slowly return to the starting position. Repeat __________ times. Complete this exercise __________ times a day. Internal rotation  Place your left / right hand behind your back, palm-up. Use your other hand to dangle an exercise band, a broomstick, or a similar object over your shoulder. Grasp the band with your left / right hand so you are holding on to both ends. Gently pull up on the band until you feel a stretch in the front of your left / right shoulder. The movement of your arm toward the center of your body is  called internal rotation. Avoid shrugging your shoulder while you raise your arm. Keep your shoulder blade tucked down toward the middle of your back. Hold for __________ seconds. Release the stretch by letting go of the band and lowering your hands. Repeat __________ times. Complete this exercise __________ times a day. Strengthening exercises External rotation  Sit in a stable chair without armrests. Secure an exercise band to a stable object at elbow height on your left / right side. Place a soft object, such as a folded towel or a small pillow, between your left / right upper arm and your body to move your elbow about 4 inches (10 cm) away from your side. Hold the end of the exercise band so it is tight and there is no slack. Keeping your elbow pressed against the soft object, slowly move your forearm out, away from your abdomen (external rotation). Keep your body steady so only your forearm moves. Hold for __________ seconds. Slowly return to the starting position. Repeat __________ times. Complete this exercise __________ times a day. Shoulder abduction  Sit in a stable chair without armrests, or stand up. Hold a __________ lb / kg weight in your left / right hand, or hold an exercise band with both hands. Start with your arms straight down and your left / right palm facing in, toward your body. Slowly lift your left / right hand out to your side (abduction). Do not lift your hand above shoulder height unless your health care provider tells you that this is safe. Keep your arms straight. Avoid shrugging your shoulder while you do this movement. Keep your shoulder blade tucked down toward the middle of your back. Hold for __________ seconds. Slowly lower your arm, and return to the starting position. Repeat __________ times. Complete this exercise __________ times a day. Shoulder extension  Sit in a stable chair without armrests, or stand up. Secure an exercise band to a stable  object in front of you so it is at shoulder height. Hold one end of the exercise band in each hand. Straighten your elbows and lift your hands up to shoulder height. Squeeze your shoulder blades together as you pull your hands down to the sides of your thighs (extension). Stop when your hands are straight down by your sides. Do not let your hands go behind your body. Hold for __________ seconds. Slowly return to the starting position. Repeat __________ times. Complete this exercise __________ times a day. Shoulder row  Sit in a stable chair without armrests, or stand up. Secure an exercise band to a stable object in front of you so it is at chest height. Hold one end of the exercise band in each hand. Position your  palms so that your thumbs are facing the ceiling (neutral position). Bend each of your elbows to a 90-degree angle (right angle) and keep your upper arms at your sides. Step back or move the chair back until the band is tight and there is no slack. Slowly pull your elbows back behind you. Hold for __________ seconds. Slowly return to the starting position. Repeat __________ times. Complete this exercise __________ times a day. Shoulder press-ups  Sit in a stable chair that has armrests. Sit upright, with your feet flat on the floor. Put your hands on the armrests so your elbows are bent and your fingers are pointing forward. Your hands should be about even with the sides of your body. Push down on the armrests and use your arms to lift yourself off the chair. Straighten your elbows and lift yourself up as much as you comfortably can. Move your shoulder blades down, and avoid letting your shoulders move up toward your ears. Keep your feet on the ground. As you get stronger, your feet should support less of your body weight as you lift yourself up. Hold for __________ seconds. Slowly lower yourself back into the chair. Repeat __________ times. Complete this exercise __________  times a day. Wall push-ups  Stand so you are facing a stable wall. Your feet should be about one arm-length away from the wall. Lean forward and place your palms on the wall at shoulder height. Keep your feet flat on the floor as you bend your elbows and lean forward toward the wall. Hold for __________ seconds. Straighten your elbows to push yourself back to the starting position. Repeat __________ times. Complete this exercise __________ times a day. This information is not intended to replace advice given to you by your health care provider. Make sure you discuss any questions you have with your health care provider. Document Revised: 02/25/2021 Document Reviewed: 02/25/2021 Elsevier Patient Education  2023 [employer].

## 2022-06-11 NOTE — Progress Notes (Signed)
CBC and CMP are normal.

## 2022-06-12 LAB — COMPLETE METABOLIC PANEL WITH GFR
AG Ratio: 1.6 (calc) (ref 1.0–2.5)
ALT: 9 U/L (ref 6–29)
AST: 16 U/L (ref 10–35)
Albumin: 4.1 g/dL (ref 3.6–5.1)
Alkaline phosphatase (APISO): 87 U/L (ref 37–153)
BUN/Creatinine Ratio: 8 (calc) (ref 6–22)
BUN: 5 mg/dL — ABNORMAL LOW (ref 7–25)
CO2: 26 mmol/L (ref 20–32)
Calcium: 9.3 mg/dL (ref 8.6–10.4)
Chloride: 104 mmol/L (ref 98–110)
Creat: 0.64 mg/dL (ref 0.50–1.05)
Globulin: 2.5 g/dL (calc) (ref 1.9–3.7)
Glucose, Bld: 89 mg/dL (ref 65–99)
Potassium: 4.5 mmol/L (ref 3.5–5.3)
Sodium: 140 mmol/L (ref 135–146)
Total Bilirubin: 0.5 mg/dL (ref 0.2–1.2)
Total Protein: 6.6 g/dL (ref 6.1–8.1)
eGFR: 96 mL/min/{1.73_m2} (ref >=60)

## 2022-06-12 LAB — CBC WITH DIFFERENTIAL/PLATELET
Absolute Monocytes: 369 cells/uL (ref 200–950)
Basophils Absolute: 8 cells/uL (ref 0–200)
Basophils Relative: 0.1 %
Eosinophils Absolute: 33 cells/uL (ref 15–500)
Eosinophils Relative: 0.4 %
HCT: 37.3 % (ref 35.0–45.0)
Hemoglobin: 12.4 g/dL (ref 11.7–15.5)
Lymphs Abs: 2977 cells/uL (ref 850–3900)
MCH: 32 pg (ref 27.0–33.0)
MCHC: 33.2 g/dL (ref 32.0–36.0)
MCV: 96.4 fL (ref 80.0–100.0)
MPV: 10.1 fL (ref 7.5–12.5)
Monocytes Relative: 4.5 %
Neutro Abs: 4813 cells/uL (ref 1500–7800)
Neutrophils Relative %: 58.7 %
Platelets: 297 10*3/uL (ref 140–400)
RBC: 3.87 10*6/uL (ref 3.80–5.10)
RDW: 12.9 % (ref 11.0–15.0)
Total Lymphocyte: 36.3 %
WBC: 8.2 10*3/uL (ref 3.8–10.8)

## 2022-06-12 LAB — QUANTIFERON-TB GOLD PLUS
Mitogen-NIL: 7.99 IU/mL
NIL: 0.02 IU/mL
QuantiFERON-TB Gold Plus: NEGATIVE
TB1-NIL: 0 IU/mL
TB2-NIL: 0 IU/mL

## 2022-06-16 DIAGNOSIS — Z87440 Personal history of urinary (tract) infections: Secondary | ICD-10-CM | POA: Diagnosis not present

## 2022-06-16 NOTE — Progress Notes (Signed)
TB Gold is negative.

## 2022-07-02 ENCOUNTER — Encounter (HOSPITAL_COMMUNITY): Payer: Self-pay

## 2022-07-02 ENCOUNTER — Other Ambulatory Visit: Payer: Self-pay

## 2022-07-02 ENCOUNTER — Emergency Department (HOSPITAL_COMMUNITY)
Admission: EM | Admit: 2022-07-02 | Discharge: 2022-07-03 | Disposition: A | Discharge status: Home / Self Care | Payer: Medicare Other | Attending: Emergency Medicine | Admitting: Emergency Medicine

## 2022-07-02 DIAGNOSIS — T7840XA Allergy, unspecified, initial encounter: Secondary | ICD-10-CM

## 2022-07-02 DIAGNOSIS — E039 Hypothyroidism, unspecified: Secondary | ICD-10-CM | POA: Insufficient documentation

## 2022-07-02 DIAGNOSIS — Z79899 Other long term (current) drug therapy: Secondary | ICD-10-CM | POA: Insufficient documentation

## 2022-07-02 DIAGNOSIS — R0602 Shortness of breath: Secondary | ICD-10-CM | POA: Insufficient documentation

## 2022-07-02 DIAGNOSIS — R000 Tachycardia, unspecified: Secondary | ICD-10-CM | POA: Diagnosis not present

## 2022-07-02 DIAGNOSIS — I10 Essential (primary) hypertension: Secondary | ICD-10-CM | POA: Insufficient documentation

## 2022-07-02 MED ORDER — METHYLPREDNISOLONE SODIUM SUCC 125 MG IJ SOLR
125.0000 mg | Freq: Once | INTRAMUSCULAR | Status: AC
  Administered 2022-07-02: 125 mg via INTRAVENOUS
  Filled 2022-07-02: qty 2

## 2022-07-02 MED ORDER — FAMOTIDINE IN NACL 20-0.9 MG/50ML-% IV SOLN
20.0000 mg | Freq: Once | INTRAVENOUS | Status: AC
  Administered 2022-07-02: 20 mg via INTRAVENOUS
  Filled 2022-07-02: qty 50

## 2022-07-02 MED ORDER — DIPHENHYDRAMINE HCL 50 MG/ML IJ SOLN
25.0000 mg | Freq: Once | INTRAMUSCULAR | Status: AC
  Administered 2022-07-02: 25 mg via INTRAVENOUS
  Filled 2022-07-02: qty 1

## 2022-07-02 NOTE — ED Triage Notes (Addendum)
Patient reports sudden onset of SOB, was possibly bitten by something tonight, used EPI pen pta. Also has bites on back of neck/back for the past few days, unsure of what. Is allergic to bees. States epi pen has helped some. Tachypneic in triage with some tachycardia.

## 2022-07-02 NOTE — ED Provider Notes (Signed)
[place] EMERGENCY DEPARTMENT AT [facility] Provider Note   CSN: [id number] Arrival date & time: 07/02/22  2140     History  Chief Complaint  Patient presents with   Shortness of Breath   Allergic Reaction    April Cox is a 67 y.o. female.  The history is provided by the patient and medical records.  Shortness of Breath Allergic Reaction  67 year old female with history of hypertension, GERD, hyperlipidemia, hypothyroidism, osteoporosis, psoriatic arthritis, presenting to the ED for allergic reaction.  Patient sustained some bug bites to the back of her neck 2 days ago and has had some itching of that area since.  Tonight she was sitting on screen portion and felt like she was stung by something on her left leg.  Immediately had onset of shortness of breath.  She administered EpiPen around 9:30 PM and symptoms have since improved.  She is highly allergic to wasps so suspect this is what it is.  She denies any nausea or vomiting.  Denies any current shortness of breath.  Home Medications Prior to Admission medications   Medication Sig Start Date End Date Taking? Authorizing Provider  Abaloparatide (TYMLOS) 3120 MCG/1.56ML SOPN Inject 80 mcg into the skin daily. Patient not taking: Reported on 07/31/2021 10/05/18   Ronnald Nian, MD  atorvastatin (LIPITOR) 40 MG tablet Take 1 tablet (40 mg total) by mouth daily. 08/12/21   Ronnald Nian, MD  benzonatate (TESSALON) 100 MG capsule Take 2 capsules (200 mg total) by mouth 2 (two) times daily as needed for cough. 10/21/21   Ronnald Nian, MD  calcium-vitamin D (OSCAL WITH D) 500-200 MG-UNIT per tablet Take 1 tablet by mouth 2 (two) times daily.    [provider]  Cholecalciferol (VITAMIN D PO) Take 500 mg by mouth daily.     [provider]  clobetasol ointment (TEMOVATE) 0.05 % Apply 1 application  topically as needed (psiorisis). 09/11/14   [provider]  CRANBERRY PO Take by mouth daily.     [provider]  EPINEPHrine 0.3 mg/0.3 mL IJ SOAJ injection INJECT INTO THE MIDDLE OF THE OUTER THIGH AND HOLD FOR 10 SECONDS AS NEEDED FOR SEVERE ALLERGIC REACTION THEN CALL 911 IF USED 12/22/21   Ronnald Nian, MD  Estradiol 10 MCG TABS vaginal tablet Place vaginally. 05/19/22   [provider]  folic acid (FOLVITE) 1 MG tablet TAKE TWO TABLETS BY MOUTH DAILY 11/17/21   Gearldine Bienenstock, PA-C  levothyroxine (SYNTHROID) 25 MCG tablet TAKE ONE TABLET BY MOUTH DAILY BEFORE BREAKFAST 08/12/21   Ronnald Nian, MD  melatonin 5 MG TABS Take 5 mg by mouth.    [provider]  methotrexate 250 MG/10ML injection INJECT 1 MILLILITER (CC) UNDER THE SKIN ONCE A WEEK 01/14/22   Gearldine Bienenstock, PA-C  metoprolol succinate (TOPROL-XL) 50 MG 24 hr tablet TAKE 1 TABLET BY MOUTH DAILY IMMEDIATELY FOLLOWING A MEAL 01/20/22   Ronnald Nian, MD  MONOJECT 1CC TB SYR 27GX1/2" 27G X 1/2" 1 ML MISC USE AS DIRECTED 04/13/22   Gearldine Bienenstock, PA-C  Multiple Vitamins-Minerals (OCUVITE PO) Take 1 tablet by mouth 2 (two) times daily.    [provider]  pantoprazole (PROTONIX) 40 MG tablet TAKE 1 TABLET BY MOUTH DAILY 04/13/22   Ronnald Nian, MD  Polyvinyl Alcohol-Povidone (REFRESH OP) Apply to eye daily.    [provider]  promethazine-dextromethorphan (PROMETHAZINE-DM) 6.25-15 MG/5ML syrup Take 5 mLs by mouth  4 (four) times daily as needed for cough. Patient not taking: Reported on 01/01/2022 10/17/21   Tysinger, Kermit Balo, PA-C  Secukinumab, 300 MG Dose, (COSENTYX SENSOREADY, 300 MG,) 150 MG/ML SOAJ INJECT 1 PEN UNDER THE SKIN (SUBCUTANEOUS INJECTION) EVERY 14 DAYS 09/29/19   Gearldine Bienenstock, PA-C  vitamin C (ASCORBIC ACID) 250 MG tablet Take 250 mg by mouth daily.    [provider]  XIIDRA 5 % SOLN 2 (two) times daily. 03/22/17   [provider]  Zoledronic Acid (RECLAST IV) Inject into the vein. First IV: 12/2021    [provider]      Allergies     Bee venom, Codeine, Other, Penicillins, and Gluten meal    Review of Systems   Review of Systems  Respiratory:  Positive for shortness of breath.   All other systems reviewed and are negative.   Physical Exam Updated Vital Signs BP 132/84   Pulse 87   Temp 98.2 F (36.8 C) (Oral)   Resp (!) 21   Ht 5\' 6"  (1.676 m)   Wt 49.9 kg   SpO2 100%   BMI 17.75 kg/m  Physical Exam Vitals and nursing note reviewed.  Constitutional:      Appearance: She is well-developed.  HENT:     Head: Normocephalic and atraumatic.     Mouth/Throat:     Comments: No lip/tongue swelling, handling secretions well, no stridor Eyes:     Conjunctiva/sclera: Conjunctivae normal.     Pupils: Pupils are equal, round, and reactive to light.  Neck:     Comments: Insect bites to back of neck, no superimposed infection Cardiovascular:     Rate and Rhythm: Normal rate and regular rhythm.     Heart sounds: Normal heart sounds.  Pulmonary:     Effort: Pulmonary effort is normal.     Breath sounds: Normal breath sounds.  Abdominal:     General: Bowel sounds are normal.     Palpations: Abdomen is soft.  Musculoskeletal:        General: Normal range of motion.     Cervical back: Normal range of motion.  Skin:    General: Skin is warm and dry.     Comments: Sting mark noted to left thigh, site of IM injection as well, no cellulitic changes  Neurological:     Mental Status: She is alert and oriented to person, place, and time.     ED Results / Procedures / Treatments   Labs (all labs ordered are listed, but only abnormal results are displayed) Labs Reviewed - No data to display  EKG None  Radiology No results found.  Procedures Procedures    Medications Ordered in ED Medications  diphenhydrAMINE (BENADRYL) injection 25 mg (25 mg Intravenous Given 07/02/22 2226)  methylPREDNISolone sodium succinate (SOLU-MEDROL) 125 mg/2 mL injection 125 mg (125 mg Intravenous Given 07/02/22 2226)  famotidine  (PEPCID) IVPB 20 mg premix (20 mg Intravenous New Bag/Given 07/02/22 2227)    ED Course/ Medical Decision Making/ A&P                             Medical Decision Making Risk Prescription drug management.   67 y.o. F here with allergic reaction after being stung while on the porch.  Bug bites a few days ago but no reaction like this until today.  She is highly allergic to wasps.  Sounds like symptoms began immediately after sitting, however have  improved after her epi which she took around 9:30 PM.  She does not have any lip or tongue swelling, handling secretions well, no stridor.  No bodily rash.  Still complains of some throat irritation and sensation of tongue thickness.  Will give Benadryl, Solu-Medrol, Pepcid.  12:26 AM On re-check, patient sleepy from benadryl but remains conversant.  States she is feeling better.  Remains without airway compromise.  Stable for discharge.  Have sent in refill of her epi pen to pharmacy if needed.  Will have her follow-up with PCP.  She can return here for any new/acute changes.  Final Clinical Impression(s) / ED Diagnoses Final diagnoses:  Allergic reaction, initial encounter    Rx / DC Orders ED Discharge Orders          Ordered    EPINEPHrine 0.3 mg/0.3 mL IJ SOAJ injection  As needed        07/03/22 0025              Garlon Hatchet, PA-C 07/03/22 0041    Glyn Ade, MD 07/06/22 [phone]

## 2022-07-03 MED ORDER — EPINEPHRINE 0.3 MG/0.3ML IJ SOAJ: 0.3000 mg | INTRAMUSCULAR | 0 refills | Status: DC | PRN

## 2022-07-03 NOTE — Discharge Instructions (Addendum)
I have sent another epi pen to the pharmacy if needed. Follow-up with your primary care doctor. Return here for new concerns.

## 2022-07-08 ENCOUNTER — Other Ambulatory Visit: Payer: Self-pay | Admitting: Family Medicine

## 2022-07-08 DIAGNOSIS — K219 Gastro-esophageal reflux disease without esophagitis: Secondary | ICD-10-CM

## 2022-07-08 NOTE — Telephone Encounter (Signed)
Is it ok to refill? 

## 2022-07-11 ENCOUNTER — Other Ambulatory Visit: Payer: Self-pay | Admitting: Family Medicine

## 2022-07-11 DIAGNOSIS — E038 Other specified hypothyroidism: Secondary | ICD-10-CM

## 2022-07-16 DIAGNOSIS — Z79899 Other long term (current) drug therapy: Secondary | ICD-10-CM | POA: Diagnosis not present

## 2022-07-19 ENCOUNTER — Other Ambulatory Visit: Payer: Self-pay | Admitting: Family Medicine

## 2022-07-19 DIAGNOSIS — E038 Other specified hypothyroidism: Secondary | ICD-10-CM

## 2022-07-22 ENCOUNTER — Encounter: Payer: Self-pay | Admitting: Gastroenterology

## 2022-07-26 ENCOUNTER — Other Ambulatory Visit: Payer: Self-pay | Admitting: Family Medicine

## 2022-07-26 DIAGNOSIS — I10 Essential (primary) hypertension: Secondary | ICD-10-CM

## 2022-07-28 DIAGNOSIS — Z79899 Other long term (current) drug therapy: Secondary | ICD-10-CM | POA: Diagnosis not present

## 2022-07-30 DIAGNOSIS — Z79899 Other long term (current) drug therapy: Secondary | ICD-10-CM | POA: Diagnosis not present

## 2022-08-03 ENCOUNTER — Other Ambulatory Visit: Payer: Self-pay | Admitting: Family Medicine

## 2022-08-03 DIAGNOSIS — E785 Hyperlipidemia, unspecified: Secondary | ICD-10-CM

## 2022-08-13 ENCOUNTER — Telehealth (HOSPITAL_COMMUNITY): Payer: Self-pay

## 2022-08-13 ENCOUNTER — Telehealth (HOSPITAL_COMMUNITY): Payer: Self-pay | Admitting: Licensed Clinical Social Worker

## 2022-08-13 NOTE — Telephone Encounter (Signed)
Chales Abrahams called and left a VM.

## 2022-08-13 NOTE — Telephone Encounter (Addendum)
Therapist calls Chales Abrahams and verifies her identity by obtaining two identifiers.  Therapist inquired as to what type of services she was looking for.  Chales Abrahams says she is looking for a CD-IOP program.  Therapist explains that at the current time, the CD-IOP is full.  Laurieanne Galloway asks if there are any other IOPs in her area. Therapist gives her information and local CD-IOP programs.  Chesni Vos says she will take it from here.

## 2022-08-13 NOTE — Telephone Encounter (Signed)
Called and left a voice mail.  Remigio Eisenmenger, MS. LMFT, LCAS 08-13-22

## 2022-08-14 ENCOUNTER — Other Ambulatory Visit: Payer: Self-pay | Admitting: Physician Assistant

## 2022-08-14 NOTE — Telephone Encounter (Signed)
Last Fill: 01/14/2022  Labs: 06/10/2022 CBC and CMP are normal.   Next Visit: 11/10/2022  Last Visit: 06/10/2022  DX: Psoriatic arthritis   Current Dose per office note on 06/10/2022: methotrexate 1 mL every 7 days   Okay to refill Methotrexate?

## 2022-08-26 ENCOUNTER — Other Ambulatory Visit: Payer: Self-pay | Admitting: Family Medicine

## 2022-08-26 ENCOUNTER — Encounter: Payer: Self-pay | Admitting: Internal Medicine

## 2022-08-26 DIAGNOSIS — I10 Essential (primary) hypertension: Secondary | ICD-10-CM

## 2022-09-02 ENCOUNTER — Other Ambulatory Visit: Payer: Self-pay | Admitting: Family Medicine

## 2022-09-02 DIAGNOSIS — E785 Hyperlipidemia, unspecified: Secondary | ICD-10-CM

## 2022-09-08 ENCOUNTER — Ambulatory Visit (INDEPENDENT_AMBULATORY_CARE_PROVIDER_SITE_OTHER): Payer: Medicare Other | Admitting: Family Medicine

## 2022-09-08 ENCOUNTER — Encounter: Payer: Self-pay | Admitting: Family Medicine

## 2022-09-08 VITALS — BP 150/80 | HR 68 | Temp 97.6°F | Resp 14 | Ht 66.0 in | Wt 111.0 lb

## 2022-09-08 DIAGNOSIS — Z860100 Personal history of colon polyps, unspecified: Secondary | ICD-10-CM

## 2022-09-08 DIAGNOSIS — F17210 Nicotine dependence, cigarettes, uncomplicated: Secondary | ICD-10-CM

## 2022-09-08 DIAGNOSIS — E038 Other specified hypothyroidism: Secondary | ICD-10-CM

## 2022-09-08 DIAGNOSIS — M47816 Spondylosis without myelopathy or radiculopathy, lumbar region: Secondary | ICD-10-CM

## 2022-09-08 DIAGNOSIS — K219 Gastro-esophageal reflux disease without esophagitis: Secondary | ICD-10-CM

## 2022-09-08 DIAGNOSIS — M810 Age-related osteoporosis without current pathological fracture: Secondary | ICD-10-CM | POA: Diagnosis not present

## 2022-09-08 DIAGNOSIS — I350 Nonrheumatic aortic (valve) stenosis: Secondary | ICD-10-CM | POA: Diagnosis not present

## 2022-09-08 DIAGNOSIS — M51369 Other intervertebral disc degeneration, lumbar region without mention of lumbar back pain or lower extremity pain: Secondary | ICD-10-CM

## 2022-09-08 DIAGNOSIS — E782 Mixed hyperlipidemia: Secondary | ICD-10-CM

## 2022-09-08 DIAGNOSIS — L409 Psoriasis, unspecified: Secondary | ICD-10-CM | POA: Diagnosis not present

## 2022-09-08 DIAGNOSIS — I10 Essential (primary) hypertension: Secondary | ICD-10-CM | POA: Diagnosis not present

## 2022-09-08 DIAGNOSIS — E785 Hyperlipidemia, unspecified: Secondary | ICD-10-CM | POA: Diagnosis not present

## 2022-09-08 DIAGNOSIS — L4050 Arthropathic psoriasis, unspecified: Secondary | ICD-10-CM

## 2022-09-08 DIAGNOSIS — M5136 Other intervertebral disc degeneration, lumbar region: Secondary | ICD-10-CM

## 2022-09-08 DIAGNOSIS — Z0000 Encounter for general adult medical examination without abnormal findings: Secondary | ICD-10-CM | POA: Diagnosis not present

## 2022-09-08 DIAGNOSIS — F1011 Alcohol abuse, in remission: Secondary | ICD-10-CM

## 2022-09-08 LAB — COMPREHENSIVE METABOLIC PANEL
ALT: 16 IU/L (ref 0–32)
AST: 19 IU/L (ref 0–40)
Albumin: 4.3 g/dL (ref 3.9–4.9)
Alkaline Phosphatase: 89 IU/L (ref 44–121)
BUN/Creatinine Ratio: 5 — ABNORMAL LOW (ref 12–28)
BUN: 3 mg/dL — ABNORMAL LOW (ref 8–27)
Bilirubin Total: 0.5 mg/dL (ref 0.0–1.2)
CO2: 24 mmol/L (ref 20–29)
Calcium: 9.8 mg/dL (ref 8.7–10.3)
Chloride: 103 mmol/L (ref 96–106)
Creatinine, Ser: 0.63 mg/dL (ref 0.57–1.00)
Globulin, Total: 2.3 g/dL (ref 1.5–4.5)
Glucose: 85 mg/dL (ref 70–99)
Potassium: 4.7 mmol/L (ref 3.5–5.2)
Sodium: 139 mmol/L (ref 134–144)
Total Protein: 6.6 g/dL (ref 6.0–8.5)
eGFR: 96 mL/min/{1.73_m2} (ref >59)

## 2022-09-08 LAB — CBC WITH DIFFERENTIAL/PLATELET
Basophils Absolute: 0 10*3/uL (ref 0.0–0.2)
Basos: 0 %
EOS (ABSOLUTE): 0.1 10*3/uL (ref 0.0–0.4)
Eos: 1 %
Hematocrit: 39 % (ref 34.0–46.6)
Hemoglobin: 12.5 g/dL (ref 11.1–15.9)
Immature Grans (Abs): 0 10*3/uL (ref 0.0–0.1)
Immature Granulocytes: 0 %
Lymphocytes Absolute: 2.5 10*3/uL (ref 0.7–3.1)
Lymphs: 37 %
MCH: 30.8 pg (ref 26.6–33.0)
MCHC: 32.1 g/dL (ref 31.5–35.7)
MCV: 96 fL (ref 79–97)
Monocytes Absolute: 0.2 10*3/uL (ref 0.1–0.9)
Monocytes: 4 %
Neutrophils Absolute: 3.8 10*3/uL (ref 1.4–7.0)
Neutrophils: 58 %
Platelets: 293 10*3/uL (ref 150–450)
RBC: 4.06 x10E6/uL (ref 3.77–5.28)
RDW: 12.2 % (ref 11.7–15.4)
WBC: 6.6 10*3/uL (ref 3.4–10.8)

## 2022-09-08 LAB — LIPID PANEL
Chol/HDL Ratio: 4.7 ratio — ABNORMAL HIGH (ref 0.0–4.4)
Cholesterol, Total: 229 mg/dL — ABNORMAL HIGH (ref 100–199)
HDL: 49 mg/dL (ref >39)
LDL Chol Calc (NIH): 151 mg/dL — ABNORMAL HIGH (ref 0–99)
Triglycerides: 158 mg/dL — ABNORMAL HIGH (ref 0–149)
VLDL Cholesterol Cal: 29 mg/dL (ref 5–40)

## 2022-09-08 LAB — TSH: TSH: 2.58 u[IU]/mL (ref 0.450–4.500)

## 2022-09-08 MED ORDER — METOPROLOL SUCCINATE ER 50 MG PO TB24
ORAL_TABLET | ORAL | 3 refills | Status: DC
Start: 2022-09-08 — End: 2023-04-27

## 2022-09-08 MED ORDER — LEVOTHYROXINE SODIUM 25 MCG PO TABS
ORAL_TABLET | ORAL | 3 refills | Status: DC
Start: 2022-09-08 — End: 2023-04-27

## 2022-09-08 MED ORDER — PANTOPRAZOLE SODIUM 40 MG PO TBEC: 40.0000 mg | DELAYED_RELEASE_TABLET | Freq: Every day | ORAL | 3 refills | Status: DC

## 2022-09-08 MED ORDER — ATORVASTATIN CALCIUM 40 MG PO TABS: 40.0000 mg | ORAL_TABLET | Freq: Every day | ORAL | 3 refills | Status: DC

## 2022-09-08 NOTE — Progress Notes (Signed)
   Subjective:    Patient ID: April Cox, female    DOB: [birth year], 67 y.o.   MRN: [id number]  HPI Today for complete examination.  She recently voluntarily committed herself due to alcohol issues and is now in intensive outpatient therapy as well as also going to [employer]  She continues to smoke but would like to work on that at a later date.  She feels very good about this.  She has a colonoscopy scheduled for October.  She continues on Reclast given to her by her gynecologist for her osteoporosis.  She has had no chest pain, shortness of breath or syncopal episodes and does follow-up regularly with cardiology concerning her AAS.  She continues on atorvastatin without difficulty.  Is also taking metoprolol.  She continues to be followed by rheumatology for her psoriatic arthritis.  She also continues on Synthroid without difficulty.  Her back is not giving her any difficulty.  Otherwise she has no particular concerns or complaints.   Review of Systems  All other systems reviewed and are negative.      Objective:    Physical Exam Alert and in no distress. Tympanic membranes and canals are normal. Pharyngeal area is normal. Neck is supple without adenopathy or thyromegaly. Cardiac exam shows a regular sinus rhythm without murmurs or gallops. Lungs are clear to auscultation.        Assessment & Plan:   Problem List Items Addressed This Visit     Cigarette smoker   DDD (degenerative disc disease), lumbar   Essential hypertension   Relevant Medications   atorvastatin (LIPITOR) 40 MG tablet   metoprolol succinate (TOPROL-XL) 50 MG 24 hr tablet   Other Relevant Orders   CBC with Differential/Platelet   Comprehensive metabolic panel   Gastroesophageal reflux disease without esophagitis   Relevant Medications   pantoprazole (PROTONIX) 40 MG tablet   History of colonic polyps   Hyperlipidemia   Relevant Medications   atorvastatin (LIPITOR) 40 MG tablet   metoprolol succinate  (TOPROL-XL) 50 MG 24 hr tablet   Other Relevant Orders   Lipid panel   Hypothyroidism   Relevant Medications   levothyroxine (SYNTHROID) 25 MCG tablet   metoprolol succinate (TOPROL-XL) 50 MG 24 hr tablet   Other Relevant Orders   TSH   Nonrheumatic aortic valve stenosis   Relevant Medications   atorvastatin (LIPITOR) 40 MG tablet   metoprolol succinate (TOPROL-XL) 50 MG 24 hr tablet   Osteoporosis   Psoriasis   Psoriatic arthritis (HCC)   Spondylosis of lumbar region without myelopathy or radiculopathy   Other Visit Diagnoses     Routine general medical examination at a health care facility    -  Primary   History of alcohol abuse         She will continue on her present medication regimen I congratulated her on voluntarily committing herself to get help with her alcohol and encouraged her to continue with going to meetings and in counseling.

## 2022-09-09 ENCOUNTER — Encounter: Payer: Self-pay | Admitting: Gastroenterology

## 2022-09-17 ENCOUNTER — Other Ambulatory Visit: Payer: Medicare Other

## 2022-10-06 ENCOUNTER — Ambulatory Visit (INDEPENDENT_AMBULATORY_CARE_PROVIDER_SITE_OTHER): Payer: Medicare Other

## 2022-10-06 DIAGNOSIS — Z0000 Encounter for general adult medical examination without abnormal findings: Secondary | ICD-10-CM | POA: Diagnosis not present

## 2022-10-06 NOTE — Progress Notes (Signed)
Subjective:   April Cox is a 67 y.o. female who presents for Medicare Annual (Subsequent) preventive examination.  Visit Complete: Virtual  I connected with  April Cox on 10/06/22 by a audio enabled telemedicine application and verified that I am speaking with the correct person using two identifiers.  Patient Location: Home  Provider Location: Office/Clinic  I discussed the limitations of evaluation and management by telemedicine. The patient expressed understanding and agreed to proceed.  Patient Medicare AWV questionnaire was completed by the patient on 10/02/2022; I have confirmed that all information answered by patient is correct and no changes since this date.  Vital Signs: Unable to obtain new vitals due to this being a telehealth visit.  Cardiac Risk Factors include: advanced age (>83men, >50 women);dyslipidemia;hypertension     Objective:    Today's Vitals   10/06/22 0841  PainSc: 4    There is no height or weight on file to calculate BMI.     10/06/2022    8:47 AM 07/02/2022    9:46 PM 08/01/2021    9:38 AM 06/28/2020    9:37 AM 09/19/2017   11:11 AM 09/15/2017    2:32 AM 12/02/2016    9:12 PM  Advanced Directives  Does Patient Have a Medical Advance Directive? Yes No Yes Yes No No No  Type of Estate agent of [place];Living will  Healthcare Power of [place];Living will Living will;Healthcare Power of Attorney     Does patient want to make changes to medical advance directive?    No - Patient declined     Copy of Healthcare Power of Attorney in Chart? Yes - validated most recent copy scanned in chart (See row information)  Yes - validated most recent copy scanned in chart (See row information) Yes - validated most recent copy scanned in chart (See row information)     Would patient like information on creating a medical advance directive?     No - Patient declined  No - Patient declined    Current Medications (verified) Outpatient  Encounter Medications as of 10/06/2022  Medication Sig   atorvastatin (LIPITOR) 40 MG tablet Take 1 tablet (40 mg total) by mouth daily.   calcium-vitamin D (OSCAL WITH D) 500-200 MG-UNIT per tablet Take 1 tablet by mouth 2 (two) times daily.   Cholecalciferol (VITAMIN D PO) Take 500 mg by mouth daily.    clobetasol ointment (TEMOVATE) 0.05 % Apply 1 application  topically as needed (psiorisis).   CRANBERRY PO Take by mouth daily.   EPINEPHrine 0.3 mg/0.3 mL IJ SOAJ injection Inject 0.3 mg into the muscle as needed for anaphylaxis.   Estradiol 10 MCG TABS vaginal tablet Place vaginally.   folic acid (FOLVITE) 1 MG tablet TAKE TWO TABLETS BY MOUTH DAILY   levothyroxine (SYNTHROID) 25 MCG tablet TAKE 1 TABLET BY MOUTH DAILY BEFORE BREAKFAST   methotrexate 50 MG/2ML injection INJECT 1 MILLILITER (CC) UNDER THE SKIN ONCE WEEKLY   metoprolol succinate (TOPROL-XL) 50 MG 24 hr tablet TAKE 1 TABLET BY MOUTH DAILY IMMEDIATELY FOLLOWING A MEAL   MONOJECT 1CC TB SYR 27GX1/2" 27G X 1/2" 1 ML MISC USE AS DIRECTED   pantoprazole (PROTONIX) 40 MG tablet Take 1 tablet (40 mg total) by mouth daily.   Polyvinyl Alcohol-Povidone (REFRESH OP) Apply to eye daily.   Secukinumab, 300 MG Dose, (COSENTYX SENSOREADY, 300 MG,) 150 MG/ML SOAJ INJECT 1 PEN UNDER THE SKIN (SUBCUTANEOUS INJECTION) EVERY 14 DAYS   vitamin C (ASCORBIC ACID) 250  MG tablet Take 250 mg by mouth daily.   XIIDRA 5 % SOLN 2 (two) times daily.   Zoledronic Acid (RECLAST IV) Inject into the vein. First IV: 12/2021   melatonin 5 MG TABS Take 5 mg by mouth. (Patient not taking: Reported on 09/08/2022)   No facility-administered encounter medications on file as of 10/06/2022.    Allergies (verified) Bee venom, Codeine, Other, Penicillins, and Gluten meal   History: Past Medical History:  Diagnosis Date   Abnormal Pap smear of cervix    Anemia    history of anemia   Arthritis    Cataract    Diverticulosis of colon    Family history of adverse  reaction to anesthesia    mother PONV   GERD (gastroesophageal reflux disease)    Heart murmur    asymptomatic per pt   History of adenomatous polyp of colon    11-06-2009  tubullar   HPV in female    Hyperlipidemia    Hypertension    Hypothyroidism    Irritable bladder    per patient   Mild aortic valve stenosis    per echo 10-09-2010  ef 60-65%  valva area 1.48cm   Osteoporosis    Psoriasis    Psoriatic arthritis (HCC)    Dr. Corliss Cox   Wears glasses    Past Surgical History:  Procedure Laterality Date   ABDOMINAL HYSTERECTOMY     ANTERIOR LAT LUMBAR FUSION  12/25/2011   Procedure: ANTERIOR LATERAL LUMBAR FUSION 1 LEVEL;  Surgeon: April Alert, MD;  Location: [facility];  Service: Neurosurgery;  Laterality: Left;  LUMBAR FOUR-FIVE   BLADDER SUSPENSION N/A 12/19/2014   Procedure: TRANSVAGINAL TAPE (TVT) PROCEDURE;  Surgeon: April Coho, MD;  Location: [facility];  Service: Gynecology;  Laterality: N/A;   CARPAL TUNNEL RELEASE Right 04/2013   CATARACT EXTRACTION, BILATERAL     05/2020, 06/2020   CERVICAL CONIZATION W/BX N/A 08/28/2014   Procedure: CONIZATION CERVIX WITH BIOPSY;  Surgeon: April Hun, MD;  Location[facility] [address]  Service: Gynecology;  Laterality: N/A;   COLONOSCOPY  11/06/2009   COLONOSCOPY  08/2019   CYSTO N/A 12/19/2014   Procedure: April Cox;  Surgeon: April Coho, MD;  Location: [facility];  Service: Gynecology;  Laterality: N/A;   DENTAL SURGERY     HYSTEROSCOPY WITH D & C  04/04/2008   KNEE ARTHROPLASTY     LUMBAR PERCUTANEOUS PEDICLE SCREW 1 LEVEL  12/25/2011   Procedure: LUMBAR PERCUTANEOUS PEDICLE SCREW 1 LEVEL;  Surgeon: April Alert, MD;  Location: [facility];  Service: Neurosurgery;  Laterality: Left;  LUMBAR FOUR-FIVE   PERONEAL NERVE DECOMPRESSION Left 12/27/2009   for neuropathy with  foot drop   POLYPECTOMY     TRANSTHORACIC ECHOCARDIOGRAM  10/09/2010   mild LVH,  ef 60-65%,  grade I diastolic dysfunction,  very mild AV  stenosis (area 1.48cm/S/2(VTI) with no regurg./  mild MV calcification without stenosis or regur. /  trivial TR   VAGINAL HYSTERECTOMY Bilateral 12/19/2014   Procedure: TOTAL VAGINAL HYSTERECTOMY WITH BILATERAL SALPINGECTOMY;  Surgeon: April Hun, MD;  Location: [facility];  Service: Gynecology;  Laterality: Bilateral;   Family History  Problem Relation Age of Onset   Arthritis Mother        OA    Psoriasis Mother    Other Father        accidental death/MVA at age 79   Hypertension Brother    Other Brother  1 brother drowned   Cancer Brother    Esophageal cancer Brother    Brain cancer Brother    Hypertension Maternal Grandmother    Diabetes Maternal Grandmother    Hypertension Maternal Grandfather    Heart disease Neg Hx    Colon cancer Neg Hx    Colon polyps Neg Hx    Stomach cancer Neg Hx    Social History   Socioeconomic History   Marital status: Married    Spouse name: Not on file   Number of children: Not on file   Years of education: Not on file   Highest education level: Not on file  Occupational History   Not on file  Tobacco Use   Smoking status: Some Days    Current packs/day: 0.25    Average packs/day: 0.3 packs/day for 16.0 years (4.0 ttl pk-yrs)    Types: Cigarettes    Passive exposure: Current   Smokeless tobacco: Never   Tobacco comments:    3-4 cigs / day 08/03/19  Vaping Use   Vaping status: Never Used  Substance and Sexual Activity   Alcohol use: Not Currently    Comment: OCCASIONAL   Drug use: Never   Sexual activity: Yes  Other Topics Concern   Not on file  Social History Narrative   Not on file   Social Determinants of Health   Financial Resource Strain: Low Risk  (10/02/2022)   Overall Financial Resource Strain (CARDIA)    Difficulty of Paying Living Expenses: Not hard at all  Food Insecurity: No Food Insecurity (10/02/2022)   Hunger Vital Sign    Worried About Running Out of Food in the Last Year: Never true    Ran Out of  Food in the Last Year: Never true  Transportation Needs: No Transportation Needs (10/02/2022)   PRAPARE - Administrator, Civil Service (Medical): No    Lack of Transportation (Non-Medical): No  Physical Activity: Sufficiently Active (10/02/2022)   Exercise Vital Sign    Days of Exercise per Week: 5 days    Minutes of Exercise per Session: 30 min  Stress: No Stress Concern Present (10/02/2022)   [employer] of Occupational Health - Occupational Stress Questionnaire    Feeling of Stress : Not at all  Social Connections: Unknown (10/02/2022)   Social Connection and Isolation Panel [NHANES]    Frequency of Communication with Friends and Family: More than three times a week    Frequency of Social Gatherings with Friends and Family: Once a week    Attends Religious Services: Not on Marketing executive or Organizations: Yes    Attends Banker Meetings: More than 4 times per year    Marital Status: Married    Tobacco Counseling Ready to quit: Yes Counseling given: Not Answered Tobacco comments: 3-4 cigs / day 08/03/19   Clinical Intake:  Pre-visit preparation completed: Yes  Pain : 0-10 Pain Score: 4  Pain Type: Chronic pain Pain Location: Generalized Pain Descriptors / Indicators: Aching Pain Onset: More than a month ago Pain Frequency: Constant     Nutritional Risks: None Diabetes: No  How often do you need to have someone help you when you read instructions, pamphlets, or other written materials from your doctor or pharmacy?: 1 - Never  Interpreter Needed?: No  Information entered by :: NAllen LPN   Activities of Daily Living    10/02/2022    8:52 AM  In your present state of  health, do you have any difficulty performing the following activities:  Hearing? 0  Vision? 0  Difficulty concentrating or making decisions? 0  Walking or climbing stairs? 0  Dressing or bathing? 0  Doing errands, shopping? 0  Preparing Food and  eating ? N  Using the Toilet? N  In the past six months, have you accidently leaked urine? Y  Comment has a sling, irritable bladder  Do you have problems with loss of bowel control? N  Managing your Medications? N  Managing your Finances? N  Housekeeping or managing your Housekeeping? N    Patient Care Team: Ronnald Nian, MD as PCP - General (Family Medicine) Pa, Elmer [facility] [place], Dois Davenport, MD as Consulting Physician (Obstetrics and Gynecology) Pollyann Savoy, MD as Consulting Physician (Rheumatology)  Indicate any recent Medical Services you may have received from other than [facility] providers in the past year (date may be approximate).     Assessment:   This is a routine wellness examination for [facility].  Hearing/Vision screen Hearing Screening - Comments:: Denies hearing issues Vision Screening - Comments:: Regular eye exams, [facility]   Goals Addressed             This Visit's Progress    Patient Stated       10/06/2022, wants to stay healthy       Depression Screen    10/06/2022    8:50 AM 08/01/2021    9:40 AM 06/28/2020    9:38 AM 05/27/2020    4:11 PM 06/28/2019    8:45 AM 09/09/2016    2:16 PM 09/10/2014   11:37 AM  PHQ 2/9 Scores  PHQ - 2 Score 0 0 0 0 0 0 0  PHQ- 9 Score 0 0         Fall Risk    10/02/2022    8:52 AM 09/08/2022    9:04 AM 08/01/2021    9:39 AM 07/31/2021   10:43 AM 06/28/2020    9:37 AM  Fall Risk   Falls in the past year? 0 0 0 0 0  Number falls in past yr: 0 0 0 0 0  Injury with Fall? 0 0 0 0 0  Risk for fall due to : Medication side effect No Fall Risks Medication side effect  No Fall Risks  Follow up Falls prevention discussed;Falls evaluation completed Falls evaluation completed Falls evaluation completed;Education provided;Falls prevention discussed  Falls evaluation completed    MEDICARE RISK AT HOME: Medicare Risk at Home Any stairs in or around the home?: Yes If so, are there any without handrails?: Yes Home  free of loose throw rugs in walkways, pet beds, electrical cords, etc?: Yes Adequate lighting in your home to reduce risk of falls?: Yes Life Cox?: No Use of a cane, walker or w/c?: No Grab bars in the bathroom?: No Shower chair or bench in shower?: No Elevated toilet seat or a handicapped toilet?: Yes  TIMED UP AND GO:  Was the test performed?  No    Cognitive Function:        10/06/2022    8:51 AM 08/01/2021    9:42 AM  6CIT Screen  What Year? 0 points 0 points  What month? 0 points 0 points  What time? 0 points 0 points  Count back from 20 0 points 0 points  Months in reverse 0 points 0 points  Repeat phrase 0 points 0 points  Total Score 0 points 0 points    Immunizations Immunization  History  Administered Date(s) Administered   Covid-19, Mrna,Vaccine(Spikevax)59yrs and older 11/11/2021   Fluad Quad(high Dose 65+) 10/11/2018   Influenza Split 12/19/2010, 12/01/2011   Influenza, High Dose Seasonal PF 11/16/2020, 11/06/2021   Influenza,inj,Quad PF,6+ Mos 01/21/2016   Influenza,inj,quad, With Preservative 12/04/2017   PFIZER(Purple Top)SARS-COV-2 Vaccination 02/27/2019, 03/24/2019, 10/02/2019, 05/23/2020   Pfizer Covid-19 Vaccine Bivalent Booster 69yrs & up 10/23/2020   Pneumococcal Conjugate-13 01/20/2014   Pneumococcal Polysaccharide-23 08/01/2010   Rsv, Bivalent, Protein Subunit Rsvpref,pf Verdis Frederickson) 10/06/2021   Tdap 01/21/2016, 09/15/2017   Zoster Recombinant(Shingrix) 12/11/2017, 02/27/2018    TDAP status: Up to date  Flu Vaccine status: Due, Education has been provided regarding the importance of this vaccine. Advised may receive this vaccine at local pharmacy or Health Dept. Aware to provide a copy of the vaccination record if obtained from local pharmacy or Health Dept. Verbalized acceptance and understanding.  Pneumococcal vaccine status: Up to date  Covid-19 vaccine status: Information provided on how to obtain vaccines.   Qualifies for Shingles  Vaccine? Yes   Zostavax completed Yes   Shingrix Completed?: Yes  Screening Tests Health Maintenance  Topic Date Due   INFLUENZA VACCINE  08/20/2022   Colonoscopy  08/21/2022   COVID-19 Vaccine (7 - 2023-24 season) 09/20/2022   MAMMOGRAM  12/11/2022   Medicare Annual Wellness (AWV)  10/06/2023   DTaP/Tdap/Td (3 - Td or Tdap) 09/16/2027   DEXA SCAN  Completed   Hepatitis C Screening  Completed   Zoster Vaccines- Shingrix  Completed   HPV VACCINES  Aged Out   Pneumonia Vaccine 41+ Years old  Discontinued    Health Maintenance  Health Maintenance Due  Topic Date Due   INFLUENZA VACCINE  08/20/2022   Colonoscopy  08/21/2022   COVID-19 Vaccine (7 - 2023-24 season) 09/20/2022    Colorectal cancer screening: Type of screening: Colonoscopy. Completed 08/21/2019. Repeat every 3 years  Mammogram status: Completed 2024. Repeat every year  Bone Density status: Completed 03/25/2022.   Lung Cancer Screening: (Low Dose CT Chest recommended if Age 39-80 years, 20 pack-year currently smoking OR have quit w/in 15years.) does not qualify.   Lung Cancer Screening Referral: no  Additional Screening:  Hepatitis C Screening: does qualify; Completed 10/05/2017  Vision Screening: Recommended annual ophthalmology exams for early detection of glaucoma and other disorders of the eye. Is the patient up to date with their annual eye exam?  Yes  Who is the provider or what is the name of the office in which the patient attends annual eye exams? [facility] If pt is not established with a provider, would they like to be referred to a provider to establish care? No .   Dental Screening: Recommended annual dental exams for proper oral hygiene  Diabetic Foot Exam: n/a  Community Resource Referral / Chronic Care Management: CRR required this visit?  No   CCM required this visit?  No     Plan:     I have personally reviewed and noted the following in the patient's chart:   Medical and  social history Use of alcohol, tobacco or illicit drugs  Current medications and supplements including opioid prescriptions. Patient is not currently taking opioid prescriptions. Functional ability and status Nutritional status Physical activity Advanced directives List of other physicians Hospitalizations, surgeries, and ER visits in previous 12 months Vitals Screenings to include cognitive, depression, and falls Referrals and appointments  In addition, I have reviewed and discussed with patient certain preventive protocols, quality metrics, and best practice recommendations.  A written personalized care plan for preventive services as well as general preventive health recommendations were provided to patient.     Barb Merino, LPN   02/18/8655   After Visit Summary: (MyChart) Due to this being a telephonic visit, the after visit summary with patients personalized plan was offered to patient via MyChart   Nurse Notes: none

## 2022-10-06 NOTE — Patient Instructions (Signed)
April Cox , Thank you for taking time to come for your Medicare Wellness Visit. I appreciate your ongoing commitment to your health goals. Please review the following plan we discussed and let me know if I can assist you in the future.   Referrals/Orders/Follow-Ups/Clinician Recommendations: none  This is a list of the screening recommended for you and due dates:  Health Maintenance  Topic Date Due   Flu Shot  08/20/2022   Colon Cancer Screening  08/21/2022   COVID-19 Vaccine (7 - 2023-24 season) 09/20/2022   Mammogram  12/11/2022   Medicare Annual Wellness Visit  10/06/2023   DTaP/Tdap/Td vaccine (3 - Td or Tdap) 09/16/2027   DEXA scan (bone density measurement)  Completed   Hepatitis C Screening  Completed   Zoster (Shingles) Vaccine  Completed   HPV Vaccine  Aged Out   Pneumonia Vaccine  Discontinued    Advanced directives: (In Chart) A copy of your advanced directives are scanned into your chart should your provider ever need it.  Next Medicare Annual Wellness Visit scheduled for next year: Yes  Insert Preventive Care attachment Insert FALL PREVENTION attachment if needed

## 2022-10-13 ENCOUNTER — Ambulatory Visit (AMBULATORY_SURGERY_CENTER): Payer: Medicare Other | Admitting: *Deleted

## 2022-10-13 ENCOUNTER — Encounter: Payer: Self-pay | Admitting: Gastroenterology

## 2022-10-13 VITALS — Ht 65.0 in | Wt 112.0 lb

## 2022-10-13 DIAGNOSIS — Z86010 Personal history of colonic polyps: Secondary | ICD-10-CM

## 2022-10-13 MED ORDER — NA SULFATE-K SULFATE-MG SULF 17.5-3.13-1.6 GM/177ML PO SOLN
1.0000 | Freq: Once | ORAL | 0 refills | Status: AC
Start: 2022-10-13 — End: 2022-10-13

## 2022-10-13 NOTE — Progress Notes (Signed)
No egg or soy allergy known to patient  No issues known to pt with past sedation with any surgeries or procedures Patient denies ever being told they had issues or difficulty with intubation  No FH of Malignant Hyperthermia Pt is not on diet pills Pt is not on  home 02  Pt is not on blood thinners  Pt denies issues with constipation  No A fib or A flutter Have any cardiac testing pending--no Pt instructed to use [email or web address].[email or web address] or GoodRx for a price reduction on prep  Patient's chart reviewed by April Cox CNRA prior to previsit and patient appropriate for the [facility].  Previsit completed and red dot placed by patient's name on their procedure day (on provider's schedule).    

## 2022-10-20 HISTORY — PX: COLONOSCOPY: SHX174

## 2022-10-27 ENCOUNTER — Encounter: Payer: Self-pay | Admitting: Gastroenterology

## 2022-10-27 ENCOUNTER — Ambulatory Visit: Payer: Medicare Other | Admitting: Gastroenterology

## 2022-10-27 VITALS — BP 128/47 | HR 75 | Temp 98.0°F | Resp 19 | Ht 66.0 in | Wt 112.0 lb

## 2022-10-27 DIAGNOSIS — E039 Hypothyroidism, unspecified: Secondary | ICD-10-CM | POA: Diagnosis not present

## 2022-10-27 DIAGNOSIS — Z09 Encounter for follow-up examination after completed treatment for conditions other than malignant neoplasm: Secondary | ICD-10-CM

## 2022-10-27 DIAGNOSIS — Z860100 Personal history of colon polyps, unspecified: Secondary | ICD-10-CM | POA: Diagnosis not present

## 2022-10-27 DIAGNOSIS — I10 Essential (primary) hypertension: Secondary | ICD-10-CM | POA: Diagnosis not present

## 2022-10-27 DIAGNOSIS — D124 Benign neoplasm of descending colon: Secondary | ICD-10-CM | POA: Diagnosis not present

## 2022-10-27 DIAGNOSIS — D123 Benign neoplasm of transverse colon: Secondary | ICD-10-CM | POA: Diagnosis not present

## 2022-10-27 DIAGNOSIS — D128 Benign neoplasm of rectum: Secondary | ICD-10-CM

## 2022-10-27 DIAGNOSIS — E785 Hyperlipidemia, unspecified: Secondary | ICD-10-CM | POA: Diagnosis not present

## 2022-10-27 MED ORDER — SODIUM CHLORIDE 0.9 % IV SOLN: 500.0000 mL | Freq: Once | INTRAVENOUS | Status: DC

## 2022-10-27 NOTE — Progress Notes (Unsigned)
Office Visit Note  Patient: April Cox             Date of Birth: 1955-11-27           MRN: [id number]             PCP: Ronnald Nian, MD Referring: Ronnald Nian, MD Visit Date: 11/10/2022 Occupation: @GUAROCC @  Subjective:  Recent flares   History of Present Illness: April Cox is a 67 y.o. female with history of psoriatic arthritis and osteoarthritis.  Patient is prescribed Cosentyx 150 mg every 14 days (started September 2019), methotrexate 1 mL every 7 days, and folic acid 1 mg 2 tablets daily.  Patient states that she is about 2 weeks overdue for her injections due to planning to have her annual flu shot and COVID vaccines this week.  She has not had any interruptions in therapy otherwise.  Patient states that she has been under tremendous amount of stress recently caring for her husband who recently had a major fall and a brain bleed.  She has been having to do more around the house as well as has been under increased stress taking her husband to appointments.  She has been having intermittent flares in her hands as well as some intermittent pain and instability in the left knee.  She has some difficulty climbing steps at times as well as a buckling sensation in the left knee intermittently.  She has not noticed any swelling in the left knee.  She denies any nocturnal pain.  She has been taking Aleve as needed for pain relief.  She denies any Achilles tendinitis or plantar fasciitis.  She has chronic SI joint pain which has been unchanged.  She denies any active psoriasis at this time. Patient is due for her next IV Reclast infusion in December 2024.  She continues to take a calcium and vitamin D supplement.  She is due for an updated bone density in April 2025.    Activities of Daily Living:  Patient reports morning stiffness for 30 minutes.   Patient Reports nocturnal pain.  Difficulty dressing/grooming: Denies Difficulty climbing stairs: Reports Difficulty getting out  of chair: Denies Difficulty using hands for taps, buttons, cutlery, and/or writing: Reports  Review of Systems  Constitutional:  Positive for fatigue.  HENT:  Positive for mouth dryness. Negative for mouth sores.   Eyes:  Positive for dryness.  Respiratory:  Negative for shortness of breath.   Cardiovascular:  Negative for chest pain and palpitations.  Gastrointestinal:  Negative for blood in stool, constipation and diarrhea.  Endocrine: Negative for increased urination.  Genitourinary:  Negative for involuntary urination.  Musculoskeletal:  Positive for joint pain, joint pain, joint swelling and morning stiffness. Negative for gait problem, myalgias, muscle weakness, muscle tenderness and myalgias.  Skin:  Negative for color change, rash, hair loss and sensitivity to sunlight.  Allergic/Immunologic: Negative for susceptible to infections.  Neurological:  Negative for dizziness and headaches.  Hematological:  Negative for swollen glands.  Psychiatric/Behavioral:  Negative for depressed mood and sleep disturbance. The patient is not nervous/anxious.     PMFS History:  Patient Active Problem List   Diagnosis Date Noted   Nonrheumatic aortic valve stenosis 08/12/2021   Sicca complex (HCC) 04/01/2017   DDD (degenerative disc disease), lumbar 04/01/2017   Psoriasis 04/01/2017   Dry eye 01/01/2017   Spondylosis of lumbar region without myelopathy or radiculopathy 01/07/2016   High risk medication use 01/07/2016   Essential hypertension 09/10/2014  History of HPV infection 09/10/2014   History of colonic polyps 09/10/2014   Osteoporosis 09/10/2014   Gastroesophageal reflux disease without esophagitis 09/10/2014   Cigarette smoker 05/11/2011   Hyperlipidemia 08/22/2010   Hypothyroidism 08/22/2010   Psoriatic arthritis (HCC) 08/22/2010    Past Medical History:  Diagnosis Date   Abnormal Pap smear of cervix    Allergy    seasonal   Anemia    history of anemia   Arthritis     Cataract    bilateral,removed   Diverticulosis of colon    Family history of adverse reaction to anesthesia    mother PONV   GERD (gastroesophageal reflux disease)    Heart murmur    asymptomatic per pt   History of adenomatous polyp of colon    11-06-2009  tubullar   HPV in female    Hyperlipidemia    Hypertension    Hypothyroidism    Irritable bladder    per patient   Mild aortic valve stenosis    per echo 10-09-2010  ef 60-65%  valva area 1.48cm   Osteoporosis    Psoriasis    Psoriatic arthritis (HCC)    Dr. Corliss Skains   Wears glasses     Family History  Problem Relation Age of Onset   Colon polyps Mother    Arthritis Mother        OA    Psoriasis Mother    Other Father        accidental death/MVA at age 73   Hypertension Brother    Other Brother        1 brother drowned   Cancer Brother    Esophageal cancer Brother    Brain cancer Brother    Hypertension Maternal Grandmother    Diabetes Maternal Grandmother    Hypertension Maternal Grandfather    Heart disease Neg Hx    Colon cancer Neg Hx    Stomach cancer Neg Hx    Crohn's disease Neg Hx    Rectal cancer Neg Hx    Ulcerative colitis Neg Hx    Past Surgical History:  Procedure Laterality Date   ABDOMINAL HYSTERECTOMY     ANTERIOR LAT LUMBAR FUSION  12/25/2011   Procedure: ANTERIOR LATERAL LUMBAR FUSION 1 LEVEL;  Surgeon: Tia Alert, MD;  Location: [facility];  Service: Neurosurgery;  Laterality: Left;  LUMBAR FOUR-FIVE   BLADDER SUSPENSION N/A 12/19/2014   Procedure: TRANSVAGINAL TAPE (TVT) PROCEDURE;  Surgeon: Osborn Coho, MD;  Location: [facility];  Service: Gynecology;  Laterality: N/A;   CARPAL TUNNEL RELEASE Right 04/2013   CATARACT EXTRACTION, BILATERAL     05/2020, 06/2020   CERVICAL CONIZATION W/BX N/A 08/28/2014   Procedure: CONIZATION CERVIX WITH BIOPSY;  Surgeon: Kirkland Hun, MD;  Location: [facility];  Service: Gynecology;  Laterality: N/A;   COLONOSCOPY  11/06/2009    COLONOSCOPY  08/2019   COLONOSCOPY  10/2022   CYSTO N/A 12/19/2014   Procedure: CYSTO;  Surgeon: Osborn Coho, MD;  Location: [facility];  Service: Gynecology;  Laterality: N/A;   DENTAL SURGERY     HYSTEROSCOPY WITH D & C  04/04/2008   KNEE ARTHROPLASTY     LUMBAR PERCUTANEOUS PEDICLE SCREW 1 LEVEL  12/25/2011   Procedure: LUMBAR PERCUTANEOUS PEDICLE SCREW 1 LEVEL;  Surgeon: Tia Alert, MD;  Location: [facility];  Service: Neurosurgery;  Laterality: Left;  LUMBAR FOUR-FIVE   PERONEAL NERVE DECOMPRESSION Left 12/27/2009   for neuropathy with  foot drop  POLYPECTOMY     TRANSTHORACIC ECHOCARDIOGRAM  10/09/2010   mild LVH,  ef 60-65%,  grade I diastolic dysfunction,  very mild AV stenosis (area 1.48cm/S/2(VTI) with no regurg./  mild MV calcification without stenosis or regur. /  trivial TR   VAGINAL HYSTERECTOMY Bilateral 12/19/2014   Procedure: TOTAL VAGINAL HYSTERECTOMY WITH BILATERAL SALPINGECTOMY;  Surgeon: Kirkland Hun, MD;  Location: [facility];  Service: Gynecology;  Laterality: Bilateral;   Social History   Social History Narrative   Not on file   Immunization History  Administered Date(s) Administered   Fluad Quad(high Dose 65+) 10/11/2018   Influenza Split 12/19/2010, 12/01/2011   Influenza, High Dose Seasonal PF 11/16/2020, 11/06/2021   Influenza,inj,Quad PF,6+ Mos 01/21/2016   Influenza,inj,quad, With Preservative 12/04/2017   Moderna Covid-19 Fall Seasonal Vaccine 46yrs & older 11/11/2021   PFIZER(Purple Top)SARS-COV-2 Vaccination 02/27/2019, 03/24/2019, 10/02/2019, 05/23/2020   Pfizer Covid-19 Vaccine Bivalent Booster 55yrs & up 10/23/2020   Pneumococcal Conjugate-13 01/20/2014   Pneumococcal Polysaccharide-23 08/01/2010   Rsv, Bivalent, Protein Subunit Rsvpref,pf Verdis Frederickson) 10/06/2021   Tdap 01/21/2016, 09/15/2017   Zoster Recombinant(Shingrix) 12/11/2017, 02/27/2018     Objective: Vital Signs: BP (!) 165/84 (BP Location: Right Arm, Patient Position: Sitting,  Cuff Size: Normal)   Pulse 61   Resp 16   Ht 5\' 5"  (1.651 m)   Wt 114 lb 9.6 oz (52 kg)   BMI 19.07 kg/m    Physical Exam Vitals and nursing note reviewed.  Constitutional:      Appearance: She is well-developed.  HENT:     Head: Normocephalic and atraumatic.  Eyes:     Conjunctiva/sclera: Conjunctivae normal.  Cardiovascular:     Rate and Rhythm: Normal rate and regular rhythm.     Heart sounds: Normal heart sounds.  Pulmonary:     Effort: Pulmonary effort is normal.     Breath sounds: Normal breath sounds.  Abdominal:     General: Bowel sounds are normal.     Palpations: Abdomen is soft.  Musculoskeletal:     Cervical back: Normal range of motion.  Lymphadenopathy:     Cervical: No cervical adenopathy.  Skin:    General: Skin is warm and dry.     Capillary Refill: Capillary refill takes less than 2 seconds.  Neurological:     Mental Status: She is alert and oriented to person, place, and time.  Psychiatric:        Behavior: Behavior normal.      Musculoskeletal Exam: C-spine has limited range of motion with lateral rotation.  No midline spinal tenderness.  Mild SI joint tenderness bilaterally.  Shoulder joints have good range of motion with mild tenderness upon palpation of the right shoulder.  Elbow joints have good range of motion with no tenderness or inflammation.  Severe PIP and DIP thickening.  Tenderness over the right 1st through 3rd MCP and PIP joints.  Hip joints have good range of motion with no groin pain.  Knee joints have good range of motion with no warmth or effusion.  No tenderness along the joint line.  Ankle joints have good range of motion with no tenderness or joint swelling.  No evidence of Achilles tendinitis.  CDAI Exam: CDAI Score: -- Patient Global: --; Provider Global: -- Swollen: --; Tender: -- Joint Exam 11/10/2022   No joint exam has been documented for this visit   There is currently no information documented on the homunculus. Go to  the Rheumatology activity and complete the homunculus joint exam.  Investigation:  No additional findings.  Imaging: No results found.  Recent Labs: Lab Results  Component Value Date   WBC 6.6 09/08/2022   HGB 12.5 09/08/2022   PLT 293 09/08/2022   NA 139 09/08/2022   K 4.7 09/08/2022   CL 103 09/08/2022   CO2 24 09/08/2022   GLUCOSE 85 09/08/2022   BUN 3 (L) 09/08/2022   CREATININE 0.63 09/08/2022   BILITOT 0.5 09/08/2022   ALKPHOS 89 09/08/2022   AST 19 09/08/2022   ALT 16 09/08/2022   PROT 6.6 09/08/2022   ALBUMIN 4.3 09/08/2022   CALCIUM 9.8 09/08/2022   GFRAA 113 04/02/2020   QFTBGOLD Negative 08/02/2015   QFTBGOLDPLUS NEGATIVE 06/10/2022    Speciality Comments: Prior therapy: Remicade and Humira (inadequate response) Osteoporosis managed by PCP. Last DXA August 2019.  Procedures:  No procedures performed Allergies: Bee venom, Codeine, Other, Penicillins, and Gluten meal     Assessment / Plan:     Visit Diagnoses: Psoriatic arthritis (HCC): Patient has been under a tremendous amount of stress for the past 6 weeks leading to an increased frequency of flares.  The flares have primarily been involving her hands.  On examination today she has tenderness over the right 1st through 3rd MCP and PIP joints.  She has also had intermittent discomfort and instability in the left knee.  No warmth or effusion noted in the left knee today.  She is about 2 weeks overdue for Cosentyx and methotrexate due to planning to receive the annual flu shot and COVID vaccine last week.  She had to postpone the vaccines last week due to her husband having to get a pacemaker urgently.  She is planning on getting her flu shot tomorrow and the COVID-vaccine at the end of the week.  She has no evidence of Achilles tendinitis or plantar fasciitis.  No signs or symptoms of uveitis.  No active psoriasis at this time. She will remain on Cosentyx 150 mg sq injections every 14 days, methotrexate 1 mL sq  injections once weekly, and folic acid 2 mg daily.  She was advised to notify us if she continues to have recurrent flares.  No medication changes will be made at this time.  She will follow-up in the office in 5 months or sooner if needed.  Psoriasis: No active psoriasis at this time.   High risk medication use - Cosentyx 150 mg every 14 days (started September 2019), methotrexate 1 mL every 7 days, and folic acid 1 mg 2 tablets daily.  Inadequate response to remicade. No recent or recurrent infections.  Discussed the importance of holding cosentyx and methotrexate if she develops signs or symptoms of an infection and to resume once the infection has completley cleared.  CBC and CMP updated on 09/08/22.  Her next lab work will be due in November and every 3 months.  Standing orders for CBC and CMP remain in place. TB gold negative on 06/10/22.   Sicca complex (HCC): Chronic, unchanged.  She has been using refresh eyedrops and Xiidra eyedrops for dry eyes.  Primary osteoarthritis of both hands: Clinical findings are consistent with psoriatic arthritis and osteoarthritis overlap with thickening and subluxation of several PIP and DIP joints.  She has been experiencing more frequent flares over the past 6 weeks which she attributes to being under increased rest as well as having to do more around the house caring for her husband.  Degeneration of intervertebral disc of lumbar region without discogenic back pain or lower extremity pain:  No midline spinal tenderness on examination today.   Age-related osteoporosis without current pathological fracture - DEXA scan on April 24, 2021  T score was -3.8 in the left radius.  She completed Tymlos prescribed by Dr. Susann Givens. First infusion of IV Reclast in Dec 2023. Due for second IV Reclast infusion in December 2024.  She continues to take a calcium and vitamin D supplement daily. Due to update DEXA in April 2025.  Trochanteric bursitis of right hip:  Intermittent discomfort.   Other medical conditions are listed as follows:   History of gastroesophageal reflux (GERD)  History of hypertension: Blood pressure was elevated today in the office and was rechecked prior to leaving.  Patient has not yet taken her antihypertensive medication today.   History of hypothyroidism  History of colonic polyps  Smoker  Orders: No orders of the defined types were placed in this encounter.  No orders of the defined types were placed in this encounter.   Follow-Up Instructions: Return in about 5 months (around 04/10/2023) for Psoriatic arthritis, Osteoarthritis.   Gearldine Bienenstock, PA-C  Note - This record has been created using Dragon software.  Chart creation errors have been sought, but may not always  have been located. Such creation errors do not reflect on  the standard of medical care.

## 2022-10-27 NOTE — Progress Notes (Signed)
[facility] History and Physical   Primary Care Physician:  Ronnald Nian, MD   Reason for Procedure:   History of colon polyps  Plan:    colonoscopy     HPI: April Cox is a 68 y.o. female  here for colonoscopy surveillance - advanced adenoma removed 8/21.   Patient denies any bowel symptoms at this time. No family history of colon cancer known. Otherwise feels well without any cardiopulmonary symptoms.   I have discussed risks / benefits of anesthesia and endoscopic procedure with April Cox and they wish to proceed with the exams as outlined today.    Past Medical History:  Diagnosis Date   Abnormal Pap smear of cervix    Allergy    seasonal   Anemia    history of anemia   Arthritis    Cataract    bilateral,removed   Diverticulosis of colon    Family history of adverse reaction to anesthesia    mother PONV   GERD (gastroesophageal reflux disease)    Heart murmur    asymptomatic per pt   History of adenomatous polyp of colon    11-06-2009  tubullar   HPV in female    Hyperlipidemia    Hypertension    Hypothyroidism    Irritable bladder    per patient   Mild aortic valve stenosis    per echo 10-09-2010  ef 60-65%  valva area 1.48cm   Osteoporosis    Psoriasis    Psoriatic arthritis (HCC)    Dr. Corliss Skains   Wears glasses     Past Surgical History:  Procedure Laterality Date   ABDOMINAL HYSTERECTOMY     ANTERIOR LAT LUMBAR FUSION  12/25/2011   Procedure: ANTERIOR LATERAL LUMBAR FUSION 1 LEVEL;  Surgeon: Tia Alert, MD;  Location: [facility];  Service: Neurosurgery;  Laterality: Left;  LUMBAR FOUR-FIVE   BLADDER SUSPENSION N/A 12/19/2014   Procedure: TRANSVAGINAL TAPE (TVT) PROCEDURE;  Surgeon: Osborn Coho, MD;  Location: [facility];  Service: Gynecology;  Laterality: N/A;   CARPAL TUNNEL RELEASE Right 04/2013   CATARACT EXTRACTION, BILATERAL     05/2020, 06/2020   CERVICAL CONIZATION W/BX N/A 08/28/2014   Procedure: CONIZATION  CERVIX WITH BIOPSY;  Surgeon: Kirkland Hun, MD;  Location: [facility];  Service: Gynecology;  Laterality: N/A;   COLONOSCOPY  11/06/2009   COLONOSCOPY  08/2019   CYSTO N/A 12/19/2014   Procedure: Clearnce Sorrel;  Surgeon: Osborn Coho, MD;  Location: [facility];  Service: Gynecology;  Laterality: N/A;   DENTAL SURGERY     HYSTEROSCOPY WITH D & C  04/04/2008   KNEE ARTHROPLASTY     LUMBAR PERCUTANEOUS PEDICLE SCREW 1 LEVEL  12/25/2011   Procedure: LUMBAR PERCUTANEOUS PEDICLE SCREW 1 LEVEL;  Surgeon: Tia Alert, MD;  Location: [facility];  Service: Neurosurgery;  Laterality: Left;  LUMBAR FOUR-FIVE   PERONEAL NERVE DECOMPRESSION Left 12/27/2009   for neuropathy with  foot drop   POLYPECTOMY     TRANSTHORACIC ECHOCARDIOGRAM  10/09/2010   mild LVH,  ef 60-65%,  grade I diastolic dysfunction,  very mild AV stenosis (area 1.48cm/S/2(VTI) with no regurg./  mild MV calcification without stenosis or regur. /  trivial TR   VAGINAL HYSTERECTOMY Bilateral 12/19/2014   Procedure: TOTAL VAGINAL HYSTERECTOMY WITH BILATERAL SALPINGECTOMY;  Surgeon: Kirkland Hun, MD;  Location: [facility];  Service: Gynecology;  Laterality: Bilateral;    Prior to Admission medications   Medication Sig Start Date End  Date Taking? Authorizing Provider  atorvastatin (LIPITOR) 40 MG tablet Take 1 tablet (40 mg total) by mouth daily. 09/08/22  Yes Ronnald Nian, MD  Cholecalciferol (VITAMIN D PO) Take 500 mg by mouth daily.    Yes [provider]  CRANBERRY PO Take by mouth daily.   Yes [provider]  Estradiol 10 MCG TABS vaginal tablet Place vaginally. 05/19/22  Yes [provider]  folic acid (FOLVITE) 1 MG tablet TAKE TWO TABLETS BY MOUTH DAILY 11/17/21  Yes Gearldine Bienenstock, PA-C  levothyroxine (SYNTHROID) 25 MCG tablet TAKE 1 TABLET BY MOUTH DAILY BEFORE BREAKFAST 09/08/22  Yes Ronnald Nian, MD  methotrexate 50 MG/2ML injection INJECT 1 MILLILITER (CC) UNDER THE SKIN ONCE WEEKLY  08/16/22  Yes Deveshwar, Janalyn Rouse, MD  metoprolol succinate (TOPROL-XL) 50 MG 24 hr tablet TAKE 1 TABLET BY MOUTH DAILY IMMEDIATELY FOLLOWING A MEAL 09/08/22  Yes Ronnald Nian, MD  Multiple Vitamins-Minerals (PRESERVISION AREDS PO) Take by mouth daily. Take one daily   Yes [provider]  pantoprazole (PROTONIX) 40 MG tablet Take 1 tablet (40 mg total) by mouth daily. 09/08/22  Yes Ronnald Nian, MD  Polyvinyl Alcohol-Povidone (REFRESH OP) Apply to eye as needed.   Yes [provider]  vitamin C (ASCORBIC ACID) 250 MG tablet Take 250 mg by mouth daily.   Yes [provider]  XIIDRA 5 % SOLN 2 (two) times daily. 03/22/17  Yes [provider]  calcium-vitamin D (OSCAL WITH D) 500-200 MG-UNIT per tablet Take 1 tablet by mouth daily.    [provider]  clobetasol ointment (TEMOVATE) 0.05 % Apply 1 application  topically as needed (psiorisis). 09/11/14   [provider]  EPINEPHrine 0.3 mg/0.3 mL IJ SOAJ injection Inject 0.3 mg into the muscle as needed for anaphylaxis. Patient not taking: Reported on 10/13/2022 07/03/22   Garlon Hatchet, PA-C  MONOJECT 1CC TB SYR 27GX1/2" 27G X 1/2" 1 ML MISC USE AS DIRECTED 04/13/22   Gearldine Bienenstock, PA-C  Secukinumab, 300 MG Dose, (COSENTYX SENSOREADY, 300 MG,) 150 MG/ML SOAJ INJECT 1 PEN UNDER THE SKIN (SUBCUTANEOUS INJECTION) EVERY 14 DAYS 09/29/19   Gearldine Bienenstock, PA-C  Zoledronic Acid (RECLAST IV) Inject into the vein. First IV: 12/2021    [provider]    Current Outpatient Medications  Medication Sig Dispense Refill   atorvastatin (LIPITOR) 40 MG tablet Take 1 tablet (40 mg total) by mouth daily. 90 tablet 3   Cholecalciferol (VITAMIN D PO) Take 500 mg by mouth daily.      CRANBERRY PO Take by mouth daily.     Estradiol 10 MCG TABS vaginal tablet Place vaginally.     folic acid (FOLVITE) 1 MG tablet TAKE TWO TABLETS BY MOUTH DAILY 180 tablet 3   levothyroxine (SYNTHROID) 25 MCG tablet TAKE 1  TABLET BY MOUTH DAILY BEFORE BREAKFAST 90 tablet 3   methotrexate 50 MG/2ML injection INJECT 1 MILLILITER (CC) UNDER THE SKIN ONCE WEEKLY 12 mL 0   metoprolol succinate (TOPROL-XL) 50 MG 24 hr tablet TAKE 1 TABLET BY MOUTH DAILY IMMEDIATELY FOLLOWING A MEAL 90 tablet 3   Multiple Vitamins-Minerals (PRESERVISION AREDS PO) Take by mouth daily. Take one daily     pantoprazole (PROTONIX) 40 MG tablet Take 1 tablet (40 mg total) by mouth daily. 90 tablet 3   Polyvinyl Alcohol-Povidone (REFRESH OP) Apply to eye as needed.     vitamin C (ASCORBIC ACID) 250 MG tablet Take 250 mg by  mouth daily.     XIIDRA 5 % SOLN 2 (two) times daily.  3   calcium-vitamin D (OSCAL WITH D) 500-200 MG-UNIT per tablet Take 1 tablet by mouth daily.     clobetasol ointment (TEMOVATE) 0.05 % Apply 1 application  topically as needed (psiorisis).  0   EPINEPHrine 0.3 mg/0.3 mL IJ SOAJ injection Inject 0.3 mg into the muscle as needed for anaphylaxis. (Patient not taking: Reported on 10/13/2022) 1 each 0   MONOJECT 1CC TB SYR 27GX1/2" 27G X 1/2" 1 ML MISC USE AS DIRECTED 12 each 3   Secukinumab, 300 MG Dose, (COSENTYX SENSOREADY, 300 MG,) 150 MG/ML SOAJ INJECT 1 PEN UNDER THE SKIN (SUBCUTANEOUS INJECTION) EVERY 14 DAYS 6 mL 0   Zoledronic Acid (RECLAST IV) Inject into the vein. First IV: 12/2021     Current Facility-Administered Medications  Medication Dose Route Frequency Provider Last Rate Last Admin   0.9 %  sodium chloride infusion  500 mL Intravenous Once Rainna Nearhood, Willaim Rayas, MD        Allergies as of 10/27/2022 - Review Complete 10/27/2022  Allergen Reaction Noted   Bee venom Anaphylaxis 07/02/2022   Codeine Hives and Nausea And Vomiting 10/10/2009   Other Anaphylaxis 12/15/2011   Penicillins Shortness Of Breath, Swelling, and Rash 10/10/2009   Gluten meal Swelling and Other (See Comments) 12/15/2011    Family History  Problem Relation Age of Onset   Colon polyps Mother    Arthritis Mother        OA     Psoriasis Mother    Other Father        accidental death/MVA at age 62   Hypertension Brother    Other Brother        1 brother drowned   Cancer Brother    Esophageal cancer Brother    Brain cancer Brother    Hypertension Maternal Grandmother    Diabetes Maternal Grandmother    Hypertension Maternal Grandfather    Heart disease Neg Hx    Colon cancer Neg Hx    Stomach cancer Neg Hx    Crohn's disease Neg Hx    Rectal cancer Neg Hx    Ulcerative colitis Neg Hx     Social History   Socioeconomic History   Marital status: Married    Spouse name: Not on file   Number of children: Not on file   Years of education: Not on file   Highest education level: Not on file  Occupational History   Not on file  Tobacco Use   Smoking status: Some Days    Current packs/day: 0.25    Average packs/day: 0.3 packs/day for 16.0 years (4.0 ttl pk-yrs)    Types: Cigarettes    Passive exposure: Current   Smokeless tobacco: Never   Tobacco comments:    3-4 cigs / day 08/03/19  Vaping Use   Vaping status: Never Used  Substance and Sexual Activity   Alcohol use: Not Currently    Comment: OCCASIONAL   Drug use: Never   Sexual activity: Yes  Other Topics Concern   Not on file  Social History Narrative   Not on file   Social Determinants of Health   Financial Resource Strain: Low Risk  (10/02/2022)   Overall Financial Resource Strain (CARDIA)    Difficulty of Paying Living Expenses: Not hard at all  Food Insecurity: No Food Insecurity (10/02/2022)   Hunger Vital Sign    Worried About Running Out of Food in the  Last Year: Never true    Ran Out of Food in the Last Year: Never true  Transportation Needs: No Transportation Needs (10/02/2022)   PRAPARE - Administrator, Civil Service (Medical): No    Lack of Transportation (Non-Medical): No  Physical Activity: Sufficiently Active (10/02/2022)   Exercise Vital Sign    Days of Exercise per Week: 5 days    Minutes of Exercise per  Session: 30 min  Stress: No Stress Concern Present (10/02/2022)   [employer] of Occupational Health - Occupational Stress Questionnaire    Feeling of Stress : Not at all  Social Connections: Unknown (10/02/2022)   Social Connection and Isolation Panel [NHANES]    Frequency of Communication with Friends and Family: More than three times a week    Frequency of Social Gatherings with Friends and Family: Once a week    Attends Religious Services: Not on Insurance claims handler of Clubs or Organizations: Yes    Attends Banker Meetings: More than 4 times per year    Marital Status: Married  Catering manager Violence: Not At Risk (10/06/2022)   Humiliation, Afraid, Rape, and Kick questionnaire    Fear of Current or Ex-Partner: No    Emotionally Abused: No    Physically Abused: No    Sexually Abused: No    Review of Systems: All other review of systems negative except as mentioned in the HPI.  Physical Exam: Vital signs BP (!) 147/88   Pulse 87   Temp 98 F (36.7 C)   Ht 5\' 6"  (1.676 m)   Wt 112 lb (50.8 kg)   SpO2 100%   BMI 18.08 kg/m   General:   Alert,  Well-developed, pleasant and cooperative in NAD Lungs:  Clear throughout to auscultation.   Heart:  Regular rate and rhythm Abdomen:  Soft, nontender and nondistended.   Neuro/Psych:  Alert and cooperative. Normal mood and affect. A and O x 3  Harlin Rain, MD [facility]

## 2022-10-27 NOTE — Op Note (Signed)
[facility] Patient Name: April Cox Procedure Date: 10/27/2022 9:16 AM MRN: [id number] Endoscopist: Viviann Spare P. Adela Lank , MD, [id number] Age: 67 Referring MD:  Date of Birth: 1955-07-26 Gender: Female Account #: [id number] Procedure:                Colonoscopy Indications:              High risk colon cancer surveillance: Personal                            history of colonic polyps - advanced adenoma                            removed 08/2019 Medicines:                Monitored Anesthesia Care Procedure:                Pre-Anesthesia Assessment:                           - Prior to the procedure, a History and Physical                            was performed, and patient medications and                            allergies were reviewed. The patient's tolerance of                            previous anesthesia was also reviewed. The risks                            and benefits of the procedure and the sedation                            options and risks were discussed with the patient.                            All questions were answered, and informed consent                            was obtained. Prior Anticoagulants: The patient has                            taken no anticoagulant or antiplatelet agents. ASA                            Grade Assessment: II - A patient with mild systemic                            disease. After reviewing the risks and benefits,                            the patient was deemed in satisfactory condition to  undergo the procedure.                           After obtaining informed consent, the colonoscope                            was passed under direct vision. Throughout the                            procedure, the patient's blood pressure, pulse, and                            oxygen saturations were monitored continuously. The                            PCF-H190TL Slim SN [id number] was introduced  through                            the anus and advanced to the the cecum, identified                            by appendiceal orifice and ileocecal valve. The                            colonoscopy was performed without difficulty. The                            patient tolerated the procedure well. The quality                            of the bowel preparation was good. The ileocecal                            valve, appendiceal orifice, and rectum were                            photographed. Scope In: 9:20:46 AM Scope Out: 9:38:51 AM Scope Withdrawal Time: 0 hours 14 minutes 46 seconds  Total Procedure Duration: 0 hours 18 minutes 5 seconds  Findings:                 The perianal and digital rectal examinations were                            normal.                           A 3 mm polyp was found in the transverse colon. The                            polyp was flat. The polyp was removed with a cold                            snare. Resection and retrieval were complete.  A 3 mm polyp was found in the descending colon. The                            polyp was sessile. The polyp was removed with a                            cold snare. Resection and retrieval were complete.                           A 3 mm polyp was found in the rectum. The polyp was                            sessile. The polyp was removed with a cold snare.                            Resection and retrieval were complete.                           A few small-mouthed diverticula were found in the                            distal transverse colon and left colon.                           Internal hemorrhoids were found during retroflexion.                           The exam was otherwise without abnormality. Complications:            No immediate complications. Estimated blood loss:                            Minimal. Estimated Blood Loss:     Estimated blood loss was  minimal. Impression:               - One 3 mm polyp in the transverse colon, removed                            with a cold snare. Resected and retrieved.                           - One 3 mm polyp in the descending colon, removed                            with a cold snare. Resected and retrieved.                           - One 3 mm polyp in the rectum, removed with a cold                            snare. Resected and retrieved.                           -  Diverticulosis in the distal transverse colon and                            in the left colon.                           - Internal hemorrhoids.                           - The examination was otherwise normal. Recommendation:           - Patient has a contact number available for                            emergencies. The signs and symptoms of potential                            delayed complications were discussed with the                            patient. Return to normal activities tomorrow.                            Written discharge instructions were provided to the                            patient.                           - Resume previous diet.                           - Continue present medications.                           - Await pathology results.                           - Anticipate repeat colonoscopy in 5 years given                            advanced polyp removed on the last exam. Viviann Spare P. Lanesha Azzaro, MD 10/27/2022 9:42:55 AM This report has been signed electronically.

## 2022-10-27 NOTE — Progress Notes (Signed)
Pt's states no medical or surgical changes since previsit or office visit. 

## 2022-10-27 NOTE — Progress Notes (Signed)
Called to room to assist during endoscopic procedure.  Patient ID and intended procedure confirmed with present staff. Received instructions for my participation in the procedure from the performing physician.  

## 2022-10-27 NOTE — Progress Notes (Signed)
Sedate, gd SR, tolerated procedure well, VSS, report to RN 

## 2022-10-27 NOTE — Patient Instructions (Signed)
Please read handouts provided. Continue present medications. Await pathology results.   YOU HAD AN ENDOSCOPIC PROCEDURE TODAY AT THE [facility]:   Refer to the procedure report that was given to you for any specific questions about what was found during the examination.  If the procedure report does not answer your questions, please call your gastroenterologist to clarify.  If you requested that your care partner not be given the details of your procedure findings, then the procedure report has been included in a sealed envelope for you to review at your convenience later.  YOU SHOULD EXPECT: Some feelings of bloating in the abdomen. Passage of more gas than usual.  Walking can help get rid of the air that was put into your GI tract during the procedure and reduce the bloating. If you had a lower endoscopy (such as a colonoscopy or flexible sigmoidoscopy) you may notice spotting of blood in your stool or on the toilet paper. If you underwent a bowel prep for your procedure, you may not have a normal bowel movement for a few days.  Please Note:  You might notice some irritation and congestion in your nose or some drainage.  This is from the oxygen used during your procedure.  There is no need for concern and it should clear up in a day or so.  SYMPTOMS TO REPORT IMMEDIATELY:  Following lower endoscopy (colonoscopy or flexible sigmoidoscopy):  Excessive amounts of blood in the stool  Significant tenderness or worsening of abdominal pains  Swelling of the abdomen that is new, acute  Fever of 100F or higher  For urgent or emergent issues, a gastroenterologist can be reached at any hour by calling [phone]. Do not use MyChart messaging for urgent concerns.    DIET:  We do recommend a small meal at first, but then you may proceed to your regular diet.  Drink plenty of fluids but you should avoid alcoholic beverages for 24 hours.  ACTIVITY:  You should plan to take it easy for  the rest of today and you should NOT DRIVE or use heavy machinery until tomorrow (because of the sedation medicines used during the test).    FOLLOW UP: Our staff will call the number listed on your records the next business day following your procedure.  We will call around 7:15- 8:00 am to check on you and address any questions or concerns that you may have regarding the information given to you following your procedure. If we do not reach you, we will leave a message.     If any biopsies were taken you will be contacted by phone or by letter within the next 1-3 weeks.  Please call us at [phone] if you have not heard about the biopsies in 3 weeks.    SIGNATURES/CONFIDENTIALITY: You and/or your care partner have signed paperwork which will be entered into your electronic medical record.  These signatures attest to the fact that that the information above on your After Visit Summary has been reviewed and is understood.  Full responsibility of the confidentiality of this discharge information lies with you and/or your care-partner. 

## 2022-10-28 ENCOUNTER — Telehealth: Payer: Self-pay

## 2022-10-28 NOTE — Telephone Encounter (Signed)
  Follow up Call-     10/27/2022    8:54 AM  Call back number  Post procedure Call Back phone  # [phone]  Permission to leave phone message Yes     Patient questions:  Do you have a fever, pain , or abdominal swelling? No. Pain Score  0 *  Have you tolerated food without any problems? Yes.    Have you been able to return to your normal activities? Yes.    Do you have any questions about your discharge instructions: Diet   No. Medications  No. Follow up visit  No.  Do you have questions or concerns about your Care? No.  Actions: * If pain score is 4 or above: No action needed, pain <4.

## 2022-10-29 LAB — SURGICAL PATHOLOGY

## 2022-11-04 ENCOUNTER — Encounter: Payer: Self-pay | Admitting: Gastroenterology

## 2022-11-10 ENCOUNTER — Ambulatory Visit: Payer: Medicare Other | Attending: Physician Assistant | Admitting: Physician Assistant

## 2022-11-10 ENCOUNTER — Other Ambulatory Visit: Payer: Self-pay | Admitting: Physician Assistant

## 2022-11-10 ENCOUNTER — Encounter: Payer: Self-pay | Admitting: Physician Assistant

## 2022-11-10 VITALS — BP 126/87 | HR 60 | Resp 16 | Ht 65.0 in | Wt 114.6 lb

## 2022-11-10 DIAGNOSIS — L409 Psoriasis, unspecified: Secondary | ICD-10-CM | POA: Diagnosis not present

## 2022-11-10 DIAGNOSIS — Z79899 Other long term (current) drug therapy: Secondary | ICD-10-CM | POA: Diagnosis not present

## 2022-11-10 DIAGNOSIS — Z860100 Personal history of colon polyps, unspecified: Secondary | ICD-10-CM

## 2022-11-10 DIAGNOSIS — Z8639 Personal history of other endocrine, nutritional and metabolic disease: Secondary | ICD-10-CM

## 2022-11-10 DIAGNOSIS — L4050 Arthropathic psoriasis, unspecified: Secondary | ICD-10-CM | POA: Diagnosis not present

## 2022-11-10 DIAGNOSIS — Z8679 Personal history of other diseases of the circulatory system: Secondary | ICD-10-CM

## 2022-11-10 DIAGNOSIS — M51369 Other intervertebral disc degeneration, lumbar region without mention of lumbar back pain or lower extremity pain: Secondary | ICD-10-CM | POA: Diagnosis not present

## 2022-11-10 DIAGNOSIS — M7061 Trochanteric bursitis, right hip: Secondary | ICD-10-CM | POA: Diagnosis not present

## 2022-11-10 DIAGNOSIS — M3500 Sicca syndrome, unspecified: Secondary | ICD-10-CM

## 2022-11-10 DIAGNOSIS — Z8719 Personal history of other diseases of the digestive system: Secondary | ICD-10-CM

## 2022-11-10 DIAGNOSIS — M19041 Primary osteoarthritis, right hand: Secondary | ICD-10-CM

## 2022-11-10 DIAGNOSIS — M810 Age-related osteoporosis without current pathological fracture: Secondary | ICD-10-CM

## 2022-11-10 DIAGNOSIS — F17200 Nicotine dependence, unspecified, uncomplicated: Secondary | ICD-10-CM

## 2022-11-10 DIAGNOSIS — M19042 Primary osteoarthritis, left hand: Secondary | ICD-10-CM

## 2022-11-10 NOTE — Telephone Encounter (Signed)
Last Fill: 11/17/2021  Next Visit: 11/09/2022  Last Visit: 06/10/2022  Dx: Psoriatic arthritis   Current Dose per office note on 06/10/2022: folic acid 1 mg 2 tablets daily.   Okay to refill Folic Acid?

## 2022-11-10 NOTE — Patient Instructions (Signed)

## 2022-12-10 DIAGNOSIS — E559 Vitamin D deficiency, unspecified: Secondary | ICD-10-CM | POA: Diagnosis not present

## 2022-12-10 DIAGNOSIS — M810 Age-related osteoporosis without current pathological fracture: Secondary | ICD-10-CM | POA: Diagnosis not present

## 2022-12-10 DIAGNOSIS — K219 Gastro-esophageal reflux disease without esophagitis: Secondary | ICD-10-CM | POA: Diagnosis not present

## 2022-12-10 DIAGNOSIS — E039 Hypothyroidism, unspecified: Secondary | ICD-10-CM | POA: Diagnosis not present

## 2022-12-10 DIAGNOSIS — I10 Essential (primary) hypertension: Secondary | ICD-10-CM | POA: Diagnosis not present

## 2022-12-10 DIAGNOSIS — L4050 Arthropathic psoriasis, unspecified: Secondary | ICD-10-CM | POA: Diagnosis not present

## 2022-12-10 DIAGNOSIS — Z720 Tobacco use: Secondary | ICD-10-CM | POA: Diagnosis not present

## 2022-12-29 ENCOUNTER — Other Ambulatory Visit: Payer: Self-pay | Admitting: Family Medicine

## 2022-12-29 DIAGNOSIS — M810 Age-related osteoporosis without current pathological fracture: Secondary | ICD-10-CM | POA: Diagnosis not present

## 2022-12-29 NOTE — Telephone Encounter (Signed)
Is this okay to refill? 

## 2023-02-26 ENCOUNTER — Other Ambulatory Visit: Payer: Self-pay | Admitting: Obstetrics and Gynecology

## 2023-02-26 DIAGNOSIS — Z1231 Encounter for screening mammogram for malignant neoplasm of breast: Secondary | ICD-10-CM

## 2023-03-16 ENCOUNTER — Encounter: Payer: Self-pay | Admitting: Internal Medicine

## 2023-03-17 ENCOUNTER — Telehealth: Payer: Self-pay

## 2023-03-17 ENCOUNTER — Other Ambulatory Visit: Payer: Self-pay

## 2023-03-17 DIAGNOSIS — R0989 Other specified symptoms and signs involving the circulatory and respiratory systems: Secondary | ICD-10-CM

## 2023-03-17 NOTE — Telephone Encounter (Signed)
 Copied from CRM [id number]. Topic: Clinical - Lab/Test Results >> Mar 17, 2023 11:40 AM Bobbye Morton wrote: Reason for CRM:  Rebecca Eaton Nurse, Called in to notify the quantaflo reading was mildly low in her right leg, reading was 0.77.  Left leg was normally at 0.92  [facility] requesting that if any updates are needed if you could let pt know before next upcoming appt. Also wanted to note, that they did have to work to get the legs and feet to warm up.

## 2023-03-22 ENCOUNTER — Other Ambulatory Visit: Payer: Self-pay | Admitting: Obstetrics and Gynecology

## 2023-03-22 DIAGNOSIS — M810 Age-related osteoporosis without current pathological fracture: Secondary | ICD-10-CM

## 2023-03-23 ENCOUNTER — Ambulatory Visit (HOSPITAL_COMMUNITY)
Admission: RE | Admit: 2023-03-23 | Discharge: 2023-03-23 | Disposition: A | Discharge status: Home / Self Care | Source: Ambulatory Visit | Attending: Family Medicine | Admitting: Family Medicine

## 2023-03-23 ENCOUNTER — Telehealth: Payer: Self-pay | Admitting: Family Medicine

## 2023-03-23 DIAGNOSIS — R0989 Other specified symptoms and signs involving the circulatory and respiratory systems: Secondary | ICD-10-CM | POA: Diagnosis not present

## 2023-03-23 LAB — VAS US ABI WITH/WO TBI
Left ABI: 1.08
Right ABI: 1.06

## 2023-03-23 NOTE — Telephone Encounter (Signed)
 Pt returned Adams call & was informed U/S results were normal.

## 2023-03-24 DIAGNOSIS — Z780 Asymptomatic menopausal state: Secondary | ICD-10-CM | POA: Diagnosis not present

## 2023-03-25 ENCOUNTER — Ambulatory Visit
Admission: RE | Admit: 2023-03-25 | Discharge: 2023-03-25 | Disposition: A | Discharge status: Home / Self Care | Source: Ambulatory Visit | Attending: Obstetrics and Gynecology | Admitting: Obstetrics and Gynecology

## 2023-03-25 DIAGNOSIS — Z1231 Encounter for screening mammogram for malignant neoplasm of breast: Secondary | ICD-10-CM | POA: Diagnosis not present

## 2023-03-31 DIAGNOSIS — Z1211 Encounter for screening for malignant neoplasm of colon: Secondary | ICD-10-CM | POA: Diagnosis not present

## 2023-03-31 DIAGNOSIS — M810 Age-related osteoporosis without current pathological fracture: Secondary | ICD-10-CM | POA: Diagnosis not present

## 2023-03-31 DIAGNOSIS — Z1231 Encounter for screening mammogram for malignant neoplasm of breast: Secondary | ICD-10-CM | POA: Diagnosis not present

## 2023-03-31 DIAGNOSIS — Z681 Body mass index (BMI) 19 or less, adult: Secondary | ICD-10-CM | POA: Diagnosis not present

## 2023-03-31 DIAGNOSIS — N3281 Overactive bladder: Secondary | ICD-10-CM | POA: Diagnosis not present

## 2023-04-02 NOTE — Progress Notes (Deleted)
 Office Visit Note  Patient: April Cox             Date of Birth: January 19, 1956           MRN: [id number]             PCP: Ronnald Nian, MD Referring: Ronnald Nian, MD Visit Date: 04/14/2023 Occupation: @GUAROCC @  Subjective:  No chief complaint on file.   History of Present Illness: April Cox is a 68 y.o. female ***     Activities of Daily Living:  Patient reports morning stiffness for *** {minute/hour:19697}.   Patient {ACTIONS;DENIES/REPORTS:21021675::"Denies"} nocturnal pain.  Difficulty dressing/grooming: {ACTIONS;DENIES/REPORTS:21021675::"Denies"} Difficulty climbing stairs: {ACTIONS;DENIES/REPORTS:21021675::"Denies"} Difficulty getting out of chair: {ACTIONS;DENIES/REPORTS:21021675::"Denies"} Difficulty using hands for taps, buttons, cutlery, and/or writing: {ACTIONS;DENIES/REPORTS:21021675::"Denies"}  No Rheumatology ROS completed.   PMFS History:  Patient Active Problem List   Diagnosis Date Noted   Nonrheumatic aortic valve stenosis 08/12/2021   Sicca complex (HCC) 04/01/2017   DDD (degenerative disc disease), lumbar 04/01/2017   Psoriasis 04/01/2017   Dry eye 01/01/2017   Spondylosis of lumbar region without myelopathy or radiculopathy 01/07/2016   High risk medication use 01/07/2016   Essential hypertension 09/10/2014   History of HPV infection 09/10/2014   History of colonic polyps 09/10/2014   Osteoporosis 09/10/2014   Gastroesophageal reflux disease without esophagitis 09/10/2014   Cigarette smoker 05/11/2011   Hyperlipidemia 08/22/2010   Hypothyroidism 08/22/2010   Psoriatic arthritis (HCC) 08/22/2010    Past Medical History:  Diagnosis Date   Abnormal Pap smear of cervix    Allergy    seasonal   Anemia    history of anemia   Arthritis    Cataract    bilateral,removed   Diverticulosis of colon    Family history of adverse reaction to anesthesia    mother PONV   GERD (gastroesophageal reflux disease)    Heart murmur     asymptomatic per pt   History of adenomatous polyp of colon    11-06-2009  tubullar   HPV in female    Hyperlipidemia    Hypertension    Hypothyroidism    Irritable bladder    per patient   Mild aortic valve stenosis    per echo 10-09-2010  ef 60-65%  valva area 1.48cm   Osteoporosis    Psoriasis    Psoriatic arthritis (HCC)    Dr. Corliss Skains   Wears glasses     Family History  Problem Relation Age of Onset   Colon polyps Mother    Arthritis Mother        OA    Psoriasis Mother    Other Father        accidental death/MVA at age 55   Hypertension Brother    Other Brother        1 brother drowned   Cancer Brother    Esophageal cancer Brother    Brain cancer Brother    Hypertension Maternal Grandmother    Diabetes Maternal Grandmother    Hypertension Maternal Grandfather    Heart disease Neg Hx    Colon cancer Neg Hx    Stomach cancer Neg Hx    Crohn's disease Neg Hx    Rectal cancer Neg Hx    Ulcerative colitis Neg Hx    Past Surgical History:  Procedure Laterality Date   ABDOMINAL HYSTERECTOMY     ANTERIOR LAT LUMBAR FUSION  12/25/2011   Procedure: ANTERIOR LATERAL LUMBAR FUSION 1 LEVEL;  Surgeon: Tia Alert, MD;  Location: [facility];  Service: Neurosurgery;  Laterality: Left;  LUMBAR FOUR-FIVE   BLADDER SUSPENSION N/A 12/19/2014   Procedure: TRANSVAGINAL TAPE (TVT) PROCEDURE;  Surgeon: Osborn Coho, MD;  Location: [facility];  Service: Gynecology;  Laterality: N/A;   CARPAL TUNNEL RELEASE Right 04/2013   CATARACT EXTRACTION, BILATERAL     05/2020, 06/2020   CERVICAL CONIZATION W/BX N/A 08/28/2014   Procedure: CONIZATION CERVIX WITH BIOPSY;  Surgeon: Kirkland Hun, MD;  Location: [facility];  Service: Gynecology;  Laterality: N/A;   COLONOSCOPY  11/06/2009   COLONOSCOPY  08/2019   COLONOSCOPY  10/2022   CYSTO N/A 12/19/2014   Procedure: CYSTO;  Surgeon: Osborn Coho, MD;  Location: [facility];  Service: Gynecology;  Laterality: N/A;   DENTAL  SURGERY     HYSTEROSCOPY WITH D & C  04/04/2008   KNEE ARTHROPLASTY     LUMBAR PERCUTANEOUS PEDICLE SCREW 1 LEVEL  12/25/2011   Procedure: LUMBAR PERCUTANEOUS PEDICLE SCREW 1 LEVEL;  Surgeon: Tia Alert, MD;  Location: [facility];  Service: Neurosurgery;  Laterality: Left;  LUMBAR FOUR-FIVE   PERONEAL NERVE DECOMPRESSION Left 12/27/2009   for neuropathy with  foot drop   POLYPECTOMY     TRANSTHORACIC ECHOCARDIOGRAM  10/09/2010   mild LVH,  ef 60-65%,  grade I diastolic dysfunction,  very mild AV stenosis (area 1.48cm/S/2(VTI) with no regurg./  mild MV calcification without stenosis or regur. /  trivial TR   VAGINAL HYSTERECTOMY Bilateral 12/19/2014   Procedure: TOTAL VAGINAL HYSTERECTOMY WITH BILATERAL SALPINGECTOMY;  Surgeon: Kirkland Hun, MD;  Location: [facility];  Service: Gynecology;  Laterality: Bilateral;   Social History   Social History Narrative   Not on file   Immunization History  Administered Date(s) Administered   Fluad Quad(high Dose 65+) 10/11/2018   Influenza Split 12/19/2010, 12/01/2011   Influenza, High Dose Seasonal PF 11/16/2020, 11/06/2021, 11/17/2022   Influenza,inj,Quad PF,6+ Mos 01/21/2016   Influenza,inj,quad, With Preservative 12/04/2017   Moderna Covid-19 Fall Seasonal Vaccine 5yrs & older 11/11/2021   PFIZER(Purple Top)SARS-COV-2 Vaccination 02/27/2019, 03/24/2019, 10/02/2019, 05/23/2020   Pfizer Covid-19 Vaccine Bivalent Booster 76yrs & up 10/23/2020   Pfizer(Comirnaty)Fall Seasonal Vaccine 12 years and older 11/22/2022   Pneumococcal Conjugate-13 01/20/2014   Pneumococcal Polysaccharide-23 08/01/2010   Rsv, Bivalent, Protein Subunit Rsvpref,pf Verdis Frederickson) 10/06/2021   Tdap 01/21/2016, 09/15/2017   Zoster Recombinant(Shingrix) 12/11/2017, 02/27/2018     Objective: Vital Signs: There were no vitals taken for this visit.   Physical Exam   Musculoskeletal Exam: ***  CDAI Exam: CDAI Score: -- Patient Global: --; Provider Global: -- Swollen:  --; Tender: -- Joint Exam 04/14/2023   No joint exam has been documented for this visit   There is currently no information documented on the homunculus. Go to the Rheumatology activity and complete the homunculus joint exam.  Investigation: No additional findings.  Imaging: MM 3D SCREENING MAMMOGRAM BILATERAL BREAST Result Date: 04/02/2023 CLINICAL DATA:  Screening. EXAM: DIGITAL SCREENING BILATERAL MAMMOGRAM WITH TOMOSYNTHESIS AND CAD TECHNIQUE: Bilateral screening digital craniocaudal and mediolateral oblique mammograms were obtained. Bilateral screening digital breast tomosynthesis was performed. The images were evaluated with computer-aided detection. COMPARISON:  Previous exam(s). ACR Breast Density Category c: The breasts are heterogeneously dense, which may obscure small masses. FINDINGS: There are no findings suspicious for malignancy. IMPRESSION: No mammographic evidence of malignancy. A result letter of this screening mammogram will be mailed directly to the patient. RECOMMENDATION: Screening mammogram in one year. (Code:SM-B-01Y) BI-RADS CATEGORY  1: Negative. Electronically Signed  By: Frederico Hamman M.D.   On: 04/02/2023 07:14   VAS [place] ABI WITH/WO TBI Result Date: 03/23/2023  LOWER EXTREMITY DOPPLER STUDY Patient Name:  April Cox  Date of Exam:   03/23/2023 Medical Rec #: [id number]       Accession #:    [id number] Date of Birth: 1955/07/14       Patient Gender: F Patient Age:   35 years Exam Location:  [facility] Procedure:      VAS [place] ABI WITH/WO TBI Referring Phys: Sharlot Gowda --------------------------------------------------------------------------------  Indications: Abnormal Qu[employer] exam in office High Risk Factors: Hypertension, hyperlipidemia, current smoker.  Comparison Study: No prior study Performing Technologist: Gertie Fey MHA, RVT, RDCS, RDMS  Examination Guidelines: A complete evaluation includes at minimum, Doppler waveform signals and systolic blood  pressure reading at the level of bilateral brachial, anterior tibial, and posterior tibial arteries, when vessel segments are accessible. Bilateral testing is considered an integral part of a complete examination. Photoelectric Plethysmograph (PPG) waveforms and toe systolic pressure readings are included as required and additional duplex testing as needed. Limited examinations for reoccurring indications may be performed as noted.  ABI Findings: +---------+------------------+-----+---------+--------+ Right    Rt Pressure (mmHg)IndexWaveform Comment  +---------+------------------+-----+---------+--------+ Brachial 185                                      +---------+------------------+-----+---------+--------+ ATA      200               1.08 triphasic         +---------+------------------+-----+---------+--------+ PTA      197               1.06 triphasic         +---------+------------------+-----+---------+--------+ Great Toe134               0.72                   +---------+------------------+-----+---------+--------+ +---------+------------------+-----+---------+-------+ Left     Lt Pressure (mmHg)IndexWaveform Comment +---------+------------------+-----+---------+-------+ Brachial 177                                     +---------+------------------+-----+---------+-------+ ATA      178               0.96 triphasic        +---------+------------------+-----+---------+-------+ PTA      200               1.08 triphasic        +---------+------------------+-----+---------+-------+ Great Toe154               0.83                  +---------+------------------+-----+---------+-------+ +-------+-----------+-----------+------------+------------+ ABI/TBIToday's ABIToday's TBIPrevious ABIPrevious TBI +-------+-----------+-----------+------------+------------+ Right  1.06       0.72                                 +-------+-----------+-----------+------------+------------+ Left   1.08       0.83                                +-------+-----------+-----------+------------+------------+   Summary: Right: Resting right ankle-brachial index is within  normal range. The right toe-brachial index is normal. Left: Resting left ankle-brachial index is within normal range. The left toe-brachial index is normal. *See table(s) above for measurements and observations.  Electronically signed by Heath Lark on 03/23/2023 at 4:11:47 PM.    Final     Recent Labs: Lab Results  Component Value Date   WBC 6.6 09/08/2022   HGB 12.5 09/08/2022   PLT 293 09/08/2022   NA 139 09/08/2022   K 4.7 09/08/2022   CL 103 09/08/2022   CO2 24 09/08/2022   GLUCOSE 85 09/08/2022   BUN 3 (L) 09/08/2022   CREATININE 0.63 09/08/2022   BILITOT 0.5 09/08/2022   ALKPHOS 89 09/08/2022   AST 19 09/08/2022   ALT 16 09/08/2022   PROT 6.6 09/08/2022   ALBUMIN 4.3 09/08/2022   CALCIUM 9.8 09/08/2022   GFRAA 113 04/02/2020   QFTBGOLD Negative 08/02/2015   QFTBGOLDPLUS NEGATIVE 06/10/2022    Speciality Comments: Prior therapy: Remicade and Humira (inadequate response) Osteoporosis managed by PCP. Last DXA August 2019.  Procedures:  No procedures performed Allergies: Bee venom, Codeine, Other, Penicillins, and Gluten meal   Assessment / Plan:     Visit Diagnoses: No diagnosis found.  Orders: No orders of the defined types were placed in this encounter.  No orders of the defined types were placed in this encounter.   Face-to-face time spent with patient was *** minutes. Greater than 50% of time was spent in counseling and coordination of care.  Follow-Up Instructions: No follow-ups on file.   Ellen Henri, CMA  Note - This record has been created using Animal nutritionist.  Chart creation errors have been sought, but may not always  have been located. Such creation errors do not reflect on  the standard of medical  care.

## 2023-04-07 ENCOUNTER — Other Ambulatory Visit: Payer: Self-pay | Admitting: *Deleted

## 2023-04-07 DIAGNOSIS — L409 Psoriasis, unspecified: Secondary | ICD-10-CM | POA: Diagnosis not present

## 2023-04-07 DIAGNOSIS — L4050 Arthropathic psoriasis, unspecified: Secondary | ICD-10-CM

## 2023-04-07 DIAGNOSIS — M3500 Sicca syndrome, unspecified: Secondary | ICD-10-CM

## 2023-04-07 DIAGNOSIS — Z79899 Other long term (current) drug therapy: Secondary | ICD-10-CM | POA: Diagnosis not present

## 2023-04-08 ENCOUNTER — Other Ambulatory Visit: Payer: Self-pay | Admitting: *Deleted

## 2023-04-08 LAB — CBC WITH DIFFERENTIAL/PLATELET
Absolute Lymphocytes: 2683 {cells}/uL (ref 850–3900)
Absolute Monocytes: 289 {cells}/uL (ref 200–950)
Basophils Absolute: 8 {cells}/uL (ref 0–200)
Basophils Relative: 0.1 %
Eosinophils Absolute: 84 {cells}/uL (ref 15–500)
Eosinophils Relative: 1.1 %
HCT: 36.3 % (ref 35.0–45.0)
Hemoglobin: 12.2 g/dL (ref 11.7–15.5)
MCH: 31.4 pg (ref 27.0–33.0)
MCHC: 33.6 g/dL (ref 32.0–36.0)
MCV: 93.3 fL (ref 80.0–100.0)
MPV: 10 fL (ref 7.5–12.5)
Monocytes Relative: 3.8 %
Neutro Abs: 4537 {cells}/uL (ref 1500–7800)
Neutrophils Relative %: 59.7 %
Platelets: 283 10*3/uL (ref 140–400)
RBC: 3.89 10*6/uL (ref 3.80–5.10)
RDW: 13.4 % (ref 11.0–15.0)
Total Lymphocyte: 35.3 %
WBC: 7.6 10*3/uL (ref 3.8–10.8)

## 2023-04-08 LAB — COMPLETE METABOLIC PANEL WITH GFR
AG Ratio: 2.2 (calc) (ref 1.0–2.5)
ALT: 13 U/L (ref 6–29)
AST: 16 U/L (ref 10–35)
Albumin: 4.6 g/dL (ref 3.6–5.1)
Alkaline phosphatase (APISO): 60 U/L (ref 37–153)
BUN: 8 mg/dL (ref 7–25)
CO2: 29 mmol/L (ref 20–32)
Calcium: 9.5 mg/dL (ref 8.6–10.4)
Chloride: 104 mmol/L (ref 98–110)
Creat: 0.64 mg/dL (ref 0.50–1.05)
Globulin: 2.1 g/dL (ref 1.9–3.7)
Glucose, Bld: 93 mg/dL (ref 65–99)
Potassium: 4.6 mmol/L (ref 3.5–5.3)
Sodium: 139 mmol/L (ref 135–146)
Total Bilirubin: 0.5 mg/dL (ref 0.2–1.2)
Total Protein: 6.7 g/dL (ref 6.1–8.1)

## 2023-04-08 MED ORDER — METHOTREXATE SODIUM CHEMO INJECTION 50 MG/2ML: INTRAMUSCULAR | 0 refills | Status: DC

## 2023-04-08 NOTE — Telephone Encounter (Signed)
 Last Fill: 08/16/2022  Labs: 04/07/2023 CBC and CMP are normal.   Next Visit: 04/14/2023  Last Visit: 11/10/2022  DX: Psoriatic arthritis   Current Dose per office note 11/10/2022: methotrexate 1 mL every 7 days   Okay to refill Methotrexate?

## 2023-04-08 NOTE — Progress Notes (Signed)
 CBC and CMP are normal.

## 2023-04-14 ENCOUNTER — Ambulatory Visit: Payer: Medicare Other | Admitting: Rheumatology

## 2023-04-14 DIAGNOSIS — L409 Psoriasis, unspecified: Secondary | ICD-10-CM

## 2023-04-14 DIAGNOSIS — Z8639 Personal history of other endocrine, nutritional and metabolic disease: Secondary | ICD-10-CM

## 2023-04-14 DIAGNOSIS — M19041 Primary osteoarthritis, right hand: Secondary | ICD-10-CM

## 2023-04-14 DIAGNOSIS — F17200 Nicotine dependence, unspecified, uncomplicated: Secondary | ICD-10-CM

## 2023-04-14 DIAGNOSIS — M3500 Sicca syndrome, unspecified: Secondary | ICD-10-CM

## 2023-04-14 DIAGNOSIS — M51369 Other intervertebral disc degeneration, lumbar region without mention of lumbar back pain or lower extremity pain: Secondary | ICD-10-CM

## 2023-04-14 DIAGNOSIS — M810 Age-related osteoporosis without current pathological fracture: Secondary | ICD-10-CM

## 2023-04-14 DIAGNOSIS — L4050 Arthropathic psoriasis, unspecified: Secondary | ICD-10-CM

## 2023-04-14 DIAGNOSIS — Z79899 Other long term (current) drug therapy: Secondary | ICD-10-CM

## 2023-04-14 DIAGNOSIS — Z860100 Personal history of colon polyps, unspecified: Secondary | ICD-10-CM

## 2023-04-14 DIAGNOSIS — Z8719 Personal history of other diseases of the digestive system: Secondary | ICD-10-CM

## 2023-04-14 DIAGNOSIS — M7061 Trochanteric bursitis, right hip: Secondary | ICD-10-CM

## 2023-04-14 DIAGNOSIS — Z8679 Personal history of other diseases of the circulatory system: Secondary | ICD-10-CM

## 2023-04-16 NOTE — Progress Notes (Deleted)
 Office Visit Note  Patient: April Cox             Date of Birth: 01/12/1956           MRN: [id number]             PCP: Ronnald Nian, MD Referring: Ronnald Nian, MD Visit Date: 04/29/2023 Occupation: @GUAROCC @  Subjective:  No chief complaint on file.   History of Present Illness: April Cox is a 68 y.o. female ***     Activities of Daily Living:  Patient reports morning stiffness for *** {minute/hour:19697}.   Patient {ACTIONS;DENIES/REPORTS:21021675::"Denies"} nocturnal pain.  Difficulty dressing/grooming: {ACTIONS;DENIES/REPORTS:21021675::"Denies"} Difficulty climbing stairs: {ACTIONS;DENIES/REPORTS:21021675::"Denies"} Difficulty getting out of chair: {ACTIONS;DENIES/REPORTS:21021675::"Denies"} Difficulty using hands for taps, buttons, cutlery, and/or writing: {ACTIONS;DENIES/REPORTS:21021675::"Denies"}  No Rheumatology ROS completed.   PMFS History:  Patient Active Problem List   Diagnosis Date Noted   Nonrheumatic aortic valve stenosis 08/12/2021   Sicca complex (HCC) 04/01/2017   DDD (degenerative disc disease), lumbar 04/01/2017   Psoriasis 04/01/2017   Dry eye 01/01/2017   Spondylosis of lumbar region without myelopathy or radiculopathy 01/07/2016   High risk medication use 01/07/2016   Essential hypertension 09/10/2014   History of HPV infection 09/10/2014   History of colonic polyps 09/10/2014   Osteoporosis 09/10/2014   Gastroesophageal reflux disease without esophagitis 09/10/2014   Cigarette smoker 05/11/2011   Hyperlipidemia 08/22/2010   Hypothyroidism 08/22/2010   Psoriatic arthritis (HCC) 08/22/2010    Past Medical History:  Diagnosis Date   Abnormal Pap smear of cervix    Allergy    seasonal   Anemia    history of anemia   Arthritis    Cataract    bilateral,removed   Diverticulosis of colon    Family history of adverse reaction to anesthesia    mother PONV   GERD (gastroesophageal reflux disease)    Heart murmur     asymptomatic per pt   History of adenomatous polyp of colon    11-06-2009  tubullar   HPV in female    Hyperlipidemia    Hypertension    Hypothyroidism    Irritable bladder    per patient   Mild aortic valve stenosis    per echo 10-09-2010  ef 60-65%  valva area 1.48cm   Osteoporosis    Psoriasis    Psoriatic arthritis (HCC)    Dr. Corliss Skains   Wears glasses     Family History  Problem Relation Age of Onset   Colon polyps Mother    Arthritis Mother        OA    Psoriasis Mother    Other Father        accidental death/MVA at age 24   Hypertension Brother    Other Brother        1 brother drowned   Cancer Brother    Esophageal cancer Brother    Brain cancer Brother    Hypertension Maternal Grandmother    Diabetes Maternal Grandmother    Hypertension Maternal Grandfather    Heart disease Neg Hx    Colon cancer Neg Hx    Stomach cancer Neg Hx    Crohn's disease Neg Hx    Rectal cancer Neg Hx    Ulcerative colitis Neg Hx    Past Surgical History:  Procedure Laterality Date   ABDOMINAL HYSTERECTOMY     ANTERIOR LAT LUMBAR FUSION  12/25/2011   Procedure: ANTERIOR LATERAL LUMBAR FUSION 1 LEVEL;  Surgeon: Tia Alert, MD;  Location: [facility];  Service: Neurosurgery;  Laterality: Left;  LUMBAR FOUR-FIVE   BLADDER SUSPENSION N/A 12/19/2014   Procedure: TRANSVAGINAL TAPE (TVT) PROCEDURE;  Surgeon: Osborn Coho, MD;  Location: [facility];  Service: Gynecology;  Laterality: N/A;   CARPAL TUNNEL RELEASE Right 04/2013   CATARACT EXTRACTION, BILATERAL     05/2020, 06/2020   CERVICAL CONIZATION W/BX N/A 08/28/2014   Procedure: CONIZATION CERVIX WITH BIOPSY;  Surgeon: Kirkland Hun, MD;  Location: [facility];  Service: Gynecology;  Laterality: N/A;   COLONOSCOPY  11/06/2009   COLONOSCOPY  08/2019   COLONOSCOPY  10/2022   CYSTO N/A 12/19/2014   Procedure: CYSTO;  Surgeon: Osborn Coho, MD;  Location: [facility];  Service: Gynecology;  Laterality: N/A;   DENTAL  SURGERY     HYSTEROSCOPY WITH D & C  04/04/2008   KNEE ARTHROPLASTY     LUMBAR PERCUTANEOUS PEDICLE SCREW 1 LEVEL  12/25/2011   Procedure: LUMBAR PERCUTANEOUS PEDICLE SCREW 1 LEVEL;  Surgeon: Tia Alert, MD;  Location: [facility];  Service: Neurosurgery;  Laterality: Left;  LUMBAR FOUR-FIVE   PERONEAL NERVE DECOMPRESSION Left 12/27/2009   for neuropathy with  foot drop   POLYPECTOMY     TRANSTHORACIC ECHOCARDIOGRAM  10/09/2010   mild LVH,  ef 60-65%,  grade I diastolic dysfunction,  very mild AV stenosis (area 1.48cm/S/2(VTI) with no regurg./  mild MV calcification without stenosis or regur. /  trivial TR   VAGINAL HYSTERECTOMY Bilateral 12/19/2014   Procedure: TOTAL VAGINAL HYSTERECTOMY WITH BILATERAL SALPINGECTOMY;  Surgeon: Kirkland Hun, MD;  Location: [facility];  Service: Gynecology;  Laterality: Bilateral;   Social History   Social History Narrative   Not on file   Immunization History  Administered Date(s) Administered   Fluad Quad(high Dose 65+) 10/11/2018   Influenza Split 12/19/2010, 12/01/2011   Influenza, High Dose Seasonal PF 11/16/2020, 11/06/2021, 11/17/2022   Influenza,inj,Quad PF,6+ Mos 01/21/2016   Influenza,inj,quad, With Preservative 12/04/2017   Moderna Covid-19 Fall Seasonal Vaccine 78yrs & older 11/11/2021   PFIZER(Purple Top)SARS-COV-2 Vaccination 02/27/2019, 03/24/2019, 10/02/2019, 05/23/2020   Pfizer Covid-19 Vaccine Bivalent Booster 50yrs & up 10/23/2020   Pfizer(Comirnaty)Fall Seasonal Vaccine 12 years and older 11/22/2022   Pneumococcal Conjugate-13 01/20/2014   Pneumococcal Polysaccharide-23 08/01/2010   Rsv, Bivalent, Protein Subunit Rsvpref,pf Verdis Frederickson) 10/06/2021   Tdap 01/21/2016, 09/15/2017   Zoster Recombinant(Shingrix) 12/11/2017, 02/27/2018     Objective: Vital Signs: There were no vitals taken for this visit.   Physical Exam   Musculoskeletal Exam: ***  CDAI Exam: CDAI Score: -- Patient Global: --; Provider Global: -- Swollen:  --; Tender: -- Joint Exam 04/29/2023   No joint exam has been documented for this visit   There is currently no information documented on the homunculus. Go to the Rheumatology activity and complete the homunculus joint exam.  Investigation: No additional findings.  Imaging: MM 3D SCREENING MAMMOGRAM BILATERAL BREAST Result Date: 04/02/2023 CLINICAL DATA:  Screening. EXAM: DIGITAL SCREENING BILATERAL MAMMOGRAM WITH TOMOSYNTHESIS AND CAD TECHNIQUE: Bilateral screening digital craniocaudal and mediolateral oblique mammograms were obtained. Bilateral screening digital breast tomosynthesis was performed. The images were evaluated with computer-aided detection. COMPARISON:  Previous exam(s). ACR Breast Density Category c: The breasts are heterogeneously dense, which may obscure small masses. FINDINGS: There are no findings suspicious for malignancy. IMPRESSION: No mammographic evidence of malignancy. A result letter of this screening mammogram will be mailed directly to the patient. RECOMMENDATION: Screening mammogram in one year. (Code:SM-B-01Y) BI-RADS CATEGORY  1: Negative. Electronically Signed  By: Frederico Hamman M.D.   On: 04/02/2023 07:14   VAS [place] ABI WITH/WO TBI Result Date: 03/23/2023  LOWER EXTREMITY DOPPLER STUDY Patient Name:  STACY SAILER  Date of Exam:   03/23/2023 Medical Rec #: [id number]       Accession #:    [id number] Date of Birth: 1955-11-18       Patient Gender: F Patient Age:   56 years Exam Location:  [facility] Procedure:      VAS [place] ABI WITH/WO TBI Referring Phys: Sharlot Gowda --------------------------------------------------------------------------------  Indications: Abnormal [employer] exam in office High Risk Factors: Hypertension, hyperlipidemia, current smoker.  Comparison Study: No prior study Performing Technologist: Gertie Fey MHA, RVT, RDCS, RDMS  Examination Guidelines: A complete evaluation includes at minimum, Doppler waveform signals and systolic blood  pressure reading at the level of bilateral brachial, anterior tibial, and posterior tibial arteries, when vessel segments are accessible. Bilateral testing is considered an integral part of a complete examination. Photoelectric Plethysmograph (PPG) waveforms and toe systolic pressure readings are included as required and additional duplex testing as needed. Limited examinations for reoccurring indications may be performed as noted.  ABI Findings: +---------+------------------+-----+---------+--------+ Right    Rt Pressure (mmHg)IndexWaveform Comment  +---------+------------------+-----+---------+--------+ Brachial 185                                      +---------+------------------+-----+---------+--------+ ATA      200               1.08 triphasic         +---------+------------------+-----+---------+--------+ PTA      197               1.06 triphasic         +---------+------------------+-----+---------+--------+ Great Toe134               0.72                   +---------+------------------+-----+---------+--------+ +---------+------------------+-----+---------+-------+ Left     Lt Pressure (mmHg)IndexWaveform Comment +---------+------------------+-----+---------+-------+ Brachial 177                                     +---------+------------------+-----+---------+-------+ ATA      178               0.96 triphasic        +---------+------------------+-----+---------+-------+ PTA      200               1.08 triphasic        +---------+------------------+-----+---------+-------+ Great Toe154               0.83                  +---------+------------------+-----+---------+-------+ +-------+-----------+-----------+------------+------------+ ABI/TBIToday's ABIToday's TBIPrevious ABIPrevious TBI +-------+-----------+-----------+------------+------------+ Right  1.06       0.72                                 +-------+-----------+-----------+------------+------------+ Left   1.08       0.83                                +-------+-----------+-----------+------------+------------+   Summary: Right: Resting right ankle-brachial index is within  normal range. The right toe-brachial index is normal. Left: Resting left ankle-brachial index is within normal range. The left toe-brachial index is normal. *See table(s) above for measurements and observations.  Electronically signed by Heath Lark on 03/23/2023 at 4:11:47 PM.    Final     Recent Labs: Lab Results  Component Value Date   WBC 7.6 04/07/2023   HGB 12.2 04/07/2023   PLT 283 04/07/2023   NA 139 04/07/2023   K 4.6 04/07/2023   CL 104 04/07/2023   CO2 29 04/07/2023   GLUCOSE 93 04/07/2023   BUN 8 04/07/2023   CREATININE 0.64 04/07/2023   BILITOT 0.5 04/07/2023   ALKPHOS 89 09/08/2022   AST 16 04/07/2023   ALT 13 04/07/2023   PROT 6.7 04/07/2023   ALBUMIN 4.3 09/08/2022   CALCIUM 9.5 04/07/2023   GFRAA 113 04/02/2020   QFTBGOLD Negative 08/02/2015   QFTBGOLDPLUS NEGATIVE 06/10/2022    Speciality Comments: Prior therapy: Remicade and Humira (inadequate response) Osteoporosis managed by PCP. Last DXA August 2019.  Procedures:  No procedures performed Allergies: Bee venom, Codeine, Other, Penicillins, and Gluten meal   Assessment / Plan:     Visit Diagnoses: No diagnosis found.  Orders: No orders of the defined types were placed in this encounter.  No orders of the defined types were placed in this encounter.   Face-to-face time spent with patient was *** minutes. Greater than 50% of time was spent in counseling and coordination of care.  Follow-Up Instructions: No follow-ups on file.   Ellen Henri, CMA  Note - This record has been created using Animal nutritionist.  Chart creation errors have been sought, but may not always  have been located. Such creation errors do not reflect on  the standard of medical care.

## 2023-04-23 ENCOUNTER — Ambulatory Visit: Payer: Self-pay

## 2023-04-23 DIAGNOSIS — H60501 Unspecified acute noninfective otitis externa, right ear: Secondary | ICD-10-CM | POA: Diagnosis not present

## 2023-04-23 NOTE — Telephone Encounter (Signed)
 Copied from CRM [id number]. Topic: Clinical - Red Word Triage >> Apr 23, 2023  3:26 PM Higinio Roger wrote: Red Word that prompted transfer to Nurse Triage: Clear fluid coming from right ear after it popped. Patient stated she has bad allergies and is taking a decongestant.  Symptoms: Stuffy nose, cough  Chief Complaint: Ear drainage Symptoms: Earache, runny nose, fatigue Frequency: Today Pertinent Negatives: Patient denies fever Disposition: [] ED /[x] Urgent Care (no appt availability in office) / [] Appointment(In office/virtual)/ []  [facility]/ [] Home Care/ [] Refused Recommended Disposition /[] [place] [facility]/ []  Follow-up with PCP Additional Notes: Patient called in to report right ear pain and drainage. Patient stated she is also experiencing a runny nose due to allergies. Patient stated her ear "popped" and clear/watery drainage released. Patient rated ear pain a 3 at this time. Patient denied fever. Advised patient to see provider within 24 hours, per protocol. No availability at PCP office or alternate office. Advised UC. Patient complied and stated she would go. Provided care advice and instructed patient to call back if symptoms worsen.   Reason for Disposition  Ear pain  Answer Assessment - Initial Assessment Questions 1. LOCATION: "Which ear is involved?"      Right ear 2. COLOR: "What is the color of the discharge?"      Clear 3. CONSISTENCY: "How runny is the discharge? Could it be water?"      "Seems to be watery", "can feel between fingers" 4. ONSET: "When did you first notice the discharge?"     About an hour ago 5. PAIN: "Is there any earache?" "How bad is it?"  (Scale 1-10; or mild, moderate, severe)     Yes, rates pain about a 3, states pain was at a 10 before ear popped  6. OBJECTS: "Have you put anything in your ear?" (e.g., Q-tip, other object)      Denies 7. OTHER SYMPTOMS: "Do you have any other symptoms?" (e.g., headache, fever, dizziness, vomiting,  runny nose)     Stuffy nose, fatigue  Protocols used: Ear - Discharge-A-AH

## 2023-04-26 ENCOUNTER — Ambulatory Visit (INDEPENDENT_AMBULATORY_CARE_PROVIDER_SITE_OTHER): Admitting: Medical

## 2023-04-26 VITALS — BP 130/82 | HR 59 | Temp 98.1°F | Wt 112.8 lb

## 2023-04-26 DIAGNOSIS — H6121 Impacted cerumen, right ear: Secondary | ICD-10-CM | POA: Diagnosis not present

## 2023-04-26 DIAGNOSIS — H6690 Otitis media, unspecified, unspecified ear: Secondary | ICD-10-CM | POA: Diagnosis not present

## 2023-04-26 DIAGNOSIS — H6120 Impacted cerumen, unspecified ear: Secondary | ICD-10-CM | POA: Diagnosis not present

## 2023-04-26 DIAGNOSIS — Z79899 Other long term (current) drug therapy: Secondary | ICD-10-CM | POA: Diagnosis not present

## 2023-04-26 MED ORDER — CLARITHROMYCIN 500 MG PO TABS: 500.0000 mg | ORAL_TABLET | Freq: Two times a day (BID) | ORAL | 0 refills | Status: DC

## 2023-04-26 NOTE — Progress Notes (Signed)
 Subjective:  April Cox is a 68 y.o. female who presents for Chief Complaint  Patient presents with   right ear stopped up    Right ear stopped, went to [facility] Friday and was given drops and got some ear wax out of it. Still having some drainage, had severe pain on Saturday. Having some pain     Here for ear pain.  She went to urgent care 3 days ago for the same.  She was given Ciprodex antibiotic drops.  They try to get out some of the earwax they were concerned about perforation so they did go any further.  She had severe pain on Saturday of the ear and some pinkish to clear drainage from the ear  Today no pain but just cannot hear out of the right ear.  Husband has been recently sick with sinus infection  No fever, no bodyaches or chills, no nausea or vomiting.  Using over-the-counter guaifenesin  No other aggravating or relieving factors.    No other c/o.  The following portions of the patient's history were reviewed and updated as appropriate: allergies, current medications, past family history, past medical history, past social history, past surgical history and problem list.  ROS Otherwise as in subjective above  Objective: BP 130/82   Pulse (!) 59   Temp 98.1 F (36.7 C)   Wt 112 lb 12.8 oz (51.2 kg)   BMI 18.77 kg/m   General appearance: alert, no distress, well developed, well nourished HEENT: normocephalic, sclerae anicteric, conjunctiva pink and moist, left TM pearly, Right TM bulging erythematous, no obvious rupture, nares patent, no discharge or erythema, pharynx normal Oral cavity: MMM, no lesions Neck: supple, no lymphadenopathy, no thyromegaly, no masses   Assessment: Encounter Diagnoses  Name Primary?   Acute otitis media, unspecified otitis media type Yes   High risk medication use      Plan: We discussed exam findings, symptoms.  Continue Ciprodex and finish out the course of the topical antibiotic, begin Biaxin oral antibiotic.  Hold off on statin  cholesterol medicine while you are taking the Biaxin antibiotic.  Continue to hydrate well, rest, continue guaifenesin over-the-counter another several days.  If not fully resolved over the course of the next 7 to 10 days, then  recheck  [facility]" was seen today for right ear stopped up.  Diagnoses and all orders for this visit:  Acute otitis media, unspecified otitis media type  High risk medication use  Other orders -     clarithromycin (BIAXIN) 500 MG tablet; Take 1 tablet (500 mg total) by mouth 2 (two) times daily.    Follow up: As needed

## 2023-04-28 ENCOUNTER — Encounter: Payer: Self-pay | Admitting: Family Medicine

## 2023-04-28 ENCOUNTER — Ambulatory Visit: Payer: Medicare Other | Admitting: Family Medicine

## 2023-04-28 VITALS — BP 140/82 | HR 86 | Ht 65.0 in | Wt 114.2 lb

## 2023-04-28 DIAGNOSIS — K219 Gastro-esophageal reflux disease without esophagitis: Secondary | ICD-10-CM

## 2023-04-28 DIAGNOSIS — Z0000 Encounter for general adult medical examination without abnormal findings: Secondary | ICD-10-CM | POA: Diagnosis not present

## 2023-04-28 DIAGNOSIS — M51369 Other intervertebral disc degeneration, lumbar region without mention of lumbar back pain or lower extremity pain: Secondary | ICD-10-CM

## 2023-04-28 DIAGNOSIS — E785 Hyperlipidemia, unspecified: Secondary | ICD-10-CM | POA: Diagnosis not present

## 2023-04-28 DIAGNOSIS — H6501 Acute serous otitis media, right ear: Secondary | ICD-10-CM

## 2023-04-28 DIAGNOSIS — M3500 Sicca syndrome, unspecified: Secondary | ICD-10-CM | POA: Diagnosis not present

## 2023-04-28 DIAGNOSIS — L409 Psoriasis, unspecified: Secondary | ICD-10-CM | POA: Diagnosis not present

## 2023-04-28 DIAGNOSIS — F17210 Nicotine dependence, cigarettes, uncomplicated: Secondary | ICD-10-CM | POA: Diagnosis not present

## 2023-04-28 DIAGNOSIS — E782 Mixed hyperlipidemia: Secondary | ICD-10-CM

## 2023-04-28 DIAGNOSIS — I350 Nonrheumatic aortic (valve) stenosis: Secondary | ICD-10-CM | POA: Diagnosis not present

## 2023-04-28 DIAGNOSIS — E038 Other specified hypothyroidism: Secondary | ICD-10-CM | POA: Diagnosis not present

## 2023-04-28 DIAGNOSIS — I10 Essential (primary) hypertension: Secondary | ICD-10-CM

## 2023-04-28 DIAGNOSIS — F1021 Alcohol dependence, in remission: Secondary | ICD-10-CM

## 2023-04-28 DIAGNOSIS — L4050 Arthropathic psoriasis, unspecified: Secondary | ICD-10-CM | POA: Diagnosis not present

## 2023-04-28 DIAGNOSIS — Z79899 Other long term (current) drug therapy: Secondary | ICD-10-CM

## 2023-04-28 DIAGNOSIS — M818 Other osteoporosis without current pathological fracture: Secondary | ICD-10-CM | POA: Diagnosis not present

## 2023-04-28 DIAGNOSIS — Z23 Encounter for immunization: Secondary | ICD-10-CM

## 2023-04-28 DIAGNOSIS — M47816 Spondylosis without myelopathy or radiculopathy, lumbar region: Secondary | ICD-10-CM

## 2023-04-28 MED ORDER — LEVOTHYROXINE SODIUM 25 MCG PO TABS: ORAL_TABLET | ORAL | 1 refills | Status: DC

## 2023-04-28 MED ORDER — PANTOPRAZOLE SODIUM 40 MG PO TBEC: 40.0000 mg | DELAYED_RELEASE_TABLET | Freq: Every day | ORAL | 1 refills | Status: DC

## 2023-04-28 MED ORDER — METOPROLOL SUCCINATE ER 50 MG PO TB24: ORAL_TABLET | ORAL | 1 refills | Status: AC

## 2023-04-28 MED ORDER — ATORVASTATIN CALCIUM 40 MG PO TABS: 40.0000 mg | ORAL_TABLET | Freq: Every day | ORAL | 1 refills | Status: AC

## 2023-04-28 NOTE — Progress Notes (Signed)
 Complete physical exam  Patient: April Cox   DOB: 11-18-1955   68 y.o. Female  MRN: [id number]  Subjective:    Chief Complaint  Patient presents with   Annual Exam    Medicare well visit. CPE. Has ear infection not feeling better ear is still stopped up. No energy. Been on ear drops sense Friday. When she finally got her ear to pop fluid came out. Vincenza Hews said her ear drum is really read or swollen. Shane prescribed antibiotics. Has been on oral antibiotics sense Monday. Pain occurs randomly. Still can not hear out of her right ear. Nonfasating, has to eat with antibiotics.     April Cox is a 68 y.o. female who presents today for a complete physical exam.  She reports consuming a general diet. Home exercise routine includes walks her dog. She generally feels well. She reports sleeping well.  She continues to smoke and is not ready to quit.  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  She does not go to AA as for her it does not really fit.  She is followed by rheumatology and continues on methotrexate as well as folic acid.  She continues on her thyroid medication.  She is also on metoprolol for her blood pressure.  Continues on atorvastatin and is having no difficulty with that medication.  She recently finished her second dose of Reclast and apparently did have a DEXA scan which showed improvement but I do not have a copy of it.  She is using estrogen product to help with bladder related problems.  She continues to have some slight discomfort in the right ear.  She does have a previous history of otitis externa and media she and her husband seem to be getting along quite nicely.  Most recent fall risk assessment:    04/28/2023    1:48 PM  Fall Risk   Falls in the past year? 0  Number falls in past yr: 0  Injury with Fall? 0  Risk for fall due to : No Fall Risks  Follow up Falls evaluation completed     Most recent depression screenings:    04/28/2023    1:48 PM 10/06/2022    8:50 AM   PHQ 2/9 Scores  PHQ - 2 Score 0 0  PHQ- 9 Score  0    Vision:Within last year and Dental: No current dental problems and Last dental visit: a year ago    Immunization History  Administered Date(s) Administered   Fluad Quad(high Dose 65+) 10/11/2018   Influenza Split 12/19/2010, 12/01/2011   Influenza, High Dose Seasonal PF 11/16/2020, 11/06/2021, 11/17/2022   Influenza,inj,Quad PF,6+ Mos 01/21/2016   Influenza,inj,quad, With Preservative 12/04/2017   Moderna Covid-19 Fall Seasonal Vaccine 50yrs & older 11/11/2021   PFIZER(Purple Top)SARS-COV-2 Vaccination 02/27/2019, 03/24/2019, 10/02/2019, 05/23/2020   Pfizer Covid-19 Vaccine Bivalent Booster 27yrs & up 10/23/2020   Pfizer(Comirnaty)Fall Seasonal Vaccine 12 years and older 11/22/2022   Pneumococcal Conjugate-13 01/20/2014   Pneumococcal Polysaccharide-23 08/01/2010   Rsv, Bivalent, Protein Subunit Rsvpref,pf Verdis Frederickson) 10/06/2021   Tdap 01/21/2016, 09/15/2017   Zoster Recombinant(Shingrix) 12/11/2017, 02/27/2018    Health Maintenance  Topic Date Due   COVID-19 Vaccine (8 - Pfizer risk 2024-25 season) 05/22/2023   INFLUENZA VACCINE  08/20/2023   Medicare Annual Wellness (AWV)  10/06/2023   MAMMOGRAM  03/24/2025   DTaP/Tdap/Td (3 - Td or Tdap) 09/16/2027   Colonoscopy  10/27/2027   DEXA SCAN  Completed   Hepatitis C Screening  Completed   Zoster Vaccines- Shingrix  Completed   HPV VACCINES  Aged Out   Pneumonia Vaccine 33+ Years old  Discontinued    Patient Care Team: Ronnald Nian, MD as PCP - General (Family Medicine) Pa, [facility] Rivard, Dois Davenport, MD as Consulting Physician (Obstetrics and Gynecology) Pollyann Savoy, MD as Consulting Physician (Rheumatology)   Outpatient Medications Prior to Visit  Medication Sig   calcium-vitamin D (OSCAL WITH D) 500-200 MG-UNIT per tablet Take 1 tablet by mouth daily.   Cholecalciferol (VITAMIN D PO) Take 500 mg by mouth daily.    clarithromycin (BIAXIN) 500 MG  tablet Take 1 tablet (500 mg total) by mouth 2 (two) times daily.   CRANBERRY PO Take by mouth daily.   EPINEPHrine 0.3 mg/0.3 mL IJ SOAJ injection INJECT INTO THE MIDDLE OF THE OUTER THIGH AND HOLD FOR 10 SECONDS AS NEEDED FOR SEVERE ALLERGIC REACTION THEN CALL 911 IF USED   Estradiol 10 MCG TABS vaginal tablet Place vaginally.   folic acid (FOLVITE) 1 MG tablet TAKE 2 TABLETS BY MOUTH DAILY   methotrexate 50 MG/2ML injection INJECT 1 MILLILITER (CC) UNDER THE SKIN ONCE WEEKLY   MONOJECT 1CC TB SYR 27GX1/2" 27G X 1/2" 1 ML MISC USE AS DIRECTED   Multiple Vitamins-Minerals (PRESERVISION AREDS PO) Take by mouth daily. Take one daily   Secukinumab, 300 MG Dose, (COSENTYX SENSOREADY, 300 MG,) 150 MG/ML SOAJ INJECT 1 PEN UNDER THE SKIN (SUBCUTANEOUS INJECTION) EVERY 14 DAYS   vitamin C (ASCORBIC ACID) 250 MG tablet Take 250 mg by mouth daily.   XIIDRA 5 % SOLN 2 (two) times daily.   Zoledronic Acid (RECLAST IV) Inject into the vein. First IV: 12/2021   Polyvinyl Alcohol-Povidone (REFRESH OP) Apply to eye as needed. (Patient not taking: Reported on 04/28/2023)   [DISCONTINUED] atorvastatin (LIPITOR) 40 MG tablet Take 1 tablet (40 mg total) by mouth daily.   [DISCONTINUED] clobetasol ointment (TEMOVATE) 0.05 % Apply 1 application  topically as needed (psiorisis). (Patient not taking: Reported on 04/28/2023)   [DISCONTINUED] levothyroxine (SYNTHROID) 25 MCG tablet TAKE 1 TABLET BY MOUTH DAILY BEFORE BREAKFAST   [DISCONTINUED] metoprolol succinate (TOPROL-XL) 50 MG 24 hr tablet TAKE 1 TABLET BY MOUTH DAILY IMMEDIATELY FOLLOWING A MEAL   [DISCONTINUED] pantoprazole (PROTONIX) 40 MG tablet Take 1 tablet (40 mg total) by mouth daily.   No facility-administered medications prior to visit.    Review of Systems  All other systems reviewed and are negative.   Family and social history as well as health maintenance and immunizations was reviewed.     Objective:    BP (!) 140/82   Pulse 86   Ht 5\' 5"   (1.651 m)   Wt 114 lb 3.2 oz (51.8 kg)   SpO2 96%   BMI 19.00 kg/m    Physical Exam  Alert and in no distress. Tympanic membrane on the right is dull and vascular with some drainage in the canal.  Left TM and canal normal pharyngeal area is normal. Neck is supple without adenopathy or thyromegaly. Cardiac exam shows a regular sinus rhythm without murmurs or gallops. Lungs are clear to auscultation.      Assessment & Plan:     Routine general medical examination at a health care facility  Cigarette smoker  Essential hypertension - Plan: metoprolol succinate (TOPROL-XL) 50 MG 24 hr tablet  Gastroesophageal reflux disease without esophagitis - Plan: pantoprazole (PROTONIX) 40 MG tablet  High risk medication use  Other osteoporosis without current pathological fracture  Nonrheumatic aortic valve stenosis  Other specified hypothyroidism - Plan: levothyroxine (SYNTHROID) 25 MCG tablet  Psoriatic arthritis (HCC)  Sicca complex (HCC)  Hyperlipidemia, unspecified hyperlipidemia type - Plan: atorvastatin (LIPITOR) 40 MG tablet  Psoriasis  Recovering alcoholic in remission (HCC)  Non-recurrent acute serous otitis media of right ear  She wants to come back at a later date to get her lipid panel checked and also follow-up on immunizations.  She will also let me know how her ear is doing as she might need a little bit longer course of an antibiotic. Return in about 1 year (around 04/27/2024).      Sharlot Gowda, MD

## 2023-04-29 ENCOUNTER — Ambulatory Visit: Admitting: Rheumatology

## 2023-04-29 DIAGNOSIS — Z79899 Other long term (current) drug therapy: Secondary | ICD-10-CM

## 2023-04-29 DIAGNOSIS — F17200 Nicotine dependence, unspecified, uncomplicated: Secondary | ICD-10-CM

## 2023-04-29 DIAGNOSIS — Z8679 Personal history of other diseases of the circulatory system: Secondary | ICD-10-CM

## 2023-04-29 DIAGNOSIS — M3500 Sicca syndrome, unspecified: Secondary | ICD-10-CM

## 2023-04-29 DIAGNOSIS — Z8719 Personal history of other diseases of the digestive system: Secondary | ICD-10-CM

## 2023-04-29 DIAGNOSIS — M51369 Other intervertebral disc degeneration, lumbar region without mention of lumbar back pain or lower extremity pain: Secondary | ICD-10-CM

## 2023-04-29 DIAGNOSIS — Z860100 Personal history of colon polyps, unspecified: Secondary | ICD-10-CM

## 2023-04-29 DIAGNOSIS — M7061 Trochanteric bursitis, right hip: Secondary | ICD-10-CM

## 2023-04-29 DIAGNOSIS — Z8639 Personal history of other endocrine, nutritional and metabolic disease: Secondary | ICD-10-CM

## 2023-04-29 DIAGNOSIS — L4050 Arthropathic psoriasis, unspecified: Secondary | ICD-10-CM

## 2023-04-29 DIAGNOSIS — M810 Age-related osteoporosis without current pathological fracture: Secondary | ICD-10-CM

## 2023-04-29 DIAGNOSIS — L409 Psoriasis, unspecified: Secondary | ICD-10-CM

## 2023-04-29 DIAGNOSIS — M19041 Primary osteoarthritis, right hand: Secondary | ICD-10-CM

## 2023-04-30 NOTE — Progress Notes (Deleted)
 Office Visit Note  Patient: April Cox             Date of Birth: 1955/02/19           MRN: [id number]             PCP: Ronnald Nian, MD Referring: Ronnald Nian, MD Visit Date: 05/03/2023 Occupation: @GUAROCC @  Subjective:  No chief complaint on file.   History of Present Illness: April Cox is a 68 y.o. female ***     Activities of Daily Living:  Patient reports morning stiffness for *** {minute/hour:19697}.   Patient {ACTIONS;DENIES/REPORTS:21021675::"Denies"} nocturnal pain.  Difficulty dressing/grooming: {ACTIONS;DENIES/REPORTS:21021675::"Denies"} Difficulty climbing stairs: {ACTIONS;DENIES/REPORTS:21021675::"Denies"} Difficulty getting out of chair: {ACTIONS;DENIES/REPORTS:21021675::"Denies"} Difficulty using hands for taps, buttons, cutlery, and/or writing: {ACTIONS;DENIES/REPORTS:21021675::"Denies"}  No Rheumatology ROS completed.   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   Nonrheumatic aortic valve stenosis 08/12/2021   Sicca complex (HCC) 04/01/2017   Psoriasis 04/01/2017   Dry eye 01/01/2017   Spondylosis of lumbar region without myelopathy or radiculopathy 01/07/2016   High risk medication use 01/07/2016   Essential hypertension 09/10/2014   History of HPV infection 09/10/2014   History of colonic polyps 09/10/2014   Osteoporosis 09/10/2014   Gastroesophageal reflux disease without esophagitis 09/10/2014   Cigarette smoker 05/11/2011   Hyperlipidemia 08/22/2010   Hypothyroidism 08/22/2010   Psoriatic arthritis (HCC) 08/22/2010    Past Medical History:  Diagnosis Date   Abnormal Pap smear of cervix    Allergy    seasonal   Anemia    history of anemia   Arthritis    Cataract    bilateral,removed   Diverticulosis of colon    Family history of adverse reaction to anesthesia    mother PONV   GERD (gastroesophageal reflux disease)    Heart murmur     asymptomatic per pt   History of adenomatous polyp of colon    11-06-2009  tubullar   HPV in female    Hyperlipidemia    Hypertension    Hypothyroidism    Irritable bladder    per patient   Mild aortic valve stenosis    per echo 10-09-2010  ef 60-65%  valva area 1.48cm   Osteoporosis    Psoriasis    Psoriatic arthritis (HCC)    Dr. Corliss Skains   Wears glasses     Family History  Problem Relation Age of Onset   Colon polyps Mother    Arthritis Mother        OA    Psoriasis Mother    Other Father        accidental death/MVA at age 57   Hypertension Brother    Other Brother        1 brother drowned   Cancer Brother    Esophageal cancer Brother    Brain cancer Brother    Hypertension Maternal Grandmother    Diabetes Maternal Grandmother    Hypertension Maternal Grandfather    Heart disease Neg Hx    Colon cancer Neg Hx    Stomach cancer Neg Hx    Crohn's disease Neg Hx    Rectal cancer Neg Hx    Ulcerative colitis Neg Hx    Past Surgical History:  Procedure Laterality Date   ABDOMINAL HYSTERECTOMY     ANTERIOR LAT LUMBAR FUSION  12/25/2011   Procedure: ANTERIOR LATERAL LUMBAR FUSION 1 LEVEL;  Surgeon: Tia Alert, MD;  Location: [facility];  Service: Neurosurgery;  Laterality: Left;  LUMBAR FOUR-FIVE   BLADDER SUSPENSION N/A 12/19/2014   Procedure: TRANSVAGINAL TAPE (TVT) PROCEDURE;  Surgeon: Osborn Coho, MD;  Location: [facility];  Service: Gynecology;  Laterality: N/A;   CARPAL TUNNEL RELEASE Right 04/2013   CATARACT EXTRACTION, BILATERAL     05/2020, 06/2020   CERVICAL CONIZATION W/BX N/A 08/28/2014   Procedure: CONIZATION CERVIX WITH BIOPSY;  Surgeon: Kirkland Hun, MD;  Location: [facility];  Service: Gynecology;  Laterality: N/A;   COLONOSCOPY  11/06/2009   COLONOSCOPY  08/2019   COLONOSCOPY  10/2022   CYSTO N/A 12/19/2014   Procedure: CYSTO;  Surgeon: Osborn Coho, MD;  Location: [facility];  Service: Gynecology;  Laterality: N/A;   DENTAL  SURGERY     HYSTEROSCOPY WITH D & C  04/04/2008   KNEE ARTHROPLASTY     LUMBAR PERCUTANEOUS PEDICLE SCREW 1 LEVEL  12/25/2011   Procedure: LUMBAR PERCUTANEOUS PEDICLE SCREW 1 LEVEL;  Surgeon: Tia Alert, MD;  Location: [facility];  Service: Neurosurgery;  Laterality: Left;  LUMBAR FOUR-FIVE   PERONEAL NERVE DECOMPRESSION Left 12/27/2009   for neuropathy with  foot drop   POLYPECTOMY     TRANSTHORACIC ECHOCARDIOGRAM  10/09/2010   mild LVH,  ef 60-65%,  grade I diastolic dysfunction,  very mild AV stenosis (area 1.48cm/S/2(VTI) with no regurg./  mild MV calcification without stenosis or regur. /  trivial TR   VAGINAL HYSTERECTOMY Bilateral 12/19/2014   Procedure: TOTAL VAGINAL HYSTERECTOMY WITH BILATERAL SALPINGECTOMY;  Surgeon: Kirkland Hun, MD;  Location: [facility];  Service: Gynecology;  Laterality: Bilateral;   Social History   Social History Narrative   Not on file   Immunization History  Administered Date(s) Administered   Fluad Quad(high Dose 65+) 10/11/2018   Influenza Split 12/19/2010, 12/01/2011   Influenza, High Dose Seasonal PF 11/16/2020, 11/06/2021, 11/17/2022   Influenza,inj,Quad PF,6+ Mos 01/21/2016   Influenza,inj,quad, With Preservative 12/04/2017   Moderna Covid-19 Fall Seasonal Vaccine 79yrs & older 11/11/2021   PFIZER(Purple Top)SARS-COV-2 Vaccination 02/27/2019, 03/24/2019, 10/02/2019, 05/23/2020   Pfizer Covid-19 Vaccine Bivalent Booster 57yrs & up 10/23/2020   Pfizer(Comirnaty)Fall Seasonal Vaccine 12 years and older 11/22/2022   Pneumococcal Conjugate-13 01/20/2014   Pneumococcal Polysaccharide-23 08/01/2010   Rsv, Bivalent, Protein Subunit Rsvpref,pf Verdis Frederickson) 10/06/2021   Tdap 01/21/2016, 09/15/2017   Zoster Recombinant(Shingrix) 12/11/2017, 02/27/2018     Objective: Vital Signs: There were no vitals taken for this visit.   Physical Exam   Musculoskeletal Exam: ***  CDAI Exam: CDAI Score: -- Patient Global: --; Provider Global: -- Swollen:  --; Tender: -- Joint Exam 05/03/2023   No joint exam has been documented for this visit   There is currently no information documented on the homunculus. Go to the Rheumatology activity and complete the homunculus joint exam.  Investigation: No additional findings.  Imaging: No results found.  Recent Labs: Lab Results  Component Value Date   WBC 7.6 04/07/2023   HGB 12.2 04/07/2023   PLT 283 04/07/2023   NA 139 04/07/2023   K 4.6 04/07/2023   CL 104 04/07/2023   CO2 29 04/07/2023   GLUCOSE 93 04/07/2023   BUN 8 04/07/2023   CREATININE 0.64 04/07/2023   BILITOT 0.5 04/07/2023   ALKPHOS 89 09/08/2022   AST 16 04/07/2023   ALT 13 04/07/2023   PROT 6.7 04/07/2023   ALBUMIN 4.3 09/08/2022   CALCIUM 9.5 04/07/2023   GFRAA 113 04/02/2020   QFTBGOLD Negative 08/02/2015  QFTBGOLDPLUS NEGATIVE 06/10/2022    Speciality Comments: Prior therapy: Remicade and Humira (inadequate response) Osteoporosis managed by PCP. Last DXA August 2019.  Procedures:  No procedures performed Allergies: Bee venom, Codeine, Other, Penicillins, and Gluten meal   Assessment / Plan:     Visit Diagnoses: No diagnosis found.  Orders: No orders of the defined types were placed in this encounter.  No orders of the defined types were placed in this encounter.   Face-to-face time spent with patient was *** minutes. Greater than 50% of time was spent in counseling and coordination of care.  Follow-Up Instructions: No follow-ups on file.   Ellen Henri, CMA  Note - This record has been created using Animal nutritionist.  Chart creation errors have been sought, but may not always  have been located. Such creation errors do not reflect on  the standard of medical care.

## 2023-05-03 ENCOUNTER — Ambulatory Visit: Admitting: Family Medicine

## 2023-05-03 ENCOUNTER — Encounter: Payer: Self-pay | Admitting: Family Medicine

## 2023-05-03 ENCOUNTER — Ambulatory Visit: Payer: Self-pay

## 2023-05-03 ENCOUNTER — Ambulatory Visit: Admitting: Rheumatology

## 2023-05-03 VITALS — BP 130/70 | HR 80 | Temp 97.8°F | Ht 65.0 in | Wt 113.8 lb

## 2023-05-03 DIAGNOSIS — M7061 Trochanteric bursitis, right hip: Secondary | ICD-10-CM

## 2023-05-03 DIAGNOSIS — Z8679 Personal history of other diseases of the circulatory system: Secondary | ICD-10-CM

## 2023-05-03 DIAGNOSIS — M3500 Sicca syndrome, unspecified: Secondary | ICD-10-CM

## 2023-05-03 DIAGNOSIS — Z8639 Personal history of other endocrine, nutritional and metabolic disease: Secondary | ICD-10-CM

## 2023-05-03 DIAGNOSIS — H9191 Unspecified hearing loss, right ear: Secondary | ICD-10-CM | POA: Diagnosis not present

## 2023-05-03 DIAGNOSIS — M51369 Other intervertebral disc degeneration, lumbar region without mention of lumbar back pain or lower extremity pain: Secondary | ICD-10-CM

## 2023-05-03 DIAGNOSIS — Z860100 Personal history of colon polyps, unspecified: Secondary | ICD-10-CM

## 2023-05-03 DIAGNOSIS — Z79899 Other long term (current) drug therapy: Secondary | ICD-10-CM

## 2023-05-03 DIAGNOSIS — M19041 Primary osteoarthritis, right hand: Secondary | ICD-10-CM

## 2023-05-03 DIAGNOSIS — L409 Psoriasis, unspecified: Secondary | ICD-10-CM

## 2023-05-03 DIAGNOSIS — F17200 Nicotine dependence, unspecified, uncomplicated: Secondary | ICD-10-CM

## 2023-05-03 DIAGNOSIS — Z8719 Personal history of other diseases of the digestive system: Secondary | ICD-10-CM

## 2023-05-03 DIAGNOSIS — L4050 Arthropathic psoriasis, unspecified: Secondary | ICD-10-CM

## 2023-05-03 DIAGNOSIS — M810 Age-related osteoporosis without current pathological fracture: Secondary | ICD-10-CM

## 2023-05-03 DIAGNOSIS — J309 Allergic rhinitis, unspecified: Secondary | ICD-10-CM | POA: Diagnosis not present

## 2023-05-03 MED ORDER — METHYLPREDNISOLONE 4 MG PO TBPK: ORAL_TABLET | ORAL | 0 refills | Status: DC

## 2023-05-03 NOTE — Progress Notes (Signed)
 Chief Complaint  Patient presents with   Otalgia    Has had ear pain, has been sharp. She went to [facility] 04/23/23 due to ear draining fluid, not for ear pain. Feels like there is a sound that is like crickets in there now and cannot hear out of her ear at all. JCL put her on abx last week and told her to f/u today, might need ENT referral. She is having some balance issues.    4/4 UC visit--allergies flaring, with nasal congestion. R ear--had cerumen removed. Exudate/discharge was present in the R ear. Prescribed  Ciprodex drops 4/7 Shane--no pain, but couldn't hear out of R ear. R TM bulging and red, no rupture. He added Biaxin, told to continue the ciprodex. 4/9 CPE with Dr. Robina Chol.  She notes ringing in R ear that started 2 days ago. She still can't hear out of the right ear. Intermittent stabbing pain in the R ear, sometimes has a dull ache. Both are short-lived.  Overall not having much pain or discomfort. No significant drainage from the right ear (much improved), and no further pink-tinge to any discharge.  Allergies have improved since onset of these symptoms.  In the last several days she has noted her balance to be a bit off.  Some bumping into walls, fleeting issues with balance. Denies vertigo.     PMH, PSH, SH reviewed  Outpatient Encounter Medications as of 05/03/2023  Medication Sig Note   calcium-vitamin D (OSCAL WITH D) 500-200 MG-UNIT per tablet Take 1 tablet by mouth daily.    Cholecalciferol (VITAMIN D PO) Take 500 mg by mouth daily.     clarithromycin (BIAXIN) 500 MG tablet Take 1 tablet (500 mg total) by mouth 2 (two) times daily.    CRANBERRY PO Take by mouth daily.    Estradiol 10 MCG TABS vaginal tablet Place vaginally.    folic acid (FOLVITE) 1 MG tablet TAKE 2 TABLETS BY MOUTH DAILY    levothyroxine (SYNTHROID) 25 MCG tablet TAKE 1 TABLET BY MOUTH DAILY BEFORE BREAKFAST    methotrexate 50 MG/2ML injection INJECT 1 MILLILITER (CC) UNDER THE SKIN ONCE WEEKLY     metoprolol succinate (TOPROL-XL) 50 MG 24 hr tablet TAKE 1 TABLET BY MOUTH DAILY IMMEDIATELY FOLLOWING A MEAL    MONOJECT 1CC TB SYR 27GX1/2" 27G X 1/2" 1 ML MISC USE AS DIRECTED    Multiple Vitamins-Minerals (PRESERVISION AREDS PO) Take by mouth daily. Take one daily    pantoprazole (PROTONIX) 40 MG tablet Take 1 tablet (40 mg total) by mouth daily.    Polyvinyl Alcohol-Povidone (REFRESH OP) Apply to eye as needed.    vitamin C (ASCORBIC ACID) 250 MG tablet Take 250 mg by mouth daily.    XIIDRA 5 % SOLN 2 (two) times daily.    Zoledronic Acid (RECLAST IV) Inject into the vein. First IV: 12/2021    atorvastatin (LIPITOR) 40 MG tablet Take 1 tablet (40 mg total) by mouth daily. (Patient not taking: Reported on 05/03/2023) 05/03/2023: On hold due to abx   EPINEPHrine 0.3 mg/0.3 mL IJ SOAJ injection INJECT INTO THE MIDDLE OF THE OUTER THIGH AND HOLD FOR 10 SECONDS AS NEEDED FOR SEVERE ALLERGIC REACTION THEN CALL 911 IF USED (Patient not taking: Reported on 05/03/2023)    Secukinumab, 300 MG Dose, (COSENTYX SENSOREADY, 300 MG,) 150 MG/ML SOAJ INJECT 1 PEN UNDER THE SKIN (SUBCUTANEOUS INJECTION) EVERY 14 DAYS (Patient not taking: Reported on 05/03/2023) 05/03/2023: On hold due to insurance   No facility-administered encounter  medications on file as of 05/03/2023.   Allergies  Allergen Reactions   Bee Venom Anaphylaxis   Codeine Hives and Nausea And Vomiting    REACTION: severe    Other Anaphylaxis    Bee stings   Penicillins Shortness Of Breath, Swelling and Rash   Gluten Meal Swelling and Other (See Comments)    Inflammation  (this a trigger due to Psoriatic arthritis     ROS: no fever, chills, sinus pain. Slight PND, no cough. No HA's, lightheadedness, chest pain or shortness of breath. No n/v/d No bleeding, bruising, rash.     PHYSICAL EXAM:  BP 130/70   Pulse 80   Temp 97.8 F (36.6 C) (Tympanic)   Ht 5\' 5"  (1.651 m)   Wt 113 lb 12.8 oz (51.6 kg)   BMI 18.94 kg/m    Well-appearing female in no distress HEENT: PERRL, EOMI, conjunctiva and sclera are clear.  TM's and EAC's are normal bilaterally. Decreased hearing R ear. Nasal mucuosa is normal, no erythema or purulence.  Sinuses nontender OP is clear Neck: no lymphadenopathy Heart: regular rate and rhythm with murmur at RUSB Lungs: clear Neuro: alert and oriented, cranial nerves grossly intact. Normal gait Psych: normal mood, affect, hygiene and grooming    ASSESSMENT/PLAN:  Hearing loss of right ear, unspecified hearing loss type - OM and OE resolving with ABX, complete course. Exam appears normal. Treat with steroids and refer to ENT for acute hearing loss R ear - Plan: methylPREDNISolone (MEDROL DOSEPAK) 4 MG TBPK tablet, Ambulatory referral to ENT  Allergic rhinitis, unspecified seasonality, unspecified trigger - improved  Complete the antibiotics as prescribed. Your infections have responded very nicely to them.  The ear canal and ear drum are now normal. We are putting you on oral steroids to help with the hearing loss in the right ear, and we have put in a referral to the ENT for further evaluation. (You can cancel that appointment if your hearing resolves).

## 2023-05-03 NOTE — Patient Instructions (Signed)
  Complete the antibiotics as prescribed. Your infections have responded very nicely to them.  The ear canal and ear drum are now normal. We are putting you on oral steroids to help with the hearing loss in the right ear, and we have put in a referral to the ENT for further evaluation. (You can cancel that appointment if your hearing resolves).

## 2023-05-03 NOTE — Telephone Encounter (Signed)
   Chief Complaint: ear pain Symptoms: buzzing sound/can't hear   Disposition: [] ED /[] Urgent Care (no appt availability in office) / [x] Appointment(In office/virtual)/ []  [facility]/ [] Home Care/ [] Refused Recommended Disposition /[] [facility]/ []  Follow-up with PCP Additional Notes: Pt calling with complaints of ongoing right ear pain. Pt was seen on 4/9 and was told to return to office today if symptoms still present. Pt stated she can't hear out of ear and since Friday has a constant buzzing sound. No appts avail in office. RN  reached out to CAL and pt was scheduled for 1145 today. Due to transfer to office to schedule, no cared advice was given.               Copied from CRM [id number]. Topic: Clinical - Red Word Triage >> May 03, 2023  8:27 AM Allyne Areola wrote: Red Word that prompted transfer to Nurse Triage: Patient was seen on 04/28/2023 and diagnosed with an ear infection in the right ear. Patient symptoms have not improved she is still in pain and a new symptom developed during the weekend ringing in the ear. Reason for Disposition  [1] Taking antibiotic > 72 hours (3 days) and [2] pain persists or recurs  Answer Assessment - Initial Assessment Questions 1. ANTIBIOTIC: "What antibiotic are you taking?" "How many times per day?"     biaxin 2. ONSET: "When was the antibiotic started?"     04/28/23 3. LOCATION: "Which ear is involved?"     right 4. PAIN: "How bad is the pain?"   (Scale 1-10; mild, moderate or severe)   - MILD (1-3): doesn't interfere with normal activities    - MODERATE (4-7): interferes with normal activities or awakens from sleep    - SEVERE (8-10): excruciating pain, unable to do any normal activities      moderate 5. FEVER: "Do you have a fever?" If Yes, ask: "What is your temperature, how was it measured, and when did it start?"     na 6. DISCHARGE: "Is there any discharge from the ear?"     no 7. OTHER SYMPTOMS: "Do you have any  other symptoms?" (e.g., headache, stiff neck, dizziness, vomiting, runny nose)     Buzzing sound  Protocols used: Ear - Otitis Media Follow-up Call-A-AH

## 2023-05-05 NOTE — Progress Notes (Unsigned)
 Office Visit Note  Patient: April Cox             Date of Birth: 1955/04/17           MRN: [id number]             PCP: Watson Hacking, MD Referring: Watson Hacking, MD Visit Date: 05/13/2023 Occupation: @GUAROCC @  Subjective:  Medication monitoring   History of Present Illness: April Cox is a 68 y.o. female with history of psoriatic arthritis and osteoarthritis.  Patient reports that she has been off of Cosentyx  for about 5 months.  Patient states that Cosentyx  was no longer affordable with her current insurance.  Patient states that she has been off of methotrexate  for the past 1 month due to preparing to receive an updated COVID-19 vaccine.  In the process of waiting to get the vaccine she ended up developing severe right ear pain.  She was seen in urgent care on 04/23/2023 and has been followed by her PCP.  She has an upcoming appointment with ENT on 05/24/2023.  According to the patient she had a rupture of the eardrum and has had ongoing ringing in the right ear.  Patient states that she has completed the course of antibiotics as well as a Medrol  Dosepak earlier this week.  Patient is planning to receive the COVID 19 vaccine next week and then will resume methotrexate  the following week.  She denies any increased joint pain, joint swelling, joint stiffness while holding methotrexate .  She has not noticed any new or worsening symptoms since discontinuing Cosentyx .  She occasionally has a few small scattered patches of psoriasis for which she uses clobetasol  cream as needed. Patient states that she had an updated bone density earlier this year and states that she is now within the osteopenia range.  She will be following up with her endocrinologist in May 2025.  She has been taking calcium  and vitamin D  supplement daily.  She has been trying to go to the gym twice a week.    Activities of Daily Living:  Patient reports morning stiffness for 0 minutes.   Patient Denies nocturnal pain.   Difficulty dressing/grooming: Denies Difficulty climbing stairs: Denies Difficulty getting out of chair: Denies Difficulty using hands for taps, buttons, cutlery, and/or writing: Reports  Review of Systems  Constitutional:  Positive for fatigue.  HENT:  Positive for mouth dryness. Negative for mouth sores and nose dryness.   Eyes:  Positive for dryness. Negative for pain and visual disturbance.  Respiratory:  Negative for cough, hemoptysis, shortness of breath and difficulty breathing.   Cardiovascular:  Negative for chest pain, palpitations, hypertension and swelling in legs/feet.  Gastrointestinal:  Negative for blood in stool, constipation and diarrhea.  Endocrine: Negative for increased urination.  Genitourinary:  Negative for painful urination.  Musculoskeletal:  Positive for joint pain, joint pain and joint swelling. Negative for myalgias, muscle weakness, morning stiffness, muscle tenderness and myalgias.  Skin:  Negative for color change, pallor, rash, hair loss, nodules/bumps, skin tightness, ulcers and sensitivity to sunlight.  Allergic/Immunologic: Negative for susceptible to infections.  Neurological:  Negative for dizziness, numbness, headaches and weakness.  Hematological:  Negative for swollen glands.  Psychiatric/Behavioral:  Negative for depressed mood and sleep disturbance. The patient is not nervous/anxious.     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   Nonrheumatic aortic valve stenosis 08/12/2021   Sicca complex (  HCC) 04/01/2017   Psoriasis 04/01/2017   Dry eye 01/01/2017   Spondylosis of lumbar region without myelopathy or radiculopathy 01/07/2016   High risk medication use 01/07/2016   Essential hypertension 09/10/2014   History of HPV infection 09/10/2014   History of colonic polyps 09/10/2014   Osteoporosis 09/10/2014   Gastroesophageal reflux disease without esophagitis  09/10/2014   Cigarette smoker 05/11/2011   Hyperlipidemia 08/22/2010   Hypothyroidism 08/22/2010   Psoriatic arthritis (HCC) 08/22/2010    Past Medical History:  Diagnosis Date   Abnormal Pap smear of cervix    Allergy    seasonal   Anemia    history of anemia   Arthritis    Cataract    bilateral,removed   Diverticulosis of colon    Family history of adverse reaction to anesthesia    mother PONV   GERD (gastroesophageal reflux disease)    Heart murmur    asymptomatic per pt   History of adenomatous polyp of colon    11-06-2009  tubullar   HPV in female    Hyperlipidemia    Hypertension    Hypothyroidism    Irritable bladder    per patient   Mild aortic valve stenosis    per echo 10-09-2010  ef 60-65%  valva area 1.48cm   Osteoporosis    Psoriasis    Psoriatic arthritis (HCC)    Dr. Alvira Josephs   Wears glasses     Family History  Problem Relation Age of Onset   Colon polyps Mother    Arthritis Mother        OA    Psoriasis Mother    Other Father        accidental death/MVA at age 25   Hypertension Brother    Other Brother        1 brother drowned   Cancer Brother    Esophageal cancer Brother    Brain cancer Brother    Hypertension Maternal Grandmother    Diabetes Maternal Grandmother    Hypertension Maternal Grandfather    Heart disease Neg Hx    Colon cancer Neg Hx    Stomach cancer Neg Hx    Crohn's disease Neg Hx    Rectal cancer Neg Hx    Ulcerative colitis Neg Hx    Past Surgical History:  Procedure Laterality Date   ABDOMINAL HYSTERECTOMY     ANTERIOR LAT LUMBAR FUSION  12/25/2011   Procedure: ANTERIOR LATERAL LUMBAR FUSION 1 LEVEL;  Surgeon: Isadora Mar, MD;  Location: [facility];  Service: Neurosurgery;  Laterality: Left;  LUMBAR FOUR-FIVE   BLADDER SUSPENSION N/A 12/19/2014   Procedure: TRANSVAGINAL TAPE (TVT) PROCEDURE;  Surgeon: Renea Carrion, MD;  Location: [facility];  Service: Gynecology;  Laterality: N/A;   CARPAL TUNNEL RELEASE Right  04/2013   CATARACT EXTRACTION, BILATERAL     05/2020, 06/2020   CERVICAL CONIZATION W/BX N/A 08/28/2014   Procedure: CONIZATION CERVIX WITH BIOPSY;  Surgeon: Lula Sale, MD;  Location[facility];  Service: Gynecology;  Laterality: N/A;   COLONOSCOPY  11/06/2009   COLONOSCOPY  08/2019   COLONOSCOPY  10/2022   CYSTO N/A 12/19/2014   Procedure: CYSTO;  Surgeon: Renea Carrion, MD;  Location: [facility];  Service: Gynecology;  Laterality: N/A;   DENTAL SURGERY     HYSTEROSCOPY WITH D & C  04/04/2008   KNEE ARTHROPLASTY     LUMBAR PERCUTANEOUS PEDICLE SCREW 1 LEVEL  12/25/2011   Procedure: LUMBAR PERCUTANEOUS PEDICLE SCREW 1 LEVEL;  Surgeon: Isadora Mar, MD;  Location: [facility];  Service: Neurosurgery;  Laterality: Left;  LUMBAR FOUR-FIVE   PERONEAL NERVE DECOMPRESSION Left 12/27/2009   for neuropathy with  foot drop   POLYPECTOMY     TRANSTHORACIC ECHOCARDIOGRAM  10/09/2010   mild LVH,  ef 60-65%,  grade I diastolic dysfunction,  very mild AV stenosis (area 1.48cm/S/2(VTI) with no regurg./  mild MV calcification without stenosis or regur. /  trivial TR   VAGINAL HYSTERECTOMY Bilateral 12/19/2014   Procedure: TOTAL VAGINAL HYSTERECTOMY WITH BILATERAL SALPINGECTOMY;  Surgeon: Lula Sale, MD;  Location: [facility];  Service: Gynecology;  Laterality: Bilateral;   Social History   Social History Narrative   Not on file   Immunization History  Administered Date(s) Administered   Fluad Quad(high Dose 65+) 10/11/2018   Influenza Split 12/19/2010, 12/01/2011   Influenza, High Dose Seasonal PF 11/16/2020, 11/06/2021, 11/17/2022   Influenza,inj,Quad PF,6+ Mos 01/21/2016   Influenza,inj,quad, With Preservative 12/04/2017   Moderna Covid-19 Fall Seasonal Vaccine 2yrs & older 11/11/2021   PFIZER(Purple Top)SARS-COV-2 Vaccination 02/27/2019, 03/24/2019, 10/02/2019, 05/23/2020   Pfizer Covid-19 Vaccine Bivalent Booster 35yrs & up 10/23/2020   Pfizer(Comirnaty)Fall Seasonal  Vaccine 12 years and older 11/22/2022   Pneumococcal Conjugate-13 01/20/2014   Pneumococcal Polysaccharide-23 08/01/2010   Rsv, Bivalent, Protein Subunit Rsvpref,pf Pattricia Bores) 10/06/2021   Tdap 01/21/2016, 09/15/2017   Zoster Recombinant(Shingrix) 12/11/2017, 02/27/2018     Objective: Vital Signs: BP (!) 178/95 (BP Location: Right Arm, Patient Position: Sitting, Cuff Size: Normal)   Pulse 71   Resp 14   Wt 114 lb 12.8 oz (52.1 kg)   BMI 19.10 kg/m    Physical Exam Vitals and nursing note reviewed.  Constitutional:      Appearance: She is well-developed.  HENT:     Head: Normocephalic and atraumatic.  Eyes:     Conjunctiva/sclera: Conjunctivae normal.  Cardiovascular:     Rate and Rhythm: Normal rate and regular rhythm.     Heart sounds: Normal heart sounds.  Pulmonary:     Effort: Pulmonary effort is normal.     Breath sounds: Normal breath sounds.  Abdominal:     General: Bowel sounds are normal.     Palpations: Abdomen is soft.  Musculoskeletal:     Cervical back: Normal range of motion.  Lymphadenopathy:     Cervical: No cervical adenopathy.  Skin:    General: Skin is warm and dry.     Capillary Refill: Capillary refill takes less than 2 seconds.  Neurological:     Mental Status: She is alert and oriented to person, place, and time.  Psychiatric:        Behavior: Behavior normal.      Musculoskeletal Exam: C-spine has limited range of motion with lateral rotation.  No midline spinal tenderness.  No SI joint tenderness.  Shoulder joints have good range of motion with no discomfort.  Elbow joints have good range of motion with no tenderness along the joint line.  Severe PIP and DIP thickening.  No tenderness or active synovitis or dactylitis noted.  Hip joints have good range of motion with no groin pain.  Knee joints have good range of motion with no warmth or effusion.  Ankle joints have good range of motion with no tenderness or joint swelling.  No evidence of  Achilles tendinitis.  CDAI Exam: CDAI Score: -- Patient Global: --; Provider Global: -- Swollen: --; Tender: -- Joint Exam 05/13/2023   No joint exam has been documented for this  visit   There is currently no information documented on the homunculus. Go to the Rheumatology activity and complete the homunculus joint exam.  Investigation: No additional findings.  Imaging: No results found.  Recent Labs: Lab Results  Component Value Date   WBC 7.6 04/07/2023   HGB 12.2 04/07/2023   PLT 283 04/07/2023   NA 139 04/07/2023   K 4.6 04/07/2023   CL 104 04/07/2023   CO2 29 04/07/2023   GLUCOSE 93 04/07/2023   BUN 8 04/07/2023   CREATININE 0.64 04/07/2023   BILITOT 0.5 04/07/2023   ALKPHOS 89 09/08/2022   AST 16 04/07/2023   ALT 13 04/07/2023   PROT 6.7 04/07/2023   ALBUMIN  4.3 09/08/2022   CALCIUM  9.5 04/07/2023   GFRAA 113 04/02/2020   QFTBGOLD Negative 08/02/2015   QFTBGOLDPLUS NEGATIVE 06/10/2022    Speciality Comments: Prior therapy: Remicade  and Humira (inadequate response) Osteoporosis managed by PCP. Last DXA August 2019.  Procedures:  No procedures performed Allergies: Bee venom, Codeine, Other, Penicillins, and Gluten meal    Assessment / Plan:     Visit Diagnoses: Psoriatic arthritis (HCC): She has no synovitis or dactylitis on examination today.  No joint effusion noted.  No evidence of Achilles tendinitis or plantar fasciitis.  No active psoriasis at this time.  No SI joint tenderness upon palpation.  She has not had any signs or symptoms of a flare recently.  According to the patient she has been off of Cosentyx  for the past 5 months due to cost and issues with insurance coverage.  She has not noticed any new or worsening symptoms while off of Cosentyx .  She is prescribed methotrexate  1 mL subcu injections once weekly and folic acid  2 mg daily.  She has been off of methotrexate  for the past 1 month due to initially preparing to receive an updated COVID-vaccine  but then being diagnosed with a right ear infection.  She has completed the course of antibiotics and a Medrol  Dosepak and has a consultation scheduled with ENT on 05/24/2023.  Going to the patient her right TM ruptured and she has continued to experience ringing in the right ear.  Patient plans on resuming methotrexate  when she has been cleared by ENT as well as once she has had an updated COVID-19 vaccine.  She will notify us  if she develops any increased joint pain or joint swelling.  She would like to hold off on reinitiating Cosentyx  at this time.  She will follow-up in the office in 5 months or sooner if needed.  Psoriasis: No active psoriasis at this time.   High risk medication use -Methotrexate  1 mL sq injections every 7 days and folic acid  1 mg 2 tablets daily.  Inadequate response to remicade .  Patient discontinued cosentyx  5 months ago-due to cost/insurance coverage (initially started September 2019)  CBC and CMP updated on 04/07/23.  Her next lab work will be due in June and every 3 months.  TB gold negative on 06/10/22. Future order for TB gold placed today.  Discussed the importance of holding cosentyx  and methotrexate  if she develops signs or symptoms of an infection and to resume once the infection has completely cleared.  - Plan: QuantiFERON-TB Gold Plus  Screening for tuberculosis - Future order for TB gold placed today.  Plan: QuantiFERON-TB Gold Plus  Sicca complex (HCC) - Chronic, unchanged.  She has been using refresh eyedrops and Xiidra eyedrops for dry eyes  Primary osteoarthritis of both hands - Clinical findings are consistent with  psoriatic arthritis and osteoarthritis overlap with thickening and subluxation of several PIP and DIP joints.  No active inflammation noted.    Spondylosis of lumbar spine: Chronic pain.  No midline spinal tenderness.  No SI joint tenderness.  No symptoms of radiculopathy.   Age-related osteoporosis without current pathological fracture: DEXA  scan on 04/24/2021  T score was -3.8 in the left radius.  She completed Tymlos  prescribed by Dr. Robina Chol. First infusion of IV Reclast  in Dec 2023. Second IV Reclast  infusion administered on 12/29/22.   The patient has had an updated bone density ordered at the breast center but will be following up with endocrinology in May 2025.  According to the patient she is now within the osteopenic range.  She has been taking a calcium  and vitamin D  supplement daily.  Trochanteric bursitis of right hip: Not currently symptomatic.  No tenderness upon palpation today.   Other medical conditions are listed as follows:   History of gastroesophageal reflux (GERD)  History of hypertension: Blood pressure was significantly elevated today in the office but she has not yet taken her antihypertensive medications.  Patient was advised to take her medications as soon as she returns home and to monitor blood pressure closely.  If her blood pressure remains elevated she should follow-up with her PCP for further evaluation.  History of hypothyroidism  History of colonic polyps  Smoker    Orders: Orders Placed This Encounter  Procedures   QuantiFERON-TB Gold Plus   No orders of the defined types were placed in this encounter.   Follow-Up Instructions: Return in about 5 months (around 10/13/2023) for Psoriatic arthritis.   Romayne Clubs, PA-C  Note - This record has been created using Dragon software.  Chart creation errors have been sought, but may not always  have been located. Such creation errors do not reflect on  the standard of medical care.

## 2023-05-11 ENCOUNTER — Ambulatory Visit: Payer: Self-pay

## 2023-05-11 NOTE — Telephone Encounter (Signed)
 Copied from CRM [id number]. Topic: Clinical - Red Word Triage >> May 11, 2023  4:31 PM Shereese L wrote: [facility] that prompted transfer to Nurse Triage: right ear Ear stopped up and limited hearing Sharp shooting pain, Continuous ringing, Fluids drainage, off balance   Chief Complaint: Ear congestion  Symptoms: Right ear congestion, dull right ear pain  Frequency: Constant congestion, intermittent pain  Disposition: [] ED /[] Urgent Care (no appt availability in office) / [x] Appointment(In office/virtual)/ []  [facility]/ [] Home Care/ [] Refused Recommended Disposition /[] [facility]/ []  Follow-up with PCP Additional Notes: Patient calling in for right ear congestion and pain. Patient has been seen twice for the same and has an ENT appointment coming up next month, but would like to see if there is anything that can be done to help before that time. Appointment made for the patient on Thursday.     Reason for Disposition  Ear congestion present > 48 hours  Answer Assessment - Initial Assessment Questions 1. LOCATION: "Which ear is involved?"       Right ear  2. SENSATION: "Describe how the ear feels." (e.g. stuffy, full, plugged)."      Stopped up, unable to hear from the ear 3. ONSET:  "When did the ear symptoms start?"       2-3 weeks ago  4. PAIN: "Do you also have an earache?" If Yes, ask: "How bad is it?" (Scale 1-10; or mild, moderate, severe)     Dull ache, intermittent  5. CAUSE: "What do you think is causing the ear congestion?"     Recent ear infections 6. URI: "Do you have a runny nose or cough?"      No 7. NASAL ALLERGIES: "Are there symptoms of hay fever, such as sneezing or a clear nasal discharge?"     No  Protocols used: Ear - Congestion-A-AH

## 2023-05-13 ENCOUNTER — Ambulatory Visit: Admitting: Family Medicine

## 2023-05-13 ENCOUNTER — Ambulatory Visit: Attending: Physician Assistant | Admitting: Physician Assistant

## 2023-05-13 ENCOUNTER — Encounter: Payer: Self-pay | Admitting: Physician Assistant

## 2023-05-13 VITALS — BP 178/95 | HR 71 | Resp 14 | Wt 114.8 lb

## 2023-05-13 DIAGNOSIS — L409 Psoriasis, unspecified: Secondary | ICD-10-CM | POA: Diagnosis not present

## 2023-05-13 DIAGNOSIS — M7061 Trochanteric bursitis, right hip: Secondary | ICD-10-CM | POA: Diagnosis not present

## 2023-05-13 DIAGNOSIS — Z8639 Personal history of other endocrine, nutritional and metabolic disease: Secondary | ICD-10-CM | POA: Diagnosis not present

## 2023-05-13 DIAGNOSIS — L4050 Arthropathic psoriasis, unspecified: Secondary | ICD-10-CM | POA: Diagnosis not present

## 2023-05-13 DIAGNOSIS — M810 Age-related osteoporosis without current pathological fracture: Secondary | ICD-10-CM | POA: Diagnosis not present

## 2023-05-13 DIAGNOSIS — Z111 Encounter for screening for respiratory tuberculosis: Secondary | ICD-10-CM

## 2023-05-13 DIAGNOSIS — Z8679 Personal history of other diseases of the circulatory system: Secondary | ICD-10-CM

## 2023-05-13 DIAGNOSIS — Z79899 Other long term (current) drug therapy: Secondary | ICD-10-CM | POA: Diagnosis not present

## 2023-05-13 DIAGNOSIS — M19041 Primary osteoarthritis, right hand: Secondary | ICD-10-CM | POA: Diagnosis not present

## 2023-05-13 DIAGNOSIS — M51369 Other intervertebral disc degeneration, lumbar region without mention of lumbar back pain or lower extremity pain: Secondary | ICD-10-CM

## 2023-05-13 DIAGNOSIS — M19042 Primary osteoarthritis, left hand: Secondary | ICD-10-CM

## 2023-05-13 DIAGNOSIS — Z8719 Personal history of other diseases of the digestive system: Secondary | ICD-10-CM | POA: Diagnosis not present

## 2023-05-13 DIAGNOSIS — M47816 Spondylosis without myelopathy or radiculopathy, lumbar region: Secondary | ICD-10-CM | POA: Diagnosis not present

## 2023-05-13 DIAGNOSIS — F17200 Nicotine dependence, unspecified, uncomplicated: Secondary | ICD-10-CM

## 2023-05-13 DIAGNOSIS — Z860100 Personal history of colon polyps, unspecified: Secondary | ICD-10-CM | POA: Diagnosis not present

## 2023-05-13 DIAGNOSIS — M3500 Sicca syndrome, unspecified: Secondary | ICD-10-CM | POA: Diagnosis not present

## 2023-05-13 NOTE — Patient Instructions (Addendum)
Standing Labs We placed an order today for your standing lab work.   Please have your standing labs drawn in mid-June and every 3 months   Please have your labs drawn 2 weeks prior to your appointment so that the provider can discuss your lab results at your appointment, if possible.  Please note that you may see your imaging and lab results in MyChart before we have reviewed them. We will contact you once all results are reviewed. Please allow our office up to 72 hours to thoroughly review all of the results before contacting the office for clarification of your results.  WALK-IN LAB HOURS  Monday through Thursday from 8:00 am -12:30 pm and 1:00 pm-5:00 pm and Friday from 8:00 am-12:00 pm.  Patients with office visits requiring labs will be seen before walk-in labs.  You may encounter longer than normal wait times. Please allow additional time. Wait times may be shorter on  Monday and Thursday afternoons.  We do not book appointments for walk-in labs. We appreciate your patience and understanding with our staff.   Labs are drawn by [facility]. Please bring your co-pay at the time of your lab draw.  You may receive a bill from [facility] for your lab work.  Please note if you are on Hydroxychloroquine and and an order has been placed for a Hydroxychloroquine level,  you will need to have it drawn 4 hours or more after your last dose.  If you wish to have your labs drawn at another location, please call the office 24 hours in advance so we can fax the orders.  The office is located at [address], [place], [place] [address]   If you have any questions regarding directions or hours of operation,  please call [phone].   As a reminder, please drink plenty of water prior to coming for your lab work. Thanks!

## 2023-05-24 DIAGNOSIS — H903 Sensorineural hearing loss, bilateral: Secondary | ICD-10-CM | POA: Diagnosis not present

## 2023-05-24 DIAGNOSIS — H9313 Tinnitus, bilateral: Secondary | ICD-10-CM | POA: Diagnosis not present

## 2023-05-27 ENCOUNTER — Telehealth: Payer: Self-pay

## 2023-05-27 DIAGNOSIS — Z289 Immunization not carried out for unspecified reason: Secondary | ICD-10-CM

## 2023-05-27 DIAGNOSIS — E785 Hyperlipidemia, unspecified: Secondary | ICD-10-CM

## 2023-05-27 NOTE — Telephone Encounter (Signed)
 Copied from CRM [id number]. Topic: Clinical - Request for Lab/Test Order >> May 26, 2023  1:02 PM Yolanda T wrote: Reason for CRM: patient called request to have labs done. Patient said she needs a full panel and a pneumonia shot. Please f/u with appt

## 2023-05-28 ENCOUNTER — Other Ambulatory Visit (INDEPENDENT_AMBULATORY_CARE_PROVIDER_SITE_OTHER)

## 2023-05-28 DIAGNOSIS — E785 Hyperlipidemia, unspecified: Secondary | ICD-10-CM | POA: Diagnosis not present

## 2023-05-28 DIAGNOSIS — Z23 Encounter for immunization: Secondary | ICD-10-CM

## 2023-05-28 NOTE — Telephone Encounter (Signed)
 Came in today

## 2023-05-29 ENCOUNTER — Encounter: Payer: Self-pay | Admitting: Family Medicine

## 2023-05-29 LAB — LIPID PANEL
Chol/HDL Ratio: 4.3 ratio (ref 0.0–4.4)
Cholesterol, Total: 230 mg/dL — ABNORMAL HIGH (ref 100–199)
HDL: 53 mg/dL (ref >39)
LDL Chol Calc (NIH): 156 mg/dL — ABNORMAL HIGH (ref 0–99)
Triglycerides: 117 mg/dL (ref 0–149)
VLDL Cholesterol Cal: 21 mg/dL (ref 5–40)

## 2023-06-04 DIAGNOSIS — H04123 Dry eye syndrome of bilateral lacrimal glands: Secondary | ICD-10-CM | POA: Diagnosis not present

## 2023-06-04 DIAGNOSIS — H524 Presbyopia: Secondary | ICD-10-CM | POA: Diagnosis not present

## 2023-06-04 DIAGNOSIS — H353132 Nonexudative age-related macular degeneration, bilateral, intermediate dry stage: Secondary | ICD-10-CM | POA: Diagnosis not present

## 2023-06-04 DIAGNOSIS — H26493 Other secondary cataract, bilateral: Secondary | ICD-10-CM | POA: Diagnosis not present

## 2023-06-04 DIAGNOSIS — H16223 Keratoconjunctivitis sicca, not specified as Sjogren's, bilateral: Secondary | ICD-10-CM | POA: Diagnosis not present

## 2023-06-09 DIAGNOSIS — E039 Hypothyroidism, unspecified: Secondary | ICD-10-CM | POA: Diagnosis not present

## 2023-06-09 DIAGNOSIS — L4050 Arthropathic psoriasis, unspecified: Secondary | ICD-10-CM | POA: Diagnosis not present

## 2023-06-09 DIAGNOSIS — M810 Age-related osteoporosis without current pathological fracture: Secondary | ICD-10-CM | POA: Diagnosis not present

## 2023-06-09 DIAGNOSIS — I10 Essential (primary) hypertension: Secondary | ICD-10-CM | POA: Diagnosis not present

## 2023-06-09 DIAGNOSIS — Z720 Tobacco use: Secondary | ICD-10-CM | POA: Diagnosis not present

## 2023-06-09 DIAGNOSIS — K219 Gastro-esophageal reflux disease without esophagitis: Secondary | ICD-10-CM | POA: Diagnosis not present

## 2023-06-09 DIAGNOSIS — E559 Vitamin D deficiency, unspecified: Secondary | ICD-10-CM | POA: Diagnosis not present

## 2023-07-26 ENCOUNTER — Ambulatory Visit: Admitting: Family Medicine

## 2023-07-26 VITALS — BP 116/80 | HR 68 | Temp 97.4°F | Wt 110.8 lb

## 2023-07-26 DIAGNOSIS — L568 Other specified acute skin changes due to ultraviolet radiation: Secondary | ICD-10-CM | POA: Diagnosis not present

## 2023-07-26 DIAGNOSIS — Z5181 Encounter for therapeutic drug level monitoring: Secondary | ICD-10-CM | POA: Diagnosis not present

## 2023-07-26 NOTE — Progress Notes (Signed)
 Chief Complaint  Patient presents with   Acute Visit    Left side skin irritation. Skin irritation coming out everywhere. Been at the beach [place] 2 weeks. Been in sun with tons of sunscreen. Marks started bright red, now more purplish color. They do itch.    She has been at the beach for 2 weeks, using sunscreen religiously. 7/2 she went out in the sun, and later that day her daughter noticed a very bright red area on the left lateral arm.  It never was painful, didn't blister, she wasn't really aware of it until someone else pointed it out. It is somewhat splotchy. It has gotten darker in color (more purple), but not changed in size/location on the left arm. She stayed inside on Thursday. She noticed a similar area on her right inner thigh on the morning of 7/3.   Friday morning she noticed two other smaller spots on the upper right inner thigh. Friday, she wore long sleeves SPF shirt, and stayed in the shade (on the boat, and on the beach). She noticed a small spot on the right upper arm was itchy (mild, didn't notice any rash at the time), and today she noticed some discoloration where it had been itchy. Today everything seems a little itchy (even her chest).  She had been using spray sunscreen--switched to the cream after the skin discoloration rash had started.  She used benadryl  cream to the left arm just one day (Saturday), due to it feeling itchy, just once or twice.  She had been off of methotrexate  for a few months (related to ear infection, vaccines, etc). She has been back on it just for a couple of weeks. Last dose of methotrexate  was on Sunday.   PMH, PSH, SH reviewed  Last seen by me in April with R hearing loss. This had resolved by the time she saw ENT. Did undergo audiology eval and has mild bilateral hearing loss.  Declined hearing aids. HTN, HLD, hypothyroidism, psoriasis Smoker  Outpatient Encounter Medications as of 07/26/2023  Medication Sig Note   atorvastatin   (LIPITOR) 40 MG tablet Take 1 tablet (40 mg total) by mouth daily.    calcium -vitamin D  (OSCAL WITH D) 500-200 MG-UNIT per tablet Take 1 tablet by mouth daily.    Cholecalciferol (VITAMIN D  PO) Take 500 mg by mouth daily.     CRANBERRY PO Take by mouth daily.    EPINEPHrine  0.3 mg/0.3 mL IJ SOAJ injection INJECT INTO THE MIDDLE OF THE OUTER THIGH AND HOLD FOR 10 SECONDS AS NEEDED FOR SEVERE ALLERGIC REACTION THEN CALL 911 IF USED    Estradiol  10 MCG TABS vaginal tablet Place vaginally.    folic acid  (FOLVITE ) 1 MG tablet TAKE 2 TABLETS BY MOUTH DAILY    levothyroxine  (SYNTHROID ) 25 MCG tablet TAKE 1 TABLET BY MOUTH DAILY BEFORE BREAKFAST    methotrexate  50 MG/2ML injection INJECT 1 MILLILITER (CC) UNDER THE SKIN ONCE WEEKLY    metoprolol  succinate (TOPROL -XL) 50 MG 24 hr tablet TAKE 1 TABLET BY MOUTH DAILY IMMEDIATELY FOLLOWING A MEAL    MONOJECT 1CC TB SYR 27GX1/2 27G X 1/2 1 ML MISC USE AS DIRECTED    Multiple Vitamins-Minerals (PRESERVISION AREDS PO) Take by mouth daily. Take one daily    pantoprazole  (PROTONIX ) 40 MG tablet Take 1 tablet (40 mg total) by mouth daily.    Polyvinyl Alcohol-Povidone (REFRESH OP) Apply to eye as needed.    vitamin C (ASCORBIC ACID) 250 MG tablet Take 250 mg by mouth daily.  clarithromycin  (BIAXIN ) 500 MG tablet Take 1 tablet (500 mg total) by mouth 2 (two) times daily. (Patient not taking: Reported on 07/26/2023)    methylPREDNISolone  (MEDROL  DOSEPAK) 4 MG TBPK tablet Take with food as directed (Patient not taking: Reported on 05/13/2023)    Secukinumab , 300 MG Dose, (COSENTYX  SENSOREADY, 300 MG,) 150 MG/ML SOAJ INJECT 1 PEN UNDER THE SKIN (SUBCUTANEOUS INJECTION) EVERY 14 DAYS (Patient not taking: Reported on 07/26/2023) 05/03/2023: On hold due to insurance   XIIDRA 5 % SOLN 2 (two) times daily.    Zoledronic  Acid (RECLAST  IV) Inject into the vein. First IV: 12/2021, most recent IV: 12/2022 (Patient not taking: Reported on 07/26/2023)    No facility-administered  encounter medications on file as of 07/26/2023.   Allergies  Allergen Reactions   Bee Venom Anaphylaxis   Codeine Hives and Nausea And Vomiting    REACTION: severe    Other Anaphylaxis    Bee stings   Penicillins Shortness Of Breath, Swelling and Rash   Gluten Meal Swelling and Other (See Comments)    Inflammation  (this a trigger due to Psoriatic arthritis    ROS: No fever, chills, no bleeding. No changes in easy bruising. She has some flaring of her psoriatic arthritis in her hand joints. Denies any skin flares of psoriasis. Moods are good. No GI complaints or other concerns. See HPI   PHYSICAL EXAM:  BP 116/80   Pulse 68   Temp (!) 97.4 F (36.3 C)   Wt 110 lb 12.8 oz (50.3 kg)   SpO2 98%   BMI 18.44 kg/m   Pleasant, thin, very tanned female, in no distress HEENT: conjunctiva and sclera are clear, EOMI Neck: no lymphadenopathy or mass Heart: regular rate and rhythm, murmur noted Lungs: clear bilaterally Skin: see photo in media L arm--38 cm in length (extending from just below the L shoulder down to the 2/3 down the forearm, laterally), and 3-4 cm at most in width. Erythematous, slightly purple.  Mostly macular, slightly raised only at the elbow. Purpuric lesion noted at the distal L forearm/wrist, separate from this.  R upper medial arm--vague area (4x4 cm) of hyperpigmentation, slightly pink. Macular There are 3 separate areas on the right medial thigh. Distal-most area is somehwat more splotchy, and darker than the other 2 areas, measuring 10 x 4 cm Upper right medial thigh--9 x 3 cm area of erythema, more uniform (slightly less splotchy than the lower lesion). All of these blanch easily.  5 x 2 cm area at the right medial thigh--between the other 2 erythematou areas-- This is very pale in comparison, barely pink.  Apparently it looked brighter earlier, and is improving.  Extremities: no edema Neuro: alert and oriented, cranial nerves intact, normal gait Psych:  normal mood, affect, hygiene and grooming    ASSESSMENT/PLAN:  Photosensitization due to sun - suspect photosensitivity reaction d/t methotrexate . Poss poor sunscreen coverage with reapplication of spray outside. Supportive measures - Plan: CBC with Differential/Platelet  Medication monitoring encounter - given she recently restarted methotrexate , and some purpura, will recheck CBC - Plan: CBC with Differential/Platelet  We are checking labs to ensure that your blood counts are normal (concern for low platelets). If everything is okay, I suspect this is a possible sun sensitivity reaction related to the methotrexate . Try and use sunscreen creams, rather than the sprays (which don't cover the areas as uniformly, especially when outside with a breeze).  If you notice ongoing issues with spread, or changes, please get re-evaluated.  I spent 33 minutes dedicated to the care of this patient, including pre-visit review of records, face to face time, post-visit ordering of testing and documentation.

## 2023-07-26 NOTE — Patient Instructions (Signed)
  We are checking labs to ensure that your blood counts are normal (concern for low platelets). If everything is okay, I suspect this is a possible sun sensitivity reaction related to the methotrexate . Try and use sunscreen creams, rather than the sprays (which don't cover the areas as uniformly, especially when outside with a breeze).  If you notice ongoing issues with spread, or changes, please get re-evaluated.

## 2023-07-27 ENCOUNTER — Ambulatory Visit: Payer: Self-pay | Admitting: Family Medicine

## 2023-07-27 LAB — CBC WITH DIFFERENTIAL/PLATELET
Basophils Absolute: 0 x10E3/uL (ref 0.0–0.2)
Basos: 0 %
EOS (ABSOLUTE): 0 x10E3/uL (ref 0.0–0.4)
Eos: 1 %
Hematocrit: 37 % (ref 34.0–46.6)
Hemoglobin: 11.7 g/dL (ref 11.1–15.9)
Immature Grans (Abs): 0 x10E3/uL (ref 0.0–0.1)
Immature Granulocytes: 0 %
Lymphocytes Absolute: 2.4 x10E3/uL (ref 0.7–3.1)
Lymphs: 40 %
MCH: 30.6 pg (ref 26.6–33.0)
MCHC: 31.6 g/dL (ref 31.5–35.7)
MCV: 97 fL (ref 79–97)
Monocytes Absolute: 0.2 x10E3/uL (ref 0.1–0.9)
Monocytes: 3 %
Neutrophils Absolute: 3.4 x10E3/uL (ref 1.4–7.0)
Neutrophils: 56 %
Platelets: 284 x10E3/uL (ref 150–450)
RBC: 3.82 x10E6/uL (ref 3.77–5.28)
RDW: 13 % (ref 11.7–15.4)
WBC: 6.1 x10E3/uL (ref 3.4–10.8)

## 2023-08-23 DIAGNOSIS — R300 Dysuria: Secondary | ICD-10-CM | POA: Diagnosis not present

## 2023-09-03 DIAGNOSIS — R300 Dysuria: Secondary | ICD-10-CM | POA: Diagnosis not present

## 2023-09-06 ENCOUNTER — Ambulatory Visit: Payer: Self-pay

## 2023-09-06 NOTE — Telephone Encounter (Signed)
 Call dropped prior to scheduling. Attempted to call back but the line was busy. Unable to LVM. Routing for further attempts. Patient has been triaged and just needs to be scheduled.       Copied from CRM #[id number]. Topic: Clinical - Red Word Triage >> Sep 06, 2023 11:41 AM Willma SAUNDERS wrote: [facility] that prompted transfer to Nurse Triage: Patient has been sick for the last few weeks. Went through 2 antibiotics for a UTI, just getting over a cold. She is now feeling lethargic, has no energy, constant fatigue, and has been unable to eat.     Reason for Disposition  [1] Fatigue (i.e., tires easily, decreased energy) AND [2] persists > 1 week  Answer Assessment - Initial Assessment Questions 1. DESCRIPTION: Describe how you are feeling.     I have no energy 2. SEVERITY: How bad is it?  Can you stand and walk?     Mild to moderate  3. ONSET: When did these symptoms begin? (e.g., hours, days, weeks, months)     At least a week 4. CAUSE: What do you think is causing the weakness or fatigue? (e.g., not drinking enough fluids, medical problem, trouble sleeping)     Has been sick recently  5. NEW MEDICINES:  Have you started on any new medicines recently? (e.g., opioid pain medicines, benzodiazepines, muscle relaxants, antidepressants, antihistamines, neuroleptics, beta blockers)     Recently treated with antibiotics for a UTI 6. OTHER SYMPTOMS: Do you have any other symptoms? (e.g., chest pain, fever, cough, SOB, vomiting, diarrhea, bleeding, other areas of pain)     Loss of appetite  Protocols used: Weakness (Generalized) and Fatigue-A-AH

## 2023-09-06 NOTE — Telephone Encounter (Signed)
 This RN scheduled appt for patient. Offered patient today's appt with another provider, patient declined and reports okay for appt with her pcp tomorrow.   Educated patient to continue to increase fluids for hydration and call office/UC if symptoms worsen

## 2023-09-07 ENCOUNTER — Encounter: Payer: Self-pay | Admitting: Family Medicine

## 2023-09-07 ENCOUNTER — Ambulatory Visit (INDEPENDENT_AMBULATORY_CARE_PROVIDER_SITE_OTHER): Admitting: Family Medicine

## 2023-09-07 VITALS — BP 122/78 | HR 76 | Temp 97.9°F | Ht 65.0 in | Wt 99.6 lb

## 2023-09-07 DIAGNOSIS — N3000 Acute cystitis without hematuria: Secondary | ICD-10-CM | POA: Diagnosis not present

## 2023-09-07 DIAGNOSIS — J069 Acute upper respiratory infection, unspecified: Secondary | ICD-10-CM | POA: Diagnosis not present

## 2023-09-07 MED ORDER — SULFAMETHOXAZOLE-TRIMETHOPRIM 800-160 MG PO TABS: 1.0000 | ORAL_TABLET | Freq: Two times a day (BID) | ORAL | 0 refills | Status: DC

## 2023-09-07 NOTE — Progress Notes (Signed)
   Subjective:    Patient ID: April Cox, female    DOB: April 28, 1955, 68 y.o.   MRN: [id number]  Discussed the use of AI scribe software for clinical note transcription with the patient, who gave verbal consent to proceed.  History of Present Illness   April Cox is a 68 year old female with irritable bladder and Sjogren's syndrome who presents with recurrent urinary tract infection and upper respiratory symptoms.  Two weeks ago, she experienced a urinary tract infection (UTI) treated by her OB GYN due to her history of irritable bladder. After completing the prescribed medication, she initially felt better, but symptoms of pressure and burning during urination returned two to three days ago. She has not received additional treatment since the recurrence of symptoms. Her UTI symptoms have subsided except for dark urine, and she reports maintaining fluid consumption. She also notes increased dry mouth and lips, more than her usual experience with Sjogren's syndrome.  In addition to the UTI, she developed a cold towards the end of the first week after the initial UTI symptoms. She describes symptoms of nasal and chest congestion, a cough, and a fever that reached 102F. These symptoms began approximately a week ago. She also reports fatigue and a headache that started around the same time as the cold symptoms. The headache is described as being across the forehead, not typical for her as she rarely experiences headaches.  She mentions a significant decrease in appetite over the past week, stating she 'can't eat anything' and experiences gagging when trying to eat. Initially, she could manage small amounts of food, but in recent days, her intake has decreased further, contributing to weight loss. She has been taking Tylenol  for the headache, using a smaller dose than recommended, which provides some relief.  She reports dizziness, particularly when looking down, and a decrease in urinary  frequency despite adequate fluid intake.           Review of Systems     Objective:    Physical Exam Physical Exam   HEENT: Ears normal, eardrums and ear canals normal. Mouth normal, tongue slightly white. NECK: Neck supple, no swelling. CHEST: Lungs clear to auscultation. CARDIOVASCULAR: Systolic murmur present.            Assessment & Plan:  Assessment and Plan    Urinary tract infection in the setting of overactive bladder Recurrent UTI with pressure and burning. Symptoms resolved with treatment but recurred. Urine dark, possibly due to low fluid intake or infection. Differential includes irritable bladder vs. persistent infection. - Prescribed Septra , one tablet twice daily for ten days. - Perform urine culture to confirm bacterial infection. - Consider urologist referral if symptoms persist.  Acute upper respiratory infection Acute URI with nasal and chest congestion, cough, fever. Negative COVID tests. Viral etiology, expected to resolve naturally. - Advise increased fluid intake. - Encourage eating as tolerated. - Educated on typical 7-day resolution of colds.  Fatigue, anorexia, and headache associated with acute illness Symptoms likely secondary to acute URI and UTI. Headache frontal, partially relieved by Tylenol . Anorexia contributes to fatigue. - Continue Tylenol  as needed for headache. - Encourage fluid intake and eating as tolerated.  Dizziness Dizziness when looking down, possibly due to dehydration or acute illness. - Further evaluation if symptoms persist.

## 2023-09-09 ENCOUNTER — Ambulatory Visit: Payer: Self-pay | Admitting: Family Medicine

## 2023-09-09 ENCOUNTER — Ambulatory Visit: Payer: Self-pay

## 2023-09-09 LAB — URINE CULTURE

## 2023-09-09 NOTE — Telephone Encounter (Signed)
 FYI Only or Action Required?: FYI only for provider.  Patient was last seen in primary care on 09/07/2023 by April Norleen BROCKS, MD.  Called Nurse Triage reporting Dizziness; Headache; Nasal Congestion; chest congestion; prior cold/pale/clammy skin; prior too weak to stand; Diarrhea; Chills; prior fever; lost 8 lbs in the past 2 weeks; last ate food 2 days ago, no appetite, gagging to any food; and Nausea.  Symptoms began several weeks ago.  Interventions attempted: Prescription medications: multiple rounds antibx, Rest, hydration, or home remedies, and Dietary changes.  Symptoms are: rapidly worsening.  Triage Disposition: Go to ED Now (or PCP Triage)  Patient/caregiver understands and will follow disposition?: Yes      Copied from CRM [id number]. Topic: Clinical - Red Word Triage >> Sep 09, 2023  3:34 PM April Cox wrote: [facility] that prompted transfer to Nurse Triage: Patient states that she had a reoccurring bladder and respiratory infection come back. She spoke with Dr. Joyce and received antibiotics but they are not helping. She is still feeling dizzyness, headaches and no appetite so she's not eating.   Reason for Disposition  Patient sounds very sick or weak to the triager  Answer Assessment - Initial Assessment Questions 1. DESCRIPTION: Describe your dizziness.     Have felt couple times like about to pass out when got dizzy When walk have to be very careful because find self wobbling Don't know if because not been able to eat for a week Headache and dizziness come and go If stand up dizzy 5. ONSET:  When did the dizziness begin?     Dizziness over a week ago Headache nausea all started over a week ago Was sick, been going on 3 weeks 8. CAUSE: What do you think is causing the dizziness? (e.g., decreased fluids or food, diarrhea, emotional distress, heat exposure, new medicine, sudden standing, vomiting; unknown)     Don't know if from not eating anything 10. OTHER  SYMPTOMS: Do you have any other symptoms? (e.g., fever, chest pain, vomiting, diarrhea, bleeding)       Chest and nasal congestion improved still around, nasal congestion feels improved to the level allergies rather than cold Congestion in lungs is way cleared up Still cough up some things but not like I was white-clear sputum No SOB No more urinary symptoms Still on antibx, taken 3 days so far of 10 day course No chest pain Can't eat anything Drinking water, sprite, cranberry juice Nausea keeping her from eating If put food in mouth start gagging, just getting it in my mouth Some diarrhea but attributed to nothing on stomach but liquid Last time able to eat something 2 days ago maybe, took an ensure and put ice cream in it to make a milkshake, able to drink little bit of it was on antibx for UTI, started losing appetite, fever with UTI, just don't feel good, didn't feel like eating a lot but could eat some, finished antibx, UTI came back, got antibx again, UTI seemed to clear up again but still having the headache and all and really couldn't eat anything Weight loss lost 8 lbs in last 2 weeks Confirms have felt cold/pale/clammy skin and too weak to stand but not right now Earlier had to sit under blanket because having chills, temp normal as far as I know, not taken it No heart racing  Will probably go to [employer], gotta track husband down first he's at work    Advised pt go to ED, advised call 911  if felt too weak to stand or cold/pale/clammy skin again or feel any worsening or new symptoms. Pt states she will contact her husband and likely head to [facility].  Protocols used: Dizziness - Lightheadedness-A-AH, Weight Loss - Unintended-A-AH

## 2023-09-10 ENCOUNTER — Emergency Department (HOSPITAL_COMMUNITY)
Admission: EM | Admit: 2023-09-10 | Discharge: 2023-09-10 | Disposition: A | Discharge status: Home / Self Care | Source: Ambulatory Visit | Attending: Emergency Medicine | Admitting: Emergency Medicine

## 2023-09-10 ENCOUNTER — Emergency Department (HOSPITAL_COMMUNITY)

## 2023-09-10 ENCOUNTER — Other Ambulatory Visit: Payer: Self-pay

## 2023-09-10 DIAGNOSIS — R42 Dizziness and giddiness: Secondary | ICD-10-CM | POA: Diagnosis not present

## 2023-09-10 DIAGNOSIS — E871 Hypo-osmolality and hyponatremia: Secondary | ICD-10-CM | POA: Insufficient documentation

## 2023-09-10 DIAGNOSIS — E039 Hypothyroidism, unspecified: Secondary | ICD-10-CM | POA: Insufficient documentation

## 2023-09-10 DIAGNOSIS — E876 Hypokalemia: Secondary | ICD-10-CM | POA: Insufficient documentation

## 2023-09-10 DIAGNOSIS — N390 Urinary tract infection, site not specified: Secondary | ICD-10-CM | POA: Diagnosis not present

## 2023-09-10 DIAGNOSIS — Z79899 Other long term (current) drug therapy: Secondary | ICD-10-CM | POA: Diagnosis not present

## 2023-09-10 DIAGNOSIS — I10 Essential (primary) hypertension: Secondary | ICD-10-CM | POA: Insufficient documentation

## 2023-09-10 DIAGNOSIS — D72829 Elevated white blood cell count, unspecified: Secondary | ICD-10-CM | POA: Diagnosis not present

## 2023-09-10 DIAGNOSIS — E860 Dehydration: Secondary | ICD-10-CM | POA: Diagnosis not present

## 2023-09-10 DIAGNOSIS — Z981 Arthrodesis status: Secondary | ICD-10-CM | POA: Diagnosis not present

## 2023-09-10 DIAGNOSIS — R519 Headache, unspecified: Secondary | ICD-10-CM | POA: Insufficient documentation

## 2023-09-10 LAB — COMPREHENSIVE METABOLIC PANEL WITH GFR
ALT: 12 U/L (ref 0–44)
AST: 18 U/L (ref 15–41)
Albumin: 3.4 g/dL — ABNORMAL LOW (ref 3.5–5.0)
Alkaline Phosphatase: 84 U/L (ref 38–126)
Anion gap: 11 (ref 5–15)
BUN: 6 mg/dL — ABNORMAL LOW (ref 8–23)
CO2: 27 mmol/L (ref 22–32)
Calcium: 9.5 mg/dL (ref 8.9–10.3)
Chloride: 94 mmol/L — ABNORMAL LOW (ref 98–111)
Creatinine, Ser: 0.77 mg/dL (ref 0.44–1.00)
GFR, Estimated: >60 mL/min (ref >60)
Glucose, Bld: 111 mg/dL — ABNORMAL HIGH (ref 70–99)
Potassium: 2.5 mmol/L — CL (ref 3.5–5.1)
Sodium: 132 mmol/L — ABNORMAL LOW (ref 135–145)
Total Bilirubin: 0.8 mg/dL (ref 0.0–1.2)
Total Protein: 7 g/dL (ref 6.5–8.1)

## 2023-09-10 LAB — BASIC METABOLIC PANEL WITH GFR
Anion gap: 9 (ref 5–15)
BUN: <5 mg/dL — ABNORMAL LOW (ref 8–23)
CO2: 26 mmol/L (ref 22–32)
Calcium: 9.1 mg/dL (ref 8.9–10.3)
Chloride: 98 mmol/L (ref 98–111)
Creatinine, Ser: 0.62 mg/dL (ref 0.44–1.00)
GFR, Estimated: >60 mL/min (ref >60)
Glucose, Bld: 111 mg/dL — ABNORMAL HIGH (ref 70–99)
Potassium: 3.3 mmol/L — ABNORMAL LOW (ref 3.5–5.1)
Sodium: 133 mmol/L — ABNORMAL LOW (ref 135–145)

## 2023-09-10 LAB — CBC WITH DIFFERENTIAL/PLATELET
Abs Immature Granulocytes: 0.07 K/uL (ref 0.00–0.07)
Basophils Absolute: 0 K/uL (ref 0.0–0.1)
Basophils Relative: 0 %
Eosinophils Absolute: 0.2 K/uL (ref 0.0–0.5)
Eosinophils Relative: 1 %
HCT: 33.8 % — ABNORMAL LOW (ref 36.0–46.0)
Hemoglobin: 11.6 g/dL — ABNORMAL LOW (ref 12.0–15.0)
Immature Granulocytes: 1 %
Lymphocytes Relative: 16 %
Lymphs Abs: 2.1 K/uL (ref 0.7–4.0)
MCH: 30.1 pg (ref 26.0–34.0)
MCHC: 34.3 g/dL (ref 30.0–36.0)
MCV: 87.6 fL (ref 80.0–100.0)
Monocytes Absolute: 0.6 K/uL (ref 0.1–1.0)
Monocytes Relative: 5 %
Neutro Abs: 9.9 K/uL — ABNORMAL HIGH (ref 1.7–7.7)
Neutrophils Relative %: 77 %
Platelets: 557 K/uL — ABNORMAL HIGH (ref 150–400)
RBC: 3.86 MIL/uL — ABNORMAL LOW (ref 3.87–5.11)
RDW: 12.4 % (ref 11.5–15.5)
WBC: 12.8 K/uL — ABNORMAL HIGH (ref 4.0–10.5)
nRBC: 0 % (ref 0.0–0.2)

## 2023-09-10 LAB — URINALYSIS, ROUTINE W REFLEX MICROSCOPIC
Bilirubin Urine: NEGATIVE
Glucose, UA: NEGATIVE mg/dL
Hgb urine dipstick: NEGATIVE
Ketones, ur: NEGATIVE mg/dL
Nitrite: NEGATIVE
Protein, ur: >=300 mg/dL — AB
Specific Gravity, Urine: 1.013 (ref 1.005–1.030)
pH: 5 (ref 5.0–8.0)

## 2023-09-10 LAB — MAGNESIUM: Magnesium: 1.3 mg/dL — ABNORMAL LOW (ref 1.7–2.4)

## 2023-09-10 MED ORDER — POTASSIUM CHLORIDE 10 MEQ/100ML IV SOLN
10.0000 meq | INTRAVENOUS | Status: DC
  Administered 2023-09-10 (×3): 10 meq via INTRAVENOUS
  Filled 2023-09-10 (×3): qty 100

## 2023-09-10 MED ORDER — METOCLOPRAMIDE HCL 10 MG PO TABS
10.0000 mg | ORAL_TABLET | Freq: Once | ORAL | Status: AC
  Administered 2023-09-10: 10 mg via ORAL
  Filled 2023-09-10: qty 1

## 2023-09-10 MED ORDER — MAGNESIUM OXIDE -MG SUPPLEMENT 200 MG PO TABS: 1.0000 | ORAL_TABLET | Freq: Every day | ORAL | 0 refills | Status: AC

## 2023-09-10 MED ORDER — POTASSIUM CHLORIDE CRYS ER 20 MEQ PO TBCR
40.0000 meq | EXTENDED_RELEASE_TABLET | Freq: Once | ORAL | Status: AC
  Administered 2023-09-10: 40 meq via ORAL
  Filled 2023-09-10: qty 2

## 2023-09-10 MED ORDER — MAGNESIUM SULFATE IN D5W 1-5 GM/100ML-% IV SOLN
INTRAVENOUS | Status: AC
  Administered 2023-09-10: 1 g via INTRAVENOUS
  Filled 2023-09-10: qty 100

## 2023-09-10 MED ORDER — POTASSIUM CHLORIDE CRYS ER 20 MEQ PO TBCR: 20.0000 meq | EXTENDED_RELEASE_TABLET | Freq: Two times a day (BID) | ORAL | 0 refills | Status: DC

## 2023-09-10 MED ORDER — MAGNESIUM SULFATE IN D5W 1-5 GM/100ML-% IV SOLN
1.0000 g | Freq: Once | INTRAVENOUS | Status: AC
  Filled 2023-09-10: qty 100

## 2023-09-10 MED ORDER — LACTATED RINGERS IV BOLUS
1000.0000 mL | Freq: Once | INTRAVENOUS | Status: AC
  Administered 2023-09-10: 1000 mL via INTRAVENOUS

## 2023-09-10 NOTE — ED Provider Notes (Addendum)
 [place] EMERGENCY DEPARTMENT AT [facility] Provider Note   CSN: [id number] Arrival date & time: 09/10/23  9171     Patient presents with: Headache   April Cox is a 68 y.o. female.    Headache  Patient is a 68 year old female with a past medical history significant for HLD, hypothyroidism, psoriatic arthritis, HTN, reflux  Patient presents emergency room today with complaints of headache, fatigue, feeling lightheaded, having poor appetite for the past 5 days she has a history over the past 1 months of 2 UTIs and a URI.  She feels that her weakness is generalized.  She states that she is simply not felt energetic since her multiple illnesses.  She denies any unilateral weakness denies any blurry vision or double vision.  No slurred speech or changes.  No fevers at home.  She has not experienced any more dysuria or hematuria feels that she is improved from that standpoint.  She has not had any diarrhea or vomiting although she does have some mild nausea at times.  Denies any neck pain or stiffness.  No tick bites.  No history of cancer.      Prior to Admission medications   Medication Sig Start Date End Date Taking? Authorizing Provider  Magnesium  Oxide -Mg Supplement 200 MG TABS Take 1 tablet (200 mg total) by mouth daily for 3 days. 09/10/23 09/13/23 Yes Aziz Slape, Hamp RAMAN, PA  potassium chloride  SA (KLOR-CON  M) 20 MEQ tablet Take 1 tablet (20 mEq total) by mouth 2 (two) times daily for 5 days. 09/10/23 09/15/23 Yes Kristelle Cavallaro S, PA  atorvastatin  (LIPITOR) 40 MG tablet Take 1 tablet (40 mg total) by mouth daily. 04/28/23   [name], MD  calcium -vitamin D  (OSCAL WITH D) 500-200 MG-UNIT per tablet Take 1 tablet by mouth daily.    [provider]  Cholecalciferol (VITAMIN D  PO) Take 500 mg by mouth daily.     [provider]  CRANBERRY PO Take by mouth daily.    [provider]  EPINEPHrine  0.3 mg/0.3 mL IJ SOAJ injection INJECT  INTO THE MIDDLE OF THE OUTER THIGH AND HOLD FOR 10 SECONDS AS NEEDED FOR SEVERE ALLERGIC REACTION THEN CALL 911 IF USED 12/29/22   [name], MD  Estradiol  10 MCG TABS vaginal tablet Place vaginally. 05/19/22   [provider]  folic acid  (FOLVITE ) 1 MG tablet TAKE 2 TABLETS BY MOUTH DAILY 11/10/22   [name], MD  levothyroxine  (SYNTHROID ) 25 MCG tablet TAKE 1 TABLET BY MOUTH DAILY BEFORE BREAKFAST 04/28/23   [name], MD  methotrexate  50 MG/2ML injection INJECT 1 MILLILITER (CC) UNDER THE SKIN ONCE WEEKLY 04/08/23   Cheryl Waddell HERO, PA-C  metoprolol  succinate (TOPROL -XL) 50 MG 24 hr tablet TAKE 1 TABLET BY MOUTH DAILY IMMEDIATELY FOLLOWING A MEAL 04/28/23   [name], MD  MONOJECT 1CC TB SYR 27GX1/2 27G X 1/2 1 ML MISC USE AS DIRECTED 04/13/22   Cheryl Waddell HERO, PA-C  Multiple Vitamins-Minerals (PRESERVISION AREDS PO) Take by mouth daily. Take one daily    [provider]  pantoprazole  (PROTONIX ) 40 MG tablet Take 1 tablet (40 mg total) by mouth daily. 04/28/23   Lalonde, John C, MD  Polyvinyl Alcohol-Povidone (REFRESH OP) Apply to eye as needed.    [provider]  sulfamethoxazole -trimethoprim  (BACTRIM  DS) 800-160 MG tablet Take 1 tablet by mouth 2 (two) times daily. 09/07/23   Lalonde, John C, MD  vitamin C (ASCORBIC ACID) 250 MG  tablet Take 250 mg by mouth daily.    [provider]  Zoledronic  Acid (RECLAST  IV) Inject into the vein. First IV: 12/2021, most recent IV: 12/2022 Patient not taking: Reported on 09/07/2023    [provider]    Allergies: Bee venom, Codeine, Other, Penicillins, and Gluten meal    Review of Systems  Neurological:  Positive for headaches.    Updated Vital Signs BP 119/81 (BP Location: Right Arm)   Pulse 72   Temp 98.7 F (37.1 C)   Resp 16   SpO2 100%   Physical Exam Vitals and nursing note reviewed.  Constitutional:      General: She is not in acute distress. HENT:     Head: Normocephalic  and atraumatic.     Nose: Nose normal.     Mouth/Throat:     Mouth: Mucous membranes are dry.  Eyes:     General: No scleral icterus. Cardiovascular:     Rate and Rhythm: Normal rate and regular rhythm.     Pulses: Normal pulses.     Heart sounds: Normal heart sounds.  Pulmonary:     Effort: Pulmonary effort is normal. No respiratory distress.     Breath sounds: No wheezing.  Abdominal:     Palpations: Abdomen is soft.     Tenderness: There is no abdominal tenderness. There is no guarding or rebound.  Musculoskeletal:     Cervical back: Normal range of motion.     Right lower leg: No edema.     Left lower leg: No edema.  Skin:    General: Skin is warm and dry.     Capillary Refill: Capillary refill takes less than 2 seconds.  Neurological:     Mental Status: She is alert. Mental status is at baseline.     Comments: Alert and oriented to self, place, time and event.   Speech is fluent, clear without dysarthria or dysphasia.   Strength 5/5 in upper/lower extremities   Sensation intact in upper/lower extremities   Negative Romberg. No pronator drift.  Normal finger-to-nose and feet tapping.  CN I not tested  CN II grossly intact visual fields bilaterally. Did not visualize posterior eye.  CN III, IV, VI PERRLA and EOMs intact bilaterally  CN V Intact sensation to sharp and light touch to the face  CN VII facial movements symmetric  CN VIII not tested  CN IX, X no uvula deviation, symmetric rise of soft palate  CN XI 5/5 SCM and trapezius strength bilaterally  CN XII Midline tongue protrusion, symmetric L/R movements   Psychiatric:        Mood and Affect: Mood normal.        Behavior: Behavior normal.     (all labs ordered are listed, but only abnormal results are displayed) Labs Reviewed  COMPREHENSIVE METABOLIC PANEL WITH GFR - Abnormal; Notable for the following components:      Result Value   Sodium 132 (*)    Potassium 2.5 (*)    Chloride 94 (*)    Glucose,  Bld 111 (*)    BUN 6 (*)    Albumin  3.4 (*)    All other components within normal limits  CBC WITH DIFFERENTIAL/PLATELET - Abnormal; Notable for the following components:   WBC 12.8 (*)    RBC 3.86 (*)    Hemoglobin 11.6 (*)    HCT 33.8 (*)    Platelets 557 (*)    Neutro Abs 9.9 (*)    All other  components within normal limits  URINALYSIS, ROUTINE W REFLEX MICROSCOPIC - Abnormal; Notable for the following components:   APPearance CLOUDY (*)    Protein, ur >=300 (*)    Leukocytes,Ua SMALL (*)    Bacteria, UA MANY (*)    All other components within normal limits  MAGNESIUM  - Abnormal; Notable for the following components:   Magnesium  1.3 (*)    All other components within normal limits  BASIC METABOLIC PANEL WITH GFR    EKG: EKG Interpretation Date/Time:  Friday September 10 2023 10:33:04 EDT Ventricular Rate:  72 PR Interval:  227 QRS Duration:  101 QT Interval:  411 QTC Calculation: 450 R Axis:   83  Text Interpretation: Sinus rhythm Prolonged PR interval Borderline right axis deviation Probable anteroseptal infarct, old Confirmed by Dasie Faden ([id number]) on 09/10/2023 10:35:47 AM  Radiology: [facility] Chest 2 View Result Date: 09/10/2023 CLINICAL DATA:  68 year old female with headache, dizziness, fatigue, weight loss. Undergoing antibiotics for UTI/URI. EXAM: CHEST - 2 VIEW COMPARISON:  Chest CT 12/22/2011 and earlier. FINDINGS: PA and lateral views. Lung volumes and mediastinal contours are within normal limits. Visualized tracheal air column is within normal limits. No pneumothorax, pulmonary edema, pleural effusion or consolidation. Mild chronic bilateral middle lobe lung scarring, also demonstrated by CT in 2013. No other confluent lung opacity. Partially visible lumbar spine fusion hardware. No acute osseous abnormality identified. Negative visible bowel gas. IMPRESSION: No acute cardiopulmonary abnormality. Electronically Signed   By: VEAR Hurst M.D.   On: 09/10/2023 10:32      .Critical Care  Performed by: Neldon Hamp RAMAN, PA Authorized by: Neldon Hamp RAMAN, PA   Critical care provider statement:    Critical care time (minutes):  35   Critical care time was exclusive of:  Separately billable procedures and treating other patients and teaching time   Critical care was necessary to treat or prevent imminent or life-threatening deterioration of the following conditions:  Dehydration and metabolic crisis   Critical care was time spent personally by me on the following activities:  Development of treatment plan with patient or surrogate, review of old charts, re-evaluation of patient's condition, pulse oximetry, ordering and review of radiographic studies, ordering and review of laboratory studies, ordering and performing treatments and interventions, obtaining history from patient or surrogate, examination of patient and evaluation of patient's response to treatment   Care discussed with: admitting provider      Medications Ordered in the ED  potassium chloride  10 mEq in 100 mL IVPB (10 mEq Intravenous New Bag/Given 09/10/23 1425)  metoCLOPramide  (REGLAN ) tablet 10 mg (10 mg Oral Given 09/10/23 0958)  lactated ringers  bolus 1,000 mL (1,000 mLs Intravenous New Bag/Given 09/10/23 0958)  magnesium  sulfate IVPB 1 g 100 mL (1 g Intravenous New Bag/Given 09/10/23 1122)  potassium chloride  SA (KLOR-CON  M) CR tablet 40 mEq (40 mEq Oral Given 09/10/23 1124)                                    Medical Decision Making Amount and/or Complexity of Data Reviewed Labs: ordered. Radiology: ordered.  Risk OTC drugs. Prescription drug management.   This patient presents to the ED for concern of fatigue, this involves a number of treatment options, and is a complaint that carries with it a moderate to severe risk of complications and morbidity. A differential diagnosis was considered for the patient's symptoms which is discussed below:  The differential diagnosis of  weakness includes but is not limited to neurologic causes (GBS, myasthenia gravis, CVA, MS, ALS, transverse myelitis, spinal cord injury, CVA, botulism, ) and other causes: ACS, Arrhythmia, syncope, orthostatic hypotension, sepsis, hypoglycemia, electrolyte disturbance, hypothyroidism, respiratory failure, symptomatic anemia, dehydration, heat injury, polypharmacy, malignancy.   Co morbidities: Discussed in HPI   Brief History:  Patient is a 68 year old female with a past medical history significant for HLD, hypothyroidism, psoriatic arthritis, HTN, reflux  Patient presents emergency room today with complaints of headache, fatigue, feeling lightheaded, having poor appetite for the past 5 days she has a history over the past 1 months of 2 UTIs and a URI.  She feels that her weakness is generalized.  She states that she is simply not felt energetic since her multiple illnesses.  She denies any unilateral weakness denies any blurry vision or double vision.  No slurred speech or changes.  No fevers at home.  She has not experienced any more dysuria or hematuria feels that she is improved from that standpoint.  She has not had any diarrhea or vomiting although she does have some mild nausea at times.  Denies any neck pain or stiffness.  No tick bites.  No history of cancer.    EMR reviewed including pt PMHx, past surgical history and past visits to ER.   See HPI for more details   Lab Tests:  I ordered and independently interpreted labs. Labs notable for     Imaging Studies:  NAD. I personally reviewed all imaging studies and no acute abnormality found. I agree with radiology interpretation.    Cardiac Monitoring:  The patient was maintained on a cardiac monitor.  I personally viewed and interpreted the cardiac monitored which showed an underlying rhythm of: NSR EKG non-ischemic    Medicines ordered:  I ordered medication including 3 doses of IV potassium chloride , 1 g of  magnesium  sulfate, Reglan  for nausea and fatigue Reevaluation of the patient after these medicines showed that the patient improved I have reviewed the patients home medicines and have made adjustments as needed   Critical Interventions:     Consults/Attending Physician   I discussed this case with my attending physician who cosigned this note including patient's presenting symptoms, physical exam, and planned diagnostics and interventions. Attending physician stated agreement with plan or made changes to plan which were implemented.   Reevaluation:  After the interventions noted above I re-evaluated patient and found that they have :improved   Social Determinants of Health:      Problem List / ED Course:  Patient had fatigue, headache, generalized weakness after 5 days of poor p.o. intake after UTI, UTI, URI.  She feels generally weak has a normal neuroexam.  Headache resolved with hydration and Reglan  here in the ER it is not positional nor exertional she describes as a circumferential achy headache.  Now resolved and she feels well offered patient admission and recommended this given her hypokalemia however she prefers to be discharged home which I think is not unreasonable.  Will discharge home with oral potassium repletion, oral magnesium  repletion and recommendations to follow-up Monday with primary care.   Dispostion:  After consideration of the diagnostic results and the patients response to treatment, I feel that the patent would benefit from close outpatient follow-up.   Final diagnoses:  Hypokalemia  Hypomagnesemia  Dehydration  Acute nonintractable headache, unspecified headache type    ED Discharge Orders  Ordered    potassium chloride  SA (KLOR-CON  M) 20 MEQ tablet  2 times daily        09/10/23 1444    Magnesium  Oxide -Mg Supplement 200 MG TABS  Daily        09/10/23 1444               Neldon Inoue Jewett, [place] 09/10/23 1447  Potassium  improved to 3.3 on BMP.  Patient feels much improved tolerated p.o. and wishes to be discharged home -- will discharge him at this time.     Neldon Inoue Beaver, [place] 09/10/23 1457    Dasie Faden, MD 09/15/23 1058

## 2023-09-10 NOTE — Discharge Instructions (Addendum)
 Take magnesium  and potassium supplements as instructed.  Please follow-up with your primary care doctor.  I would like you to have labs rechecked sometime Monday through Wednesday.

## 2023-09-10 NOTE — ED Triage Notes (Signed)
 Pt reports headache, dizziness, poor appetite x5 days. On abx for uti and recent URI. Feels like she has been urinating less

## 2023-09-15 ENCOUNTER — Ambulatory Visit (INDEPENDENT_AMBULATORY_CARE_PROVIDER_SITE_OTHER): Admitting: Family Medicine

## 2023-09-15 ENCOUNTER — Encounter: Payer: Self-pay | Admitting: Family Medicine

## 2023-09-15 VITALS — BP 122/70 | HR 88 | Ht 65.0 in | Wt 102.4 lb

## 2023-09-15 DIAGNOSIS — D649 Anemia, unspecified: Secondary | ICD-10-CM

## 2023-09-15 DIAGNOSIS — R5383 Other fatigue: Secondary | ICD-10-CM | POA: Diagnosis not present

## 2023-09-15 DIAGNOSIS — E871 Hypo-osmolality and hyponatremia: Secondary | ICD-10-CM

## 2023-09-15 NOTE — Progress Notes (Signed)
   Subjective:    Patient ID: April Cox, female    DOB: 1955/02/01, 68 y.o.   MRN: [id number]  Discussed the use of AI scribe software for clinical note transcription with the patient, who gave verbal consent to proceed.  History of Present Illness   April Cox is a 68 year old female who presents with fatigue and concerns about low potassium and magnesium  levels.  She has been experiencing significant fatigue, . She was advised to visit the emergency room, where she was given potassium supplements. However, she did not take them due to concerns about the dosage being too high. She attempted to contact her doctor but was advised not to take the potassium until she could see her doctor.  She was given 20 meq pills.    She was also given magnesium  oxide to take one tablet daily for three days, but she did not receive this prescription from her pharmacist. She is concerned about her magnesium  levels being low.  She reports improvement in her symptoms, stating she felt better this morning and was able to perform some household activities and eat lunch.  Regarding her urinary symptoms, she is not experiencing any urinary tract infection symptoms currently. She mentions having an irritable bladder and is urinating less frequently than usual, but her urine is light yellow, indicating hydration. She recalls her urine being dark initially but has been monitoring it to ensure it remains clear.  She has a history of anemia and is concerned about her iron levels, noting that her hemoglobin was slightly low at 11.6. She has been anemic off and on throughout her life and is curious about her iron status.  She takes several vitamins, including vitamin D , vitamin C, and Preservision, but is unsure if she is taking a multivitamin with iron.           Review of Systems     Objective:    Physical Exam Alert and in no distress otherwise not examined     The emergency room record and  blood work was evaluated as well as notes.      Assessment & Plan:     Electrolyte imbalance (hypokalemia and hypomagnesemia) Recent hypokalemia and hypomagnesemia causing weakness. Pharmacist incorrectly advised against potassium dosage. Current symptoms show some improvement.  - Order basic metabolic panel to assess electrolyte levels. - Check magnesium  level.  Fatigue Persistent fatigue possibly due to electrolyte imbalance or mild anemia. Hydration adequate. - Evaluate electrolyte levels and anemia status. - Order ferritin level to assess iron stores.  Mild anemia Mild anemia with low hemoglobin possibly contributing to fatigue. No recent iron studies. - Order ferritin level to assess iron stores. - Discuss multivitamin with iron supplementation if ferritin is low.     Will await the blood work and if low potassium still there, recommend she take the 20 mEq daily.

## 2023-09-16 ENCOUNTER — Ambulatory Visit: Payer: Self-pay | Admitting: Family Medicine

## 2023-09-16 LAB — IRON,TIBC AND FERRITIN PANEL
Ferritin: 381 ng/mL — ABNORMAL HIGH (ref 15–150)
Iron Saturation: 25 % (ref 15–55)
Iron: 58 ug/dL (ref 27–139)
Total Iron Binding Capacity: 231 ug/dL — ABNORMAL LOW (ref 250–450)
UIBC: 173 ug/dL (ref 118–369)

## 2023-09-16 LAB — BASIC METABOLIC PANEL WITH GFR
BUN/Creatinine Ratio: 11 — ABNORMAL LOW (ref 12–28)
BUN: 6 mg/dL — ABNORMAL LOW (ref 8–27)
CO2: 21 mmol/L (ref 20–29)
Calcium: 9.5 mg/dL (ref 8.7–10.3)
Chloride: 99 mmol/L (ref 96–106)
Creatinine, Ser: 0.56 mg/dL — ABNORMAL LOW (ref 0.57–1.00)
Glucose: 118 mg/dL — ABNORMAL HIGH (ref 70–99)
Potassium: 3.7 mmol/L (ref 3.5–5.2)
Sodium: 135 mmol/L (ref 134–144)
eGFR: 98 mL/min/1.73 (ref >59)

## 2023-09-16 LAB — MAGNESIUM: Magnesium: 1.4 mg/dL — ABNORMAL LOW (ref 1.6–2.3)

## 2023-09-21 DIAGNOSIS — M810 Age-related osteoporosis without current pathological fracture: Secondary | ICD-10-CM | POA: Diagnosis not present

## 2023-09-22 ENCOUNTER — Telehealth: Payer: Self-pay | Admitting: Family Medicine

## 2023-09-22 MED ORDER — POTASSIUM CHLORIDE CRYS ER 20 MEQ PO TBCR: 20.0000 meq | EXTENDED_RELEASE_TABLET | Freq: Two times a day (BID) | ORAL | 0 refills | Status: DC

## 2023-09-22 NOTE — Telephone Encounter (Signed)
 Requesting a refill on  potassium chloride  SA (KLOR-CON  M) 20 MEQ tablet sent to  [facility] [id number] [place], [place] - [id number]

## 2023-09-28 NOTE — Progress Notes (Unsigned)
 Office Visit Note  Patient: April Cox             Date of Birth: 1955-11-13           MRN: [id number]             PCP: [name], MD Referring: [name], MD Visit Date: 10/12/2023 Occupation: @GUAROCC @  Subjective:  Recent infections  History of Present Illness: April Cox is a 68 y.o. female with history of psoriatic arthritis and osteoarthritis.  Patient remains on  Methotrexate  1 mL sq injections every 7 days and folic acid  1 mg 2 tablets daily.  Patient reports that she recently had a gap in therapy for about 1 month due to being treated with antibiotics for a UTI x 3 and an upper respiratory tract infection.  She took the second dose of methotrexate  on Sunday and has not had any recurrence of an infection.  Patient states that there has been discussion of a referral to urology due to the recurrent UTIs.  She is not currently taking any antibiotics. Patient denies any signs or symptoms of a flare during the gap in therapy.  She recently had an exacerbation pain in the left knee which she attributes to climbing steps while performing yard work.  She denies any warmth or swelling in the left knee and states that the symptoms resolved within 2 days. She denies any Achilles tendinitis or plantar fasciitis.  She continues to have intermittent SI joint pain.  She denies any active psoriasis at this time.     Activities of Daily Living:  Patient reports morning stiffness for 15-30 minutes.   Patient Denies nocturnal pain.  Difficulty dressing/grooming: Denies Difficulty climbing stairs: Reports Difficulty getting out of chair: Denies Difficulty using hands for taps, buttons, cutlery, and/or writing: Reports  Review of Systems  Constitutional:  Negative for fatigue.  HENT:  Positive for mouth dryness. Negative for mouth sores.   Eyes:  Positive for dryness.  Respiratory:  Negative for shortness of breath.   Cardiovascular:  Negative for chest pain and  palpitations.  Gastrointestinal:  Negative for blood in stool, constipation and diarrhea.  Endocrine: Positive for increased urination.  Genitourinary:  Negative for involuntary urination.  Musculoskeletal:  Positive for joint pain, joint pain, joint swelling and morning stiffness. Negative for gait problem, myalgias, muscle weakness, muscle tenderness and myalgias.  Skin:  Positive for sensitivity to sunlight. Negative for color change, rash and hair loss.  Allergic/Immunologic: Negative for susceptible to infections.  Neurological:  Negative for dizziness and headaches.  Hematological:  Negative for swollen glands.  Psychiatric/Behavioral:  Negative for depressed mood and sleep disturbance. The patient is not nervous/anxious.     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   Nonrheumatic aortic valve stenosis 08/12/2021   Sicca complex 04/01/2017   Psoriasis 04/01/2017   Dry eye 01/01/2017   Spondylosis of lumbar region without myelopathy or radiculopathy 01/07/2016   High risk medication use 01/07/2016   Essential hypertension 09/10/2014   History of HPV infection 09/10/2014   History of colonic polyps 09/10/2014   Osteoporosis 09/10/2014   Gastroesophageal reflux disease without esophagitis 09/10/2014   Cigarette smoker 05/11/2011   Hyperlipidemia 08/22/2010   Hypothyroidism 08/22/2010   Psoriatic arthritis (HCC) 08/22/2010    Past Medical History:  Diagnosis Date   Abnormal Pap smear of cervix    Allergy    seasonal  Anemia    history of anemia   Arthritis    Cataract    bilateral,removed   Diverticulosis of colon    Family history of adverse reaction to anesthesia    mother PONV   GERD (gastroesophageal reflux disease)    Heart murmur    asymptomatic per pt   History of adenomatous polyp of colon    11-06-2009  tubullar   HPV in female    Hyperlipidemia    Hypertension     Hypothyroidism    Irritable bladder    per patient   Mild aortic valve stenosis    per echo 10-09-2010  ef 60-65%  valva area 1.48cm   Osteoporosis    Psoriasis    Psoriatic arthritis (HCC)    Dr. Dolphus   Wears glasses     Family History  Problem Relation Age of Onset   Colon polyps Mother    Arthritis Mother        OA    Psoriasis Mother    Other Father        accidental death/MVA at age 50   Hypertension Brother    Other Brother        1 brother drowned   Cancer Brother    Esophageal cancer Brother    Brain cancer Brother    Hypertension Maternal Grandmother    Diabetes Maternal Grandmother    Hypertension Maternal Grandfather    Heart disease Neg Hx    Colon cancer Neg Hx    Stomach cancer Neg Hx    Crohn's disease Neg Hx    Rectal cancer Neg Hx    Ulcerative colitis Neg Hx    Past Surgical History:  Procedure Laterality Date   ABDOMINAL HYSTERECTOMY     ANTERIOR LAT LUMBAR FUSION  12/25/2011   Procedure: ANTERIOR LATERAL LUMBAR FUSION 1 LEVEL;  Surgeon: Alm GORMAN Molt, MD;  Location: [facility];  Service: Neurosurgery;  Laterality: Left;  LUMBAR FOUR-FIVE   BLADDER SUSPENSION N/A 12/19/2014   Procedure: TRANSVAGINAL TAPE (TVT) PROCEDURE;  Surgeon: Jon Rummer, MD;  Location: [facility];  Service: Gynecology;  Laterality: N/A;   CARPAL TUNNEL RELEASE Right 04/2013   CATARACT EXTRACTION, BILATERAL     05/2020, 06/2020   CERVICAL CONIZATION W/BX N/A 08/28/2014   Procedure: CONIZATION CERVIX WITH BIOPSY;  Surgeon: Rome Rigg, MD;  Location[facility];  Service: Gynecology;  Laterality: N/A;   COLONOSCOPY  11/06/2009   COLONOSCOPY  08/2019   COLONOSCOPY  10/2022   CYSTO N/A 12/19/2014   Procedure: CYSTO;  Surgeon: Jon Rummer, MD;  Location: [facility];  Service: Gynecology;  Laterality: N/A;   DENTAL SURGERY     HYSTEROSCOPY WITH D & C  04/04/2008   KNEE ARTHROPLASTY     LUMBAR PERCUTANEOUS PEDICLE SCREW 1 LEVEL  12/25/2011   Procedure:  LUMBAR PERCUTANEOUS PEDICLE SCREW 1 LEVEL;  Surgeon: Alm GORMAN Molt, MD;  Location: [facility];  Service: Neurosurgery;  Laterality: Left;  LUMBAR FOUR-FIVE   PERONEAL NERVE DECOMPRESSION Left 12/27/2009   for neuropathy with  foot drop   POLYPECTOMY     TRANSTHORACIC ECHOCARDIOGRAM  10/09/2010   mild LVH,  ef 60-65%,  grade I diastolic dysfunction,  very mild AV stenosis (area 1.48cm/S/2(VTI) with no regurg./  mild MV calcification without stenosis or regur. /  trivial TR   VAGINAL HYSTERECTOMY Bilateral 12/19/2014   Procedure: TOTAL VAGINAL HYSTERECTOMY WITH BILATERAL SALPINGECTOMY;  Surgeon: Rome Rigg, MD;  Location: [facility];  Service: Gynecology;  Laterality: Bilateral;   Social History   Social History Narrative   Not on file   Immunization History  Administered Date(s) Administered    sv, Bivalent, Protein Subunit Rsvpref,pf (Abrysvo) 10/06/2021   Fluad Quad(high Dose 65+) 10/11/2018   INFLUENZA, HIGH DOSE SEASONAL PF 11/16/2020, 11/06/2021, 11/17/2022   Influenza Split 12/19/2010, 12/01/2011   Influenza,inj,Quad PF,6+ Mos 01/21/2016   Influenza,inj,quad, With Preservative 12/04/2017   Moderna Covid-19 Fall Seasonal Vaccine 70yrs & older 11/11/2021   PFIZER(Purple Top)SARS-COV-2 Vaccination 02/27/2019, 03/24/2019, 10/02/2019, 05/23/2020   PNEUMOCOCCAL CONJUGATE-20 05/28/2023   Pfizer Covid-19 Vaccine Bivalent Booster 76yrs & up 10/23/2020   Pfizer(Comirnaty)Fall Seasonal Vaccine 12 years and older 11/22/2022   Pneumococcal Conjugate-13 01/20/2014   Pneumococcal Polysaccharide-23 08/01/2010   Tdap 01/21/2016, 09/15/2017   Zoster Recombinant(Shingrix) 12/11/2017, 02/27/2018     Objective: Vital Signs: BP (!) 155/83 (BP Location: Left Arm, Patient Position: Sitting, Cuff Size: Normal)   Pulse 77   Temp 98.2 F (36.8 C)   Resp 14   Ht 5' 5 (1.651 m)   Wt 106 lb 12.8 oz (48.4 kg)   BMI 17.77 kg/m    Physical Exam Vitals and nursing note reviewed.  Constitutional:       Appearance: She is well-developed.  HENT:     Head: Normocephalic and atraumatic.  Eyes:     Conjunctiva/sclera: Conjunctivae normal.  Cardiovascular:     Rate and Rhythm: Normal rate and regular rhythm.     Heart sounds: Murmur heard.  Pulmonary:     Effort: Pulmonary effort is normal.     Breath sounds: Normal breath sounds.  Abdominal:     General: Bowel sounds are normal.     Palpations: Abdomen is soft.  Musculoskeletal:     Cervical back: Normal range of motion.  Lymphadenopathy:     Cervical: No cervical adenopathy.  Skin:    General: Skin is warm and dry.     Capillary Refill: Capillary refill takes less than 2 seconds.  Neurological:     Mental Status: She is alert and oriented to person, place, and time.  Psychiatric:        Behavior: Behavior normal.      Musculoskeletal Exam: C-spine, thoracic spine, lumbar spine have good range of motion.  No midline spinal tenderness.  Mild SI joint tenderness.  Shoulder joints, elbow joints, wrist joints, MCPs, PIPs, DIPs have good range of motion with no synovitis.  Severe PIP and DIP thickening.  Complete fist formation bilaterally.  Hip joints have good range of motion with no groin pain.  Knee joints have good range of motion no warmth or effusion.  Ankle joints have good range of motion no tenderness or joint swelling.  No evidence of Achilles tendinitis or plantar fasciitis.   CDAI Exam: CDAI Score: -- Patient Global: --; Provider Global: -- Swollen: --; Tender: -- Joint Exam 10/12/2023   No joint exam has been documented for this visit   There is currently no information documented on the homunculus. Go to the Rheumatology activity and complete the homunculus joint exam.  Investigation: No additional findings.  Imaging: No results found.   Recent Labs: Lab Results  Component Value Date   WBC 12.8 (H) 09/10/2023   HGB 11.6 (L) 09/10/2023   PLT 557 (H) 09/10/2023   NA 135 09/15/2023   K 3.7 09/15/2023    CL 99 09/15/2023   CO2 21 09/15/2023   GLUCOSE 118 (H) 09/15/2023   BUN 6 (L) 09/15/2023  CREATININE 0.56 (L) 09/15/2023   BILITOT 0.8 09/10/2023   ALKPHOS 84 09/10/2023   AST 18 09/10/2023   ALT 12 09/10/2023   PROT 7.0 09/10/2023   ALBUMIN  3.4 (L) 09/10/2023   CALCIUM  9.5 09/15/2023   GFRAA 113 04/02/2020   QFTBGOLD Negative 08/02/2015   QFTBGOLDPLUS NEGATIVE 06/10/2022    Speciality Comments: Prior therapy: Remicade  and Humira (inadequate response) Osteoporosis managed by PCP. Last DXA August 2019.  Procedures:  No procedures performed Allergies: Bee venom, Codeine, Other, Penicillin g, Penicillins, and Gluten meal     Assessment / Plan:     Visit Diagnoses: Psoriatic arthritis (HCC): No synovitis or dactylitis noted on exam.  No evidence of Achilles tendinitis or plantar fasciitis.  No active psoriasis.  Patient continues to experience intermittent SI joint pain and has mild tenderness upon palpation bilaterally.  Patient recently had a gap in therapy for about 1 month due to experiencing a UTI x 3 and an upper respiratory tract infection.  Patient states that she has now received methotrexate  and had the second dose on 10/10/2023.  She has not noticed any signs or symptoms of a flare during the gap in therapy. No active inflammation noted today.  No medication changes will be made at this time.  She will notify us  if she develops any new or worsening symptoms.  She will follow-up in the office in 5 months or sooner if needed.   Psoriasis: No active psoriasis at this time.  High risk medication use -Methotrexate  1 mL sq injections every 7 days and folic acid  1 mg 2 tablets daily. Inadequate response to remicade . CBC and CMP updated on 09/10/23.  Her next lab work will be due in November and every 3 months.  Discussed the importance of holding methotrexate  if she develops signs or symptoms of an infection and to resume once the infection has completely cleared.    Sicca  complex: Chronic   Primary osteoarthritis of both hands - - Clinical findings are consistent with psoriatic arthritis and osteoarthritis overlap with thickening and subluxation of several PIP and DIP joints.  Spondylosis of lumbar spine: No midline spinal tenderness.  No symptoms of radiculopathy.   Age-related osteoporosis without current pathological fracture: DEXA scan on 04/24/2021  T score was -3.8 in the left radius.  She completed Tymlos  prescribed by Dr. Joyce. First infusion of IV Reclast  in Dec 2023. Second IV Reclast  infusion administered on 12/29/22.   DEXA 03/24/23: Osteopenia--currently on drug holiday.   Trochanteric bursitis of right hip: Not currently symptomatic.   Other medical conditions are listed as follows:  History of gastroesophageal reflux (GERD)  History of hypertension: Blood pressure was elevated today in the office was rechecked prior to leaving.  Patient was advised to monitor blood pressure closely to reach out to PCP if her blood pressure remains elevated.  History of hypothyroidism  History of colonic polyps  Smoker  Orders: No orders of the defined types were placed in this encounter.  No orders of the defined types were placed in this encounter.    Follow-Up Instructions: Return in about 5 months (around 03/13/2024) for Psoriatic arthritis, Osteoarthritis.   Waddell CHRISTELLA Craze, PA-C  Note - This record has been created using Dragon software.  Chart creation errors have been sought, but may not always  have been located. Such creation errors do not reflect on  the standard of medical care.

## 2023-09-29 ENCOUNTER — Other Ambulatory Visit: Payer: Self-pay | Admitting: Family Medicine

## 2023-09-29 DIAGNOSIS — K219 Gastro-esophageal reflux disease without esophagitis: Secondary | ICD-10-CM

## 2023-10-12 ENCOUNTER — Ambulatory Visit (INDEPENDENT_AMBULATORY_CARE_PROVIDER_SITE_OTHER): Payer: Medicare Other

## 2023-10-12 ENCOUNTER — Ambulatory Visit: Attending: Physician Assistant | Admitting: Physician Assistant

## 2023-10-12 ENCOUNTER — Encounter: Payer: Self-pay | Admitting: Physician Assistant

## 2023-10-12 VITALS — BP 155/83 | HR 77 | Temp 98.2°F | Resp 14 | Ht 65.0 in | Wt 106.8 lb

## 2023-10-12 VITALS — Ht 65.0 in | Wt 110.0 lb

## 2023-10-12 DIAGNOSIS — Z8639 Personal history of other endocrine, nutritional and metabolic disease: Secondary | ICD-10-CM

## 2023-10-12 DIAGNOSIS — M19041 Primary osteoarthritis, right hand: Secondary | ICD-10-CM | POA: Diagnosis not present

## 2023-10-12 DIAGNOSIS — Z8719 Personal history of other diseases of the digestive system: Secondary | ICD-10-CM | POA: Diagnosis not present

## 2023-10-12 DIAGNOSIS — M47816 Spondylosis without myelopathy or radiculopathy, lumbar region: Secondary | ICD-10-CM

## 2023-10-12 DIAGNOSIS — Z860100 Personal history of colon polyps, unspecified: Secondary | ICD-10-CM | POA: Diagnosis not present

## 2023-10-12 DIAGNOSIS — L4050 Arthropathic psoriasis, unspecified: Secondary | ICD-10-CM

## 2023-10-12 DIAGNOSIS — Z0000 Encounter for general adult medical examination without abnormal findings: Secondary | ICD-10-CM

## 2023-10-12 DIAGNOSIS — M7061 Trochanteric bursitis, right hip: Secondary | ICD-10-CM | POA: Diagnosis not present

## 2023-10-12 DIAGNOSIS — M810 Age-related osteoporosis without current pathological fracture: Secondary | ICD-10-CM | POA: Diagnosis not present

## 2023-10-12 DIAGNOSIS — L409 Psoriasis, unspecified: Secondary | ICD-10-CM

## 2023-10-12 DIAGNOSIS — M19042 Primary osteoarthritis, left hand: Secondary | ICD-10-CM

## 2023-10-12 DIAGNOSIS — M3500 Sicca syndrome, unspecified: Secondary | ICD-10-CM

## 2023-10-12 DIAGNOSIS — Z8679 Personal history of other diseases of the circulatory system: Secondary | ICD-10-CM

## 2023-10-12 DIAGNOSIS — Z79899 Other long term (current) drug therapy: Secondary | ICD-10-CM | POA: Diagnosis not present

## 2023-10-12 DIAGNOSIS — F17200 Nicotine dependence, unspecified, uncomplicated: Secondary | ICD-10-CM

## 2023-10-12 NOTE — Progress Notes (Signed)
 Subjective:   April Cox is a 68 y.o. who presents for a Medicare Wellness preventive visit.  As a reminder, Annual Wellness Visits don't include a physical exam, and some assessments may be limited, especially if this visit is performed virtually. We may recommend an in-person follow-up visit with your provider if needed.  Visit Complete: Virtual I connected with  April Cox on 10/12/23 by a video and audio enabled telemedicine application and verified that I am speaking with the correct person using two identifiers.  Patient Location: Home  Provider Location: Office/Clinic  I discussed the limitations of evaluation and management by telemedicine. The patient expressed understanding and agreed to proceed.  Vital Signs: Because this visit was a virtual/telehealth visit, some criteria may be missing or patient reported. Any vitals not documented were not able to be obtained and vitals that have been documented are patient reported.    Persons Participating in Visit: Patient.  AWV Questionnaire: Yes: Patient Medicare AWV questionnaire was completed by the patient on 10/09/2023; I have confirmed that all information answered by patient is correct and no changes since this date.  Cardiac Risk Factors include: advanced age (>63men, >41 women);dyslipidemia;hypertension     Objective:    Today's Vitals   10/12/23 0833  Weight: 110 lb (49.9 kg)  Height: 5' 5 (1.651 m)   Body mass index is 18.3 kg/m.     10/12/2023    8:41 AM 10/06/2022    8:47 AM 07/02/2022    9:46 PM 08/01/2021    9:38 AM 06/28/2020    9:37 AM 09/19/2017   11:11 AM 09/15/2017    2:32 AM  Advanced Directives  Does Patient Have a Medical Advance Directive? Yes Yes No Yes Yes No  No   Type of Estate agent of [place];Living will Healthcare Power of [place];Living will  Healthcare Power of [place];Living will Living will;Healthcare Power of Attorney    Does patient want to make changes  to medical advance directive?     No - Patient declined    Copy of Healthcare Power of Attorney in Chart? Yes - validated most recent copy scanned in chart (See row information) Yes - validated most recent copy scanned in chart (See row information)  Yes - validated most recent copy scanned in chart (See row information) Yes - validated most recent copy scanned in chart (See row information)    Would patient like information on creating a medical advance directive?      No - Patient declined       Data saved with a previous flowsheet row definition    Current Medications (verified) Outpatient Encounter Medications as of 10/12/2023  Medication Sig   atorvastatin  (LIPITOR) 40 MG tablet Take 1 tablet (40 mg total) by mouth daily.   calcium -vitamin D  (OSCAL WITH D) 500-200 MG-UNIT per tablet Take 1 tablet by mouth daily.   Cholecalciferol (VITAMIN D  PO) Take 500 mg by mouth daily.    CRANBERRY PO Take by mouth daily.   EPINEPHrine  0.3 mg/0.3 mL IJ SOAJ injection INJECT INTO THE MIDDLE OF THE OUTER THIGH AND HOLD FOR 10 SECONDS AS NEEDED FOR SEVERE ALLERGIC REACTION THEN CALL 911 IF USED   Estradiol  10 MCG TABS vaginal tablet Place vaginally.   folic acid  (FOLVITE ) 1 MG tablet TAKE 2 TABLETS BY MOUTH DAILY   levothyroxine  (SYNTHROID ) 25 MCG tablet TAKE 1 TABLET BY MOUTH DAILY BEFORE BREAKFAST   methotrexate  50 MG/2ML injection INJECT 1 MILLILITER (CC) UNDER THE  SKIN ONCE WEEKLY   metoprolol  succinate (TOPROL -XL) 50 MG 24 hr tablet TAKE 1 TABLET BY MOUTH DAILY IMMEDIATELY FOLLOWING A MEAL   MONOJECT 1CC TB SYR 27GX1/2 27G X 1/2 1 ML MISC USE AS DIRECTED   Multiple Vitamins-Minerals (PRESERVISION AREDS PO) Take by mouth daily. Take one daily   pantoprazole  (PROTONIX ) 40 MG tablet TAKE 1 TABLET BY MOUTH DAILY   Polyvinyl Alcohol-Povidone (REFRESH OP) Apply to eye as needed.   vitamin C (ASCORBIC ACID) 250 MG tablet Take 250 mg by mouth daily.   potassium chloride  SA (KLOR-CON  M) 20 MEQ tablet Take  1 tablet (20 mEq total) by mouth 2 (two) times daily for 5 days. (Patient not taking: Reported on 10/12/2023)   sulfamethoxazole -trimethoprim  (BACTRIM  DS) 800-160 MG tablet Take 1 tablet by mouth 2 (two) times daily. (Patient not taking: Reported on 10/12/2023)   Zoledronic  Acid (RECLAST  IV) Inject into the vein. First IV: 12/2021, most recent IV: 12/2022 (Patient not taking: Reported on 10/12/2023)   No facility-administered encounter medications on file as of 10/12/2023.    Allergies (verified) Bee venom, Codeine, Other, Penicillins, and Gluten meal   History: Past Medical History:  Diagnosis Date   Abnormal Pap smear of cervix    Allergy    seasonal   Anemia    history of anemia   Arthritis    Cataract    bilateral,removed   Diverticulosis of colon    Family history of adverse reaction to anesthesia    mother PONV   GERD (gastroesophageal reflux disease)    Heart murmur    asymptomatic per pt   History of adenomatous polyp of colon    11-06-2009  tubullar   HPV in female    Hyperlipidemia    Hypertension    Hypothyroidism    Irritable bladder    per patient   Mild aortic valve stenosis    per echo 10-09-2010  ef 60-65%  valva area 1.48cm   Osteoporosis    Psoriasis    Psoriatic arthritis (HCC)    Dr. Dolphus   Wears glasses    Past Surgical History:  Procedure Laterality Date   ABDOMINAL HYSTERECTOMY     ANTERIOR LAT LUMBAR FUSION  12/25/2011   Procedure: ANTERIOR LATERAL LUMBAR FUSION 1 LEVEL;  Surgeon: Alm GORMAN Molt, MD;  Location: [facility];  Service: Neurosurgery;  Laterality: Left;  LUMBAR FOUR-FIVE   BLADDER SUSPENSION N/A 12/19/2014   Procedure: TRANSVAGINAL TAPE (TVT) PROCEDURE;  Surgeon: Jon Rummer, MD;  Location: [facility];  Service: Gynecology;  Laterality: N/A;   CARPAL TUNNEL RELEASE Right 04/2013   CATARACT EXTRACTION, BILATERAL     05/2020, 06/2020   CERVICAL CONIZATION W/BX N/A 08/28/2014   Procedure: CONIZATION CERVIX WITH BIOPSY;  Surgeon:  Rome Rigg, MD;  Location: [facility];  Service: Gynecology;  Laterality: N/A;   COLONOSCOPY  11/06/2009   COLONOSCOPY  08/2019   COLONOSCOPY  10/2022   CYSTO N/A 12/19/2014   Procedure: CYSTO;  Surgeon: Jon Rummer, MD;  Location: [facility];  Service: Gynecology;  Laterality: N/A;   DENTAL SURGERY     HYSTEROSCOPY WITH D & C  04/04/2008   KNEE ARTHROPLASTY     LUMBAR PERCUTANEOUS PEDICLE SCREW 1 LEVEL  12/25/2011   Procedure: LUMBAR PERCUTANEOUS PEDICLE SCREW 1 LEVEL;  Surgeon: Alm GORMAN Molt, MD;  Location: [facility];  Service: Neurosurgery;  Laterality: Left;  LUMBAR FOUR-FIVE   PERONEAL NERVE DECOMPRESSION Left 12/27/2009   for neuropathy with  foot drop   POLYPECTOMY     TRANSTHORACIC ECHOCARDIOGRAM  10/09/2010   mild LVH,  ef 60-65%,  grade I diastolic dysfunction,  very mild AV stenosis (area 1.48cm/S/2(VTI) with no regurg./  mild MV calcification without stenosis or regur. /  trivial TR   VAGINAL HYSTERECTOMY Bilateral 12/19/2014   Procedure: TOTAL VAGINAL HYSTERECTOMY WITH BILATERAL SALPINGECTOMY;  Surgeon: Rome Rigg, MD;  Location: [facility];  Service: Gynecology;  Laterality: Bilateral;   Family History  Problem Relation Age of Onset   Colon polyps Mother    Arthritis Mother        OA    Psoriasis Mother    Other Father        accidental death/MVA at age 49   Hypertension Brother    Other Brother        1 brother drowned   Cancer Brother    Esophageal cancer Brother    Brain cancer Brother    Hypertension Maternal Grandmother    Diabetes Maternal Grandmother    Hypertension Maternal Grandfather    Heart disease Neg Hx    Colon cancer Neg Hx    Stomach cancer Neg Hx    Crohn's disease Neg Hx    Rectal cancer Neg Hx    Ulcerative colitis Neg Hx    Social History   Socioeconomic History   Marital status: Married    Spouse name: Not on file   Number of children: Not on file   Years of education: Not on file   Highest education level:  Bachelor's degree (e.g., BA, AB, BS)  Occupational History   Not on file  Tobacco Use   Smoking status: Some Days    Current packs/day: 0.25    Average packs/day: 0.3 packs/day for 16.0 years (4.0 ttl pk-yrs)    Types: Cigarettes    Passive exposure: Current   Smokeless tobacco: Never   Tobacco comments:    3-4 cigs / day 08/03/19  Vaping Use   Vaping status: Never Used  Substance and Sexual Activity   Alcohol use: Not Currently   Drug use: Never   Sexual activity: Yes  Other Topics Concern   Not on file  Social History Narrative   Not on file   Social Drivers of Health   Financial Resource Strain: Low Risk  (10/12/2023)   Overall Financial Resource Strain (CARDIA)    Difficulty of Paying Living Expenses: Not hard at all  Food Insecurity: No Food Insecurity (10/12/2023)   Hunger Vital Sign    Worried About Running Out of Food in the Last Year: Never true    Ran Out of Food in the Last Year: Never true  Transportation Needs: No Transportation Needs (10/12/2023)   PRAPARE - Administrator, Civil Service (Medical): No    Lack of Transportation (Non-Medical): No  Physical Activity: Insufficiently Active (10/12/2023)   Exercise Vital Sign    Days of Exercise per Week: 2 days    Minutes of Exercise per Session: 30 min  Stress: No Stress Concern Present (10/12/2023)   [employer] of Occupational Health - Occupational Stress Questionnaire    Feeling of Stress: Not at all  Social Connections: Socially Integrated (10/12/2023)   Social Connection and Isolation Panel    Frequency of Communication with Friends and Family: More than three times a week    Frequency of Social Gatherings with Friends and Family: More than three times a week    Attends Religious Services: More than 4 times  per year    Active Member of Clubs or Organizations: Yes    Attends Banker Meetings: More than 4 times per year    Marital Status: Married    Tobacco Counseling Ready to  quit: Not Answered Counseling given: Not Answered Tobacco comments: 3-4 cigs / day 08/03/19    Clinical Intake:  Pre-visit preparation completed: Yes  Pain : No/denies pain     Nutritional Risks: None Diabetes: No  No results found for: HGBA1C   How often do you need to have someone help you when you read instructions, pamphlets, or other written materials from your doctor or pharmacy?: 1 - Never  Interpreter Needed?: No  Information entered by :: NAllen LPN   Activities of Daily Living     10/09/2023    6:18 PM  In your present state of health, do you have any difficulty performing the following activities:  Hearing? 0  Vision? 0  Difficulty concentrating or making decisions? 0  Walking or climbing stairs? 0  Dressing or bathing? 0  Doing errands, shopping? 0  Preparing Food and eating ? N  Using the Toilet? N  In the past six months, have you accidently leaked urine? Y  Do you have problems with loss of bowel control? N  Managing your Medications? N  Managing your Finances? N  Housekeeping or managing your Housekeeping? N    Patient Care Team: [name], MD as PCP - General (Family Medicine) Pa, [facility] Rivard, Nena, MD as Consulting Physician (Obstetrics and Gynecology) [name], MD as Consulting Physician (Rheumatology)  I have updated your Care Teams any recent Medical Services you may have received from other providers in the past year.     Assessment:   This is a routine wellness examination for [facility].  Hearing/Vision screen Hearing Screening - Comments:: Denies hearing issues Vision Screening - Comments:: Regular eye exams, [facility]   Goals Addressed             This Visit's Progress    Patient Stated       10/12/2023, wants to start exercising regularly       Depression Screen     10/12/2023    8:42 AM 04/28/2023    1:48 PM 10/06/2022    8:50 AM 08/01/2021    9:40 AM 06/28/2020    9:38 AM 05/27/2020     4:11 PM 06/28/2019    8:45 AM  PHQ 2/9 Scores  PHQ - 2 Score 0 0 0 0 0 0 0  PHQ- 9 Score 1  0 0       Fall Risk     10/09/2023    6:18 PM 04/28/2023    1:48 PM 10/02/2022    8:52 AM 09/08/2022    9:04 AM 08/01/2021    9:39 AM  Fall Risk   Falls in the past year? 0 0 0 0 0  Number falls in past yr: 0 0 0 0 0  Injury with Fall? 0 0 0 0 0  Risk for fall due to : Medication side effect No Fall Risks Medication side effect No Fall Risks Medication side effect  Follow up Falls prevention discussed;Falls evaluation completed Falls evaluation completed Falls prevention discussed;Falls evaluation completed Falls evaluation completed Falls evaluation completed;Education provided;Falls prevention discussed      Data saved with a previous flowsheet row definition    MEDICARE RISK AT HOME:  Medicare Risk at Home Any stairs in or around the home?: (  Patient-Rptd) Yes If so, are there any without handrails?: (Patient-Rptd) No Home free of loose throw rugs in walkways, pet beds, electrical cords, etc?: (Patient-Rptd) Yes Life alert?: (Patient-Rptd) No Use of a cane, walker or w/c?: (Patient-Rptd) No Grab bars in the bathroom?: (Patient-Rptd) No Shower chair or bench in shower?: (Patient-Rptd) No Elevated toilet seat or a handicapped toilet?: (Patient-Rptd) No  TIMED UP AND GO:  Was the test performed?  No  Cognitive Function: 6CIT completed        10/12/2023    8:43 AM 10/06/2022    8:51 AM 08/01/2021    9:42 AM  6CIT Screen  What Year? 0 points 0 points 0 points  What month? 0 points 0 points 0 points  What time? 0 points 0 points 0 points  Count back from 20 0 points 0 points 0 points  Months in reverse 0 points 0 points 0 points  Repeat phrase 4 points 0 points 0 points  Total Score 4 points 0 points 0 points    Immunizations Immunization History  Administered Date(s) Administered    sv, Bivalent, Protein Subunit Rsvpref,pf (Abrysvo) 10/06/2021   Fluad Quad(high Dose 65+)  10/11/2018   INFLUENZA, HIGH DOSE SEASONAL PF 11/16/2020, 11/06/2021, 11/17/2022   Influenza Split 12/19/2010, 12/01/2011   Influenza,inj,Quad PF,6+ Mos 01/21/2016   Influenza,inj,quad, With Preservative 12/04/2017   Moderna Covid-19 Fall Seasonal Vaccine 10yrs & older 11/11/2021   PFIZER(Purple Top)SARS-COV-2 Vaccination 02/27/2019, 03/24/2019, 10/02/2019, 05/23/2020   PNEUMOCOCCAL CONJUGATE-20 05/28/2023   Pfizer Covid-19 Vaccine Bivalent Booster 33yrs & up 10/23/2020   Pfizer(Comirnaty)Fall Seasonal Vaccine 12 years and older 11/22/2022   Pneumococcal Conjugate-13 01/20/2014   Pneumococcal Polysaccharide-23 08/01/2010   Tdap 01/21/2016, 09/15/2017   Zoster Recombinant(Shingrix) 12/11/2017, 02/27/2018    Screening Tests Health Maintenance  Topic Date Due   Influenza Vaccine  08/20/2023   COVID-19 Vaccine (8 - Pfizer risk 2024-25 season) 09/20/2023   Medicare Annual Wellness (AWV)  10/11/2024   Mammogram  03/24/2025   DTaP/Tdap/Td (3 - Td or Tdap) 09/16/2027   Colonoscopy  10/27/2027   DEXA SCAN  Completed   Hepatitis C Screening  Completed   Zoster Vaccines- Shingrix  Completed   HPV VACCINES  Aged Out   Meningococcal B Vaccine  Aged Out   Pneumococcal Vaccine: 50+ Years  Discontinued    Health Maintenance Items Addressed: Getting flu and covid vaccine in October.  Additional Screening:  Vision Screening: Recommended annual ophthalmology exams for early detection of glaucoma and other disorders of the eye. Is the patient up to date with their annual eye exam?  Yes  Who is the provider or what is the name of the office in which the patient attends annual eye exams? [facility]  Dental Screening: Recommended annual dental exams for proper oral hygiene  Community Resource Referral / Chronic Care Management: CRR required this visit?  No   CCM required this visit?  No   Plan:    I have personally reviewed and noted the following in the patient's chart:    Medical and social history Use of alcohol, tobacco or illicit drugs  Current medications and supplements including opioid prescriptions. Patient is not currently taking opioid prescriptions. Functional ability and status Nutritional status Physical activity Advanced directives List of other physicians Hospitalizations, surgeries, and ER visits in previous 12 months Vitals Screenings to include cognitive, depression, and falls Referrals and appointments  In addition, I have reviewed and discussed with patient certain preventive protocols, quality metrics, and best practice recommendations.  A written personalized care plan for preventive services as well as general preventive health recommendations were provided to patient.   Ardella FORBES Dawn, LPN   [id number]   After Visit Summary: (MyChart) Due to this being a telephonic visit, the after visit summary with patients personalized plan was offered to patient via MyChart   Notes: Nothing significant to report at this time.

## 2023-10-12 NOTE — Patient Instructions (Addendum)
 Standing Labs We placed an order today for your standing lab work.   Please have your standing labs drawn in November and every 3 months   Please have your labs drawn 2 weeks prior to your appointment so that the provider can discuss your lab results at your appointment, if possible.  Please note that you may see your imaging and lab results in MyChart before we have reviewed them. We will contact you once all results are reviewed. Please allow our office up to 72 hours to thoroughly review all of the results before contacting the office for clarification of your results.  WALK-IN LAB HOURS  Monday through Thursday from 8:00 am -12:30 pm and 1:00 pm-4:30 pm and Friday from 8:00 am-12:00 pm.  Patients with office visits requiring labs will be seen before walk-in labs.  You may encounter longer than normal wait times. Please allow additional time. Wait times may be shorter on  Monday and Thursday afternoons.  We do not book appointments for walk-in labs. We appreciate your patience and understanding with our staff.   Labs are drawn by [facility]. Please bring your co-pay at the time of your lab draw.  You may receive a bill from [facility] for your lab work.  Please note if you are on Hydroxychloroquine  and and an order has been placed for a Hydroxychloroquine  level,  you will need to have it drawn 4 hours or more after your last dose.  If you wish to have your labs drawn at another location, please call the office 24 hours in advance so we can fax the orders.  The office is located at [address], [place], [place] [address]   If you have any questions regarding directions or hours of operation,  please call [phone].   As a reminder, please drink plenty of water prior to coming for your lab work. Thanks!

## 2023-10-12 NOTE — Patient Instructions (Signed)
 [name],  Thank you for taking the time for your Medicare Wellness Visit. I appreciate your continued commitment to your health goals. Please review the care plan we discussed, and feel free to reach out if I can assist you further.  Medicare recommends these wellness visits once per year to help you and your care team stay ahead of potential health issues. These visits are designed to focus on prevention, allowing your provider to concentrate on managing your acute and chronic conditions during your regular appointments.  Please note that Annual Wellness Visits do not include a physical exam. Some assessments may be limited, especially if the visit was conducted virtually. If needed, we may recommend a separate in-person follow-up with your provider.  Ongoing Care Seeing your primary care provider every 3 to 6 months helps us  monitor your health and provide consistent, personalized care.   Referrals If a referral was made during today's visit and you haven't received any updates within two weeks, please contact the referred provider directly to check on the status.  Recommended Screenings:  Health Maintenance  Topic Date Due   Flu Shot  08/20/2023   COVID-19 Vaccine (8 - Pfizer risk 2024-25 season) 09/20/2023   Medicare Annual Wellness Visit  10/11/2024   Breast Cancer Screening  03/24/2025   DTaP/Tdap/Td vaccine (3 - Td or Tdap) 09/16/2027   Colon Cancer Screening  10/27/2027   DEXA scan (bone density measurement)  Completed   Hepatitis C Screening  Completed   Zoster (Shingles) Vaccine  Completed   HPV Vaccine  Aged Out   Meningitis B Vaccine  Aged Out   Pneumococcal Vaccine for age over 81  Discontinued       10/12/2023    8:41 AM  Advanced Directives  Does Patient Have a Medical Advance Directive? Yes  Type of Estate agent of [place];Living will  Copy of Healthcare Power of Attorney in Chart? Yes - validated most recent copy scanned in chart (See  row information)   Advance Care Planning is important because it: Ensures you receive medical care that aligns with your values, goals, and preferences. Provides guidance to your family and loved ones, reducing the emotional burden of decision-making during critical moments.  Vision: Annual vision screenings are recommended for early detection of glaucoma, cataracts, and diabetic retinopathy. These exams can also reveal signs of chronic conditions such as diabetes and high blood pressure.  Dental: Annual dental screenings help detect early signs of oral cancer, gum disease, and other conditions linked to overall health, including heart disease and diabetes.  Please see the attached documents for additional preventive care recommendations.

## 2023-10-13 ENCOUNTER — Ambulatory Visit: Admitting: Physician Assistant

## 2023-10-28 ENCOUNTER — Other Ambulatory Visit: Payer: Self-pay | Admitting: *Deleted

## 2023-10-28 MED ORDER — METHOTREXATE SODIUM CHEMO INJECTION 50 MG/2ML: INTRAMUSCULAR | 0 refills | Status: AC

## 2023-10-28 NOTE — Telephone Encounter (Signed)
 Refill request received via fax from [facility] for Methotrexate    Last Fill: 04/08/2023  Labs: 09/10/2023 WBC 12.8, RBC 3.86, Hgb 11.6, Hct 33.8, Platelet 557, Neutro Abs 9.9 09/15/2023 Glucose 118, BUN 6, Creat. 0.56, BUN/Creat. Ratio 11  Next Visit: 03/16/2024  Last Visit: 10/12/2023  DX: Psoriatic arthritis   Current Dose per office note 10/12/2023: Methotrexate  1 mL sq injections every 7 days   Okay to refill Methotrexate ?

## 2023-11-01 ENCOUNTER — Telehealth: Payer: Self-pay | Admitting: Family Medicine

## 2023-11-01 MED ORDER — SCOPOLAMINE 1 MG/3DAYS TD PT72: 1.0000 | MEDICATED_PATCH | TRANSDERMAL | 0 refills | Status: DC

## 2023-11-01 NOTE — Telephone Encounter (Signed)
 Copied from CRM [id number]. Topic: Clinical - Medication Refill >> Nov 01, 2023  1:58 PM Amber H wrote: Medication: Nausea patches that goes behind the ear (not sure of the name). Will be on cruise for 7 days.   Has the patient contacted their pharmacy? No, this is a new request. (Agent: If no, request that the patient contact the pharmacy for the refill. If patient does not wish to contact the pharmacy document the reason why and proceed with request.) (Agent: If yes, when and what did the pharmacy advise?)  This is the patient's preferred pharmacy:  [id number] - [place], [place] - [address] [place] [address] Phone: [phone] Fax: [phone]   Is this the correct pharmacy for this prescription? Yes If no, delete pharmacy and type the correct one.   Has the prescription been filled recently? No  Is the patient out of the medication? No- Has never had this medication   Has the patient been seen for an appointment in the last year OR does the patient have an upcoming appointment? Yes  Can we respond through MyChart? Yes  Agent: Please be advised that Rx refills may take up to 3 business days. We ask that you follow-up with your pharmacy.

## 2023-11-01 NOTE — Telephone Encounter (Signed)
 Nausea patches not on current list.

## 2023-11-01 NOTE — Telephone Encounter (Signed)
 Patient going on cruise October 31st and would like Nausea Patches for the trip. Wants it sent to [facility] on [address]

## 2023-11-08 ENCOUNTER — Other Ambulatory Visit: Payer: Self-pay

## 2023-11-08 MED ORDER — FOLIC ACID 1 MG PO TABS: 2.0000 mg | ORAL_TABLET | Freq: Every day | ORAL | 3 refills | Status: AC

## 2023-11-08 NOTE — Telephone Encounter (Signed)
 Last Fill: 11/10/2022  Next Visit: 03/16/2024  Last Visit: 10/12/2023  DX: Psoriatic arthritis   Current Dose per office note on 10/12/2023: folic acid  1 mg 2 tablets daily.   Okay to refill folic acid ?

## 2023-11-11 ENCOUNTER — Telehealth: Payer: Self-pay | Admitting: Internal Medicine

## 2023-11-11 ENCOUNTER — Telehealth: Payer: Self-pay

## 2023-11-11 ENCOUNTER — Other Ambulatory Visit: Payer: Self-pay | Admitting: Physician Assistant

## 2023-11-11 NOTE — Telephone Encounter (Signed)
 Copied from CRM [id number]. Topic: Clinical - Medical Advice >> Nov 11, 2023  1:09 PM Fonda T wrote: Reason for CRM: Patient calling to inquire when last tetanus shot was given.  Per patient states she is leaving [place] a trip [place]f the country, and a tetanus shot was recommended by her pharmacist.  Patient can be reached to inform when last tetanus was given.  Can be reached at [phone].  Patient is aware of same day call back.

## 2023-11-11 NOTE — Telephone Encounter (Signed)
 Last Fill: 04/13/2022  Next Visit: 03/16/2024  Last Visit: 10/12/2023  Dx: For patient's methotrexate    Current Dose per office note on 10/12/2023: For Patient's Methotrexate   Okay to refill Syringes?

## 2023-11-11 NOTE — Telephone Encounter (Signed)
  Pt. Aware last T-dap was 2019 so it is not due until 2029.  Copied from CRM [id number]. Topic: Clinical - Medical Advice >> Nov 11, 2023  1:09 PM Fonda T wrote: Reason for CRM: Patient calling to inquire when last tetanus shot was given.  Per patient states she is [place] country, and a tetanus shot was recommended by her pharmacist.  Patient can be reached to inform when last tetanus was given.  Can be reached at [phone].  Patient is aware of same day call back.

## 2023-12-06 ENCOUNTER — Observation Stay (HOSPITAL_COMMUNITY)
Admission: EM | Admit: 2023-12-06 | Discharge: 2023-12-07 | Disposition: A | Discharge status: Home / Self Care | Attending: Student | Admitting: Student

## 2023-12-06 ENCOUNTER — Encounter (HOSPITAL_COMMUNITY): Payer: Self-pay

## 2023-12-06 ENCOUNTER — Emergency Department (HOSPITAL_COMMUNITY)

## 2023-12-06 ENCOUNTER — Other Ambulatory Visit: Payer: Self-pay

## 2023-12-06 DIAGNOSIS — D649 Anemia, unspecified: Secondary | ICD-10-CM | POA: Diagnosis not present

## 2023-12-06 DIAGNOSIS — E039 Hypothyroidism, unspecified: Secondary | ICD-10-CM | POA: Diagnosis present

## 2023-12-06 DIAGNOSIS — L4050 Arthropathic psoriasis, unspecified: Secondary | ICD-10-CM | POA: Diagnosis present

## 2023-12-06 DIAGNOSIS — Z79899 Other long term (current) drug therapy: Secondary | ICD-10-CM | POA: Insufficient documentation

## 2023-12-06 DIAGNOSIS — R0602 Shortness of breath: Secondary | ICD-10-CM | POA: Diagnosis present

## 2023-12-06 DIAGNOSIS — K219 Gastro-esophageal reflux disease without esophagitis: Secondary | ICD-10-CM | POA: Diagnosis present

## 2023-12-06 DIAGNOSIS — I959 Hypotension, unspecified: Secondary | ICD-10-CM | POA: Insufficient documentation

## 2023-12-06 DIAGNOSIS — E785 Hyperlipidemia, unspecified: Secondary | ICD-10-CM | POA: Diagnosis not present

## 2023-12-06 DIAGNOSIS — I10 Essential (primary) hypertension: Secondary | ICD-10-CM | POA: Diagnosis not present

## 2023-12-06 DIAGNOSIS — L4052 Psoriatic arthritis mutilans: Secondary | ICD-10-CM | POA: Insufficient documentation

## 2023-12-06 DIAGNOSIS — F17210 Nicotine dependence, cigarettes, uncomplicated: Secondary | ICD-10-CM | POA: Diagnosis not present

## 2023-12-06 DIAGNOSIS — T782XXA Anaphylactic shock, unspecified, initial encounter: Secondary | ICD-10-CM | POA: Diagnosis not present

## 2023-12-06 LAB — CBC
HCT: 39.8 % (ref 36.0–46.0)
Hemoglobin: 13.1 g/dL (ref 12.0–15.0)
MCH: 30.3 pg (ref 26.0–34.0)
MCHC: 32.9 g/dL (ref 30.0–36.0)
MCV: 91.9 fL (ref 80.0–100.0)
Platelets: 405 K/uL — ABNORMAL HIGH (ref 150–400)
RBC: 4.33 MIL/uL (ref 3.87–5.11)
RDW: 12.9 % (ref 11.5–15.5)
WBC: 10.3 K/uL (ref 4.0–10.5)
nRBC: 0 % (ref 0.0–0.2)

## 2023-12-06 LAB — COMPREHENSIVE METABOLIC PANEL WITH GFR
ALT: 16 U/L (ref 0–44)
AST: 24 U/L (ref 15–41)
Albumin: 4.4 g/dL (ref 3.5–5.0)
Alkaline Phosphatase: 104 U/L (ref 38–126)
Anion gap: 10 (ref 5–15)
BUN: <5 mg/dL — ABNORMAL LOW (ref 8–23)
CO2: 25 mmol/L (ref 22–32)
Calcium: 9.6 mg/dL (ref 8.9–10.3)
Chloride: 103 mmol/L (ref 98–111)
Creatinine, Ser: 0.5 mg/dL (ref 0.44–1.00)
GFR, Estimated: >60 mL/min (ref >60)
Glucose, Bld: 98 mg/dL (ref 70–99)
Potassium: 3.8 mmol/L (ref 3.5–5.1)
Sodium: 139 mmol/L (ref 135–145)
Total Bilirubin: 0.3 mg/dL (ref 0.0–1.2)
Total Protein: 7.6 g/dL (ref 6.5–8.1)

## 2023-12-06 MED ORDER — DEXAMETHASONE SOD PHOSPHATE PF 10 MG/ML IJ SOLN
10.0000 mg | Freq: Once | INTRAMUSCULAR | Status: AC
  Administered 2023-12-06: 10 mg via INTRAVENOUS

## 2023-12-06 MED ORDER — FAMOTIDINE IN NACL 20-0.9 MG/50ML-% IV SOLN
20.0000 mg | Freq: Once | INTRAVENOUS | Status: AC
  Administered 2023-12-06: 20 mg via INTRAVENOUS
  Filled 2023-12-06: qty 50

## 2023-12-06 MED ORDER — SODIUM CHLORIDE 0.9 % IV BOLUS
1000.0000 mL | Freq: Once | INTRAVENOUS | Status: AC
  Administered 2023-12-07: 1000 mL via INTRAVENOUS

## 2023-12-06 MED ORDER — EPINEPHRINE 0.3 MG/0.3ML IJ SOAJ
0.3000 mg | Freq: Once | INTRAMUSCULAR | Status: AC
  Administered 2023-12-06: 0.3 mg via INTRAMUSCULAR
  Filled 2023-12-06: qty 0.3

## 2023-12-06 MED ORDER — DIPHENHYDRAMINE HCL 50 MG/ML IJ SOLN
25.0000 mg | Freq: Once | INTRAMUSCULAR | Status: AC
  Administered 2023-12-06: 25 mg via INTRAVENOUS
  Filled 2023-12-06: qty 1

## 2023-12-06 NOTE — ED Provider Notes (Incomplete)
 [place] EMERGENCY DEPARTMENT AT [facility] Provider Note   CSN: [id number] Arrival date & time: 12/06/23  2035     Patient presents with: Leg Swelling   April Cox is a 68 y.o. female who presents to the emergency department with a chief complaint of left thigh swelling and possible allergic reaction.  Approximately 1 hour ago patient states that she was sitting on her back porch whenever she felt something crawling on her left thigh, patient states that she swatted the insect away and is unsure if the insect bit or stung her and is unsure what the insect was.  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  Patient appreciates shortness of breath in the triage area of the emergency department.  Denies chest pain, nausea, vomiting, abdominal pain. Patient does have prescribed epi-pen at home however did not use it today because shortness of breath did not start until arriving to the ED. past medical history significant for hypothyroidism, hyperlipidemia, tobacco use, hypertension, osteoporosis, GERD, allergic reactions, etc.  {Add pertinent medical, surgical, social history, OB history to HPI:32947} HPI     Prior to Admission medications   Medication Sig Start Date End Date Taking? Authorizing Provider  atorvastatin  (LIPITOR) 40 MG tablet Take 1 tablet (40 mg total) by mouth daily. 04/28/23   [name], MD  calcium -vitamin D  (OSCAL WITH D) 500-200 MG-UNIT per tablet Take 1 tablet by mouth daily.    [provider]  Cholecalciferol (VITAMIN D  PO) Take 500 mg by mouth daily.     [provider]  CRANBERRY PO Take by mouth daily.    [provider]  EPINEPHrine  0.3 mg/0.3 mL IJ SOAJ injection INJECT INTO THE MIDDLE OF THE OUTER THIGH AND HOLD FOR 10 SECONDS AS NEEDED FOR SEVERE ALLERGIC REACTION THEN CALL 911 IF USED 12/29/22   [name], MD  Estradiol   10 MCG TABS vaginal tablet Place vaginally. 05/19/22   [provider]  folic acid  (FOLVITE ) 1 MG tablet Take 2 tablets (2 mg total) by mouth daily. 11/08/23   Cheryl Waddell HERO, PA-C  levothyroxine  (SYNTHROID ) 25 MCG tablet TAKE 1 TABLET BY MOUTH DAILY BEFORE BREAKFAST 04/28/23   [name], MD  methotrexate  50 MG/2ML injection INJECT 1 MILLILITER (CC) UNDER THE SKIN ONCE WEEKLY 10/28/23   [name], MD  metoprolol  succinate (TOPROL -XL) 50 MG 24 hr tablet TAKE 1 TABLET BY MOUTH DAILY IMMEDIATELY FOLLOWING A MEAL 04/28/23   [name], MD  MONOJECT 1CC TB SYR 27GX1/2 27G X 1/2 1 ML MISC USE AS DIRECTED 11/12/23   [name], MD  Multiple Vitamins-Minerals (PRESERVISION AREDS PO) Take by mouth daily. Take one daily    [provider]  pantoprazole  (PROTONIX ) 40 MG tablet TAKE 1 TABLET BY MOUTH DAILY 09/29/23   Lalonde, John C, MD  Polyvinyl Alcohol-Povidone (REFRESH OP) Apply to eye as needed.    [provider]  potassium chloride  SA (KLOR-CON  M) 20 MEQ tablet Take 1 tablet (20 mEq total) by mouth 2 (two) times daily for 5 days. Patient not taking: Reported on 10/12/2023 09/22/23 10/12/23  [name], MD  scopolamine  (TRANSDERM-SCOP) 1 MG/3DAYS Place 1 patch (1 mg total) onto the skin every 3 (three) days. 11/01/23   [name], MD  sulfamethoxazole -trimethoprim  (BACTRIM  DS) 800-160 MG tablet Take 1 tablet by mouth 2 (two) times daily. Patient not taking: Reported on  10/12/2023 09/07/23   [name], MD  VEVYE 0.1 % SOLN Apply 1 drop to eye 2 (two) times daily.    [provider]  vitamin C (ASCORBIC ACID) 250 MG tablet Take 250 mg by mouth daily.    [provider]  Zoledronic  Acid (RECLAST  IV) Inject into the vein. First IV: 12/2021, most recent IV: 12/2022 Patient not taking: Reported on 10/12/2023    [provider]    Allergies: Bee venom, Codeine, Other, Penicillin g, Penicillins, and Gluten meal    Review of  Systems  Respiratory:  Positive for shortness of breath.     Updated Vital Signs BP 115/64   Pulse 76   Temp 97.7 F (36.5 C) (Oral)   Resp 19   SpO2 100%   Physical Exam Vitals and nursing note reviewed.  Constitutional:      General: She is awake. She is not in acute distress.    Appearance: Normal appearance. She is not ill-appearing, toxic-appearing or diaphoretic.  Eyes:     General: No scleral icterus. Cardiovascular:     Rate and Rhythm: Normal rate and regular rhythm.  Pulmonary:     Effort: Pulmonary effort is normal. No respiratory distress.     Breath sounds: No stridor. No wheezing, rhonchi or rales.     Comments: Patient talking in full sentences on room air, no drooling, no tripoding Musculoskeletal:     Right lower leg: No edema.     Left lower leg: No edema.  Skin:    General: Skin is warm.     Capillary Refill: Capillary refill takes less than 2 seconds.  Neurological:     General: No focal deficit present.     Mental Status: She is alert and oriented to person, place, and time.  Psychiatric:        Mood and Affect: Mood normal.        Behavior: Behavior normal. Behavior is cooperative.      (all labs ordered are listed, but only abnormal results are displayed) Labs Reviewed  CBC  COMPREHENSIVE METABOLIC PANEL WITH GFR    EKG: None  Radiology: No results found.  {Document cardiac monitor, telemetry assessment procedure when appropriate:32947} Procedures   Medications Ordered in the ED  diphenhydrAMINE  (BENADRYL ) injection 25 mg (has no administration in time range)  EPINEPHrine  (EPI-PEN) injection 0.3 mg (has no administration in time range)  dexamethasone  (DECADRON ) injection 10 mg (has no administration in time range)  famotidine  (PEPCID ) IVPB 20 mg premix (has no administration in time range)      {Click here for ABCD2, HEART and other calculators REFRESH Note before signing:1}                              Medical Decision  Making Amount and/or Complexity of Data Reviewed Labs: ordered. Radiology: ordered.  Risk Prescription drug management.     Patient presents to the ED for concern of ***, this involves an extensive number of treatment options, and is a complaint that carries with it a high risk of complications and morbidity.  The differential diagnosis includes ***   Co morbidities that complicate the patient evaluation  ***   Additional history obtained:  Additional history obtained from *** {Blank multiple:19196::EMS,Family,Nursing,Outside Medical Records,Past Admission}   External records from outside source obtained and reviewed including ***   Lab Tests:  I Ordered, and personally interpreted labs.  The pertinent results include:  ***  Imaging Studies ordered:  I ordered imaging studies including ***  I independently visualized and interpreted imaging which showed *** I agree with the radiologist interpretation   Cardiac Monitoring:  The patient was maintained on a cardiac monitor.  I personally viewed and interpreted the cardiac monitored which showed an underlying rhythm of: ***   Medicines ordered and prescription drug management:  I ordered medication including ***  for ***  Reevaluation of the patient after these medicines showed that the patient {resolved/improved/worsened:23923::improved} I have reviewed the patients home medicines and have made adjustments as needed   Test Considered:  ***   Critical Interventions:  ***   Consultations Obtained:  I requested consultation with the ***,  and discussed lab and imaging findings as well as pertinent plan - they recommend: ***   Problem List / ED Course:  68 year old female, vital signs stable, possible allergic reaction, patient unsure if she was stung or bitten by an insect, 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 , had not taken EpiPen  at time of arrival to the  emergency department On physical exam significant swelling present to left lateral thigh, approximately baseball size firm swollen area present, no obvious redness or erythema, area is tender to touch, no obvious puncture wounds or sting site, no obvious abnormality with auscultation of lungs or heart in triage however patient states that she is having some shortness of breath Plan to treat for symptomatic allergic reaction, will give epinephrine  as patient states that she is short of breath in triage, will monitor for at least 4 hours since giving epi  During observation period patient became hypotensive however still stable, fluids ordered and patient admitted    Reevaluation:  After the interventions noted above, I reevaluated the patient and found that they have :{resolved/improved/worsened:23923::improved}   Social Determinants of Health:  ***   Dispostion:  After consideration of the diagnostic results and the patients response to treatment, I feel that the patent would benefit from ***.    {Document critical care time when appropriate  Document review of labs and clinical decision tools ie CHADS2VASC2, etc  Document your independent review of radiology images and any outside records  Document your discussion with family members, caretakers and with consultants  Document social determinants of health affecting pt's care  Document your decision making why or why not admission, treatments were needed:32947:::1}   Final diagnoses:  None    ED Discharge Orders     None

## 2023-12-06 NOTE — ED Provider Notes (Incomplete)
 [place] EMERGENCY DEPARTMENT AT [facility] Provider Note   CSN: [id number] Arrival date & time: 12/06/23  2035     Patient presents with: Leg Swelling   April Cox is a 68 y.o. female who presents to the emergency department with a chief complaint of left thigh swelling and possible allergic reaction.  Approximately 1 hour ago patient states that she was sitting on her front porch whenever she felt something crawling on her left thigh, patient states that she swatted what she believes to be an insect away and is unsure if the insect bit or stung her and is unsure what the insect was.  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  Patient appreciates shortness of breath in the triage area of the emergency department.  Denies chest pain, nausea, vomiting, abdominal pain. Patient does have prescribed epi-pen at home however did not use it today because shortness of breath did not start until arriving to the ED. Past medical history significant for hypothyroidism, hyperlipidemia, tobacco use, hypertension, osteoporosis, GERD, allergic reactions, etc.   HPI     Prior to Admission medications   Medication Sig Start Date End Date Taking? Authorizing Provider  atorvastatin  (LIPITOR) 40 MG tablet Take 1 tablet (40 mg total) by mouth daily. 04/28/23   [name], MD  calcium -vitamin D  (OSCAL WITH D) 500-200 MG-UNIT per tablet Take 1 tablet by mouth daily.    [provider]  Cholecalciferol (VITAMIN D  PO) Take 500 mg by mouth daily.     [provider]  CRANBERRY PO Take by mouth daily.    [provider]  EPINEPHrine  0.3 mg/0.3 mL IJ SOAJ injection INJECT INTO THE MIDDLE OF THE OUTER THIGH AND HOLD FOR 10 SECONDS AS NEEDED FOR SEVERE ALLERGIC REACTION THEN CALL 911 IF USED 12/29/22   [name], MD  Estradiol  10 MCG TABS vaginal tablet Place vaginally.  05/19/22   [provider]  folic acid  (FOLVITE ) 1 MG tablet Take 2 tablets (2 mg total) by mouth daily. 11/08/23   Cheryl Waddell HERO, PA-C  levothyroxine  (SYNTHROID ) 25 MCG tablet TAKE 1 TABLET BY MOUTH DAILY BEFORE BREAKFAST 04/28/23   [name], MD  methotrexate  50 MG/2ML injection INJECT 1 MILLILITER (CC) UNDER THE SKIN ONCE WEEKLY 10/28/23   [name], MD  metoprolol  succinate (TOPROL -XL) 50 MG 24 hr tablet TAKE 1 TABLET BY MOUTH DAILY IMMEDIATELY FOLLOWING A MEAL 04/28/23   [name], MD  MONOJECT 1CC TB SYR 27GX1/2 27G X 1/2 1 ML MISC USE AS DIRECTED 11/12/23   [name], MD  Multiple Vitamins-Minerals (PRESERVISION AREDS PO) Take by mouth daily. Take one daily    [provider]  pantoprazole  (PROTONIX ) 40 MG tablet TAKE 1 TABLET BY MOUTH DAILY 09/29/23   Lalonde, John C, MD  Polyvinyl Alcohol-Povidone (REFRESH OP) Apply to eye as needed.    [provider]  potassium chloride  SA (KLOR-CON  M) 20 MEQ tablet Take 1 tablet (20 mEq total) by mouth 2 (two) times daily for 5 days. Patient not taking: Reported on 10/12/2023 09/22/23 10/12/23  [name], MD  scopolamine  (TRANSDERM-SCOP) 1 MG/3DAYS Place 1 patch (1 mg total) onto the skin every 3 (three) days. 11/01/23   [name], MD  sulfamethoxazole -trimethoprim  (BACTRIM  DS) 800-160 MG tablet Take 1 tablet by mouth 2 (two) times daily. Patient not taking: Reported on 10/12/2023 09/07/23  Lalonde, John C, MD  VEVYE 0.1 % SOLN Apply 1 drop to eye 2 (two) times daily.    [provider]  vitamin C (ASCORBIC ACID) 250 MG tablet Take 250 mg by mouth daily.    [provider]  Zoledronic  Acid (RECLAST  IV) Inject into the vein. First IV: 12/2021, most recent IV: 12/2022 Patient not taking: Reported on 10/12/2023    [provider]    Allergies: Bee venom, Codeine, Other, Penicillin g, Penicillins, and Gluten meal    Review of Systems  Respiratory:  Positive for  shortness of breath.     Updated Vital Signs BP (!) 104/55   Pulse 75   Temp (!) 97.5 F (36.4 C) (Oral)   Resp 16   SpO2 97%   Physical Exam Vitals and nursing note reviewed.  Constitutional:      General: She is awake. She is not in acute distress.    Appearance: Normal appearance. She is not ill-appearing, toxic-appearing or diaphoretic.  Eyes:     General: No scleral icterus. Cardiovascular:     Rate and Rhythm: Normal rate and regular rhythm.  Pulmonary:     Effort: Pulmonary effort is normal. No respiratory distress.     Breath sounds: No stridor. No wheezing, rhonchi or rales.     Comments: Patient talking in full sentences on room air, no drooling, no tripoding Musculoskeletal:     Right lower leg: No edema.     Left lower leg: No edema.     Comments: Patient ambulatory without assistance  Skin:    General: Skin is warm.     Capillary Refill: Capillary refill takes less than 2 seconds.     Comments: Large tender swollen area present to left lateral thigh with developing bruising, no appreciable erythema, open wounds, or puncture marks   Neurological:     General: No focal deficit present.     Mental Status: She is alert and oriented to person, place, and time.  Psychiatric:        Mood and Affect: Mood normal.        Behavior: Behavior normal. Behavior is cooperative.      (all labs ordered are listed, but only abnormal results are displayed) Labs Reviewed  CBC - Abnormal; Notable for the following components:      Result Value   Platelets 405 (*)    All other components within normal limits  COMPREHENSIVE METABOLIC PANEL WITH GFR - Abnormal; Notable for the following components:   BUN <5 (*)    All other components within normal limits  HIV ANTIBODY (ROUTINE TESTING W REFLEX)  MAGNESIUM   BASIC METABOLIC PANEL WITH GFR  CBC    EKG: None  Radiology: [facility] Chest Port 1 View Result Date: 12/06/2023 CLINICAL DATA:  Shortness of breath. EXAM: PORTABLE  CHEST 1 VIEW COMPARISON:  September 10, 2023 FINDINGS: The heart size and mediastinal contours are within normal limits. Both lungs are clear. No acute osseous abnormalities are identified. IMPRESSION: No active disease. Electronically Signed   By: Suzen Dials M.D.   On: 12/06/2023 22:21     .Critical Care  Performed by: Janetta Terrall FALCON, PA-C Authorized by: Janetta Terrall FALCON, PA-C   Critical care provider statement:    Critical care time (minutes):  30   Critical care was necessary to treat or prevent imminent or life-threatening deterioration of the following conditions:  Respiratory failure   Critical care was time spent personally by me on the following activities:  Development of treatment plan with patient or surrogate, blood draw for specimens, discussions with consultants, evaluation of patient's response to treatment, examination of patient, obtaining history from patient or surrogate, ordering and performing treatments and interventions, ordering and review of laboratory studies, ordering and review of radiographic studies, pulse oximetry and re-evaluation of patient's condition   Care discussed with: admitting provider      Medications Ordered in the ED  sodium chloride  flush (NS) 0.9 % injection 3 mL (has no administration in time range)  acetaminophen  (TYLENOL ) tablet 650 mg (has no administration in time range)    Or  acetaminophen  (TYLENOL ) suppository 650 mg (has no administration in time range)  senna-docusate (Senokot-S) tablet 1 tablet (has no administration in time range)  enoxaparin  (LOVENOX ) injection 40 mg (has no administration in time range)  ipratropium-albuterol (DUONEB) 0.5-2.5 (3) MG/3ML nebulizer solution 3 mL (has no administration in time range)  diphenhydrAMINE  (BENADRYL ) injection 25 mg (25 mg Intravenous Given 12/06/23 2129)  EPINEPHrine  (EPI-PEN) injection 0.3 mg (0.3 mg Intramuscular Given 12/06/23 2130)  dexamethasone  (DECADRON ) injection 10 mg (10  mg Intravenous Given 12/06/23 2129)  famotidine  (PEPCID ) IVPB 20 mg premix (0 mg Intravenous Stopped 12/06/23 2320)  sodium chloride  0.9 % bolus 1,000 mL (1,000 mLs Intravenous New Bag/Given 12/07/23 0002)                                    Medical Decision Making Amount and/or Complexity of Data Reviewed Labs: ordered. Radiology: ordered.  Risk Prescription drug management. Decision regarding hospitalization.   Patient presents to the ED for concern of allergic reaction, possibly anaphylaxis, this involves an extensive number of treatment options, and is a complaint that carries with it a high risk of complications and morbidity.  The differential diagnosis includes allergic reaction, anaphylaxis, insect bite/sting, hives, etc.   Co morbidities that complicate the patient evaluation  hypothyroidism, hyperlipidemia, tobacco use, hypertension, osteoporosis, GERD, allergic reactions   Lab Tests:  I Ordered, and personally interpreted labs.  The pertinent results include: CBC unremarkable, CMP unremarkable   Imaging Studies ordered:  I ordered imaging studies including chest x-ray I independently visualized and interpreted imaging which showed no active disease I agree with the radiologist interpretation   Cardiac Monitoring:  The patient was maintained on a cardiac monitor.  I personally viewed and interpreted the cardiac monitored which showed an underlying rhythm of: Sinus rhythm   Medicines ordered and prescription drug management:  I ordered medication including epinephrine , Pepcid , Decadron , Benadryl  for suspected anaphylactic reaction Reevaluation of the patient after these medicines showed that the patient improved I have reviewed the patients home medicines and have made adjustments as needed   Test Considered:  None   Critical Interventions:  Epi given due to anaphylaxis   Consultations Obtained:  I requested consultation with the hospitalist team  and discussed lab and imaging findings as well as pertinent plan - they recommend: Admission to the hospital for ongoing diagnosis and treatment   Problem List / ED Course:  68 year old female, vital signs stable, possible allergic reaction, patient unsure if she was stung or bitten by an insect, 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 , had not taken EpiPen  at time of arrival to the emergency department, I initially evaluated the patient in triage  On physical exam significant swelling present to left lateral thigh, approximately baseball size firm swollen area present, no obvious  redness or erythema, area is tender to touch, no obvious puncture wounds or sting site, no obvious abnormality with auscultation of lungs or heart in triage however patient states that she is having some shortness of breath Plan to treat for symptomatic allergic reaction, will give epinephrine  as patient states that she is short of breath in triage, will also give steroids, benadryl , and famotidine  On reassessment patient feeling much improved, denies shortness of breath or chest tightness During observation period patient became hypotensive however still stable, fluids ordered and patient ultimately admitted due to hypotension in setting of anaphylaxis Spoke with Dr. Fernand with the hospitalist team Throughout stay patient remained stable, alert, and oriented Most likely diagnosis at time of admission is hypotension in the setting of anaphylaxis   Reevaluation:  After the interventions noted above, I reevaluated the patient and found that they have :improved   Social Determinants of Health:  none   Dispostion:  After consideration of the diagnostic results and the patients response to treatment, I feel that the patient would benefit from admission to the hospital for ongoing diagnosis and treatment.       Final diagnoses:  Shortness of breath  Anaphylaxis, initial encounter   Hypotension, unspecified hypotension type    ED Discharge Orders     None          Janetta Terrall FALCON, PA-C 12/07/23 0105    Tyonna Talerico F, PA-C 12/07/23 0119    Randol Simmonds, MD 12/07/23 1057

## 2023-12-06 NOTE — ED Notes (Signed)
 Patient placed on 2L [place] as her Spo2 dropped while sleeping. While sleeping 84% on Room Air. Currently at 95% on 2L [place]

## 2023-12-06 NOTE — ED Triage Notes (Signed)
 Pt has swelling to her left upper thigh since this evening. Pt reports feeling something crawling on her leg and she brushed it off. Pt reports feeling shob and reports being allergic to bees.

## 2023-12-07 ENCOUNTER — Encounter (HOSPITAL_COMMUNITY): Payer: Self-pay

## 2023-12-07 DIAGNOSIS — T782XXA Anaphylactic shock, unspecified, initial encounter: Principal | ICD-10-CM

## 2023-12-07 DIAGNOSIS — E038 Other specified hypothyroidism: Secondary | ICD-10-CM

## 2023-12-07 DIAGNOSIS — I10 Essential (primary) hypertension: Secondary | ICD-10-CM

## 2023-12-07 DIAGNOSIS — L4050 Arthropathic psoriasis, unspecified: Secondary | ICD-10-CM

## 2023-12-07 LAB — BASIC METABOLIC PANEL WITH GFR
Anion gap: 12 (ref 5–15)
BUN: <5 mg/dL — ABNORMAL LOW (ref 8–23)
CO2: 22 mmol/L (ref 22–32)
Calcium: 8.8 mg/dL — ABNORMAL LOW (ref 8.9–10.3)
Chloride: 108 mmol/L (ref 98–111)
Creatinine, Ser: 0.47 mg/dL (ref 0.44–1.00)
GFR, Estimated: >60 mL/min (ref >60)
Glucose, Bld: 131 mg/dL — ABNORMAL HIGH (ref 70–99)
Potassium: 3.7 mmol/L (ref 3.5–5.1)
Sodium: 142 mmol/L (ref 135–145)

## 2023-12-07 LAB — CBC
HCT: 34.9 % — ABNORMAL LOW (ref 36.0–46.0)
Hemoglobin: 11.6 g/dL — ABNORMAL LOW (ref 12.0–15.0)
MCH: 30.5 pg (ref 26.0–34.0)
MCHC: 33.2 g/dL (ref 30.0–36.0)
MCV: 91.8 fL (ref 80.0–100.0)
Platelets: 332 K/uL (ref 150–400)
RBC: 3.8 MIL/uL — ABNORMAL LOW (ref 3.87–5.11)
RDW: 13.2 % (ref 11.5–15.5)
WBC: 8.1 K/uL (ref 4.0–10.5)
nRBC: 0 % (ref 0.0–0.2)

## 2023-12-07 LAB — MAGNESIUM: Magnesium: 1.6 mg/dL — ABNORMAL LOW (ref 1.7–2.4)

## 2023-12-07 MED ORDER — ACETAMINOPHEN 325 MG PO TABS: 650.0000 mg | ORAL_TABLET | Freq: Four times a day (QID) | ORAL | Status: DC | PRN

## 2023-12-07 MED ORDER — MAGNESIUM SULFATE 2 GM/50ML IV SOLN: 2.0000 g | Freq: Once | INTRAVENOUS | Status: DC

## 2023-12-07 MED ORDER — EPINEPHRINE 0.3 MG/0.3ML IJ SOAJ
0.3000 mg | Freq: Once | INTRAMUSCULAR | Status: AC
  Administered 2023-12-07: 0.3 mg via INTRAMUSCULAR
  Filled 2023-12-07: qty 0.3

## 2023-12-07 MED ORDER — PANTOPRAZOLE SODIUM 40 MG PO TBEC: 40.0000 mg | DELAYED_RELEASE_TABLET | Freq: Every day | ORAL | Status: DC

## 2023-12-07 MED ORDER — ACETAMINOPHEN 650 MG RE SUPP
650.0000 mg | Freq: Four times a day (QID) | RECTAL | Status: DC | PRN
Start: 2023-12-07 — End: 2023-12-07

## 2023-12-07 MED ORDER — PRESERVISION AREDS PO CAPS: ORAL_CAPSULE | Freq: Every day | ORAL | Status: DC

## 2023-12-07 MED ORDER — SODIUM CHLORIDE 0.9% FLUSH
3.0000 mL | Freq: Two times a day (BID) | INTRAVENOUS | Status: DC
  Administered 2023-12-07: 3 mL via INTRAVENOUS

## 2023-12-07 MED ORDER — ATORVASTATIN CALCIUM 40 MG PO TABS: 40.0000 mg | ORAL_TABLET | Freq: Every day | ORAL | Status: DC

## 2023-12-07 MED ORDER — LACTATED RINGERS IV BOLUS
500.0000 mL | Freq: Once | INTRAVENOUS | Status: AC
  Administered 2023-12-07: 500 mL via INTRAVENOUS

## 2023-12-07 MED ORDER — LEVOTHYROXINE SODIUM 25 MCG PO TABS
25.0000 ug | ORAL_TABLET | Freq: Every day | ORAL | Status: DC
  Administered 2023-12-07: 25 ug via ORAL
  Filled 2023-12-07: qty 1

## 2023-12-07 MED ORDER — IPRATROPIUM-ALBUTEROL 0.5-2.5 (3) MG/3ML IN SOLN: 3.0000 mL | Freq: Four times a day (QID) | RESPIRATORY_TRACT | Status: DC | PRN

## 2023-12-07 MED ORDER — CEPHALEXIN 500 MG PO CAPS
500.0000 mg | ORAL_CAPSULE | Freq: Four times a day (QID) | ORAL | Status: DC
  Administered 2023-12-07: 500 mg via ORAL
  Filled 2023-12-07: qty 1

## 2023-12-07 MED ORDER — PROSIGHT PO TABS
1.0000 | ORAL_TABLET | Freq: Every day | ORAL | Status: DC
  Filled 2023-12-07: qty 1

## 2023-12-07 MED ORDER — SENNOSIDES-DOCUSATE SODIUM 8.6-50 MG PO TABS: 1.0000 | ORAL_TABLET | Freq: Every evening | ORAL | Status: DC | PRN

## 2023-12-07 MED ORDER — VITAMIN D 50 MCG (2000 UT) PO TABS: 1000.0000 [IU] | ORAL_TABLET | Freq: Every day | ORAL | Status: AC

## 2023-12-07 MED ORDER — FOLIC ACID 1 MG PO TABS: 2.0000 mg | ORAL_TABLET | Freq: Every day | ORAL | Status: DC

## 2023-12-07 MED ORDER — OYSTER SHELL CALCIUM/D3 500-5 MG-MCG PO TABS
1.0000 | ORAL_TABLET | Freq: Every day | ORAL | Status: DC
  Filled 2023-12-07: qty 1

## 2023-12-07 MED ORDER — DOXYCYCLINE HYCLATE 100 MG PO CAPS: 100.0000 mg | ORAL_CAPSULE | Freq: Two times a day (BID) | ORAL | 0 refills | Status: AC

## 2023-12-07 MED ORDER — ENOXAPARIN SODIUM 40 MG/0.4ML IJ SOSY: 40.0000 mg | PREFILLED_SYRINGE | INTRAMUSCULAR | Status: DC

## 2023-12-07 NOTE — ED Notes (Signed)
 Per MD  dc.  Pt will take meds at home

## 2023-12-07 NOTE — ED Notes (Signed)
Patient ambulated to the bathroom with assistance.

## 2023-12-07 NOTE — Discharge Summary (Signed)
 Physician Discharge Summary  April Cox FMW:[id number] DOB: 05/17/1955 DOA: 12/06/2023  PCP: April Norleen BROCKS, MD  Admit date: 12/06/2023 Discharge date: 12/07/23  Admitted From: Home. Disposition: Home Recommendations for Outpatient Follow-up:  Outpatient follow-up with PCP in 1 to 2 weeks or sooner Check CMP and CBC at follow-up Reassess left eye swelling and inflammation at follow-up Please follow up on the following pending results: None  Home Health: No need identified Equipment/Devices: No need identified  Discharge Condition: Stable CODE STATUS: Full code Diet Orders (From admission, onward)     Start     Ordered   12/07/23 0103  Diet regular Room service appropriate? Yes; Fluid consistency: Thin  Diet effective now       Question Answer Comment  Room service appropriate? Yes   Fluid consistency: Thin      12/07/23 0103             Follow-up Information     April Norleen BROCKS, MD. Schedule an appointment as soon as possible for a visit in 1 week(s).   Specialty: Family Medicine Contact information: [address] [place] [address] [phone]                 Hospital course 68 year old F with PMH of HTN, HLD, psoriatic arthritis, osteoporosis, hypothyroidism, tobacco use disorder and GERD presented to ED with localized left thigh swelling, shortness of breath and chest tightness after she had an insect bite.  Pt states she was sitting on her front porch and felt as there was an insect that was crawling on her and she tried to deflect it and then came inside to use the bathroom and noticed a large bump on her left thigh that was not previously present.  She was previously told if she suspected an insect bite to present to the ED as the EpiPen  may not be enough in that case.    In ED, hemodynamically stable.  Labs and CXR without significant finding. Patient given epinephrine , Decadron , Benadryl  and EpiPen  with some improvement in her  symptoms but became hypotensive despite 1 L fluid bolus.  Admission requested for hypotension and possible anaphylactic reaction.  Patient was started on antibiotics for possible cellulitis as well.  The next day, hypotension resolved.  No further chest pain or shortness of breath.  Localized swelling/bruising over left on exam.  See picture in the media for more.  Patient feels well and ready to go home.  She is discharged on p.o. doxycycline  for 7 days.  She already has EpiPen .  Discussed return precautions in details.  See individual problem list below for more.   Problems addressed during this hospitalization Possible anaphylactic reaction: Unclear if the left thigh swelling is because of insect bite.  The swelling and bruising looks old.  No erythema or increased warmth to touch.  No tenderness.  Dyspnea and chest tightness resolved.  Feels well and ready to go home.  Patient has EpiPen .  Discussed return precautions.  Possible left eye cellulitis: -Discharging on doxycycline  for 7 more days.  Hypotension/history of hypertension: Hypotension resolved.  BP elevated - Resume home medication on discharge.  Hyperlipidemia: Continue home statin   GERD: Continue home PPI   Hypothyroidism: Continue home Synthroid    Psoriatic arthritis: Patient injects methotrexate  once weekly  Anemia: Mild.  Medication management: Discontinued medications not taken by patient  There is no height or weight on file to calculate BMI.           Consultations:  None  Time spent 35  minutes  Vital signs Vitals:   12/07/23 0700 12/07/23 0800 12/07/23 0835 12/07/23 0944  BP: 125/66 (!) 140/94 (!) 140/94   Pulse: 88 99 89   Temp:    97.9 F (36.6 C)  Resp: 16 19 18    SpO2: 97% 97% 97%   TempSrc:    Oral     Discharge exam  GENERAL: No apparent distress.  Nontoxic. HEENT: MMM.  Vision and hearing grossly intact.  NECK: Supple.  No apparent JVD.  RESP:  No IWOB.  Fair aeration  bilaterally. CVS:  RRR. Heart sounds normal.  ABD/GI/GU: BS+. Abd soft, NTND.  MSK/EXT: Localized swelling and bruise about 2 inch in diameter over left thigh laterally.  Nontender.  No increased warmth to touch. SKIN: As above. NEURO: Awake and alert. Oriented appropriately.  No apparent focal neuro deficit. PSYCH: Calm. Normal affect.     Discharge Instructions Discharge Instructions     Discharge instructions   Complete by: As directed    It has been a pleasure taking care of you!  You were hospitalized due to possible anaphylactic reaction to insect bite for which you have been treated with EpiPen , steroid and antihistamine.  You have been started on antibiotics for possible infection as well.  We are discharging you more antibiotics to complete treatment course.  Please have your EpiPen  ready in case you have anaphylactic reaction again.  Follow-up with your primary care doctor in 1 to 2 weeks or sooner if needed.   Take care,   Increase activity slowly   Complete by: As directed       Allergies as of 12/07/2023       Reactions   Bee Venom Anaphylaxis   Codeine Hives, Nausea And Vomiting, Nausea Only   REACTION: severe Other Reaction(s): hives, nausea, vomitting   Other Anaphylaxis   Bee stings   Penicillin G Anaphylaxis   Other Reaction(s): SOB,swelling, rash   Penicillins Shortness Of Breath, Swelling, Rash   Gluten Meal Other (See Comments), Swelling   Inflammation  (this a trigger due to Psoriatic arthritis Other Reaction(s): Inflammation        Medication List     STOP taking these medications    potassium chloride  SA 20 MEQ tablet Commonly known as: KLOR-CON  M   scopolamine  1 MG/3DAYS Commonly known as: TRANSDERM-SCOP   sulfamethoxazole -trimethoprim  800-160 MG tablet Commonly known as: BACTRIM  DS   vitamin C 250 MG tablet Commonly known as: ASCORBIC ACID       TAKE these medications    atorvastatin  40 MG tablet Commonly known as:  LIPITOR Take 1 tablet (40 mg total) by mouth daily.   calcium -vitamin D  500-200 MG-UNIT tablet Commonly known as: OSCAL WITH D Take 1 tablet by mouth daily.   CRANBERRY PO Take by mouth daily.   doxycycline  100 MG capsule Commonly known as: VIBRAMYCIN  Take 1 capsule (100 mg total) by mouth 2 (two) times daily for 7 days.   EPINEPHrine  0.3 mg/0.3 mL Soaj injection Commonly known as: EPI-PEN INJECT INTO THE MIDDLE OF THE OUTER THIGH AND HOLD FOR 10 SECONDS AS NEEDED FOR SEVERE ALLERGIC REACTION THEN CALL 911 IF USED   Estradiol  10 MCG Tabs vaginal tablet Place 10 mcg vaginally 2 (two) times a week.   folic acid  1 MG tablet Commonly known as: FOLVITE  Take 2 tablets (2 mg total) by mouth daily.   levothyroxine  25 MCG tablet Commonly known as: SYNTHROID  TAKE 1 TABLET BY MOUTH DAILY BEFORE  BREAKFAST   methotrexate  50 MG/2ML injection INJECT 1 MILLILITER (CC) UNDER THE SKIN ONCE WEEKLY   metoprolol  succinate 50 MG 24 hr tablet Commonly known as: TOPROL -XL TAKE 1 TABLET BY MOUTH DAILY IMMEDIATELY FOLLOWING A MEAL   MONOJECT 1CC TB SYR 27GX1/2 27G X 1/2 1 ML Misc Generic drug: TUBERCULIN SYR 1CC/27GX1/2 USE AS DIRECTED   pantoprazole  40 MG tablet Commonly known as: PROTONIX  TAKE 1 TABLET BY MOUTH DAILY   PRESERVISION AREDS PO Take by mouth daily. Take one daily   RECLAST  IV Inject into the vein. First IV: 12/2021, most recent IV: 12/2022   REFRESH OP Apply to eye as needed.   Vevye 0.1 % Soln Generic drug: cycloSPORINE Apply 1 drop to eye 2 (two) times daily.   Vitamin D  50 MCG (2000 UT) tablet Take 0.5 tablets (1,000 Units total) by mouth daily. What changed:  medication strength how much to take         Procedures/Studies: None   DG Chest Port 1 View Result Date: 12/06/2023 CLINICAL DATA:  Shortness of breath. EXAM: PORTABLE CHEST 1 VIEW COMPARISON:  September 10, 2023 FINDINGS: The heart size and mediastinal contours are within normal limits. Both  lungs are clear. No acute osseous abnormalities are identified. IMPRESSION: No active disease. Electronically Signed   By: Suzen Dials M.D.   On: 12/06/2023 22:21       The results of significant diagnostics from this hospitalization (including imaging, microbiology, ancillary and laboratory) are listed below for reference.     Microbiology: No results found for this or any previous visit (from the past 240 hours).   Labs:  CBC: Recent Labs  Lab 12/06/23 2130 12/07/23 0500  WBC 10.3 8.1  HGB 13.1 11.6*  HCT 39.8 34.9*  MCV 91.9 91.8  PLT 405* 332   BMP &GFR Recent Labs  Lab 12/06/23 2130 12/07/23 0500  NA 139 142  K 3.8 3.7  CL 103 108  CO2 25 22  GLUCOSE 98 131*  BUN <5* <5*  CREATININE 0.50 0.47  CALCIUM  9.6 8.8*  MG  --  1.6*   CrCl cannot be calculated (Unknown ideal weight.). Liver & Pancreas: Recent Labs  Lab 12/06/23 2130  AST 24  ALT 16  ALKPHOS 104  BILITOT 0.3  PROT 7.6  ALBUMIN  4.4   No results for input(s): LIPASE, AMYLASE in the last 168 hours. No results for input(s): AMMONIA in the last 168 hours. Diabetic: No results for input(s): HGBA1C in the last 72 hours. No results for input(s): GLUCAP in the last 168 hours. Cardiac Enzymes: No results for input(s): CKTOTAL, CKMB, CKMBINDEX, TROPONINI in the last 168 hours. No results for input(s): PROBNP in the last 8760 hours. Coagulation Profile: No results for input(s): INR, PROTIME in the last 168 hours. Thyroid  Function Tests: No results for input(s): TSH, T4TOTAL, FREET4, T3FREE, THYROIDAB in the last 72 hours. Lipid Profile: No results for input(s): CHOL, HDL, LDLCALC, TRIG, CHOLHDL, LDLDIRECT in the last 72 hours. Anemia Panel: No results for input(s): VITAMINB12, FOLATE, FERRITIN, TIBC, IRON, RETICCTPCT in the last 72 hours. Urine analysis:    Component Value Date/Time   COLORURINE YELLOW 09/10/2023 1001   APPEARANCEUR  CLOUDY (A) 09/10/2023 1001   LABSPEC 1.013 09/10/2023 1001   LABSPEC 1.025 05/20/2017 1026   PHURINE 5.0 09/10/2023 1001   GLUCOSEU NEGATIVE 09/10/2023 1001   HGBUR NEGATIVE 09/10/2023 1001   BILIRUBINUR NEGATIVE 09/10/2023 1001   BILIRUBINUR negative 09/09/2020 1508   BILIRUBINUR n 01/17/2016 1134  KETONESUR NEGATIVE 09/10/2023 1001   PROTEINUR >=300 (A) 09/10/2023 1001   UROBILINOGEN 0.2 09/09/2020 1508   UROBILINOGEN 0.2 04/04/2008 1423   NITRITE NEGATIVE 09/10/2023 1001   LEUKOCYTESUR SMALL (A) 09/10/2023 1001   Sepsis Labs: Invalid input(s): PROCALCITONIN, LACTICIDVEN   SIGNED:  Ashvin Adelson T Leasha Goldberger, MD  [facility] 12/07/2023, 2:26 PM

## 2023-12-07 NOTE — H&P (Signed)
 History and Physical    April Cox FMW:[id number] DOB: 1955/05/30 DOA: 12/06/2023  DOS: the patient was seen and examined on 12/06/2023  PCP: [name], MD   Patient coming from: Home  I have personally briefly reviewed patient's old medical records in [facility] and CareEverywhere  HPI:   April Cox is a 68 y.o. year old female with medical history of hypertension, hyperlipidemia, psoriatic arthritis, hypothyroidism, tobacco use disorder, GERD presenting to the ED after localized left thigh swelling and shortness of breath after suspected insect bite.   Pt states she was sitting on her front porch and felt as there was an insect that was crawling on her and she tried to deflect it and then came inside to use the bathroom and noticed a large bump on her left thigh that was not previously present.  She was previously told if she suspected an insect bite to present to the ED as the EpiPen  may not be enough in that case.  She presented to the ED and became short of breath with some chest tightness.   On arrival to the ED patient was noted to be HDS stable.  Lab work and imaging obtained.  CBC overall unremarkable except for mild thrombocytosis and CMP unremarkable.  Chest x-ray without any acute findings.  Patient given epinephrine , Decadron , Benadryl  and EpiPen  with some improvement in her symptoms but continued to be hypotensive despite 1 L fluid bolus.  Given this, [facility] contacted for admission.  Review of Systems: As mentioned in the history of present illness. All other systems reviewed and are negative.   Past Medical History:  Diagnosis Date   Abnormal Pap smear of cervix    Allergy    seasonal   Anemia    history of anemia   Arthritis    Cataract    bilateral,removed   Diverticulosis of colon    Family history of adverse reaction to anesthesia    mother PONV   GERD (gastroesophageal reflux disease)    Heart murmur    asymptomatic per pt   History of  adenomatous polyp of colon    11-06-2009  tubullar   HPV in female    Hyperlipidemia    Hypertension    Hypothyroidism    Irritable bladder    per patient   Mild aortic valve stenosis    per echo 10-09-2010  ef 60-65%  valva area 1.48cm   Osteoporosis    Psoriasis    Psoriatic arthritis (HCC)    Dr. Dolphus   Wears glasses     Past Surgical History:  Procedure Laterality Date   ABDOMINAL HYSTERECTOMY     ANTERIOR LAT LUMBAR FUSION  12/25/2011   Procedure: ANTERIOR LATERAL LUMBAR FUSION 1 LEVEL;  Surgeon: Alm GORMAN Molt, MD;  Location: [facility];  Service: Neurosurgery;  Laterality: Left;  LUMBAR FOUR-FIVE   BLADDER SUSPENSION N/A 12/19/2014   Procedure: TRANSVAGINAL TAPE (TVT) PROCEDURE;  Surgeon: Jon Rummer, MD;  Location: [facility];  Service: Gynecology;  Laterality: N/A;   CARPAL TUNNEL RELEASE Right 04/2013   CATARACT EXTRACTION, BILATERAL     05/2020, 06/2020   CERVICAL CONIZATION W/BX N/A 08/28/2014   Procedure: CONIZATION CERVIX WITH BIOPSY;  Surgeon: Rome Rigg, MD;  Location: [facility];  Service: Gynecology;  Laterality: N/A;   COLONOSCOPY  11/06/2009   COLONOSCOPY  08/2019   COLONOSCOPY  10/2022   CYSTO N/A 12/19/2014   Procedure: CYSTO;  Surgeon: Jon Rummer, MD;  Location: [facility];  Service: Gynecology;  Laterality: N/A;   DENTAL SURGERY     HYSTEROSCOPY WITH D & C  04/04/2008   KNEE ARTHROPLASTY     LUMBAR PERCUTANEOUS PEDICLE SCREW 1 LEVEL  12/25/2011   Procedure: LUMBAR PERCUTANEOUS PEDICLE SCREW 1 LEVEL;  Surgeon: Alm GORMAN Molt, MD;  Location: [facility];  Service: Neurosurgery;  Laterality: Left;  LUMBAR FOUR-FIVE   PERONEAL NERVE DECOMPRESSION Left 12/27/2009   for neuropathy with  foot drop   POLYPECTOMY     TRANSTHORACIC ECHOCARDIOGRAM  10/09/2010   mild LVH,  ef 60-65%,  grade I diastolic dysfunction,  very mild AV stenosis (area 1.48cm/S/2(VTI) with no regurg./  mild MV calcification without stenosis or regur. /  trivial TR    VAGINAL HYSTERECTOMY Bilateral 12/19/2014   Procedure: TOTAL VAGINAL HYSTERECTOMY WITH BILATERAL SALPINGECTOMY;  Surgeon: Rome Rigg, MD;  Location: [facility];  Service: Gynecology;  Laterality: Bilateral;     Allergies  Allergen Reactions   Bee Venom Anaphylaxis   Codeine Hives, Nausea And Vomiting and Nausea Only    REACTION: severe  Other Reaction(s): hives, nausea, vomitting   Other Anaphylaxis    Bee stings   Penicillin G Anaphylaxis    Other Reaction(s): SOB,swelling, rash   Penicillins Shortness Of Breath, Swelling and Rash   Gluten Meal Other (See Comments) and Swelling    Inflammation  (this a trigger due to Psoriatic arthritis  Other Reaction(s): Inflammation    Family History  Problem Relation Age of Onset   Colon polyps Mother    Arthritis Mother        OA    Psoriasis Mother    Other Father        accidental death/MVA at age 51   Hypertension Brother    Other Brother        1 brother drowned   Cancer Brother    Esophageal cancer Brother    Brain cancer Brother    Hypertension Maternal Grandmother    Diabetes Maternal Grandmother    Hypertension Maternal Grandfather    Heart disease Neg Hx    Colon cancer Neg Hx    Stomach cancer Neg Hx    Crohn's disease Neg Hx    Rectal cancer Neg Hx    Ulcerative colitis Neg Hx     Prior to Admission medications   Medication Sig Start Date End Date Taking? Authorizing Provider  atorvastatin  (LIPITOR) 40 MG tablet Take 1 tablet (40 mg total) by mouth daily. 04/28/23   [name], MD  calcium -vitamin D  (OSCAL WITH D) 500-200 MG-UNIT per tablet Take 1 tablet by mouth daily.    [provider]  Cholecalciferol (VITAMIN D  PO) Take 500 mg by mouth daily.     [provider]  CRANBERRY PO Take by mouth daily.    [provider]  EPINEPHrine  0.3 mg/0.3 mL IJ SOAJ injection INJECT INTO THE MIDDLE OF THE OUTER THIGH AND HOLD FOR 10 SECONDS AS NEEDED FOR SEVERE ALLERGIC REACTION THEN CALL  911 IF USED 12/29/22   [name], MD  Estradiol  10 MCG TABS vaginal tablet Place vaginally. 05/19/22   [provider]  folic acid  (FOLVITE ) 1 MG tablet Take 2 tablets (2 mg total) by mouth daily. 11/08/23   Cheryl Waddell HERO, PA-C  levothyroxine  (SYNTHROID ) 25 MCG tablet TAKE 1 TABLET BY MOUTH DAILY BEFORE BREAKFAST 04/28/23   [name], MD  methotrexate  50 MG/2ML injection INJECT 1 MILLILITER (CC) UNDER THE SKIN  ONCE WEEKLY 10/28/23   [name], MD  metoprolol  succinate (TOPROL -XL) 50 MG 24 hr tablet TAKE 1 TABLET BY MOUTH DAILY IMMEDIATELY FOLLOWING A MEAL 04/28/23   [name], MD  MONOJECT 1CC TB SYR 27GX1/2 27G X 1/2 1 ML MISC USE AS DIRECTED 11/12/23   [name], MD  Multiple Vitamins-Minerals (PRESERVISION AREDS PO) Take by mouth daily. Take one daily    [provider]  pantoprazole  (PROTONIX ) 40 MG tablet TAKE 1 TABLET BY MOUTH DAILY 09/29/23   Lalonde, John C, MD  Polyvinyl Alcohol-Povidone (REFRESH OP) Apply to eye as needed.    [provider]  potassium chloride  SA (KLOR-CON  M) 20 MEQ tablet Take 1 tablet (20 mEq total) by mouth 2 (two) times daily for 5 days. Patient not taking: Reported on 10/12/2023 09/22/23 10/12/23  [name], MD  scopolamine  (TRANSDERM-SCOP) 1 MG/3DAYS Place 1 patch (1 mg total) onto the skin every 3 (three) days. 11/01/23   [name], MD  sulfamethoxazole -trimethoprim  (BACTRIM  DS) 800-160 MG tablet Take 1 tablet by mouth 2 (two) times daily. Patient not taking: Reported on 10/12/2023 09/07/23   Lalonde, John C, MD  VEVYE 0.1 % SOLN Apply 1 drop to eye 2 (two) times daily.    [provider]  vitamin C (ASCORBIC ACID) 250 MG tablet Take 250 mg by mouth daily.    [provider]  Zoledronic  Acid (RECLAST  IV) Inject into the vein. First IV: 12/2021, most recent IV: 12/2022 Patient not taking: Reported on 10/12/2023    [provider]    Social History:  reports that she has  been smoking cigarettes. She has a 4 pack-year smoking history. She has been exposed to tobacco smoke. She has never used smokeless tobacco. She reports that she does not currently use alcohol. She reports that she does not use drugs. Lives with husband Tobacco-smokes 3/4 pack/day EtOH- Denies use.  Illicit drug use- denies use.  IADLs/ADLs- can perform independently at baseline    Physical Exam: Vitals:   12/07/23 0101 12/07/23 0240 12/07/23 0400 12/07/23 0455  BP:  (!) 108/59 118/71   Pulse: 75 96 79   Resp: 16 20 11    Temp: (!) 97.5 F (36.4 C)   97.8 F (36.6 C)  TempSrc: Oral   Oral  SpO2: 97% 95% 97%      Physical Exam General: NAD HENT: NCAT Lungs: CTAB, no wheeze, rhonchi or rales.  Cardiovascular: Normal heart sounds Abdomen: No TTP, normal bowel sounds MSK: No asymmetry or muscle atrophy. Left thigh with moderate size bump that has erythema present. Slight TTP of the lesion. Neuro: Alert and oriented x4. CN grossly intact Psych: Normal mood and normal affect    Labs on Admission: I have personally reviewed following labs and imaging studies  CBC: Recent Labs  Lab 12/06/23 2130  WBC 10.3  HGB 13.1  HCT 39.8  MCV 91.9  PLT 405*   Basic Metabolic Panel: Recent Labs  Lab 12/06/23 2130  NA 139  K 3.8  CL 103  CO2 25  GLUCOSE 98  BUN <5*  CREATININE 0.50  CALCIUM  9.6   GFR: CrCl cannot be calculated (Unknown ideal weight.). Liver Function Tests: Recent Labs  Lab 12/06/23 2130  AST 24  ALT 16  ALKPHOS 104  BILITOT 0.3  PROT 7.6  ALBUMIN  4.4   No results for input(s): LIPASE, AMYLASE in the last 168 hours. No results for input(s): AMMONIA in the last 168 hours. Coagulation Profile: No  results for input(s): INR, PROTIME in the last 168 hours. Cardiac Enzymes: No results for input(s): CKTOTAL, CKMB, CKMBINDEX, TROPONINI, TROPONINIHS in the last 168 hours. BNP (last 3 results) No results for input(s): BNP in the last  8760 hours. HbA1C: No results for input(s): HGBA1C in the last 72 hours. CBG: No results for input(s): GLUCAP in the last 168 hours. Lipid Profile: No results for input(s): CHOL, HDL, LDLCALC, TRIG, CHOLHDL, LDLDIRECT in the last 72 hours. Thyroid  Function Tests: No results for input(s): TSH, T4TOTAL, FREET4, T3FREE, THYROIDAB in the last 72 hours. Anemia Panel: No results for input(s): VITAMINB12, FOLATE, FERRITIN, TIBC, IRON, RETICCTPCT in the last 72 hours. Urine analysis:    Component Value Date/Time   COLORURINE YELLOW 09/10/2023 1001   APPEARANCEUR CLOUDY (A) 09/10/2023 1001   LABSPEC 1.013 09/10/2023 1001   LABSPEC 1.025 05/20/2017 1026   PHURINE 5.0 09/10/2023 1001   GLUCOSEU NEGATIVE 09/10/2023 1001   HGBUR NEGATIVE 09/10/2023 1001   BILIRUBINUR NEGATIVE 09/10/2023 1001   BILIRUBINUR negative 09/09/2020 1508   BILIRUBINUR n 01/17/2016 1134   KETONESUR NEGATIVE 09/10/2023 1001   PROTEINUR >=300 (A) 09/10/2023 1001   UROBILINOGEN 0.2 09/09/2020 1508   UROBILINOGEN 0.2 04/04/2008 1423   NITRITE NEGATIVE 09/10/2023 1001   LEUKOCYTESUR SMALL (A) 09/10/2023 1001    Radiological Exams on Admission: I have personally reviewed images DG Chest Port 1 View Result Date: 12/06/2023 CLINICAL DATA:  Shortness of breath. EXAM: PORTABLE CHEST 1 VIEW COMPARISON:  September 10, 2023 FINDINGS: The heart size and mediastinal contours are within normal limits. Both lungs are clear. No acute osseous abnormalities are identified. IMPRESSION: No active disease. Electronically Signed   By: Suzen Dials M.D.   On: 12/06/2023 22:21    EKG: My personal interpretation of EKG shows: Sinus rhythm without any acute ST changes    Assessment/Plan Principal Problem:   Anaphylactic reaction Active Problems:   Hyperlipidemia   Hypothyroidism   Psoriatic arthritis (HCC)   Essential hypertension   Gastroesophageal reflux disease without  esophagitis  Patient with likely anaphylactic reaction to bee.  Given some skin changes, unable to see puncture site.  But given the shortness of breath, hypotension and response to epinephrine  anaphylactic reaction is very likely.  Patient admitted given continued hypotension which required another epinephrine  injection.  The site does have erythema and is consistent with cellulitis which can be secondary to the insect bite.  Will start patient on oral antibiotics.  Hypertension: Holding home med given concern for hypotension  Hyperlipidemia: Continue home statin  GERD: Continue home PPI  Hypothyroidism: Continue home Synthroid   Psoriatic arthritis: Patient injects methotrexate  once weekly  VTE prophylaxis:  Lovenox   Diet: Regular Code Status:  Full Code Telemetry:  Admission status: Observation, Telemetry bed Patient is from: Home Anticipated d/c is to: Home Anticipated d/c is in: 1-2 days   Family Communication: Updated at bedside  Consults called: None   Severity of Illness: The appropriate patient status for this patient is OBSERVATION. Observation status is judged to be reasonable and necessary in order to provide the required intensity of service to ensure the patient's safety. The patient's presenting symptoms, physical exam findings, and initial radiographic and laboratory data in the context of their medical condition is felt to place them at decreased risk for further clinical deterioration. Furthermore, it is anticipated that the patient will be medically stable for discharge from the hospital within 2 midnights of admission.    Morene Bathe, MD [facility]. [facility]

## 2023-12-08 LAB — MISC LABCORP TEST (SEND OUT): Labcorp test code: 83935

## 2024-02-22 ENCOUNTER — Other Ambulatory Visit: Payer: Self-pay | Admitting: Obstetrics and Gynecology

## 2024-02-22 DIAGNOSIS — Z1231 Encounter for screening mammogram for malignant neoplasm of breast: Secondary | ICD-10-CM

## 2024-02-24 ENCOUNTER — Other Ambulatory Visit: Payer: Self-pay | Admitting: Family Medicine

## 2024-02-24 DIAGNOSIS — E038 Other specified hypothyroidism: Secondary | ICD-10-CM

## 2024-03-16 ENCOUNTER — Ambulatory Visit: Admitting: Rheumatology

## 2024-05-01 ENCOUNTER — Ambulatory Visit: Payer: Self-pay | Admitting: Family Medicine
